# Patient Record
Sex: Male | Born: 1935 | Race: Black or African American | Hispanic: No | Smoking: Former smoker
Health system: Southern US, Community
[De-identification: ages and names within clinical notes are randomized; demographics above are authoritative.]

## PROBLEM LIST (undated history)

## (undated) DIAGNOSIS — I472 Ventricular tachycardia: Secondary | ICD-10-CM

## (undated) DIAGNOSIS — I2510 Atherosclerotic heart disease of native coronary artery without angina pectoris: Secondary | ICD-10-CM

## (undated) DIAGNOSIS — I350 Nonrheumatic aortic (valve) stenosis: Secondary | ICD-10-CM

## (undated) DIAGNOSIS — R7881 Bacteremia: Secondary | ICD-10-CM

## (undated) DIAGNOSIS — I639 Cerebral infarction, unspecified: Secondary | ICD-10-CM

## (undated) DIAGNOSIS — I82409 Acute embolism and thrombosis of unspecified deep veins of unspecified lower extremity: Secondary | ICD-10-CM

## (undated) DIAGNOSIS — E119 Type 2 diabetes mellitus without complications: Secondary | ICD-10-CM

## (undated) DIAGNOSIS — I5032 Chronic diastolic (congestive) heart failure: Secondary | ICD-10-CM

## (undated) DIAGNOSIS — J9610 Chronic respiratory failure, unspecified whether with hypoxia or hypercapnia: Secondary | ICD-10-CM

## (undated) DIAGNOSIS — I272 Pulmonary hypertension, unspecified: Secondary | ICD-10-CM

## (undated) DIAGNOSIS — J449 Chronic obstructive pulmonary disease, unspecified: Secondary | ICD-10-CM

## (undated) DIAGNOSIS — I10 Essential (primary) hypertension: Secondary | ICD-10-CM

## (undated) DIAGNOSIS — D649 Anemia, unspecified: Secondary | ICD-10-CM

## (undated) DIAGNOSIS — G4733 Obstructive sleep apnea (adult) (pediatric): Secondary | ICD-10-CM

## (undated) DIAGNOSIS — J189 Pneumonia, unspecified organism: Secondary | ICD-10-CM

## (undated) DIAGNOSIS — C61 Malignant neoplasm of prostate: Secondary | ICD-10-CM

## (undated) DIAGNOSIS — M1990 Unspecified osteoarthritis, unspecified site: Secondary | ICD-10-CM

## (undated) DIAGNOSIS — E7800 Pure hypercholesterolemia, unspecified: Secondary | ICD-10-CM

## (undated) DIAGNOSIS — R42 Dizziness and giddiness: Secondary | ICD-10-CM

## (undated) DIAGNOSIS — B9561 Methicillin susceptible Staphylococcus aureus infection as the cause of diseases classified elsewhere: Secondary | ICD-10-CM

## (undated) DIAGNOSIS — I4729 Other ventricular tachycardia: Secondary | ICD-10-CM

## (undated) DIAGNOSIS — K219 Gastro-esophageal reflux disease without esophagitis: Secondary | ICD-10-CM

## (undated) DIAGNOSIS — N182 Chronic kidney disease, stage 2 (mild): Secondary | ICD-10-CM

## (undated) HISTORY — PX: CARDIAC CATHETERIZATION: SHX172

## (undated) HISTORY — PX: COLON SURGERY: SHX602

## (undated) HISTORY — DX: Atherosclerotic heart disease of native coronary artery without angina pectoris: I25.10

## (undated) HISTORY — PX: PROSTATE SURGERY: SHX751

## (undated) HISTORY — PX: EYE SURGERY: SHX253

## (undated) HISTORY — DX: Pure hypercholesterolemia, unspecified: E78.00

## (undated) HISTORY — DX: Essential (primary) hypertension: I10

## (undated) HISTORY — DX: Malignant neoplasm of prostate: C61

## (undated) HISTORY — DX: Type 2 diabetes mellitus without complications: E11.9

## (undated) HISTORY — DX: Obstructive sleep apnea (adult) (pediatric): G47.33

---

## 1998-10-23 ENCOUNTER — Other Ambulatory Visit: Admission: RE | Admit: 1998-10-23 | Discharge: 1998-10-23 | Payer: Self-pay | Admitting: Gastroenterology

## 1999-01-31 ENCOUNTER — Encounter: Payer: Self-pay | Admitting: Emergency Medicine

## 1999-01-31 ENCOUNTER — Emergency Department (HOSPITAL_COMMUNITY): Admission: EM | Admit: 1999-01-31 | Discharge: 1999-01-31 | Payer: Self-pay | Admitting: Emergency Medicine

## 1999-03-28 ENCOUNTER — Inpatient Hospital Stay (HOSPITAL_COMMUNITY): Admission: AD | Admit: 1999-03-28 | Discharge: 1999-04-05 | Payer: Self-pay | Admitting: Cardiovascular Disease

## 1999-03-28 ENCOUNTER — Encounter: Payer: Self-pay | Admitting: Surgery

## 1999-03-29 ENCOUNTER — Encounter: Payer: Self-pay | Admitting: Surgery

## 1999-03-29 ENCOUNTER — Encounter: Payer: Self-pay | Admitting: Cardiovascular Disease

## 1999-03-29 HISTORY — PX: CORONARY ARTERY BYPASS GRAFT: SHX141

## 1999-03-30 ENCOUNTER — Encounter: Payer: Self-pay | Admitting: Cardiovascular Disease

## 1999-03-31 ENCOUNTER — Encounter: Payer: Self-pay | Admitting: Surgery

## 1999-04-12 ENCOUNTER — Encounter: Admission: RE | Admit: 1999-04-12 | Discharge: 1999-07-11 | Payer: Self-pay | Admitting: Pulmonary Disease

## 1999-05-01 ENCOUNTER — Encounter (HOSPITAL_COMMUNITY): Admission: RE | Admit: 1999-05-01 | Discharge: 1999-07-23 | Payer: Self-pay | Admitting: Cardiovascular Disease

## 1999-07-24 ENCOUNTER — Encounter (HOSPITAL_COMMUNITY): Admission: RE | Admit: 1999-07-24 | Discharge: 1999-10-22 | Payer: Self-pay | Admitting: Cardiovascular Disease

## 2001-01-21 ENCOUNTER — Encounter: Payer: Self-pay | Admitting: Urology

## 2001-01-21 ENCOUNTER — Ambulatory Visit (HOSPITAL_COMMUNITY): Admission: RE | Admit: 2001-01-21 | Discharge: 2001-01-21 | Payer: Self-pay | Admitting: Urology

## 2001-02-26 ENCOUNTER — Ambulatory Visit (HOSPITAL_COMMUNITY): Admission: RE | Admit: 2001-02-26 | Discharge: 2001-02-26 | Payer: Self-pay | Admitting: Neurology

## 2001-02-26 ENCOUNTER — Encounter: Payer: Self-pay | Admitting: Neurology

## 2002-03-31 ENCOUNTER — Encounter: Payer: Self-pay | Admitting: Chiropractic Medicine

## 2002-03-31 ENCOUNTER — Encounter: Admission: RE | Admit: 2002-03-31 | Discharge: 2002-03-31 | Payer: Self-pay | Admitting: Chiropractic Medicine

## 2002-04-06 ENCOUNTER — Encounter: Payer: Self-pay | Admitting: Chiropractic Medicine

## 2002-04-06 ENCOUNTER — Encounter: Admission: RE | Admit: 2002-04-06 | Discharge: 2002-04-06 | Payer: Self-pay | Admitting: Chiropractic Medicine

## 2003-09-05 ENCOUNTER — Encounter: Payer: Self-pay | Admitting: Chiropractic Medicine

## 2003-09-05 ENCOUNTER — Encounter: Admission: RE | Admit: 2003-09-05 | Discharge: 2003-09-05 | Payer: Self-pay | Admitting: Chiropractic Medicine

## 2006-05-06 ENCOUNTER — Ambulatory Visit: Payer: Self-pay | Admitting: Gastroenterology

## 2006-05-31 ENCOUNTER — Emergency Department (HOSPITAL_COMMUNITY): Admission: EM | Admit: 2006-05-31 | Discharge: 2006-05-31 | Payer: Self-pay

## 2006-06-04 ENCOUNTER — Ambulatory Visit: Payer: Self-pay | Admitting: Gastroenterology

## 2006-06-11 ENCOUNTER — Ambulatory Visit: Payer: Self-pay | Admitting: Gastroenterology

## 2008-08-16 ENCOUNTER — Encounter: Admission: RE | Admit: 2008-08-16 | Discharge: 2008-08-16 | Payer: Self-pay | Admitting: Orthopedic Surgery

## 2008-08-18 ENCOUNTER — Ambulatory Visit (HOSPITAL_BASED_OUTPATIENT_CLINIC_OR_DEPARTMENT_OTHER): Admission: RE | Admit: 2008-08-18 | Discharge: 2008-08-18 | Payer: Self-pay | Admitting: Orthopedic Surgery

## 2008-08-18 ENCOUNTER — Encounter (INDEPENDENT_AMBULATORY_CARE_PROVIDER_SITE_OTHER): Payer: Self-pay | Admitting: Orthopedic Surgery

## 2009-07-21 ENCOUNTER — Ambulatory Visit (HOSPITAL_BASED_OUTPATIENT_CLINIC_OR_DEPARTMENT_OTHER): Admission: RE | Admit: 2009-07-21 | Discharge: 2009-07-21 | Payer: Self-pay | Admitting: Orthopedic Surgery

## 2011-03-31 LAB — POCT I-STAT, CHEM 8
BUN: 20 mg/dL (ref 6–23)
Calcium, Ion: 1.2 mmol/L (ref 1.12–1.32)
Chloride: 104 mEq/L (ref 96–112)
Creatinine, Ser: 1.1 mg/dL (ref 0.4–1.5)
Glucose, Bld: 150 mg/dL — ABNORMAL HIGH (ref 70–99)
HCT: 35 % — ABNORMAL LOW (ref 39.0–52.0)
Hemoglobin: 11.9 g/dL — ABNORMAL LOW (ref 13.0–17.0)
Potassium: 4.3 mEq/L (ref 3.5–5.1)
Sodium: 139 mEq/L (ref 135–145)
TCO2: 27 mmol/L (ref 0–100)

## 2011-03-31 LAB — GLUCOSE, CAPILLARY
Glucose-Capillary: 142 mg/dL — ABNORMAL HIGH (ref 70–99)
Glucose-Capillary: 62 mg/dL — ABNORMAL LOW (ref 70–99)
Glucose-Capillary: 78 mg/dL (ref 70–99)

## 2011-03-31 LAB — ANAEROBIC CULTURE: Gram Stain: NONE SEEN

## 2011-03-31 LAB — WOUND CULTURE: Gram Stain: NONE SEEN

## 2011-03-31 LAB — POCT HEMOGLOBIN-HEMACUE: Hemoglobin: 10.4 g/dL — ABNORMAL LOW (ref 13.0–17.0)

## 2011-05-07 NOTE — Op Note (Signed)
NAMEKAYODE, PETION              ACCOUNT NO.:  [id number]   MEDICAL RECORD NO.:  [id number]          PATIENT TYPE:  AMB   LOCATION:  DSC                          FACILITY:  [facility]   PHYSICIAN:  Cindee Salt, M.D.       DATE OF BIRTH:  [birth year]   DATE OF PROCEDURE:  07/21/2009  DATE OF DISCHARGE:                               OPERATIVE REPORT   PREOPERATIVE DIAGNOSIS:  Foreign body, possible infection, proximal  interphalangeal joint of right index finger.   POSTOPERATIVE DIAGNOSIS:  Foreign body, possible infection, proximal  interphalangeal joint of right index finger.   OPERATION:  Removal of foreign body proximal interphalangeal joint right  index finger.   SURGEON:  Cindee Salt, MD   ASSISTANT:  Carolyne Fiscal R.N.   ANESTHESIA:  MAC.   ANESTHESIOLOGIST:  Dr. Jean Rosenthal.   HISTORY:  The patient is a 77 year old male with a history of a foreign  body piece of wood from a weed in proximal interphalangeal area of his  right index finger.  This has been swollen.  He has attempted to remove  it.  He states that he has removed some of the foreign material, it is  not certain whether all of it was relieved.  He has brought to the  operating room for the possibility of removal of foreign body, incision  and drainage of the PIP joint dictated by depth of the wound.  He is  aware of risks and complications including infection, injury to  arteries, nerves, tendons, and stiffness to the joint.  Preoperative  area the patient is seen.  The extremity marked by both the patient and  surgeon.   PROCEDURE:  The patient was brought to the operating room where a local  anesthetic MAC was given with 0.25% Marcaine, 1% Xylocaine both without  epinephrine.  He was prepped using ChloraPrep, supine position with the  right arm free.  A Penrose drain was used for tourniquet control after  draping and time-out taken and 3-minute dry time.  The incision was made  directly over the entrance wound and carried  down through subcutaneous  tissue.  A large piece of foreign material was immediately encountered  appearing to be a thorn.  This was removed followed down into the PIP  joint.  This area was opened.  Cultures taken for both aerobic and  anaerobic cultures.  The joint copiously irrigated with saline.  A  Penrose drain placed.  A sterile compressive dressing and splint was  applied to  the finger.  The patient tolerated the procedure well.  On removal of  the Penrose, the tip of the finger immediately pinked.  He was taken to  the recovery room for observation in satisfactory condition.  He will be  discharged home to return to the [facility] of [place] in 4 days on  Vicodin and Septra DS.           ______________________________  Cindee Salt, M.D.     GK/MEDQ  D:  07/21/2009  T:  07/22/2009  Job:  [id number]

## 2011-05-07 NOTE — Op Note (Signed)
[name], Douglas Carrillo              ACCOUNT NO.:  [id number]   MEDICAL RECORD NO.:  [id number]          PATIENT TYPE:  AMB   LOCATION:  DSC                          FACILITY:  [facility]   PHYSICIAN:  Cindee Salt, M.D.       DATE OF BIRTH:  Feb 07, 1928   DATE OF PROCEDURE:  08/18/2008  DATE OF DISCHARGE:                               OPERATIVE REPORT   PREOPERATIVE DIAGNOSIS:  Stenosing tenosynovitis, left thumb with flexor  sheath cyst.   POSTOPERATIVE DIAGNOSIS:  Stenosing tenosynovitis, left thumb with  flexor sheath cyst.   OPERATION:  Release A1 pulley with excision of flexor sheath cyst, left  thumb.   SURGEON:  Cindee Salt, MD   ASSISTANT:  Carolyne Fiscal, RN   ANESTHESIA:  Forearm-based IV regional.   HISTORY:  The patient is a 77 year old male with a history of triggering  of his left thumb.  This has developed a cyst.  He is desirous of having  this surgically released and excised.  He is aware of risks and  complications including infection, recurrence, injury to arteries,  nerves, tendons, and incomplete relief of symptoms, and dystrophy.  In  the preoperative area, the patient is seen.  The extremity marked by  both the patient and surgeon.  Antibiotic given.  Questions encouraged  and answered.   PROCEDURE:  The patient is brought to the operating room where a forearm-  based IV regional anesthetic was carried out without difficulty and was  prepped using the DuraPrep in supine position and left arm free under  the direction of Dr. Sampson Goon.  A time-out was taken.  A transverse  incision was made over the A1 pulley of the left thumb and carried down  through the subcutaneous tissue.  Bleeders were electrocauterized.  Neurovascular structures were identified and protected and retractors  placed.  A cyst was immediately encountered at the proximal aspect of  the A1 pulley.  This was excised and sent to pathology with small  portion of the pulley.  Pulley was then released on  the radial aspect  protecting the oblique pulley.  The thumb placed through a full range  motion and no further triggering was evident.  The wound was irrigated  and skin closed with interrupted 5-0 Vicryl Rapide suture.  Sterile  compressive dressing was applied.  The patient tolerated the procedure  well and was taken to the recovery room for observation in satisfactory  condition.  He will be discharged home to return to the [facility] of  [place] in approximately 1 week on Vicodin.           ______________________________  Cindee Salt, M.D.     GK/MEDQ  D:  08/18/2008  T:  08/19/2008  Job:  [id number]   cc:   Veverly Fells. Al[place]imer, M.D.

## 2012-04-28 HISTORY — PX: NM MYOCAR PERF WALL MOTION: HXRAD629

## 2012-10-29 ENCOUNTER — Ambulatory Visit: Payer: Medicare Other | Attending: Otolaryngology | Admitting: Rehabilitative and Restorative Service Providers"

## 2012-10-29 DIAGNOSIS — IMO0001 Reserved for inherently not codable concepts without codable children: Secondary | ICD-10-CM | POA: Insufficient documentation

## 2012-10-29 DIAGNOSIS — R42 Dizziness and giddiness: Secondary | ICD-10-CM | POA: Insufficient documentation

## 2012-11-02 ENCOUNTER — Ambulatory Visit: Payer: Medicare Other | Admitting: Rehabilitative and Restorative Service Providers"

## 2012-11-04 ENCOUNTER — Ambulatory Visit: Payer: Medicare Other | Admitting: Rehabilitative and Restorative Service Providers"

## 2012-11-04 ENCOUNTER — Encounter: Payer: TRICARE For Life (TFL) | Admitting: Rehabilitative and Restorative Service Providers"

## 2012-11-11 ENCOUNTER — Ambulatory Visit: Payer: Medicare Other | Admitting: Rehabilitative and Restorative Service Providers"

## 2012-11-13 ENCOUNTER — Encounter: Payer: TRICARE For Life (TFL) | Admitting: Rehabilitative and Restorative Service Providers"

## 2013-04-16 ENCOUNTER — Other Ambulatory Visit (HOSPITAL_COMMUNITY): Payer: Self-pay | Admitting: Cardiovascular Disease

## 2013-04-16 DIAGNOSIS — I350 Nonrheumatic aortic (valve) stenosis: Secondary | ICD-10-CM

## 2013-05-04 ENCOUNTER — Ambulatory Visit (HOSPITAL_COMMUNITY)
Admission: RE | Admit: 2013-05-04 | Discharge: 2013-05-04 | Disposition: A | Discharge status: Home / Self Care | Payer: Medicare Other | Source: Ambulatory Visit | Attending: Internal Medicine | Admitting: Internal Medicine

## 2013-05-04 DIAGNOSIS — I359 Nonrheumatic aortic valve disorder, unspecified: Secondary | ICD-10-CM | POA: Insufficient documentation

## 2013-05-04 DIAGNOSIS — I350 Nonrheumatic aortic (valve) stenosis: Secondary | ICD-10-CM

## 2013-05-04 NOTE — Progress Notes (Signed)
2D Echo Performed 05/04/2013    Clearence Ped, RCS

## 2013-06-09 ENCOUNTER — Other Ambulatory Visit: Payer: Self-pay | Admitting: Pharmacist Clinician (PhC)/ Clinical Pharmacy Specialist

## 2013-06-09 MED ORDER — ISOSORBIDE MONONITRATE ER 60 MG PO TB24: ORAL_TABLET | ORAL | Status: DC

## 2013-06-09 MED ORDER — METOPROLOL SUCCINATE ER 100 MG PO TB24: ORAL_TABLET | ORAL | Status: DC

## 2013-07-07 ENCOUNTER — Other Ambulatory Visit (HOSPITAL_COMMUNITY): Payer: Self-pay | Admitting: Cardiovascular Disease

## 2013-07-08 NOTE — Telephone Encounter (Signed)
Rx was sent to pharmacy electronically. 

## 2013-07-09 ENCOUNTER — Telehealth: Payer: Self-pay | Admitting: Cardiovascular Disease

## 2013-07-09 NOTE — Telephone Encounter (Signed)
Needs refill on Lasix----needs 90 day supply with 3 refills  [facility].-------PATIENT STATES THAT HE HAS NOT HEARD FROM HIS ECHO THAT WAS DONE ON 05/04/2013.

## 2013-07-09 NOTE — Telephone Encounter (Signed)
Please review echo

## 2013-07-09 NOTE — Telephone Encounter (Signed)
Returned call.  Pt informed refill completed yesterday evening.  Stated he received a call earlier yesterday.  Pt also informed Dr. Tresa Endo will be notified that he has not received his results.  Pt verbalized understanding and agreed w/ plan.  Stated Burna Mortimer usually calls him.    Message forwarded to Dr. Pierre Bali, CMA.

## 2013-07-12 NOTE — Telephone Encounter (Signed)
Nl lvFXN; mild diastolic dysfunction; at least mod AS,  Med RX ;  Important to note if any change in Symptoms

## 2013-11-12 ENCOUNTER — Other Ambulatory Visit: Payer: Self-pay | Admitting: Cardiovascular Disease

## 2013-11-12 NOTE — Telephone Encounter (Signed)
Rx was sent to pharmacy electronically. 

## 2013-11-27 ENCOUNTER — Other Ambulatory Visit (HOSPITAL_COMMUNITY): Payer: Self-pay | Admitting: Cardiovascular Disease

## 2013-11-30 ENCOUNTER — Ambulatory Visit (INDEPENDENT_AMBULATORY_CARE_PROVIDER_SITE_OTHER): Payer: Medicare Other | Admitting: Cardiovascular Disease

## 2013-11-30 ENCOUNTER — Encounter: Payer: Self-pay | Admitting: Cardiovascular Disease

## 2013-11-30 VITALS — BP 110/64 | HR 77 | Ht 72.0 in | Wt 212.2 lb

## 2013-11-30 DIAGNOSIS — E119 Type 2 diabetes mellitus without complications: Secondary | ICD-10-CM

## 2013-11-30 DIAGNOSIS — I10 Essential (primary) hypertension: Secondary | ICD-10-CM

## 2013-11-30 DIAGNOSIS — I359 Nonrheumatic aortic valve disorder, unspecified: Secondary | ICD-10-CM

## 2013-11-30 DIAGNOSIS — I350 Nonrheumatic aortic (valve) stenosis: Secondary | ICD-10-CM

## 2013-11-30 DIAGNOSIS — I25810 Atherosclerosis of coronary artery bypass graft(s) without angina pectoris: Secondary | ICD-10-CM

## 2013-11-30 DIAGNOSIS — E785 Hyperlipidemia, unspecified: Secondary | ICD-10-CM

## 2013-11-30 DIAGNOSIS — I2510 Atherosclerotic heart disease of native coronary artery without angina pectoris: Secondary | ICD-10-CM

## 2013-11-30 DIAGNOSIS — I4510 Unspecified right bundle-branch block: Secondary | ICD-10-CM

## 2013-11-30 MED ORDER — CLOPIDOGREL BISULFATE 75 MG PO TABS: ORAL_TABLET | ORAL | Status: DC

## 2013-11-30 MED ORDER — FUROSEMIDE 40 MG PO TABS: ORAL_TABLET | ORAL | Status: DC

## 2013-11-30 NOTE — Progress Notes (Signed)
Patient ID: Douglas Carrillo, male   DOB: 20-Nov-1928, 79 y.o.   MRN: [id number]     HPI: [name] is a 79 y.o. male who presents to the office today for one-year cardiology evaluation.  [name] has established coronary artery disease in April 2000 underwent CABG surgery by Dr. Lavinia Sharps with a LIMA to the LAD, vein to the vaginal, a vein to the intermediate, and vein to the PDA. In January 2007 cardiac catheterization was done due to exertional dyspnea and mild abnormality on nuclear imaging. This revealed patent vein grafts to the diagonal, marginal, and PDA, but he had an atretic LIMA graft to the LAD with evidence for 60% narrowing in the proximal left subclavian. He also developed moderate aortic stenosis. His last nuclear perfusion study in May 2013 showed normal perfusion. An echo Doppler study in April 2013 showed mild LVH with normal systolic function. Moderate calcification of the trileaflet aortic valve with a mean gradient of 23 and maximum gradient of 45 with an estimated aortic valve area of 1.1 cm consistent with moderate valvular aortic stenosis. He did have mild to moderate aortic insufficiency. A one-year followup echo Doppler study done in May 2014 again showed normal systolic function. There was grade 1 diastolic dysfunction. He was felt to have moderately to severe aortic stenosis with a mean gradient of 22 peak gradient of 41 and based on an LVOT diameter of 2.0 cm the calculated aortic valve area was approximately 1.0 cm. There is mild AR. He did have heavy calcification of the mitral annulus with mild MR with very mild stenosis with a mean gradient of 4 mm. Right ventricle cavity was mildly dilated.  Mr. Vanvranken sees Dr. Kyra Searles for his primary care. Additional problems include diabetes mellitus for which he is on metformin, but those are, Actos, as well as glipizide. His hyperlipidemia on Crestor and Zetia. Has history of hypertension currently treated with amlodipine,  furosemide 40 mg, losartan HCTZ, metoprolol succinate 100 mg, in addition to his isosorbide. He has been documented to have chronic right bundle branch block.  Over the past year, [name] has remained stable. He specifically denies chest pain. He denies presyncope or syncope. He denies CHF symptoms.  He recently had laboratory work done by Dr. Kyra Searles and I reviewed these in detail the cholesterol is excellent at 120 with triglycerides 59 HDL 45 LDL 63. Hemoglobin A1c was 6.8. And normal renal function with creatinine of 1.35. LFTs were normal.  History reviewed. No pertinent past medical history.  History reviewed. No pertinent past surgical history.  No Known Allergies  Current Outpatient Prescriptions  Medication Sig Dispense Refill  . amLODipine (NORVASC) 5 MG tablet Take 1 tablet by mouth daily.      Marland Kitchen aspirin EC 325 MG tablet Take 325 mg by mouth daily.      Marland Kitchen b complex vitamins capsule Take 1 capsule by mouth daily.      . clopidogrel (PLAVIX) 75 MG tablet TAKE 1 TABLET DAILY  90 tablet  0  . CRESTOR 20 MG tablet Take 1 tablet by mouth daily.      . furosemide (LASIX) 40 MG tablet 1 tablet daily      . isosorbide mononitrate (IMDUR) 60 MG 24 hr tablet Take 1&1/2 tablets by mouth daily.  135 tablet  1  . ketotifen (THERA TEARS ALLERGY) 0.025 % ophthalmic solution 1 drop 2 (two) times daily.      Marland Kitchen KLOR-CON M20 20 MEQ tablet Take 1 tablet by  mouth 3 (three) times daily.      Marland Kitchen latanoprost (XALATAN) 0.005 % ophthalmic solution Place 1 drop into both eyes at bedtime.      Marland Kitchen losartan-hydrochlorothiazide (HYZAAR) 100-25 MG per tablet TAKE 1 TABLET DAILY  90 tablet  0  . meclizine (ANTIVERT) 25 MG tablet Take 25 mg by mouth as needed for dizziness.      . metFORMIN (GLUCOPHAGE-XR) 500 MG 24 hr tablet Take 1 tablet by mouth 2 (two) times daily.      . metoprolol succinate (TOPROL-XL) 100 MG 24 hr tablet TAKE 1 TABLET EACH MORNING AND TAKE 1/2 TABLET NIGHTLYAT BEDTIME  135 tablet  0    . Multiple Vitamins-Minerals (CENTRUM SILVER ULTRA MENS PO) Take 1 tablet by mouth daily.      . pioglitazone (ACTOS) 45 MG tablet Take 1 tablet by mouth daily.      . RESTASIS 0.05 % ophthalmic emulsion Place 1 drop into both eyes 2 (two) times daily.      Marland Kitchen VICTOZA 18 MG/3ML SOPN Inject 1.8 mg as directed daily.      . vitamin C (ASCORBIC ACID) 500 MG tablet Take 500 mg by mouth 2 (two) times daily.      . Vitamin D, Ergocalciferol, (DRISDOL) 50000 UNITS CAPS capsule Take 1 capsule by mouth. Every other week      . vitamin E 400 UNIT capsule Take 400 Units by mouth daily.      Marland Kitchen ZETIA 10 MG tablet Take 1 tablet by mouth daily.      Marland Kitchen GLIPIZIDE XL 10 MG 24 hr tablet Take 1 tablet by mouth daily.       No current facility-administered medications for this visit.    History   Social History  . Marital Status: Widowed    Spouse Name: N/A    Number of Children: N/A  . Years of Education: N/A   Occupational History  . Not on file.   Social History Main Topics  . Smoking status: Former Smoker    Types: Cigarettes  . Smokeless tobacco: Never Used     Comment: quit smoking in 1985  . Alcohol Use: Yes     Comment: rarely  . Drug Use: Not on file  . Sexual Activity: Not on file   Other Topics Concern  . Not on file   Social History Narrative  . No narrative on file   Social history is notable that he is widowed. His daughter lives block away. He has one child and 2 grandchildren. He goes to the gym 4 days a week to exercise. History reviewed. No pertinent family history.  ROS is negative for fevers, chills or night sweats.   Other comprehensive 12 point system review is negative.  PE BP 110/64  Pulse 77  Ht 6' (1.829 m)  Wt 96.253 kg (212 lb 3.2 oz)  BMI 28.77 kg/m2  General: Alert, oriented, no distress.  Skin: normal turgor, no rashes HEENT: Normocephalic, atraumatic. Pupils round and reactive; sclera anicteric;no lid lag.  Nose without nasal septal  hypertrophy Mouth/Parynx benign; Mallinpatti scale 2 Neck: No JVD, no carotid briuts Lungs: clear to ausculatation and percussion; no wheezing or rales Heart: RRR, s1 s2 normal 2/6 aortic stenosis murmur with 1/6 aortic insufficiency; no S3 gallop Abdomen: soft, nontender; no hepatosplenomehaly, BS+; abdominal aorta nontender and not dilated by palpation. Pulses 2+ Extremities: no clubbing cyanosis or edema, Homan's sign negative  Neurologic: grossly nonfocal Psychologic: normal affect and mood.  ECG: Sinus  rhythm at 77 beats per minute with right bundle branch block and associated repolarization changes. Borderline first-degree AV block with a PR interval of 206 ms. QTC of 454 ms.  LABS:  BMET    Component Value Date/Time   NA 139 07/20/2009 1616   K 4.3 07/20/2009 1616   CL 104 07/20/2009 1616   GLUCOSE 150* 07/20/2009 1616   BUN 20 07/20/2009 1616   CREATININE 1.1 07/20/2009 1616     Hepatic Function Panel  No results found for this basename: prot, albumin, ast, alt, alkphos, bilitot, bilidir, ibili     CBC    Component Value Date/Time   HGB 10.4* 07/21/2009 1313   HCT 35.0* 07/20/2009 1616     BNP No results found for this basename: probnp    Lipid Panel  No results found for this basename: chol, trig, hdl, cholhdl, vldl, ldlcalc     RADIOLOGY: No results found.    ASSESSMENT AND PLAN: My impression is that Mr. Schlender is now 79 years old and continues to do fairly well. In April 2000 he will be 17 years following his CABG revascularization surgery. He has been documented to have an atretic LIMA graft but otherwise patent vein grafts. He also has developed moderate to moderately severe aortic valve stenosis. His blood pressure is well controlled. His diabetes is being managed by Dr. Kyra Searles. His lipids are excellent. He is not having any signs of peripheral edema presently. In May 2015, I am recommending he undergo one year followup echo Doppler study to reassess  his aortic valve stenosis. I will see him back in the office in followup of that evaluation and further recommendations will be made at that time.     Lennette Bihari, MD, [facility]  11/30/2013 8:34 AM

## 2013-11-30 NOTE — Patient Instructions (Signed)
Your physician recommends that you schedule a follow-up appointment in: 6 months  Your physician has requested that you have an echocardiogram. Echocardiography is a painless test that uses sound waves to create images of your heart. It provides your doctor with information about the size and shape of your heart and how well your heart's chambers and valves are working. This procedure takes approximately one hour. There are no restrictions for this procedure. 6 months

## 2013-12-02 ENCOUNTER — Encounter: Payer: Self-pay | Admitting: Cardiovascular Disease

## 2014-01-12 ENCOUNTER — Telehealth: Payer: Self-pay | Admitting: Cardiovascular Disease

## 2014-01-12 NOTE — Telephone Encounter (Signed)
Pt says his mail order pharmacy still have not received his script for his Norvasc. Please take care of this today if possble.

## 2014-01-13 MED ORDER — AMLODIPINE BESYLATE 5 MG PO TABS: 5.0000 mg | ORAL_TABLET | Freq: Every day | ORAL | Status: DC

## 2014-01-13 NOTE — Telephone Encounter (Signed)
Done.  Rx was sent to pharmacy electronically.  

## 2014-01-14 ENCOUNTER — Other Ambulatory Visit: Payer: Self-pay | Admitting: Cardiovascular Disease

## 2014-01-14 NOTE — Telephone Encounter (Signed)
Rx was sent to pharmacy electronically. 

## 2014-01-15 ENCOUNTER — Other Ambulatory Visit: Payer: Self-pay | Admitting: Cardiovascular Disease

## 2014-01-17 NOTE — Telephone Encounter (Signed)
Rx was sent to pharmacy electronically. 

## 2014-02-05 ENCOUNTER — Other Ambulatory Visit: Payer: Self-pay | Admitting: Cardiovascular Disease

## 2014-02-07 NOTE — Telephone Encounter (Signed)
Rx was sent to pharmacy electronically. 

## 2014-03-24 ENCOUNTER — Telehealth: Payer: Self-pay | Admitting: *Deleted

## 2014-03-24 MED ORDER — ISOSORBIDE MONONITRATE ER 60 MG PO TB24: 90.0000 mg | ORAL_TABLET | Freq: Every day | ORAL | Status: DC

## 2014-03-24 MED ORDER — ISOSORBIDE MONONITRATE ER 60 MG PO TB24: ORAL_TABLET | ORAL | Status: DC

## 2014-03-24 NOTE — Telephone Encounter (Signed)
Pt was calling in regards to his Isosorbide. He stated that he talked to [place] on Monday and she stated that she would send a note back to get this taken care of. He still has not heard back from anyone yet. He is completely out of his medication. He needs a 10 day supply from the local pharmacy because he usually gets it mail ordered.  TK

## 2014-03-24 NOTE — Telephone Encounter (Signed)
Returned call and pt verified x 2.  Pt informed message received today and no message was received on Monday, but RN will send in refill now.  Pt verbalized understanding and agreed w/ plan.  Refill(s) sent to pharmacy: [facility]; [facility] 10[facility]day supply.

## 2014-05-02 ENCOUNTER — Other Ambulatory Visit: Payer: Self-pay | Admitting: Endocrinology

## 2014-05-02 ENCOUNTER — Ambulatory Visit
Admission: RE | Admit: 2014-05-02 | Discharge: 2014-05-02 | Disposition: A | Discharge status: Home / Self Care | Payer: Medicare Other | Source: Ambulatory Visit | Attending: Endocrinology | Admitting: Endocrinology

## 2014-05-02 DIAGNOSIS — R05 Cough: Secondary | ICD-10-CM

## 2014-05-02 DIAGNOSIS — R059 Cough, unspecified: Secondary | ICD-10-CM

## 2014-05-27 ENCOUNTER — Encounter: Payer: Self-pay | Admitting: Cardiovascular Disease

## 2014-05-27 ENCOUNTER — Ambulatory Visit (INDEPENDENT_AMBULATORY_CARE_PROVIDER_SITE_OTHER): Payer: Medicare Other | Admitting: Cardiovascular Disease

## 2014-05-27 VITALS — BP 132/68 | HR 71 | Ht 72.0 in | Wt 212.2 lb

## 2014-05-27 DIAGNOSIS — E785 Hyperlipidemia, unspecified: Secondary | ICD-10-CM

## 2014-05-27 DIAGNOSIS — I25810 Atherosclerosis of coronary artery bypass graft(s) without angina pectoris: Secondary | ICD-10-CM

## 2014-05-27 DIAGNOSIS — I4510 Unspecified right bundle-branch block: Secondary | ICD-10-CM

## 2014-05-27 DIAGNOSIS — I10 Essential (primary) hypertension: Secondary | ICD-10-CM

## 2014-05-27 DIAGNOSIS — I350 Nonrheumatic aortic (valve) stenosis: Secondary | ICD-10-CM

## 2014-05-27 DIAGNOSIS — I359 Nonrheumatic aortic valve disorder, unspecified: Secondary | ICD-10-CM

## 2014-05-27 DIAGNOSIS — E119 Type 2 diabetes mellitus without complications: Secondary | ICD-10-CM

## 2014-05-27 DIAGNOSIS — I2510 Atherosclerotic heart disease of native coronary artery without angina pectoris: Secondary | ICD-10-CM

## 2014-05-27 NOTE — Progress Notes (Signed)
Patient ID: Douglas Carrillo, male   DOB: September 08, 1928, 80 y.o.   MRN: [id number]      HPI: Douglas Carrillo is a 80 y.o. male who presents to the office today for a 6 month cardiology evaluation.  Douglas Carrillo has established coronary artery disease in April 2000 underwent CABG surgery by Dr. Cyndia Bent  (LIMA to the LAD, vein to the vaginal, a vein to the intermediate, and vein to the PDA). In January 2007 cardiac catheterization was done due to exertional dyspnea and mild abnormality on nuclear imaging. This revealed patent vein grafts to the diagonal, marginal, and PDA, but he had an atretic LIMA graft to the LAD with evidence for 60% narrowing in the proximal left subclavian. He also developed moderate aortic stenosis. His last nuclear perfusion study in May 2013 showed normal perfusion. An echo Doppler study in April 2013 showed mild LVH with normal systolic function. Moderate calcification of the trileaflet aortic valve with a mean gradient of 23 and maximum gradient of 45 with an estimated aortic valve area of 1.1 cm consistent with moderate valvular aortic stenosis. He did have mild to moderate aortic insufficiency. A one-year followup echo Doppler study done in May 2014 again showed normal systolic function. There was grade 1 diastolic dysfunction. He was felt to have moderately to severe aortic stenosis with a mean gradient of 22 peak gradient of 41 and based on an LVOT diameter of 2.0 cm the calculated aortic valve area was approximately 1.0 cm. There is mild AR. He did have heavy calcification of the mitral annulus with mild MR with very mild stenosis with a mean gradient of 4 mm. Right ventricle cavity was mildly dilated.  Douglas Carrillo sees Dr. Eden Emms for his primary care. Additional problems include diabetes mellitus for which he is on metformin, Victoza, Actos, as well as glipizide. His hyperlipidemia on Crestor and Zetia. Has history of hypertension currently treated with amlodipine, furosemide 40  mg, losartan HCTZ, metoprolol succinate 100 mg, in addition to his isosorbide. He has been documented to have chronic right bundle branch block.  Over the 6 months, [name] has remained stable. He specifically denies chest pain. He denies presyncope or syncope. He denies CHF symptoms.  There is some very mild shortness of breath with activity, but he believes this has not changed at all for the past year.  He is unaware of any palpitations.  He recently saw Dr. Eden Emms.  Laboratory was done 3 days ago.  These were reviewed by me in detail.  Normal renal function with creatinine 1.22.  Cholesterol was 125, triglycerides 91, HDL 39, LDL cholesterol 68.  Hemoglobin A1c was 7.0.  l.  History reviewed. No pertinent past medical history.  History reviewed. No pertinent past surgical history.  Allergies  Allergen Reactions  . Mold Extract [Trichophyton] Anaphylaxis    Current Outpatient Prescriptions  Medication Sig Dispense Refill  . amLODipine (NORVASC) 5 MG tablet Take 1 tablet (5 mg total) by mouth daily.  90 tablet  3  . aspirin EC 325 MG tablet Take 325 mg by mouth daily.      Marland Kitchen b complex vitamins capsule Take 1 capsule by mouth daily.      . clopidogrel (PLAVIX) 75 MG tablet TAKE 1 TABLET DAILY  90 tablet  3  . CRESTOR 20 MG tablet Take 1 tablet by mouth daily.      Marland Kitchen EPIPEN 2-PAK 0.3 MG/0.3ML SOAJ injection       . furosemide (LASIX) 40 MG tablet  1 tablet daily  90 tablet  3  . GLIPIZIDE XL 10 MG 24 hr tablet Take 1 tablet by mouth daily.      . isosorbide mononitrate (IMDUR) 60 MG 24 hr tablet Take 1&1/2 tablets by mouth daily.  135 tablet  2  . ketotifen (THERA TEARS ALLERGY) 0.025 % ophthalmic solution 1 drop 2 (two) times daily.      Marland Kitchen KLOR-CON M20 20 MEQ tablet TAKE 3 TABLETS DAILY  270 tablet  3  . latanoprost (XALATAN) 0.005 % ophthalmic solution Place 1 drop into both eyes at bedtime.      Marland Kitchen losartan-hydrochlorothiazide (HYZAAR) 100-25 MG per tablet TAKE 1 TABLET DAILY   90 tablet  3  . meclizine (ANTIVERT) 25 MG tablet Take 25 mg by mouth as needed for dizziness.      . metFORMIN (GLUCOPHAGE-XR) 500 MG 24 hr tablet Take 1 tablet by mouth 2 (two) times daily.      . metoprolol succinate (TOPROL-XL) 100 MG 24 hr tablet Take 1 tablet (100mg ) in the morning. Take 1/2 tablet (50mg ) at bedtime.  135 tablet  3  . Multiple Vitamins-Minerals (CENTRUM SILVER ULTRA MENS PO) Take 1 tablet by mouth daily.      . pioglitazone (ACTOS) 45 MG tablet Take 1 tablet by mouth daily.      . RESTASIS 0.05 % ophthalmic emulsion Place 1 drop into both eyes 2 (two) times daily.      Marland Kitchen VICTOZA 18 MG/3ML SOPN Inject 1.8 mg as directed daily.      . vitamin C (ASCORBIC ACID) 500 MG tablet Take 500 mg by mouth 2 (two) times daily.      . Vitamin D, Ergocalciferol, (DRISDOL) 50000 UNITS CAPS capsule Take 1 capsule by mouth. Every other week      . vitamin E 400 UNIT capsule Take 400 Units by mouth daily.      Marland Kitchen ZETIA 10 MG tablet Take 1 tablet by mouth daily.       No current facility-administered medications for this visit.    History   Social History  . Marital Status: Widowed    Spouse Name: N/A    Number of Children: N/A  . Years of Education: N/A   Occupational History  . Not on file.   Social History Main Topics  . Smoking status: Former Smoker    Types: Cigarettes  . Smokeless tobacco: Never Used     Comment: quit smoking in 1985  . Alcohol Use: Yes     Comment: rarely  . Drug Use: Not on file  . Sexual Activity: Not on file   Other Topics Concern  . Not on file   Social History Narrative  . No narrative on file   Social history is notable that he is widowed. His daughter lives block away. He has one child and 2 grandchildren. He goes to the gym 4 days a week to exercise. History reviewed. No pertinent family history.  ROS General: Negative; No fevers, chills, or night sweats;  HEENT: Negative; No changes in vision or hearing, sinus congestion, difficulty  swallowing Pulmonary: Negative; No cough, wheezing, shortness of breath, hemoptysis Cardiovascular: See history of present illness No chest pain, presyncope, syncope, palpatations At times, very mild lower extremity edema GI: Negative; No nausea, vomiting, diarrhea, or abdominal pain GU: Negative; No dysuria, hematuria, or difficulty voiding Musculoskeletal: Negative; no myalgias, joint pain, or weakness Hematologic/Oncology: Negative; no easy bruising, bleeding Endocrine: Positive for type; no heat/cold intolerance Neuro:  Negative; no changes in balance, headaches Skin: Negative; No rashes or skin lesions Psychiatric: Negative; No behavioral problems, depression Sleep: Negative; No snoring, daytime sleepiness, hypersomnolence, bruxism, restless legs, hypnogognic hallucinations, no cataplexy Other comprehensive 14 point system review is negative  PE BP 132/68  Pulse 71  Ht 6' (1.829 m)  Wt 212 lb 3.2 oz (96.253 kg)  BMI 28.77 kg/m2  General: Alert, oriented, no distress.  Skin: normal turgor, no rashes HEENT: Normocephalic, atraumatic. Pupils round and reactive; sclera anicteric;no lid lag.  Nose without nasal septal hypertrophy Mouth/Parynx benign; Mallinpatti scale 2 Neck: No JVD, transmitted murmur to his carotids Lungs: clear to ausculatation and percussion; no wheezing or rales Chest wall: Nontender to palpation Heart: RRR, s1 s2 normal 2/6 aortic stenosis murmur with 1/6 aortic insufficiency; no S3 gallop, no rubs, thrills or heaves Abdomen: soft, nontender; no hepatosplenomehaly, BS+; abdominal aorta nontender and not dilated by palpation. Pulses 2+ Extremities: Trivial ankle edemano clubbing cyanosis, Homan's sign negative  Neurologic: grossly nonfocal Psychologic: normal affect and mood.  ECG (independently read by me): Normal sinus rhythm at 71 beats per minute. RBBB with repolarization changes.  QTc interval 465 ms.  Prior December 2014 ECG: Sinus rhythm at 77 beats  per minute with right bundle branch block and associated repolarization changes. Borderline first-degree AV block with a PR interval of 206 ms. QTC of 454 ms.  LABS:  BMET    Component Value Date/Time   NA 139 07/20/2009 1616   K 4.3 07/20/2009 1616   CL 104 07/20/2009 1616   GLUCOSE 150* 07/20/2009 1616   BUN 20 07/20/2009 1616   CREATININE 1.1 07/20/2009 1616     Hepatic Function Panel  No results found for this basename: prot,  albumin,  ast,  alt,  alkphos,  bilitot,  bilidir,  ibili     CBC    Component Value Date/Time   HGB 10.4* 07/21/2009 1313   HCT 35.0* 07/20/2009 1616     BNP No results found for this basename: probnp    Lipid Panel  No results found for this basename: chol,  trig,  hdl,  cholhdl,  vldl,  ldlcalc     RADIOLOGY: No results found.    ASSESSMENT AND PLAN:  Mr. Liendo is now 81 years old and is 27 years following his CABG revascularization surgery. He has been documented to have an atretic LIMA graft but otherwise patent vein grafts. He also has developed moderate to moderately severe aortic valve stenosis. His blood pressure is well controlled. His diabetes is being managed by Dr. Eden Emms. His lipids are excellent on his current therapy.  He denies any change in symptomatology from his last visit.  There is no chest pain.  There is no PND or orthopnea.  He denies any presyncope or syncope.  At times they're just very minimal shortness of breath with significant activity.  He never did have his 1 year followup echo Doppler study to reassess his aortic valve stenosis.  I will schedule this to be done in October 2015 and plan to see him back in the office in November 2015 for followup evaluation.    Troy Sine, MD, [facility]  05/27/2014 8:27 AM

## 2014-05-27 NOTE — Patient Instructions (Signed)
Your physician recommends that you schedule a follow-up appointment in: November 2015  Your physician has requested that you have an echocardiogram. Echocardiography is a painless test that uses sound waves to create images of your heart. It provides your doctor with information about the size and shape of your heart and how well your heart's chambers and valves are working. This procedure takes approximately one hour. There are no restrictions for this procedure. Echo in October 2015

## 2014-06-07 ENCOUNTER — Other Ambulatory Visit (HOSPITAL_COMMUNITY): Payer: Self-pay | Admitting: Diagnostic Radiology

## 2014-06-07 DIAGNOSIS — C61 Malignant neoplasm of prostate: Secondary | ICD-10-CM

## 2014-06-22 ENCOUNTER — Ambulatory Visit (HOSPITAL_COMMUNITY)
Admission: RE | Admit: 2014-06-22 | Discharge: 2014-06-22 | Disposition: A | Discharge status: Home / Self Care | Payer: Medicare Other | Source: Ambulatory Visit | Attending: Urology | Admitting: Urology

## 2014-06-22 ENCOUNTER — Encounter (HOSPITAL_COMMUNITY)
Admission: RE | Admit: 2014-06-22 | Discharge: 2014-06-22 | Disposition: A | Discharge status: Home / Self Care | Payer: Medicare Other | Source: Ambulatory Visit | Attending: Diagnostic Radiology | Admitting: Diagnostic Radiology

## 2014-06-22 DIAGNOSIS — C61 Malignant neoplasm of prostate: Secondary | ICD-10-CM

## 2014-06-22 MED ORDER — TECHNETIUM TC 99M MEDRONATE IV KIT
26.2000 | PACK | Freq: Once | INTRAVENOUS | Status: AC | PRN
  Administered 2014-06-22: 26.2 via INTRAVENOUS

## 2014-07-27 ENCOUNTER — Ambulatory Visit (HOSPITAL_COMMUNITY)
Admission: RE | Admit: 2014-07-27 | Discharge: 2014-07-27 | Disposition: A | Discharge status: Home / Self Care | Payer: Medicare Other | Source: Ambulatory Visit | Attending: Urology | Admitting: Urology

## 2014-07-27 ENCOUNTER — Other Ambulatory Visit (HOSPITAL_COMMUNITY): Payer: Self-pay | Admitting: Urology

## 2014-07-27 DIAGNOSIS — Z8546 Personal history of malignant neoplasm of prostate: Secondary | ICD-10-CM | POA: Diagnosis not present

## 2014-07-27 DIAGNOSIS — M545 Low back pain, unspecified: Secondary | ICD-10-CM | POA: Diagnosis present

## 2014-07-27 DIAGNOSIS — M47817 Spondylosis without myelopathy or radiculopathy, lumbosacral region: Secondary | ICD-10-CM | POA: Diagnosis not present

## 2014-07-27 DIAGNOSIS — M51379 Other intervertebral disc degeneration, lumbosacral region without mention of lumbar back pain or lower extremity pain: Secondary | ICD-10-CM | POA: Insufficient documentation

## 2014-07-27 DIAGNOSIS — M259 Joint disorder, unspecified: Secondary | ICD-10-CM | POA: Insufficient documentation

## 2014-07-27 DIAGNOSIS — M5137 Other intervertebral disc degeneration, lumbosacral region: Secondary | ICD-10-CM | POA: Diagnosis not present

## 2014-07-27 DIAGNOSIS — I7090 Unspecified atherosclerosis: Secondary | ICD-10-CM | POA: Diagnosis not present

## 2014-07-27 DIAGNOSIS — C61 Malignant neoplasm of prostate: Secondary | ICD-10-CM

## 2014-09-12 ENCOUNTER — Ambulatory Visit
Admission: RE | Admit: 2014-09-12 | Discharge: 2014-09-12 | Disposition: A | Discharge status: Home / Self Care | Payer: Medicare Other | Source: Ambulatory Visit | Attending: Allergy and Immunology | Admitting: Allergy and Immunology

## 2014-09-12 ENCOUNTER — Other Ambulatory Visit: Payer: Self-pay | Admitting: Allergy and Immunology

## 2014-09-12 DIAGNOSIS — R0602 Shortness of breath: Secondary | ICD-10-CM

## 2014-09-26 ENCOUNTER — Encounter (HOSPITAL_COMMUNITY): Payer: Self-pay | Admitting: *Deleted

## 2014-09-29 ENCOUNTER — Telehealth: Payer: Self-pay | Admitting: Cardiovascular Disease

## 2014-09-29 NOTE — Telephone Encounter (Signed)
Received 8 pages of records from Dr [facility] for patient appointment on 10/25/14 with Dr Claiborne Billings.  Records given to [facility] for Dr Evette Georges schedule of 11/3/15mlp

## 2014-10-18 ENCOUNTER — Ambulatory Visit (HOSPITAL_COMMUNITY)
Admission: RE | Admit: 2014-10-18 | Discharge: 2014-10-18 | Disposition: A | Discharge status: Home / Self Care | Payer: Medicare Other | Source: Ambulatory Visit | Attending: Cardiology | Admitting: Cardiology

## 2014-10-18 DIAGNOSIS — E785 Hyperlipidemia, unspecified: Secondary | ICD-10-CM | POA: Diagnosis not present

## 2014-10-18 DIAGNOSIS — I25810 Atherosclerosis of coronary artery bypass graft(s) without angina pectoris: Secondary | ICD-10-CM

## 2014-10-18 DIAGNOSIS — I10 Essential (primary) hypertension: Secondary | ICD-10-CM | POA: Diagnosis not present

## 2014-10-18 DIAGNOSIS — E119 Type 2 diabetes mellitus without complications: Secondary | ICD-10-CM | POA: Insufficient documentation

## 2014-10-18 DIAGNOSIS — I350 Nonrheumatic aortic (valve) stenosis: Secondary | ICD-10-CM

## 2014-10-18 DIAGNOSIS — I359 Nonrheumatic aortic valve disorder, unspecified: Secondary | ICD-10-CM | POA: Diagnosis not present

## 2014-10-18 NOTE — Progress Notes (Signed)
2D Echocardiogram Complete.  10/18/2014   Douglas Carrillo, [place]

## 2014-10-19 ENCOUNTER — Ambulatory Visit (INDEPENDENT_AMBULATORY_CARE_PROVIDER_SITE_OTHER): Payer: Medicare Other | Admitting: Internal Medicine

## 2014-10-19 ENCOUNTER — Encounter: Payer: Self-pay | Admitting: Internal Medicine

## 2014-10-19 VITALS — BP 110/58 | HR 95 | Ht 72.0 in | Wt 218.0 lb

## 2014-10-19 DIAGNOSIS — R0689 Other abnormalities of breathing: Secondary | ICD-10-CM

## 2014-10-19 DIAGNOSIS — I2510 Atherosclerotic heart disease of native coronary artery without angina pectoris: Secondary | ICD-10-CM

## 2014-10-19 DIAGNOSIS — R0600 Dyspnea, unspecified: Secondary | ICD-10-CM

## 2014-10-19 NOTE — Patient Instructions (Signed)
#  Shortness of breath  - your echo might be unchanged compared to 2014 but please check with Dr Claiborne Billings about results  - your heart could be contributing to your shortness of breath but please check with Dr Claiborne Billings  - to determine if you have a lung problem causing shortness of breath   - do full PFT   - do walk test on room air   - do ONO test on room air  - do High Resolution CT chest without contrast on ILD protocol.   Folllwup  - < 3-4 weeks to see Douglas Carrillo after completion of above

## 2014-10-19 NOTE — Progress Notes (Signed)
Subjective:    Patient ID: Douglas Carrillo, male    DOB: [birth year], 80 y.o.   MRN: [id number] PCP Limmie Patricia, MD Allergist is Dr. Fredderick Phenix Cardiologist Dr. Shelva Majestic Pulmonary is Dr. Fuller Plan  HPI  IOV 10/19/2014  Chief Complaint  Patient presents with  . Pulmonary Consult    Pt referred by Dr. Fredderick Phenix for COPD.    80 year old male accompanied by his wife. Referred by Dr. Fredderick Phenix for dyspnea evaluation and possible COPD. History gained from his wife, patient and from review of allergy notes  -He has chronic allergies and has been following with the allergy practice for several years. He is reporting new onset of dyspnea for the last few months at the most. Insidious onset. It is present on exertion and relieved by rest. Class II in severity. Started on Symbicort 2 weeks ago based on chest x-ray showing COPD and this has helped him partially. There is no associated cough, chest tightness, wheezing [he did admit to the allergist that he was having occasional wheezing with exertion]. No nocturnal symptoms but he does wake up in the night due to hot flashes from his Lupron injections. He rates his dyspnea is moderate in severity.  Walking desaturation test 180 feet 3 laps on room air in our pulmonary office: desats to 87%   Other recent evaluation  XL nitric oxide at the allergy practice was 15 and normal on 10/10/2014  - Spirometry 10/10/2014 shows severe obstruction with FEV1 48%, vital capacity of 57% and that did not improve with albuterol though he did feel better. I suspect it was at this time that he was started on Symbicort  - Echocardiogram yesterday 10/18/2014 shows moderate aortic stenosis with ejection fraction 55%. It appears these findings are similar to 2014. He reports having had a normal cardiac stress test in the last few years by Dr. Claiborne Billings   has a past medical history of Hypertension; Diabetes mellitus; High cholesterol; and Prostate  cancer.   reports that he quit smoking about 30 years ago. His smoking use included Cigarettes. He has a 30 pack-year smoking history. He has never used smokeless tobacco.   has past surgical history that includes Coronary artery bypass graft and Colon surgery.  History  Substance Use Topics  . Smoking status: Former Smoker -- 1.00 packs/day for 30 years    Types: Cigarettes    Quit date: 12/24/1983  . Smokeless tobacco: Never Used     Comment: quit smoking in 1985  . Alcohol Use: Yes     Comment: rarely    Immunization History  Administered Date(s) Administered  . Influenza Split 10/05/2014    Current outpatient prescriptions:amLODipine (NORVASC) 5 MG tablet, Take 1 tablet (5 mg total) by mouth daily., Disp: 90 tablet, Rfl: 3;  aspirin EC 325 MG tablet, Take 325 mg by mouth daily., Disp: , Rfl: ;  b complex vitamins capsule, Take 1 capsule by mouth daily., Disp: , Rfl: ;  clopidogrel (PLAVIX) 75 MG tablet, TAKE 1 TABLET DAILY, Disp: 90 tablet, Rfl: 3;  CRESTOR 20 MG tablet, Take 1 tablet by mouth daily., Disp: , Rfl:  EPIPEN 2-PAK 0.3 MG/0.3ML SOAJ injection, , Disp: , Rfl: ;  furosemide (LASIX) 40 MG tablet, 1 tablet daily, Disp: 90 tablet, Rfl: 3;  GLIPIZIDE XL 10 MG 24 hr tablet, Take 1 tablet by mouth daily., Disp: , Rfl: ;  isosorbide mononitrate (IMDUR) 60 MG 24 hr tablet, Take 1&1/2 tablets by mouth daily., Disp: 135 tablet, Rfl: 2;  ketotifen (THERA TEARS ALLERGY) 0.025 % ophthalmic solution, 1 drop 2 (two) times daily., Disp: , Rfl:  KLOR-CON M20 20 MEQ tablet, TAKE 3 TABLETS DAILY, Disp: 270 tablet, Rfl: 3;  latanoprost (XALATAN) 0.005 % ophthalmic solution, Place 1 drop into both eyes at bedtime., Disp: , Rfl: ;  losartan-hydrochlorothiazide (HYZAAR) 100-25 MG per tablet, TAKE 1 TABLET DAILY, Disp: 90 tablet, Rfl: 3;  meclizine (ANTIVERT) 25 MG tablet, Take 25 mg by mouth as needed for dizziness., Disp: , Rfl:  metFORMIN (GLUCOPHAGE-XR) 500 MG 24 hr tablet, Take 1 tablet by mouth  2 (two) times daily., Disp: , Rfl: ;  metoprolol succinate (TOPROL-XL) 100 MG 24 hr tablet, Take 1 tablet (100mg ) in the morning. Take 1/2 tablet (50mg ) at bedtime., Disp: 135 tablet, Rfl: 3;  Multiple Vitamins-Minerals (CENTRUM SILVER ULTRA MENS PO), Take 1 tablet by mouth daily., Disp: , Rfl: ;  NEXIUM 40 MG capsule, Take 1 capsule by mouth daily., Disp: , Rfl:  oxybutynin (DITROPAN-XL) 5 MG 24 hr tablet, Take 1 tablet by mouth daily., Disp: , Rfl: ;  pioglitazone (ACTOS) 45 MG tablet, Take 1 tablet by mouth daily., Disp: , Rfl: ;  PROAIR HFA 108 (90 BASE) MCG/ACT inhaler, Inhale into the lungs as needed., Disp: , Rfl: ;  RESTASIS 0.05 % ophthalmic emulsion, Place 1 drop into both eyes 2 (two) times daily., Disp: , Rfl:  SYMBICORT 80-4.5 MCG/ACT inhaler, Inhale 2 puffs into the lungs 2 (two) times daily., Disp: , Rfl: ;  VICTOZA 18 MG/3ML SOPN, Inject 1.8 mg as directed daily., Disp: , Rfl: ;  vitamin C (ASCORBIC ACID) 500 MG tablet, Take 500 mg by mouth 2 (two) times daily., Disp: , Rfl: ;  Vitamin D, Ergocalciferol, (DRISDOL) 50000 UNITS CAPS capsule, Take 1 capsule by mouth. Every other week, Disp: , Rfl:  vitamin E 400 UNIT capsule, Take 400 Units by mouth daily., Disp: , Rfl: ;  ZETIA 10 MG tablet, Take 1 tablet by mouth daily., Disp: , Rfl:   Review of Systems  Constitutional: Negative for fever and unexpected weight change.  HENT: Positive for sinus pressure. Negative for congestion, dental problem, ear pain, nosebleeds, postnasal drip, rhinorrhea, sneezing, sore throat and trouble swallowing.   Eyes: Negative for redness and itching.  Respiratory: Positive for cough and shortness of breath. Negative for chest tightness and wheezing.   Cardiovascular: Positive for leg swelling. Negative for palpitations.  Gastrointestinal: Negative for nausea and vomiting.  Genitourinary: Negative for dysuria.  Musculoskeletal: Negative for joint swelling.  Skin: Negative for rash.  Neurological: Negative for  headaches.  Hematological: Does not bruise/bleed easily.  Psychiatric/Behavioral: Negative for dysphoric mood. The patient is not nervous/anxious.        Objective:   Physical Exam  Nursing note and vitals reviewed. Constitutional: He is oriented to person, place, and time. He appears well-developed and well-nourished. No distress.  Body mass index is 29.56 kg/(m^2).   HENT:  Head: Normocephalic and atraumatic.  Right Ear: External ear normal.  Left Ear: External ear normal.  Mouth/Throat: Oropharynx is clear and moist. No oropharyngeal exudate.  Eyes: Conjunctivae and EOM are normal. Pupils are equal, round, and reactive to light. Right eye exhibits no discharge. Left eye exhibits no discharge. No scleral icterus.  Neck: Normal range of motion. Neck supple. No JVD present. No tracheal deviation present. No thyromegaly present.  Cardiovascular: Normal rate, regular rhythm and intact distal pulses.  Exam reveals no gallop and no friction rub.   Murmur heard. Pulmonary/Chest: Effort  normal and breath sounds normal. No respiratory distress. He has no wheezes. He has no rales. He exhibits no tenderness.  Scar of cabg +  Abdominal: Soft. Bowel sounds are normal. He exhibits no distension and no mass. There is no tenderness. There is no rebound and no guarding.  Musculoskeletal: Normal range of motion. He exhibits no edema and no tenderness.  Lymphadenopathy:    He has no cervical adenopathy.  Neurological: He is alert and oriented to person, place, and time. He has normal reflexes. No cranial nerve deficit. Coordination normal.  Skin: Skin is warm and dry. No rash noted. He is not diaphoretic. No erythema. No pallor.  Psychiatric: He has a normal mood and affect. His behavior is normal. Judgment and thought content normal.   Filed Vitals:   10/19/14 1009  BP: 110/58  Pulse: 95  Height: 6' (1.829 m)  Weight: 218 lb (98.884 kg)  SpO2: 96%        Assessment & Plan:  #Shortness of  breath  - your echo might be unchanged compared to 2014 but please check with Dr Claiborne Billings about results  - your heart (diastolic dysfunction and moderate aortic stenosis] could be contributing to your shortness of breath but please check with Dr Claiborne Billings  - to determine extent and nature of lung problem  - do full PFT   - do ONO test on room air  - do High Resolution CT chest without contrast on ILD protocol.   Folllwup  - < 3-4 weeks to see Tammy Parrett after completion of above   Dr. Brand Males, M.D., [facility].C.P Pulmonary and Critical Care Medicine Staff Physician [facility] Pager: [phone], If no answer or between  15:00h - 7:00h: call [phone]  10/19/2014 11:13 AM

## 2014-10-20 ENCOUNTER — Telehealth: Payer: Self-pay | Admitting: Internal Medicine

## 2014-10-20 NOTE — Telephone Encounter (Signed)
Received records, 10 pages, from [facility], sent to [facility] on 10/20/14/ss

## 2014-10-21 ENCOUNTER — Ambulatory Visit (INDEPENDENT_AMBULATORY_CARE_PROVIDER_SITE_OTHER): Payer: Medicare Other | Admitting: Internal Medicine

## 2014-10-21 DIAGNOSIS — R0689 Other abnormalities of breathing: Secondary | ICD-10-CM

## 2014-10-21 DIAGNOSIS — R0600 Dyspnea, unspecified: Secondary | ICD-10-CM

## 2014-10-21 LAB — PULMONARY FUNCTION TEST
DL/VA % pred: 55 %
DL/VA: 2.57 ml/min/mmHg/L
DLCO unc % pred: 41 %
DLCO unc: 13.82 ml/min/mmHg
FEF 25-75 Post: 1.48 L/sec
FEF 25-75 Pre: 1.08 L/sec
FEF2575-%CHANGE-POST: 37 %
FEF2575-%PRED-PRE: 54 %
FEF2575-%Pred-Post: 74 %
FEV1-%Change-Post: 6 %
FEV1-%PRED-PRE: 75 %
FEV1-%Pred-Post: 79 %
FEV1-POST: 2.15 L
FEV1-Pre: 2.03 L
FEV1FVC-%CHANGE-POST: 1 %
FEV1FVC-%Pred-Pre: 92 %
FEV6-%CHANGE-POST: 4 %
FEV6-%Pred-Post: 86 %
FEV6-%Pred-Pre: 82 %
FEV6-Post: 3.04 L
FEV6-Pre: 2.91 L
FEV6FVC-%Change-Post: 0 %
FEV6FVC-%Pred-Post: 104 %
FEV6FVC-%Pred-Pre: 104 %
FVC-%CHANGE-POST: 4 %
FVC-%PRED-POST: 83 %
FVC-%Pred-Pre: 79 %
FVC-Post: 3.1 L
FVC-Pre: 2.96 L
POST FEV1/FVC RATIO: 69 %
PRE FEV1/FVC RATIO: 68 %
Post FEV6/FVC ratio: 98 %
Pre FEV6/FVC Ratio: 98 %
RV % pred: 85 %
RV: 2.4 L
TLC % pred: 78 %
TLC: 5.71 L

## 2014-10-21 NOTE — Progress Notes (Signed)
PFT done today. 

## 2014-10-24 ENCOUNTER — Ambulatory Visit (INDEPENDENT_AMBULATORY_CARE_PROVIDER_SITE_OTHER)
Admission: RE | Admit: 2014-10-24 | Discharge: 2014-10-24 | Disposition: A | Discharge status: Home / Self Care | Payer: Medicare Other | Source: Ambulatory Visit | Attending: Internal Medicine | Admitting: Internal Medicine

## 2014-10-24 DIAGNOSIS — R0689 Other abnormalities of breathing: Secondary | ICD-10-CM

## 2014-10-24 DIAGNOSIS — R0600 Dyspnea, unspecified: Secondary | ICD-10-CM

## 2014-10-25 ENCOUNTER — Telehealth: Payer: Self-pay | Admitting: Internal Medicine

## 2014-10-25 ENCOUNTER — Encounter: Payer: Self-pay | Admitting: Cardiovascular Disease

## 2014-10-25 ENCOUNTER — Ambulatory Visit (INDEPENDENT_AMBULATORY_CARE_PROVIDER_SITE_OTHER): Payer: Medicare Other | Admitting: Cardiovascular Disease

## 2014-10-25 VITALS — BP 150/66 | HR 77 | Ht 72.0 in | Wt 214.0 lb

## 2014-10-25 DIAGNOSIS — I2583 Coronary atherosclerosis due to lipid rich plaque: Secondary | ICD-10-CM

## 2014-10-25 DIAGNOSIS — I2510 Atherosclerotic heart disease of native coronary artery without angina pectoris: Secondary | ICD-10-CM

## 2014-10-25 DIAGNOSIS — R0600 Dyspnea, unspecified: Secondary | ICD-10-CM

## 2014-10-25 DIAGNOSIS — I25768 Atherosclerosis of bypass graft of coronary artery of transplanted heart with other forms of angina pectoris: Secondary | ICD-10-CM

## 2014-10-25 DIAGNOSIS — I350 Nonrheumatic aortic (valve) stenosis: Secondary | ICD-10-CM

## 2014-10-25 DIAGNOSIS — R0689 Other abnormalities of breathing: Secondary | ICD-10-CM

## 2014-10-25 DIAGNOSIS — I10 Essential (primary) hypertension: Secondary | ICD-10-CM

## 2014-10-25 DIAGNOSIS — E785 Hyperlipidemia, unspecified: Secondary | ICD-10-CM

## 2014-10-25 DIAGNOSIS — I4510 Unspecified right bundle-branch block: Secondary | ICD-10-CM

## 2014-10-25 NOTE — Telephone Encounter (Signed)
Pt returning call.Douglas Carrillo ° °

## 2014-10-25 NOTE — Progress Notes (Signed)
Patient ID: Douglas Carrillo, male   DOB: [birth year], 80 y.o.   MRN: [id number]      HPI: Douglas Carrillo is a 80 y.o. male who presents to the office today for a 6 month cardiology evaluation.  Douglas Carrillo has established coronary artery disease in April 2000 underwent CABG surgery by Dr. Cyndia Bent  (LIMA to the LAD, vein to the diagonal,  vein to the intermediate, and vein to the PDA). In January 2007 cardiac catheterization was done due to exertional dyspnea and mild abnormality on nuclear imaging. This revealed patent vein grafts to the diagonal, marginal, and PDA, but he had an atretic LIMA graft to the LAD with evidence for 60% narrowing in the proximal left subclavian. He also developed moderate aortic stenosis. His last nuclear perfusion study in May 2013 showed normal perfusion. An echo Doppler study in April 2013 showed mild LVH with normal systolic function. Moderate calcification of the trileaflet aortic valve with a mean gradient of 23 and maximum gradient of 45 with an estimated aortic valve area of 1.1 cm consistent with moderate valvular aortic stenosis. He did have mild to moderate aortic insufficiency. A one-year followup echo Doppler study in May 2014 again showed normal systolic function. There was grade 1 diastolic dysfunction. He was felt to have moderately to severe aortic stenosis with a mean gradient of 22 peak gradient of 41 and based on an LVOT diameter of 2.0 cm the calculated aortic valve area was approximately 1.0 cm. There is mild AR. He did have heavy calcification of the mitral annulus with mild MR with very mild stenosis with a mean gradient of 4 mm. Right ventricle cavity was mildly dilated.   As I last saw him, they're [facility] underwent a follow-up echo Doppler study on 10/18/2014.  This continues to show normal LV function with an ejection fraction of 55-60%.  There was grade 1 diastolic dysfunction.  His mean transvalvular aortic gradient was now 27 mm and a peak gradient 54  mm.  Aortic valve area was 1.21 cm concordant with moderate aortic valve stenosis.  There was mild aortic insufficiency.  There was severe mitral annular calcification.  He had mild to moderate left atrial dilatation and mild right atrial dilatation.  Compared to his May 2014 study his mean transvalvular aorticgradient had increased from 22-27 mm.  Douglas Carrillo sees Dr. Eden Emms for his primary care. Additional problems include diabetes mellitus for which he is on metformin, Victoza, Actos, as well as glipizide. He has hyperlipidemia on Crestor and Zetia. Has history of hypertension currently treated with amlodipine, furosemide 40 mg, losartan HCTZ, metoprolol succinate 100 mg, in addition to his isosorbide. He has been documented to have chronic right bundle branch block. He sees Dr. Chase Caller for lung disease and recently underwent pulmonary function studies for further evaluation of shortness of breath.  He has a history of prostate CA.  He tells me he recently had  Lupron therapy with improvement in his PSA, which had risen.  Over the past6 months, Douglas Carrillo  specifically denies chest pain. He denies presyncope or syncope. He denies CHF symptoms.  There is some  mild shortness of breath with activity, but he believes this has not changed at all for the past year.  He feels he needs to perform deep chest breathing with activity.He is unaware of any palpitations.  l.  Past Medical History  Diagnosis Date  . Hypertension   . Diabetes mellitus   . High cholesterol   . Prostate cancer  Past Surgical History  Procedure Laterality Date  . Coronary artery bypass graft      x 4  . Colon surgery      x 4    Allergies  Allergen Reactions  . Mold Extract [Trichophyton] Anaphylaxis    Current Outpatient Prescriptions  Medication Sig Dispense Refill  . amLODipine (NORVASC) 5 MG tablet Take 1 tablet (5 mg total) by mouth daily. 90 tablet 3  . aspirin EC 325 MG tablet Take 325 mg by mouth  daily.    Marland Kitchen b complex vitamins capsule Take 1 capsule by mouth daily.    . B-D ULTRAFINE III SHORT PEN 31G X 8 MM MISC     . clopidogrel (PLAVIX) 75 MG tablet TAKE 1 TABLET DAILY 90 tablet 3  . CRESTOR 20 MG tablet Take 1 tablet by mouth daily.    Marland Kitchen EPIPEN 2-PAK 0.3 MG/0.3ML SOAJ injection     . furosemide (LASIX) 40 MG tablet 1 tablet daily 90 tablet 3  . GLIPIZIDE XL 10 MG 24 hr tablet Take 1 tablet by mouth daily.    . isosorbide mononitrate (IMDUR) 60 MG 24 hr tablet Take 1&1/2 tablets by mouth daily. 135 tablet 2  . ketotifen (THERA TEARS ALLERGY) 0.025 % ophthalmic solution 1 drop 2 (two) times daily.    Marland Kitchen KLOR-CON M20 20 MEQ tablet TAKE 3 TABLETS DAILY 270 tablet 3  . latanoprost (XALATAN) 0.005 % ophthalmic solution Place 1 drop into both eyes at bedtime.    Marland Kitchen losartan-hydrochlorothiazide (HYZAAR) 100-25 MG per tablet TAKE 1 TABLET DAILY 90 tablet 3  . meclizine (ANTIVERT) 25 MG tablet Take 25 mg by mouth as needed for dizziness.    . metFORMIN (GLUCOPHAGE-XR) 500 MG 24 hr tablet Take 1 tablet by mouth 2 (two) times daily.    . metoprolol succinate (TOPROL-XL) 100 MG 24 hr tablet Take 1 tablet (100mg ) in the morning. Take 1/2 tablet (50mg ) at bedtime. 135 tablet 3  . Multiple Vitamins-Minerals (CENTRUM SILVER ULTRA MENS PO) Take 1 tablet by mouth daily.    Marland Kitchen NEXIUM 40 MG capsule Take 1 capsule by mouth daily.    Marland Kitchen oxybutynin (DITROPAN-XL) 5 MG 24 hr tablet Take 1 tablet by mouth daily.    . pioglitazone (ACTOS) 45 MG tablet Take 1 tablet by mouth daily.    Marland Kitchen PROAIR HFA 108 (90 BASE) MCG/ACT inhaler Inhale into the lungs as needed.    . RESTASIS 0.05 % ophthalmic emulsion Place 1 drop into both eyes 2 (two) times daily.    Marland Kitchen Spacer/Aero-Holding Chambers (AEROCHAMBER PLUS FLO-VU) MISC   1  . SYMBICORT 80-4.5 MCG/ACT inhaler Inhale 2 puffs into the lungs 2 (two) times daily.    Marland Kitchen VICTOZA 18 MG/3ML SOPN Inject 1.8 mg as directed daily.    . vitamin C (ASCORBIC ACID) 500 MG tablet Take  500 mg by mouth 2 (two) times daily.    . Vitamin D, Ergocalciferol, (DRISDOL) 50000 UNITS CAPS capsule Take 1 capsule by mouth. Every other week    . vitamin E 400 UNIT capsule Take 400 Units by mouth daily.    Marland Kitchen ZETIA 10 MG tablet Take 1 tablet by mouth daily.     No current facility-administered medications for this visit.    History   Social History  . Marital Status: Widowed    Spouse Name: N/A    Number of Children: N/A  . Years of Education: N/A   Occupational History  . retired    Science writer  History Main Topics  . Smoking status: Former Smoker -- 1.00 packs/day for 30 years    Types: Cigarettes    Quit date: 12/24/1983  . Smokeless tobacco: Never Used     Comment: quit smoking in 1985  . Alcohol Use: Yes     Comment: rarely  . Drug Use: No  . Sexual Activity: Not on file   Other Topics Concern  . Not on file   Social History Narrative   Social history is notable that he is widowed. His daughter lives block away. He has one child and 2 grandchildren. He goes to the gym 4 days a week to exercise. Family History  Problem Relation Age of Onset  . Kidney disease Brother     ROS General: Negative; No fevers, chills, or night sweats;  HEENT: Negative; No changes in vision or hearing, sinus congestion, difficulty swallowing Pulmonary: positive for mild; No cough, wheezing, , hemoptysis Cardiovascular: See history of present illness No chest pain, presyncope, syncope, palpatations At times, very mild lower extremity edema GI: Negative; No nausea, vomiting, diarrhea, or abdominal pain GU: positive for history of prostate CA; No dysuria, hematuria, or difficulty voiding Musculoskeletal: Negative; no myalgias, joint pain, or weakness Hematologic/Oncology: Negative; no easy bruising, bleeding Endocrine: Positive for type; no heat/cold intolerance Neuro: Negative; no changes in balance, headaches Skin: Negative; No rashes or skin lesions Psychiatric: Negative; No  behavioral problems, depression Sleep: Negative; No snoring, daytime sleepiness, hypersomnolence, bruxism, restless legs, hypnogognic hallucinations, no cataplexy Other comprehensive 14 point system review is negative  PE BP 150/66 mmHg  Pulse 77  Ht 6' (1.829 m)  Wt 214 lb (97.07 kg)  BMI 29.02 kg/m2  General: Alert, oriented, no distress.  Skin: normal turgor, no rashes HEENT: Normocephalic, atraumatic. Pupils round and reactive; sclera anicteric;no lid lag.  Nose without nasal septal hypertrophy Mouth/Parynx benign; Mallinpatti scale 2 Neck: No JVD, transmitted murmur to his carotids Lungs: clear to ausculatation and percussion; no wheezing or rales Chest wall: Nontender to palpation Heart: RRR, s1 s2 normal 2/6 aortic stenosis murmur with 1/6 aortic insufficiency; no S3 gallop, no rubs, thrills or heaves Abdomen: soft, nontender; no hepatosplenomehaly, BS+; abdominal aorta nontender and not dilated by palpation. Pulses 2+ Extremities: Trivial ankle edema, no clubbing cyanosis, Homan's sign negative  Neurologic: grossly nonfocal Psychologic: normal affect and mood.  ECG (independently read by me): normal sinus rhythm.  Right bundle branch block.  Isolated  premature ventricular contraction.  June 2015 ECG (independently read by me): Normal sinus rhythm at 71 beats per minute. RBBB with repolarization changes.  QTc interval 465 ms.  Prior December 2014 ECG: Sinus rhythm at 77 beats per minute with right bundle branch block and associated repolarization changes. Borderline first-degree AV block with a PR interval of 206 ms. QTC of 454 ms.  LABS:  BMET    Component Value Date/Time   NA 139 07/20/2009 1616   K 4.3 07/20/2009 1616   CL 104 07/20/2009 1616   GLUCOSE 150* 07/20/2009 1616   BUN 20 07/20/2009 1616   CREATININE 1.1 07/20/2009 1616     Hepatic Function Panel  No results found for: PROT   CBC    Component Value Date/Time   HGB 10.4* 07/21/2009 1313   HCT  35.0* 07/20/2009 1616     BNP No results found for: PROBNP  Lipid Panel  No results found for: CHOL   RADIOLOGY: No results found.    ASSESSMENT AND PLAN:  Mr. Fleck is an 80  years old gentleman who is now 15 years following his CABG revascularization surgery. He has been documented to have an atretic LIMA graft but otherwise patent vein grafts. I have reviewed his echo Doppler data from his recent study, which continues to suggest moderate aortic valve stenosis with an estimated valve area 1.2 cm.  There was slight increase in his mean gradient from 22-27 and in his peak gradient from 41-54 mm meters from his last evaluation. Does note shortness of breath with activity.  He denies recurrent chest pain or anginal type symptoms.  I discussed with him the potential multi-factorial etiology.  2.  Shortness of breath, which may be contributed by his underlying Carney artery disease, diastolic dysfunction, aortic valve stenosis, as well as his pulmonary disease.  We will be following up with Dr. Chase Caller with reference to his pulmonary status.  His blood pressure today is improved.  We'll recheck by me at 132/74 on his current medical regimen.  He does have an occasional PVC.  He already is on Toprol-XL 100 mg in the morning and 50 mg at night, and I will not further increase this presently.  He is continuing to tolerate losartan HCT 100/25 and also takes furosemide 40 mg.  He wears support stockings.  There is trivial residual ankle swelling.he denies PND, orthopnea.  I have recommended that in 6 months.  He have a follow-up echo Doppler study.  If that study does not demonstrate significantly progressive aortic stenosis.  We will then schedule him for a Lexiscan Myoview study, which will be 16 years following his CABG surgery and I will see him back in the office  for follow-up evaluation.  Troy Sine, MD, [facility]  10/25/2014 9:32 AM

## 2014-10-25 NOTE — Telephone Encounter (Signed)
PFT show obstruction with CT chest showing emphysema. Please ensure that he follows with Douglas Carrillo per prior OV plan so she can add spiriva to symbicort

## 2014-10-25 NOTE — Telephone Encounter (Signed)
lmtcb for pt.  

## 2014-10-25 NOTE — Patient Instructions (Signed)
Your physician has requested that you have a lexiscan myoview in May of 2016. For further information please visit [email or web address]. Please follow instruction sheet, as given.  Your physician has requested that you have an echocardiogram In April of 2016. Echocardiography is a painless test that uses sound waves to create images of your heart. It provides your doctor with information about the size and shape of your heart and how well your heart's chambers and valves are working. This procedure takes approximately one hour. There are no restrictions for this procedure.  Your physician wants you to follow-up in: may of 2016 with Dr. Claiborne Billings. You will receive a reminder letter in the mail two months in advance. If you don't receive a letter, please call our office to schedule the follow-up appointment.

## 2014-10-27 NOTE — Telephone Encounter (Signed)
Called and spoke to pt. Informed pt of the results and recs per MR. Pt verbalized understanding and denied any further questions or concerns at this time.  

## 2014-11-02 ENCOUNTER — Telehealth: Payer: Self-pay | Admitting: Internal Medicine

## 2014-11-02 NOTE — Telephone Encounter (Signed)
Pt returning call.Douglas Carrillo ° °

## 2014-11-02 NOTE — Telephone Encounter (Signed)
Douglas Carrillo ONO 10/20/14 shows  Desaturations <88% for 1h 50 min which is 27% of sleep time. Definitely needs to kee p up TP appt later this month

## 2014-11-02 NOTE — Telephone Encounter (Signed)
lmtcb for pt.  

## 2014-11-03 NOTE — Telephone Encounter (Signed)
Called and spoke to pt. Informed pt of the results and recs per MR. Pt verbalized understanding and denied any further questions or concerns at this time.  

## 2014-11-10 ENCOUNTER — Encounter: Payer: Self-pay | Admitting: Adult Health

## 2014-11-10 ENCOUNTER — Ambulatory Visit (INDEPENDENT_AMBULATORY_CARE_PROVIDER_SITE_OTHER): Payer: Medicare Other | Admitting: Adult Health

## 2014-11-10 VITALS — BP 150/68 | HR 68 | Temp 96.9°F | Ht 72.0 in | Wt 216.6 lb

## 2014-11-10 DIAGNOSIS — G4734 Idiopathic sleep related nonobstructive alveolar hypoventilation: Secondary | ICD-10-CM

## 2014-11-10 DIAGNOSIS — J449 Chronic obstructive pulmonary disease, unspecified: Secondary | ICD-10-CM | POA: Insufficient documentation

## 2014-11-10 DIAGNOSIS — J9611 Chronic respiratory failure with hypoxia: Secondary | ICD-10-CM

## 2014-11-10 DIAGNOSIS — I2510 Atherosclerotic heart disease of native coronary artery without angina pectoris: Secondary | ICD-10-CM

## 2014-11-10 MED ORDER — TIOTROPIUM BROMIDE MONOHYDRATE 2.5 MCG/ACT IN AERS: 2.0000 | INHALATION_SPRAY | Freq: Every day | RESPIRATORY_TRACT | Status: DC

## 2014-11-10 NOTE — Addendum Note (Signed)
Addended by: Desmond Dike C on: 11/10/2014 09:48 AM   Modules accepted: Orders

## 2014-11-10 NOTE — Assessment & Plan Note (Signed)
Begin Oxygen 2l/m At bedtime   Begin Spiriva Respimat 2 puffs daily , brush/rinse after use .  Follow up Dr. Chase Caller in 2 months and As needed   Please contact office for sooner follow up if symptoms do not improve or worsen or seek emergency care

## 2014-11-10 NOTE — Patient Instructions (Signed)
Begin Oxygen 2l/m At bedtime   We are setting you up for a sleep study-split night .  Begin Spiriva Respimat 2 puffs daily , brush/rinse after use .  Follow up Dr. Chase Caller in 2 months and As needed   Please contact office for sooner follow up if symptoms do not improve or worsen or seek emergency care

## 2014-11-10 NOTE — Assessment & Plan Note (Signed)
Begin Oxygen 2l/m At bedtime   We are setting you up for a sleep study-split night .  Follow up Dr. Chase Caller in 2 months and As needed   Please contact office for sooner follow up if symptoms do not improve or worsen or seek emergency care

## 2014-11-10 NOTE — Addendum Note (Signed)
Addended by: Parke Poisson E on: 11/10/2014 11:24 AM   Modules accepted: Orders

## 2014-11-10 NOTE — Progress Notes (Signed)
Subjective:    Patient ID: Douglas Carrillo, male    DOB: 03/04/1928, 80 y.o.   MRN: [id number] PCP Limmie Patricia, MD Allergist is Dr. Fredderick Phenix Cardiologist Dr. Shelva Majestic Pulmonary is Dr. Fuller Plan  HPI  IOV 10/19/2014  Chief Complaint  Patient presents with  . Pulmonary Consult    Pt referred by Dr. Fredderick Phenix for COPD.    80 year old male accompanied by his wife. Referred by Dr. Fredderick Phenix for dyspnea evaluation and possible COPD. History gained from his wife, patient and from review of allergy notes  -He has chronic allergies and has been following with the allergy practice for several years. He is reporting new onset of dyspnea for the last few months at the most. Insidious onset. It is present on exertion and relieved by rest. Class II in severity. Started on Symbicort 2 weeks ago based on chest x-ray showing COPD and this has helped him partially. There is no associated cough, chest tightness, wheezing [he did admit to the allergist that he was having occasional wheezing with exertion]. No nocturnal symptoms but he does wake up in the night due to hot flashes from his Lupron injections. He rates his dyspnea is moderate in severity.  Walking desaturation test 180 feet 3 laps on room air in our pulmonary office: desats to 87%   Other recent evaluation  XL nitric oxide at the allergy practice was 15 and normal on 10/10/2014  - Spirometry 10/10/2014 shows severe obstruction with FEV1 48%, vital capacity of 57% and that did not improve with albuterol though he did feel better. I suspect it was at this time that he was started on Symbicort  - Echocardiogram yesterday 10/18/2014 shows moderate aortic stenosis with ejection fraction 55%. It appears these findings are similar to 2014. He reports having had a normal cardiac stress test in the last few years by Dr. Claiborne Billings   has a past medical history of Hypertension; Diabetes mellitus; High cholesterol; and Prostate  cancer.   reports that he quit smoking about 30 years ago. His smoking use included Cigarettes. He has a 30 pack-year smoking history. He has never used smokeless tobacco.   11/10/2014 Follow up and Test Results Patient returns for 1 month follow-up and test results Patient was seen last visit for a pulmonary consult for COPD and shortness of breath. Patient underwent an overnight oximetry test that showed desaturations of less than 88% for 1 hour 50 minutes which was 27% of the sleep time. PFTs showed obstruction with CT showing emphysema with no evidence of ILD. FEV1 was 75%, ratio 68, FVC 79% Diffusing capacity decreased at 41% Last ov started on Symbicort, did not continue d/t sore throat desptie rinsing after use.  Patient denies any chest pain, orthopnea, PND or leg swelling Complains of daytime sleepiness . Family reports snoring.   Review of Systems  Constitutional:   No  weight loss, night sweats,  Fevers, chills, fatigue, or  lassitude.  HEENT:   No headaches,  Difficulty swallowing,  Tooth/dental problems, or  Sore throat,                No sneezing, itching, ear ache, nasal congestion, post nasal drip,   CV:  No chest pain,  Orthopnea, PND, swelling in lower extremities, anasarca, dizziness, palpitations, syncope.   GI  No heartburn, indigestion, abdominal pain, nausea, vomiting, diarrhea, change in bowel habits, loss of appetite, bloody stools.   Resp:   No non-productive cough,  No coughing up of blood.  No change in color of mucus.  No wheezing.  No chest wall deformity  Skin: no rash or lesions.  GU: no dysuria, change in color of urine, no urgency or frequency.  No flank pain, no hematuria   MS:  No joint pain or swelling.  No decreased range of motion.  No back pain.  Psych:  No change in mood or affect. No depression or anxiety.  No memory loss.         Objective:   Physical Exam  GEN: A/Ox3; pleasant , NAD, obese   HEENT:  [place]/AT,  EACs-clear, TMs-wnl,  NOSE-clear, THROAT-clear, no lesions, no postnasal drip or exudate noted.   NECK:  Supple w/ fair ROM; no JVD; normal carotid impulses w/o bruits; no thyromegaly or nodules palpated; no lymphadenopathy.  RESP  Decreased BS in bases no accessory muscle use, no dullness to percussion  CARD:  RRR, no m/r/g  , no peripheral edema, pulses intact, no cyanosis or clubbing.  GI:   Soft & nt; nml bowel sounds; no organomegaly or masses detected.  Musco: Warm bil, no deformities or joint swelling noted.   Neuro: alert, no focal deficits noted.    Skin: Warm, no lesions or rashes       Assessment & Plan:

## 2014-11-13 ENCOUNTER — Other Ambulatory Visit: Payer: Self-pay | Admitting: Cardiovascular Disease

## 2014-11-14 ENCOUNTER — Telehealth: Payer: Self-pay | Admitting: Adult Health

## 2014-11-14 MED ORDER — TIOTROPIUM BROMIDE MONOHYDRATE 2.5 MCG/ACT IN AERS: 2.0000 | INHALATION_SPRAY | Freq: Every day | RESPIRATORY_TRACT | Status: DC

## 2014-11-14 NOTE — Telephone Encounter (Signed)
Rx was sent to pharmacy electronically. 

## 2014-11-14 NOTE — Telephone Encounter (Signed)
Spoke with pt and he is requesting we send rx for Spiriva respimat to [facility].  Pt advised rx was sent.  Nothing further needed.

## 2014-12-15 ENCOUNTER — Other Ambulatory Visit: Payer: Self-pay | Admitting: Cardiovascular Disease

## 2014-12-15 NOTE — Telephone Encounter (Signed)
Rx refill sent to patient pharmacy   

## 2015-01-06 ENCOUNTER — Other Ambulatory Visit: Payer: Self-pay | Admitting: Cardiovascular Disease

## 2015-01-06 NOTE — Telephone Encounter (Signed)
Rx refill sent to patient pharmacy   

## 2015-01-12 ENCOUNTER — Ambulatory Visit (INDEPENDENT_AMBULATORY_CARE_PROVIDER_SITE_OTHER): Payer: Medicare Other | Admitting: Internal Medicine

## 2015-01-12 ENCOUNTER — Encounter: Payer: Self-pay | Admitting: Internal Medicine

## 2015-01-12 VITALS — BP 142/62 | HR 58 | Ht 72.0 in | Wt 208.0 lb

## 2015-01-12 DIAGNOSIS — J449 Chronic obstructive pulmonary disease, unspecified: Secondary | ICD-10-CM

## 2015-01-12 MED ORDER — TIOTROPIUM BROMIDE-OLODATEROL 2.5-2.5 MCG/ACT IN AERS: 2.0000 | INHALATION_SPRAY | Freq: Every day | RESPIRATORY_TRACT | Status: DC

## 2015-01-12 NOTE — Progress Notes (Signed)
Subjective:    Patient ID: Douglas Carrillo, male    DOB: 11-14-1928, 81 y.o.   MRN: [id number]  HPI    PCP Limmie Patricia, MD Allergist is Dr. Fredderick Phenix Cardiologist Dr. Shelva Majestic Pulmonary is Dr. Fuller Plan  HPI  IOV 10/19/2014  Chief Complaint  Patient presents with  . Pulmonary Consult    Pt referred by Dr. Fredderick Phenix for COPD.    81 year old male accompanied by his wife. Referred by Dr. Fredderick Phenix for dyspnea evaluation and possible COPD. History gained from his wife, patient and from review of allergy notes  -He has chronic allergies and has been following with the allergy practice for several years. He is reporting new onset of dyspnea for the last few months at the most. Insidious onset. It is present on exertion and relieved by rest. Class II in severity. Started on Symbicort 2 weeks ago based on chest x-ray showing COPD and this has helped him partially. There is no associated cough, chest tightness, wheezing [he did admit to the allergist that he was having occasional wheezing with exertion]. No nocturnal symptoms but he does wake up in the night due to hot flashes from his Lupron injections. He rates his dyspnea is moderate in severity.  Walking desaturation test 180 feet 3 laps on room air in our pulmonary office: desats to 87%   Other recent evaluation  XL nitric oxide at the allergy practice was 15 and normal on 10/10/2014  - Spirometry 10/10/2014 shows severe obstruction with FEV1 48%, vital capacity of 57% and that did not improve with albuterol though he did feel better. I suspect it was at this time that he was started on Symbicort  - Echocardiogram yesterday 10/18/2014 shows moderate aortic stenosis with ejection fraction 55%. It appears these findings are similar to 2014. He reports having had a normal cardiac stress test in the last few years by Dr. Claiborne Billings   has a past medical history of Hypertension; Diabetes mellitus; High cholesterol; and  Prostate cancer.   reports that he quit smoking about 30 years ago. His smoking use included Cigarettes. He has a 30 pack-year smoking history. He has never used smokeless tobacco.   11/10/2014 Follow up and Test Results Patient returns for 1 month follow-up and test results Patient was seen last visit for a pulmonary consult for COPD and shortness of breath. Patient underwent an overnight oximetry test that showed desaturations of less than 88% for 1 hour 50 minutes which was 27% of the sleep time. PFTs showed obstruction with CT showing emphysema with no evidence of ILD. FEV1 was 75%, ratio 68, FVC 79% Diffusing capacity decreased at 41% Last ov started on Symbicort, did not continue d/t sore throat desptie rinsing after use.     OV 01/12/2015  Chief Complaint  Patient presents with  . Follow-up    Pt stated his breathing has improved since last OV. Pt stated he thinks the sprivia has helped his breathing. Pt c/o DOE-not as bad as it use to be, mild dry cough. Pt denies CP/tightness.    51 -year-old male presents for multifactorial dyspnea in the setting of Gold stage II COPD. Last visit Gold stage II COPD confirmed but he was having intolerance to Symbicort does not want to try inhaled corticosteroid ever again. He was switched his Spiriva. He reports now that his dyspnea significantly improved and his quality of life is improved. However he still has problems doing yard work and wants to further improve his quality of  life. He is open to trying new inhalers and attending pulmonary rehabilitation.  Of note he did have significant desaturations at night with his overnight oxygen study and my nurse practitioner has set him up for a sleep study    Immunization History  Administered Date(s) Administered  . Influenza Split 10/05/2014  . Pneumococcal Conjugate-13 12/19/2014     Review of Systems  Constitutional: Negative for fever and unexpected weight change.  HENT: Negative for  congestion, dental problem, ear pain, nosebleeds, postnasal drip, rhinorrhea, sinus pressure, sneezing, sore throat and trouble swallowing.   Eyes: Negative for redness and itching.  Respiratory: Positive for cough and shortness of breath. Negative for chest tightness and wheezing.   Cardiovascular: Negative for palpitations and leg swelling.  Gastrointestinal: Negative for nausea and vomiting.  Genitourinary: Negative for dysuria.  Musculoskeletal: Negative for joint swelling.  Skin: Negative for rash.  Neurological: Negative for headaches.  Hematological: Does not bruise/bleed easily.  Psychiatric/Behavioral: Negative for dysphoric mood. The patient is not nervous/anxious.     Current outpatient prescriptions:  .  amLODipine (NORVASC) 5 MG tablet, Take 1 tablet (5 mg total) by mouth daily., Disp: 90 tablet, Rfl: 3 .  aspirin 81 MG tablet, Take 81 mg by mouth daily., Disp: , Rfl:  .  b complex vitamins capsule, Take 1 capsule by mouth daily., Disp: , Rfl:  .  B-D ULTRAFINE III SHORT PEN 31G X 8 MM MISC, , Disp: , Rfl:  .  clopidogrel (PLAVIX) 75 MG tablet, TAKE 1 TABLET DAILY, Disp: 90 tablet, Rfl: 3 .  CRESTOR 20 MG tablet, Take 1 tablet by mouth daily., Disp: , Rfl:  .  EPIPEN 2-PAK 0.3 MG/0.3ML SOAJ injection, , Disp: , Rfl:  .  furosemide (LASIX) 40 MG tablet, TAKE 1 TABLET DAILY, Disp: 90 tablet, Rfl: 3 .  GLIPIZIDE XL 10 MG 24 hr tablet, Take 1 tablet by mouth daily., Disp: , Rfl:  .  isosorbide mononitrate (IMDUR) 60 MG 24 hr tablet, Take 1.5 tablets (90 mg total) by mouth daily., Disp: 135 tablet, Rfl: 2 .  ketotifen (THERA TEARS ALLERGY) 0.025 % ophthalmic solution, 1 drop 2 (two) times daily., Disp: , Rfl:  .  KLOR-CON M20 20 MEQ tablet, TAKE 3 TABLETS DAILY, Disp: 270 tablet, Rfl: 2 .  latanoprost (XALATAN) 0.005 % ophthalmic solution, Place 1 drop into both eyes at bedtime., Disp: , Rfl:  .  losartan-hydrochlorothiazide (HYZAAR) 100-25 MG per tablet, TAKE 1 TABLET DAILY, Disp:  90 tablet, Rfl: 3 .  meclizine (ANTIVERT) 25 MG tablet, Take 25 mg by mouth as needed for dizziness., Disp: , Rfl:  .  metFORMIN (GLUCOPHAGE-XR) 500 MG 24 hr tablet, Take 1 tablet by mouth 2 (two) times daily., Disp: , Rfl:  .  metoprolol succinate (TOPROL-XL) 100 MG 24 hr tablet, Take 1 tablet (100mg ) in the morning. Take 1/2 tablet (50mg ) at bedtime., Disp: 135 tablet, Rfl: 3 .  Multiple Vitamins-Minerals (CENTRUM SILVER ULTRA MENS PO), Take 1 tablet by mouth daily., Disp: , Rfl:  .  NEXIUM 40 MG capsule, Take 1 capsule by mouth daily., Disp: , Rfl:  .  oxybutynin (DITROPAN-XL) 5 MG 24 hr tablet, Take 1 tablet by mouth daily., Disp: , Rfl:  .  pioglitazone (ACTOS) 45 MG tablet, Take 1 tablet by mouth daily., Disp: , Rfl:  .  PROAIR HFA 108 (90 BASE) MCG/ACT inhaler, Inhale into the lungs as needed., Disp: , Rfl:  .  RESTASIS 0.05 % ophthalmic emulsion, Place 1 drop into  both eyes 2 (two) times daily., Disp: , Rfl:  .  Spacer/Aero-Holding Chambers (AEROCHAMBER PLUS FLO-VU) MISC, , Disp: , Rfl: 1 .  Tiotropium Bromide Monohydrate (SPIRIVA RESPIMAT) 2.5 MCG/ACT AERS, Inhale 2 puffs into the lungs daily., Disp: 12 g, Rfl: 1 .  VICTOZA 18 MG/3ML SOPN, Inject 1.8 mg as directed daily., Disp: , Rfl:  .  vitamin C (ASCORBIC ACID) 500 MG tablet, Take 500 mg by mouth 2 (two) times daily., Disp: , Rfl:  .  Vitamin D, Ergocalciferol, (DRISDOL) 50000 UNITS CAPS capsule, Take 1 capsule by mouth. Every other week, Disp: , Rfl:  .  vitamin E 400 UNIT capsule, Take 400 Units by mouth daily., Disp: , Rfl:  .  ZETIA 10 MG tablet, Take 1 tablet by mouth daily., Disp: , Rfl:  .  Tiotropium Bromide Monohydrate (SPIRIVA RESPIMAT) 2.5 MCG/ACT AERS, Inhale 2 puffs into the lungs daily., Disp: 1 Inhaler, Rfl: 0     Objective:   Physical Exam  Constitutional: He is oriented to person, place, and time. He appears well-developed and well-nourished. No distress.  HENT:  Head: Normocephalic and atraumatic.  Right Ear:  External ear normal.  Left Ear: External ear normal.  Mouth/Throat: Oropharynx is clear and moist. No oropharyngeal exudate.  Eyes: Conjunctivae and EOM are normal. Pupils are equal, round, and reactive to light. Right eye exhibits no discharge. Left eye exhibits no discharge. No scleral icterus.  Neck: Normal range of motion. Neck supple. No JVD present. No tracheal deviation present. No thyromegaly present.  Cardiovascular: Normal rate, regular rhythm and intact distal pulses.  Exam reveals no gallop and no friction rub.   Murmur heard. Pulmonary/Chest: Effort normal and breath sounds normal. No respiratory distress. He has no wheezes. He has no rales. He exhibits no tenderness.  Abdominal: Soft. Bowel sounds are normal. He exhibits no distension and no mass. There is no tenderness. There is no rebound and no guarding.  Musculoskeletal: Normal range of motion. He exhibits no edema or tenderness.  Lymphadenopathy:    He has no cervical adenopathy.  Neurological: He is alert and oriented to person, place, and time. He has normal reflexes. No cranial nerve deficit. Coordination normal.  Skin: Skin is warm and dry. No rash noted. He is not diaphoretic. No erythema. No pallor.  Psychiatric: He has a normal mood and affect. His behavior is normal. Judgment and thought content normal.  Nursing note and vitals reviewed.   Filed Vitals:   01/12/15 0953  BP: 142/62  Pulse: 58  Height: 6' (1.829 m)  Weight: 208 lb (94.348 kg)  SpO2: 95%        Assessment & Plan:      ICD-9-CM ICD-10-CM   1. COPD, moderate 496 J44.9         STable disease  Recommends - Continue  Oxygen 2l/m At bedtime  - Will list symbicort as allergy - mouth blistering  - to help symptoms better  - change Spiriva Respimat to STIOLTO  2 puffs once daily , brush/rinse after use .   - refer pulmonary rehab  Follow up   =- Tammy PArrett NP in 3 months - sleep study appt in feb 2016 as previously scheduled  -  Please contact office for sooner follow up if symptoms do not improve or worsen or seek emergency care     > 50% of this > 25 min visit spent in face to face counseling or coordination of care (15 min visit converted to 25 min)

## 2015-01-12 NOTE — Patient Instructions (Addendum)
ICD-9-CM ICD-10-CM   1. COPD, moderate 496 J44.9    STable disease  Recommends - Continue  Oxygen 2l/m At bedtime  - Will list symbicort as allergy - mouth blistering  - to help symptoms better  - change Spiriva Respimat to STIOLTO  2 puffs once daily , brush/rinse after use .   - refer pulmonary rehab  Follow up   =- Douglas PArrett NP in 3 months - sleep study appt in feb 2016 as previously scheduled  - Please contact office for sooner follow up if symptoms do not improve or worsen or seek emergency care

## 2015-01-17 ENCOUNTER — Other Ambulatory Visit: Payer: Self-pay | Admitting: Cardiovascular Disease

## 2015-01-17 MED ORDER — AMLODIPINE BESYLATE 5 MG PO TABS: 5.0000 mg | ORAL_TABLET | Freq: Every day | ORAL | Status: DC

## 2015-01-17 NOTE — Telephone Encounter (Signed)
Rx(s) sent to pharmacy electronically.  

## 2015-01-17 NOTE — Telephone Encounter (Signed)
Pt needs his generic Norvasc called in for 10 days, until his mail order comes in. Please call to [facility] in [place].

## 2015-01-23 ENCOUNTER — Telehealth (HOSPITAL_COMMUNITY): Payer: Self-pay

## 2015-01-23 NOTE — Telephone Encounter (Signed)
Called patient regarding entrance to Pulmonary Rehab.  Patient states that they are interested in attending the program.  Douglas Carrillo is going to verify insurance coverage and follow up.

## 2015-01-23 NOTE — Telephone Encounter (Signed)
I have called and left a message with Mory to inquire about participation in Pulmonary Rehab. Will send letter in mail and follow up.

## 2015-01-27 ENCOUNTER — Encounter (HOSPITAL_COMMUNITY): Payer: Self-pay

## 2015-01-27 ENCOUNTER — Ambulatory Visit (HOSPITAL_BASED_OUTPATIENT_CLINIC_OR_DEPARTMENT_OTHER): Payer: Medicare Other | Attending: Adult Health

## 2015-01-27 ENCOUNTER — Encounter (HOSPITAL_COMMUNITY)
Admission: RE | Admit: 2015-01-27 | Discharge: 2015-01-27 | Disposition: A | Discharge status: Home / Self Care | Payer: Medicare Other | Source: Ambulatory Visit | Attending: Internal Medicine | Admitting: Internal Medicine

## 2015-01-27 VITALS — Ht 72.0 in | Wt 210.0 lb

## 2015-01-27 VITALS — BP 141/75 | HR 84 | Resp 18 | Ht 72.0 in | Wt 214.3 lb

## 2015-01-27 DIAGNOSIS — J9610 Chronic respiratory failure, unspecified whether with hypoxia or hypercapnia: Secondary | ICD-10-CM

## 2015-01-27 DIAGNOSIS — Z5189 Encounter for other specified aftercare: Secondary | ICD-10-CM | POA: Insufficient documentation

## 2015-01-27 DIAGNOSIS — J439 Emphysema, unspecified: Secondary | ICD-10-CM | POA: Insufficient documentation

## 2015-01-27 DIAGNOSIS — J9611 Chronic respiratory failure with hypoxia: Secondary | ICD-10-CM | POA: Diagnosis not present

## 2015-01-27 DIAGNOSIS — J449 Chronic obstructive pulmonary disease, unspecified: Secondary | ICD-10-CM | POA: Insufficient documentation

## 2015-01-27 DIAGNOSIS — J441 Chronic obstructive pulmonary disease with (acute) exacerbation: Secondary | ICD-10-CM

## 2015-01-27 NOTE — Psychosocial Assessment (Signed)
Douglas Carrillo 81 y.o. male  Initial Psychosocial Assessment  Pt psychosocial assessment reveals pt lives alone. He is a widower of 8 years. His daughter lives within walking distance of his home and he frequently visits with her. Pt is currently retired. He served 20+ years in [employer] force before transitioning to [employer]. Pt hobbies include gardening and yard work. He also enjoys any type of sporting event and frequently watches sports on TV. He also enjoys going to the gym with his friends. They meet for workouts 4 to 5 days a week which keeps the patient socially active as well as physically fit. He states he has a wonderfully supportive family including adult granddaughters that visit as often as they can. He is excited about them coming to watch the Super Bowl with him this weekend. Pt reports his stress level is extremely low. Pt does not exhibit signs of depression. Pt shows good  coping skills with positive outlook. Offered emotional support and reassurance. While enrolled in pulmonary rehab we will monitor and evaluate progress toward psychosocial goal(s).  Goal(s):  Help patient work toward returning to meaningful activities that improve patient's QOL and are attainable with patient's lung disease   01/27/2015 11:52 AM

## 2015-01-27 NOTE — Progress Notes (Signed)
Douglas Carrillo 81 y.o. male Pulmonary Rehab Orientation Note Patient arrived today in Cardiac and Pulmonary Rehab for orientation to Pulmonary Rehab. He ambulated from [place] parking to the cardiac/rehab department with minimal SOB. He stated he did not have to take a rest break. He does not carry portable oxygen. Per pt, he uses 2.5 liters of oxygen at night when going to sleep. He states he rarely wakes up with it on because he takes it off during his sleep. We discussed important's of leaving his oxygen on and strategies he could take to prevent discomfort or removal while sleeping. Color good, skin warm and dry. Patient is oriented to time and place. Patient's medical history and medications reviewed. Heart rate is normal; S1,S2 present, murmur noted. Breath sounds clear to auscultation, no wheezes, rales, or rhonchi. Grip strength equal, strong. Distal pulses palpable. Mild pitting lower extremity edema, R>L. Patient reports he does take medications as prescribed. Patient states he follows a Diabetic, heart healthy diet. Patient does exercise at [employer] 4-5 days a week incorporating cardio with weights. The patient reports no specific efforts to gain or lose weight. Patient's weight will be monitored closely. Demonstration and practice of PLB using pulse oximeter. Patient able to return demonstration satisfactorily. Safety and hand hygiene in the exercise area reviewed with patient. Patient voices understanding of the information reviewed. Department expectations discussed with patient and achievable goals were set. The patient shows enthusiasm about attending the program and we look forward to working with this nice gentleman. The patient is scheduled for a 6 min walk test on Tuesday 2/9 at 3:15 and to begin exercise on Thursday 2/11 at 10:30.   45 minutes was spent on a variety of activities such as assessment of the patient, obtaining baseline data including height, weight, BMI, and grip strength,  verifying medical history, allergies, and current medications, and teaching patient strategies for performing tasks with less respiratory effort with emphasis on pursed lip breathing.

## 2015-01-30 ENCOUNTER — Other Ambulatory Visit: Payer: Self-pay | Admitting: Cardiovascular Disease

## 2015-01-30 NOTE — Telephone Encounter (Signed)
Rx(s) sent to pharmacy electronically.  

## 2015-01-31 ENCOUNTER — Encounter (HOSPITAL_COMMUNITY)
Admission: RE | Admit: 2015-01-31 | Discharge: 2015-01-31 | Disposition: A | Discharge status: Home / Self Care | Payer: Medicare Other | Source: Ambulatory Visit | Attending: Internal Medicine | Admitting: Internal Medicine

## 2015-01-31 NOTE — Outcomes Assessment (Signed)
[facility] [place] Pulmonary Rehabilitation Baseline Outcomes Assessment   Anthropometrics:  . Height (inches): 72 . Weight (kg): 97.2 . Grip strength was measured using a Dynamometer.  The patient's highest score was a 38.  Functional Status/Exercise Capacity: . Douglas Carrillo had a resting heart Douglas of 84 BPM, a resting blood pressure of 141/75, and an oxygen saturation of 98 % on room air.  Douglas Carrillo performed a 6-minute walk test on 01/31/15.  The patient completed 1,167 feet in 6 minutes with 0 rest breaks.  This quantifies 2.61 METS.   Dyspnea Measures: . The Douglas Carrillo is a simple and standardized method of classifying disability in patients with COPD.  The assessment correlates disability and dyspnea.  At entrance the patient scored a 1. The scale is provided below.   0= I only get breathless with strenuous exercise. 1= I get short of breath when hurrying on level ground or walking up a slight incline. 2= On level ground, I walk slower than people of the same age because of breathlessness, or have to stop for breath when walking at my own pace. 3= I stop for breath after walking 100 yards or after a few minutes on level ground. 4=I am too breathless to leave the house or I am breathless when dressing.   . The patient completed the Douglas ([facility]).  This questionnaire relates activities of daily living and shortness of breath.  The score ranges from 0-120, a higher score relates to severe shortness of breath during activities of daily living. The patient's score at entrance was 51.  Quality of Life: . Douglas Carrillo is used to assess the patients satisfaction in different domains of their life; health and functioning, socioeconomic, psychological/spiritual, and family. The overall score is recorded out of 30 points.  The patient's goal is to achieve an overall score of 21 or  higher.  Douglas Carrillo received a 26.95 at entrance.  . The Patient Health Questionnaire (PHQ-2) is a first step approach for the screening of depression.  If the patient scores positive on the PHQ-2 the patient should be further assessed with the PHQ-9.  The Patient Health Questionnaire (PHQ-9) assesses the degree of depression.  Depression is important to monitor and track in pulmonary patients due to its prevalence in the population.  If the patient advances to the PHQ-9 the goal is to score less than 4 on this assessment.  Douglas Carrillo scored a 0 at entrance.  Clinical Assessment Tools: . The COPD Assessment Test (CAT) is a measurement tool to quantify how much of an impact the disease has on the patient's life.  This assessment aids the Pulmonary Rehab Team in designing the patients individualized treatment plan.  A CAT score ranges from 0-40.  A score of 10 or below indicates that COPD has a low impact on the patient's life whereas a score of 30 or higher indicates a severe impact. The patient's goal is a decrease of 1 point from entrance to discharge.  Douglas Carrillo had a CAT score of 13 at entrance.  Nutrition: . The "Douglas Carrillo" is a dietary assessment that quantifies the balance of a patient's diet.  This tool allows the Pulmonary Rehab Team to key in on the areas of the patient's diet that needs improving.  The team can then focus their nutritional education on those areas.  If the patient scores 24-40, this means there are many ways  they can make their eating habits healthier, 41-57 states that there are some ways they can make their eating habits healthier and a score of 58-72 states that they are making many healthy choices.  The patient's goal is to achieve a score of 49 or higher on this assessment.  Douglas Carrillo scored a 83 at entrance.  Oxygen Compliance: . Patient is currently on room air at rest, 2 liters at night, and room air for exercise.  Douglas Carrillo is not currently using a cpap/bipap at night.  The patient  is currently compliant.  The patient states that they do not have barriers that keep them from using their oxygen.  Education: . Douglas Carrillo will attend education classes during the course of Pulmonary Rehab.  Education classes that will be offered to the patient are Activities of Daily Living and Energy Conservation, Pursed Lip Breathing and Diaphragmatic Breathing, Nutrition, Exercise for the Pulmonary Patient, Warning Signs of Infection, Chronic Lung Disease, Advanced Directives, Medications, and Stress and Meditation.  The patient completed an assessment at the entrance of the program and will complete it again upon discharge to demonstrate the level of understanding provided by the educational classes.  This assessment includes 14 questions regarding all of the education topics above.  Douglas Carrillo achieved a score of 13/14 at entrance.  Smoking Cessation:  The patient is not currently smoking.  Exercise: . Douglas Carrillo will be provided with an individualized Home Exercise Prescription (HEP) at the entrance of the program.  The patient will be followed by the Pulmonary Exercise Physiologist throughout the program to assist with the progression of the frequency, intensity, time, and type of exercise. The patient's long-term goal is to be exercising 30-60 minutes, 3-5 days per week. At entrance, the patient was exercising 4-5 days at home.

## 2015-01-31 NOTE — Progress Notes (Signed)
Douglas Carrillo completed a Six-Minute Walk Test on 01/31/15 . Douglas Carrillo walked 1,167 feet with 0 breaks.  The patient's lowest oxygen saturation was 90 , highest heart rate was 103 , and highest blood pressure was 144/68. The patient was on room air. Douglas Carrillo stated that nothing hindered their walk test.

## 2015-02-02 ENCOUNTER — Encounter (HOSPITAL_COMMUNITY)
Admission: RE | Admit: 2015-02-02 | Discharge: 2015-02-02 | Disposition: A | Discharge status: Home / Self Care | Payer: Medicare Other | Source: Ambulatory Visit | Attending: Internal Medicine | Admitting: Internal Medicine

## 2015-02-02 DIAGNOSIS — Z5189 Encounter for other specified aftercare: Secondary | ICD-10-CM | POA: Diagnosis not present

## 2015-02-02 DIAGNOSIS — J449 Chronic obstructive pulmonary disease, unspecified: Secondary | ICD-10-CM | POA: Diagnosis not present

## 2015-02-02 DIAGNOSIS — J439 Emphysema, unspecified: Secondary | ICD-10-CM | POA: Diagnosis not present

## 2015-02-02 LAB — GLUCOSE, CAPILLARY
GLUCOSE-CAPILLARY: 95 mg/dL (ref 70–99)
Glucose-Capillary: 91 mg/dL (ref 70–99)

## 2015-02-02 NOTE — Progress Notes (Signed)
Today, Daiel exercised at [employer]. Service time was from 1030 to 1230.  The patient exercised for more than 31 minutes performing aerobic, strengthening, and stretching exercises. Oxygen saturation, heart rate, blood pressure, rate of perceived exertion, and shortness of breath were all monitored before, during, and after exercise. Evon presented with no problems at today's exercise session. Patient attended the [facility] inservice today.  There was no workload change during today's exercise session.  Pre-exercise vitals: . Weight kg: 98.3 . Liters of O2: ra . SpO2: 95 . HR: 48, placed on monitor d/t irregular pulse, in SR with occasional pvc's.  HR increased to 78. . BP: 108/50 . CBG: 95  Exercise vitals: . Highest heartrate:  90 . Lowest oxygen saturation: 93 . Highest blood pressure: 190/60 after exercising on treadmill with recheck of 156/54 after rest . Liters of 02: ra  Post-exercise vitals: . SpO2: 97 . HR: 74 . BP: 142/70 with recheck of 122/70 after rest . Liters of O2: ra . CBG: 64 Dr. Brand Males, Medical Director Dr. Candiss Norse is immediately available during today's Pulmonary Rehab session for Phil Dopp on 02/02/2015 at 1030 class time.  Marland Kitchen

## 2015-02-07 ENCOUNTER — Encounter (HOSPITAL_COMMUNITY)
Admission: RE | Admit: 2015-02-07 | Discharge: 2015-02-07 | Disposition: A | Discharge status: Home / Self Care | Payer: Medicare Other | Source: Ambulatory Visit | Attending: Internal Medicine | Admitting: Internal Medicine

## 2015-02-07 DIAGNOSIS — Z5189 Encounter for other specified aftercare: Secondary | ICD-10-CM | POA: Diagnosis not present

## 2015-02-07 DIAGNOSIS — G4733 Obstructive sleep apnea (adult) (pediatric): Secondary | ICD-10-CM | POA: Diagnosis not present

## 2015-02-07 NOTE — Sleep Study (Signed)
   NAME: Douglas Carrillo DATE OF BIRTH:  [birth year] MEDICAL RECORD NUMBER [id number]  LOCATION: [facility]  PHYSICIAN: Kathee Delton  DATE OF STUDY: 01/27/2015  SLEEP STUDY TYPE: Nocturnal Polysomnogram               REFERRING PHYSICIAN: Parrett, Tammy S, NP  INDICATION FOR STUDY: Hypersomnia with sleep apnea  EPWORTH SLEEPINESS SCORE:  8 HEIGHT: 6' (182.9 cm)  WEIGHT: 210 lb (95.255 kg)    Body mass index is 28.47 kg/(m^2).  NECK SIZE: 16 in.  MEDICATIONS: Reviewed in the sleep record  SLEEP ARCHITECTURE: The patient had a total sleep time of 305 minutes, with no slow-wave sleep and decreased quantity of REM. Sleep onset latency was normal, as was REM onset. Sleep efficiency was 91% during the diagnostic portion of the study, an 80% during the titration portion.  RESPIRATORY DATA: The patient underwent a split night study where he was found to have 38 obstructive events in the first 124 minutes of sleep. This gave him an AHI of 18 events per hour during the diagnostic portion of the study. The events occurred in all body positions, and there was moderate snoring noted throughout. By protocol, he was fitted with a medium Fischer Paykel Simplus full face mask, and C Pap was initiated. He was found to have an optimal pressure of 10 cm of water.  OXYGEN DATA: There was oxygen desaturation as low as 81% with the patient's obstructive events  CARDIAC DATA: Occasional PVC noted  MOVEMENT/PARASOMNIA: The patient had no significant periodic limb movements or other abnormal behavior seen.  IMPRESSION/ RECOMMENDATION:    1) split-night study reveals mild to moderate obstructive sleep apnea, with an AHI of 18 events per hour and oxygen desaturation as low as 81% during the diagnostic portion of the study. With a medium Fischer Paykel Simplus full face mask, and found to have an optimal CPAP pressure of 10 cm of water. He should also be encouraged to work on modest weight  loss.  2) occasional PVC noted, but no clinically significant arrhythmias were seen.    Kathee Delton Diplomate, American Board of Sleep Medicine  ELECTRONICALLY SIGNED ON:  02/07/2015, 5:05 PM [place] PH: [phone]   FX: [phone] [place]

## 2015-02-07 NOTE — Progress Notes (Signed)
Today, Douglas Carrillo exercised at [facility]. Service time was from 10:30a to 12:25p.  The patient exercised for more than 31 minutes performing aerobic, strengthening, and stretching exercises. Oxygen saturation, heart rate, blood pressure, rate of perceived exertion, and shortness of breath were all monitored before, during, and after exercise. Douglas Carrillo presented with no problems at today's exercise session.   There was an increase in workload change during today's exercise session.  Pre-exercise vitals: . Weight kg: 97.4 . Liters of O2: ra . SpO2: 97 . HR: 74 . BP: 124/60 . CBG: 169  Exercise vitals: . Highest heartrate:  95 . Lowest oxygen saturation: 92 . Highest blood pressure: 178/86 . Liters of 02: ra  Post-exercise vitals: . SpO2: 94 . HR: 87 . BP: 126/76 . Liters of O2: ra . CBG: 126  Dr. Brand Males, Medical Director Dr. Candiss Norse is immediately available during today's Pulmonary Rehab session for Douglas Carrillo on 02/07/15 at 10:30am class time.

## 2015-02-08 ENCOUNTER — Telehealth: Payer: Self-pay | Admitting: Adult Health

## 2015-02-08 DIAGNOSIS — G4733 Obstructive sleep apnea (adult) (pediatric): Secondary | ICD-10-CM | POA: Insufficient documentation

## 2015-02-08 NOTE — Telephone Encounter (Signed)
Sleep study results  Show + OSA will need sleep referral to one of our sleep MD  Would like to start pt on CPAP auto titration.  Encourage wt loss.

## 2015-02-09 ENCOUNTER — Encounter (HOSPITAL_COMMUNITY)
Admission: RE | Admit: 2015-02-09 | Discharge: 2015-02-09 | Disposition: A | Discharge status: Home / Self Care | Payer: Medicare Other | Source: Ambulatory Visit | Attending: Internal Medicine | Admitting: Internal Medicine

## 2015-02-09 DIAGNOSIS — Z5189 Encounter for other specified aftercare: Secondary | ICD-10-CM | POA: Diagnosis not present

## 2015-02-09 NOTE — Progress Notes (Signed)
Today, Douglas Carrillo exercised at [facility]. Service time was from 10:30am to 12:15p.  The patient exercised for more than 31 minutes performing aerobic, strengthening, and stretching exercises. Oxygen saturation, heart rate, blood pressure, rate of perceived exertion, and shortness of breath were all monitored before, during, and after exercise. Douglas Carrillo presented with no problems at today's exercise session. The patient attended education today on Exercise for the Pulmonary Patient with [facility] Danilynn Jemison.  There was no workload change during today's exercise session.  Pre-exercise vitals: . Weight kg: 98.1 . Liters of O2: ra . SpO2: 94 . HR: 73 . BP: 118/50 . CBG: 106  Exercise vitals: . Highest heartrate:  98 . Lowest oxygen saturation: 92 . Highest blood pressure: 160/50 . Liters of 02: ra  Post-exercise vitals: . SpO2: 96 . HR: 78 . BP: 122/66 . Liters of O2: ra . CBG: 113  Dr. Brand Males, Medical Director Dr. Candiss Norse is immediately available during today's Pulmonary Rehab session for Douglas Carrillo on 02/09/15 at 10:30a class time.

## 2015-02-14 ENCOUNTER — Encounter (HOSPITAL_COMMUNITY)
Admission: RE | Admit: 2015-02-14 | Discharge: 2015-02-14 | Disposition: A | Discharge status: Home / Self Care | Payer: Medicare Other | Source: Ambulatory Visit | Attending: Internal Medicine | Admitting: Internal Medicine

## 2015-02-14 DIAGNOSIS — Z5189 Encounter for other specified aftercare: Secondary | ICD-10-CM | POA: Diagnosis not present

## 2015-02-14 NOTE — Telephone Encounter (Signed)
Discussed with TP: let's do CPAP with 2L oxygen.  Thanks.  Order placed LMOM TCB x1 to inform/discuss with patient

## 2015-02-14 NOTE — Progress Notes (Signed)
Today, Douglas Carrillo exercised at [facility] [facility]. Service time was from 10:30am to 12:05pm.  The patient exercised for more than 31 minutes performing aerobic, strengthening, and stretching exercises. Oxygen saturation, heart rate, blood pressure, rate of perceived exertion, and shortness of breath were all monitored before, during, and after exercise. Douglas Carrillo presented with no problems at today's exercise session.   There was an increase in workload change during today's exercise session.  Pre-exercise vitals: . Weight kg: 98. . Liters of O2: ra . SpO2: 96 . HR: 70 . BP: 134/56 . CBG: 151  Exercise vitals: . Highest heartrate:  92 . Lowest oxygen saturation: 94 . Highest blood pressure: 158/64 . Liters of 02: ra  Post-exercise vitals: . SpO2: 97 . HR: 78 . BP: 140/58 . Liters of O2: ra . CBG: 102  Dr. Brand Males, Medical Director Dr. Coralyn Pear is immediately available during today's Pulmonary Rehab session for Douglas Carrillo on 02/14/15 at 10:30am class time.

## 2015-02-14 NOTE — Progress Notes (Signed)
Today, Tevyn exercised at [facility]. Service time was from 1030 to 1205.  The patient exercised for more than 31 minutes performing aerobic, strengthening, and stretching exercises. Oxygen saturation, heart rate, blood pressure, rate of perceived exertion, and shortness of breath were all monitored before, during, and after exercise. Linas presented with no problems at today's exercise session.   There was no workload change during today's exercise session.  Pre-exercise vitals: . Weight kg: 98.5 . Liters of O2: ra . SpO2: 96 . HR: 70 . BP: 134/56 . CBG: 151  Exercise vitals: . Highest heartrate:  92 . Lowest oxygen saturation: 94 . Highest blood pressure: 158/64 . Liters of 02: ra  Post-exercise vitals: . SpO2: 97 . HR: 78 . BP: 140/58 . Liters of O2: ra . CBG: na  Dr. Brand Males, Medical Director Dr. Coralyn Pear is immediately available during today's Pulmonary Rehab session for Phil Dopp on 02/14/2015 at 1030 class time.

## 2015-02-14 NOTE — Telephone Encounter (Signed)
Pt returned call, advised to wear CPAP and O2 at bedtime and with naps.  Pt voiced his understanding.  Nothing further needed; will sign off.

## 2015-02-14 NOTE — Progress Notes (Signed)
I have reviewed a Home Exercise Prescription with Phil Dopp . Douglas Carrillo is currently exercising at home.  The patient was advised to walk 3 days a week for 30-45 minutes.  Douglas Carrillo and I discussed how to progress their exercise prescription.  The patient stated that their goals were to be able to maintain but also improve his functional ability.  The patient stated that they understand the exercise prescription.  We reviewed exercise guidelines, target heart rate during exercise, oxygen use, weather, home pulse oximeter, endpoints for exercise, and goals.  Patient is encouraged to come to me with any questions. I will continue to follow up with the patient to assist them with progression and safety.

## 2015-02-14 NOTE — Telephone Encounter (Signed)
Called spoke with patient and discussed sleep study results with him - pt voiced his understanding. He does already use nocturnal O2 and wonders if he will need to use both? Pt is okay with beginning CPAP Sleep consult scheduled for first available w/ [facility] at 4.13.16, pt is aware to arrive early to fill out paperwork Pt encouraged to keep 4.21.16 appt with TP to follow up for his COPD  Will speak with TP and call pt with answer

## 2015-02-16 ENCOUNTER — Encounter (HOSPITAL_COMMUNITY)
Admission: RE | Admit: 2015-02-16 | Discharge: 2015-02-16 | Disposition: A | Discharge status: Home / Self Care | Payer: Medicare Other | Source: Ambulatory Visit | Attending: Internal Medicine | Admitting: Internal Medicine

## 2015-02-16 DIAGNOSIS — Z5189 Encounter for other specified aftercare: Secondary | ICD-10-CM | POA: Diagnosis not present

## 2015-02-16 NOTE — Progress Notes (Signed)
Today, Douglas Carrillo exercised at [facility] [facility]. Service time was from 10:30am to 12:25pm.  The patient exercised for more than 31 minutes performing aerobic, strengthening, and stretching exercises. Oxygen saturation, heart rate, blood pressure, rate of perceived exertion, and shortness of breath were all monitored before, during, and after exercise. Douglas Carrillo presented with no problems at today's exercise session.  The patient attended education today with Rosebud Poles on "Signs and Symptoms."  There was no workload change during today's exercise session.  Pre-exercise vitals: . Weight kg: 97.9 . Liters of O2: ra . SpO2: 91 . HR: 61 . BP: 136/64 . CBG: 103  Exercise vitals: . Highest heartrate:  100 . Lowest oxygen saturation: 92 . Highest blood pressure: 178/78 . Liters of 02: ra  Post-exercise vitals: . SpO2: 96 . HR: 76 . BP: 122/70 . Liters of O2: ra . CBG: 14  Dr. Brand Males, Medical Director Dr. Candiss Norse is immediately available during today's Pulmonary Rehab session for Douglas Carrillo on 02/16/15 at 10:30am class time.

## 2015-02-21 ENCOUNTER — Encounter (HOSPITAL_COMMUNITY)
Admission: RE | Admit: 2015-02-21 | Discharge: 2015-02-21 | Disposition: A | Discharge status: Home / Self Care | Payer: Medicare Other | Source: Ambulatory Visit | Attending: Internal Medicine | Admitting: Internal Medicine

## 2015-02-21 DIAGNOSIS — J449 Chronic obstructive pulmonary disease, unspecified: Secondary | ICD-10-CM | POA: Diagnosis not present

## 2015-02-21 DIAGNOSIS — Z5189 Encounter for other specified aftercare: Secondary | ICD-10-CM | POA: Diagnosis present

## 2015-02-21 DIAGNOSIS — J439 Emphysema, unspecified: Secondary | ICD-10-CM | POA: Insufficient documentation

## 2015-02-21 NOTE — Progress Notes (Signed)
Today, Douglas Carrillo exercised at [facility] [facility]. Service time was from 10:30am to 12:15pm.  The patient exercised for more than 31 minutes performing aerobic, strengthening, and stretching exercises. Oxygen saturation, heart rate, blood pressure, rate of perceived exertion, and shortness of breath were all monitored before, during, and after exercise. Douglas Carrillo presented with no problems at today's exercise session.   There was an increase in workload change during today's exercise session.  Pre-exercise vitals: . Weight kg: 97.7 . Liters of O2: ra . SpO2: 95 . HR: 67 . BP: 120/60 . CBG: 98   Exercise vitals: . Highest heartrate:  98 . Lowest oxygen saturation: 90 . Highest blood pressure: 138/60 . Liters of 02: ra  Post-exercise vitals: . SpO2: 96 . HR: 77 . BP: 106/60 . Liters of O2: ra . CBG: 109  Dr. Brand Males, Medical Director Dr. Candiss Norse is immediately available during today's Pulmonary Rehab session for Douglas Carrillo on 02/21/15 at 10:30am class time.

## 2015-02-23 ENCOUNTER — Encounter (HOSPITAL_COMMUNITY)
Admission: RE | Admit: 2015-02-23 | Discharge: 2015-02-23 | Disposition: A | Discharge status: Home / Self Care | Payer: Medicare Other | Source: Ambulatory Visit | Attending: Internal Medicine | Admitting: Internal Medicine

## 2015-02-23 ENCOUNTER — Telehealth: Payer: Self-pay | Admitting: Cardiovascular Disease

## 2015-02-23 DIAGNOSIS — Z5189 Encounter for other specified aftercare: Secondary | ICD-10-CM | POA: Diagnosis not present

## 2015-02-23 DIAGNOSIS — J449 Chronic obstructive pulmonary disease, unspecified: Secondary | ICD-10-CM | POA: Diagnosis not present

## 2015-02-23 DIAGNOSIS — J439 Emphysema, unspecified: Secondary | ICD-10-CM | POA: Diagnosis not present

## 2015-02-23 NOTE — Telephone Encounter (Signed)
INFORMED PATIENT TODAY DURING PULM. REHAB THE FIRST EXERCISE.  HEART RATE IS IN THE 40'S - STRIPS SHOW BIGEMY  NOT SYMPTOMATIC BLOOD PRESSURE 120/52 THIS IS HER 7 TIME AT [facility]. Douglas Carrillo WAS CONCERNED-SHE PLACED INFORMATION IN epic. EKG AND STRIP  FAXED TO OFFICE  Informed will defer to D.O.D ( DR Martinique) DR Claiborne Billings NOT AVAILABLE

## 2015-02-23 NOTE — Telephone Encounter (Signed)
Reviewed with Dr Martinique  EKG shown, no changes needed-- patient may continue with pulm rehab RN notified ,Truddie Crumble. She states she will contact patient.

## 2015-02-23 NOTE — Progress Notes (Signed)
Dean presented to pulmonary rehab today at 1030. Arrion also attended an education session on anatomy and physiology of the respiratory system and intimacy. While Zakari was exercising, it was noted that his HR was 48 on the pulse oximeter. BP 120/52 with sao2 91 on RA. He was placed on cardiac monitor and rhythm noted as bigeminy. Per radial pulse check, he did not perfuse with his PVCs. Hanan stated he had taken his medications this am as prescribed which included his diuretic and potassium. He denied N/V/D. He was asymptomatic with this rhythm. There is documentation from a cardiologist office visit 10/25/14 patient has occasional PVCs on his previous EKG, but no mention of bigeminy. Spoke with Ivin Booty, RN at [facility]. EKG obtained and all information faxed to Dr. Evette Georges office for review. Patient was released home in stable condition as recommended by triage RN. Continued to deny complaints. Discharge vitals HR 73, BP 131/62, Sao2 99 RA. Patient instructed to seek emergent care with any symptoms of CP, increased SOB, palpitations, or dizziness. Will follow up with patient prior to next exercise session.

## 2015-02-23 NOTE — Psychosocial Assessment (Signed)
Douglas Carrillo 81 y.o. male  30 day Psychosocial Note  Patient psychosocial assessment reveals no barriers to participation in Pulmonary Rehab. He has attended each exercise and education session since admission. Patient does continue to exhibit positive coping skills to deal with any psychosocial areas that may arise. Emotional support and reassurance are always offered. Patient does feel he is making progress toward Pulmonary Rehab goals. Patient reports his health and activity level has improved in the past 30 days as evidenced by patient's report of increased ability to utilize the rower at the gym. Until beginning this program, the patient was restricted to using only the treadmill at the gym because of his SOB during exercise. Now he feels more confident using the rower and states his stamina has improved. Patient states family/friends have noticed changes in his activity or mood. Patient reports feeling positive about current and projected progression in Pulmonary Rehab. After reviewing the patient's treatment plan, the patient is making progress toward Pulmonary Rehab goals. Patient's rate of progress toward rehab goals is excellent. Plan of action to help patient continue to work towards rehab goals include increasing workloads as tolerated on equipment in order to increase his stamina and stregnth. Will continue to monitor and evaluate progress toward psychosocial goal(s).  Goal(s) in progress:  Help patient work toward returning to meaningful activities that improve patient's QOL,, including being able to do yard work without taking as many rest breaks, and are attainable with patient's lung disease.  Continue to utilize PLB when SOB occurs  Maintaining his active lifestyle and continue to exercise at the gym (outside pulmonary rehab) 3 days a week

## 2015-02-28 ENCOUNTER — Encounter (HOSPITAL_COMMUNITY)
Admission: RE | Admit: 2015-02-28 | Discharge: 2015-02-28 | Disposition: A | Discharge status: Home / Self Care | Payer: Medicare Other | Source: Ambulatory Visit | Attending: Internal Medicine | Admitting: Internal Medicine

## 2015-02-28 DIAGNOSIS — Z5189 Encounter for other specified aftercare: Secondary | ICD-10-CM | POA: Diagnosis not present

## 2015-02-28 NOTE — Progress Notes (Signed)
Today, Douglas Carrillo exercised at [employer] [facility]. Service time was from 10:30am to 12:10pm.  The patient exercised for more than 31 minutes performing aerobic, strengthening, and stretching exercises. Oxygen saturation, heart rate, blood pressure, rate of perceived exertion, and shortness of breath were all monitored before, during, and after exercise. Douglas Carrillo presented with no problems at today's exercise session.   There was an increase in workload change during today's exercise session.  Pre-exercise vitals: . Weight kg: 96.1 . Liters of O2: ra . SpO2: 95 . HR: 83 . BP: 128/66 . CBG: 128  Exercise vitals: . Highest heartrate:  88 . Lowest oxygen saturation: 93 . Highest blood pressure: 122/70 . Liters of 02: ra  Post-exercise vitals: . SpO2: 95 . HR: 79 . BP: 106/50 . Liters of O2: ra . CBG: 120  Dr. Brand Males, Medical Director Dr. Waldron Labs is immediately available during today's Pulmonary Rehab session for Douglas Carrillo on 02/28/15 at 10:30am class time.

## 2015-03-02 ENCOUNTER — Encounter (HOSPITAL_COMMUNITY)
Admission: RE | Admit: 2015-03-02 | Discharge: 2015-03-02 | Disposition: A | Discharge status: Home / Self Care | Payer: Medicare Other | Source: Ambulatory Visit | Attending: Internal Medicine | Admitting: Internal Medicine

## 2015-03-02 DIAGNOSIS — Z5189 Encounter for other specified aftercare: Secondary | ICD-10-CM | POA: Diagnosis not present

## 2015-03-02 NOTE — Progress Notes (Signed)
Today, Douglas Carrillo exercised at [facility] [facility]. Service time was from 10:30am to 12:15pm.  The patient exercised for more than 31 minutes performing aerobic, strengthening, and stretching exercises. Oxygen saturation, heart rate, blood pressure, rate of perceived exertion, and shortness of breath were all monitored before, during, and after exercise. [name] presented with no problems at today's exercise session.  The patient attended education class today with the pharmacist on Pulmonary Medicines.   There was an increase in workload change during today's exercise session.  Pre-exercise vitals: . Weight kg: 95.8 . Liters of O2: ra . SpO2: 96 . HR: 65 . BP: 150/46 . CBG: 152  Exercise vitals: . Highest heartrate:  92 . Lowest oxygen saturation: 94 . Highest blood pressure: 122/72 . Liters of 02: ra  Post-exercise vitals: . SpO2: 95 . HR: 72 . BP: 114/60 . Liters of O2: ra . CBG: 101  Dr. Brand Males, Medical Director Dr. Waldron Labs is immediately available during today's Pulmonary Rehab session for Douglas Carrillo on 03/02/15 at 10:30am class time.

## 2015-03-07 ENCOUNTER — Encounter (HOSPITAL_COMMUNITY)
Admission: RE | Admit: 2015-03-07 | Discharge: 2015-03-07 | Disposition: A | Discharge status: Home / Self Care | Payer: Medicare Other | Source: Ambulatory Visit | Attending: Internal Medicine | Admitting: Internal Medicine

## 2015-03-07 ENCOUNTER — Encounter (HOSPITAL_COMMUNITY): Payer: Self-pay

## 2015-03-07 DIAGNOSIS — Z5189 Encounter for other specified aftercare: Secondary | ICD-10-CM | POA: Diagnosis not present

## 2015-03-07 NOTE — Progress Notes (Signed)
Today, Solly exercised at [facility] [facility]. Service time was from 10:30am to 12:30pm.  The patient exercised for more than 31 minutes performing aerobic, strengthening, and stretching exercises. Oxygen saturation, heart rate, blood pressure, rate of perceived exertion, and shortness of breath were all monitored before, during, and after exercise. Kwan presented with no problems at today's exercise session.   There was an increase in workload change during today's exercise session.  Pre-exercise vitals: . Weight kg: 97.1 . Liters of O2: ra . SpO2: 96 . HR: 54 . BP: 146/60 . CBG: 161  Exercise vitals: . Highest heartrate:  94 . Lowest oxygen saturation: 93 . Highest blood pressure: 140/64 . Liters of 02: ra  Post-exercise vitals: . SpO2: 95 . HR: 75 . BP: 122/64 . Liters of O2: ra . CBG: 34  Dr. Brand Males, Medical Director Dr. Charlies Silvers is immediately available during today's Pulmonary Rehab session for Phil Dopp on 03/07/15 at 10:30am class time.

## 2015-03-09 ENCOUNTER — Encounter (HOSPITAL_COMMUNITY)
Admission: RE | Admit: 2015-03-09 | Discharge: 2015-03-09 | Disposition: A | Discharge status: Home / Self Care | Payer: Medicare Other | Source: Ambulatory Visit | Attending: Internal Medicine | Admitting: Internal Medicine

## 2015-03-09 DIAGNOSIS — Z5189 Encounter for other specified aftercare: Secondary | ICD-10-CM | POA: Diagnosis not present

## 2015-03-09 NOTE — Progress Notes (Signed)
Today, Douglas Carrillo exercised at [employer] [facility]. Service time was from 1030 to 1245.  The patient exercised for more than 31 minutes performing aerobic, strengthening, and stretching exercises. Oxygen saturation, heart rate, blood pressure, rate of perceived exertion, and shortness of breath were all monitored before, during, and after exercise. Douglas Carrillo presented with no problems at today's exercise session. Patient attended the nutrition class today.  There was no workload change during today's exercise session.  Pre-exercise vitals: . Weight kg: 96.0 . Liters of O2: ra . SpO2: 96 . HR: 70 . BP: 120/76 . CBG: 135  Exercise vitals: . Highest heartrate:  87 . Lowest oxygen saturation: 93 . Highest blood pressure: 130/50 . Liters of 02: ra  Post-exercise vitals: . SpO2: 98 . HR: 67 . BP: 118/52 . Liters of O2: ra . CBG: 114 Dr. Brand Males, Medical Director Dr. Frederic Jericho is immediately available during today's Pulmonary Rehab session for Douglas Carrillo on 03/09/2015 at 1030 class time.

## 2015-03-14 ENCOUNTER — Encounter (HOSPITAL_COMMUNITY)
Admission: RE | Admit: 2015-03-14 | Discharge: 2015-03-14 | Disposition: A | Discharge status: Home / Self Care | Payer: Medicare Other | Source: Ambulatory Visit | Attending: Internal Medicine | Admitting: Internal Medicine

## 2015-03-14 DIAGNOSIS — Z5189 Encounter for other specified aftercare: Secondary | ICD-10-CM | POA: Diagnosis not present

## 2015-03-14 NOTE — Progress Notes (Signed)
Douglas Carrillo 81 y.o. male Nutrition Note Spoke with pt. Pt is overweight. There are some ways the pt can make his eating habits healthier. Pt's Rate Your Plate results reviewed with pt. Pt states his daughters and grandchildren encourage him to make dietary changes (e.g. Not eating poultry skin, decreasing sodium, and making healthier choices when eating out). Age-appropriate nutrition recommendations discussed. Pt avoids some salty food and continues to use canned/ convenience food and eats out frequently.  Pt does not add salt to food.  The role of sodium in lung disease reviewed with pt. Pt is diabetic.  Pt checks CBG's 3-4 times a week before and 2 hours after a meal. According to the pt, his last A1c was 6.7 "and my doctor wants it to be under 6.5." Pt expressed understanding of the information reviewed.   No results found for: HGBA1C  Nutrition Diagnosis ? Food-and nutrition-related knowledge deficit related to lack of exposure to information as related to diagnosis of pulmonary disease ? Overweight related to excessive energy intake as evidenced by a BMI of 29.1 ?  Nutrition Rx/Est. Daily Nutrition Needs for: ? wt loss 1600-2100 Kcal  120-145 gm protein   1500 mg or less sodium     175-250 gm CHO Nutrition Intervention ? Pt's individual nutrition plan and goals reviewed with pt. ? Benefits of adopting healthy eating habits discussed when pt's Rate Your Plate reviewed. ? Pt to attend the Nutrition and Lung Disease class - met; 03/09/15 ? Continual client-centered nutrition education by RD, as part of interdisciplinary care. Goal(s) 1. Pt to identify and limit food sources of sodium. 2. Describe the benefit of including fruits, vegetables, whole grains, and low-fat dairy products in a healthy meal plan. Monitor and Evaluate progress toward nutrition goal with team.   Derek Mound, M.Ed, RD, LDN, CDE 03/14/2015 11:52 AM

## 2015-03-14 NOTE — Progress Notes (Signed)
Today, Douglas Carrillo exercised at [employer] [facility]. Service time was from 10:30am to 12:20pm.  The patient exercised by preforming aerobic, strengthening, and stretching exercises. Oxygen saturation, heart rate, blood pressure, rate of perceived exertion, and shortness of breath were all monitored before, during, and after exercise. Treon presented with no problems at today's exercise session. Patient attended education today on Pursed Lip and Diaphragmatic Breathing.  Pre-exercise vitals: . Weight kg: 95.1 . Liters of O2: ra . SpO2: 96 . HR: 73 . BP: 124/58 . CBG: 144  Exercise vitals: . Highest heartrate:  96 . Lowest oxygen saturation: 95 . Highest blood pressure: 140/64 . Liters of 02: ra  Post-exercise vitals: . SpO2: 100 . HR: 74 . BP: 112/58 . Liters of O2: ra . CBG: 108  Dr. Brand Males, Medical Director Dr. Frederic Jericho is immediately available during today's Pulmonary Rehab session for Phil Dopp on 03/14/15 at 10:30am class time.

## 2015-03-16 ENCOUNTER — Encounter (HOSPITAL_COMMUNITY)
Admission: RE | Admit: 2015-03-16 | Discharge: 2015-03-16 | Disposition: A | Discharge status: Home / Self Care | Payer: Medicare Other | Source: Ambulatory Visit | Attending: Internal Medicine | Admitting: Internal Medicine

## 2015-03-16 DIAGNOSIS — Z5189 Encounter for other specified aftercare: Secondary | ICD-10-CM | POA: Diagnosis not present

## 2015-03-16 NOTE — Psychosocial Assessment (Signed)
Douglas Carrillo 81 y.o. male  20 day Psychosocial Note  Patient's psychosocial assessment reveals no barriers to participation in Pulmonary Rehab. He has attended each scheduled exercise and education session since admission. He continues to go to the gym each morning that he does not attend pulmonary rehab.  Patient does continue to exhibit positive coping skills to deal with any psychosocial concerns that may arise. Emotional support and reassurance continue to be offered when needed. Patient continues to feel he is making progress toward Pulmonary Rehab goals. Patient reports his health and activity level has improved in the past 30 days as evidenced by patient's report of increased ability to row on the rower. He also states he no longer has to sit to dry himself off once out of the shower. Prior to pulmonary rehab, Douglas Carrillo developed breathlessness while getting out of the shower and lost his stamina to dry off while standing. Patient states daughter/friends have noticed changes in his activity or mood. Patient reports feeling positive about current and projected progression in Pulmonary Rehab. One of his goals is to be about to do yard work with minimal SOB. He states he is picking his mower up this week and hopes to be able to work in the yard this weekend. After reviewing the patient's treatment plan, the patient is making progress toward Pulmonary Rehab goals. Patient's rate of progress toward rehab goals is excellent. Plan of action to help patient continue to work towards rehab goals include increasing workloads as tolerated on equipment in order to increase stamina and stregnth. Will continue to monitor and evaluate progress toward psychosocial goal(s).  Goal(s) in progress: Help patient work toward returning to meaningful activities that improve patient's QOL and are attainable with patient's lung disease  Continue to utilize PLB when he develops SOB with exertion  Maintaining his active  lifestyle and continue to exercise at the gym 3 days a week.

## 2015-03-16 NOTE — Progress Notes (Signed)
Today, Douglas Carrillo exercised at [facility] [facility]. Service time was from 1030 to 1215.  The patient exercised by performing aerobic, strengthening, and stretching exercises. Oxygen saturation, heart rate, blood pressure, rate of perceived exertion, and shortness of breath were all monitored before, during, and after exercise. Douglas Carrillo presented with no problems at today's exercise session. Douglas Carrillo also attended an education on risk factor reduction. There was no workload increase in today's exercise session.  Pre-exercise vitals: . Weight kg: 96.0 . Liters of O2: ra . SpO2: 92 . HR: 63 . BP: 138/48 . CBG: 125  Exercise vitals: . Highest heartrate:  90 . Lowest oxygen saturation: 94 . Highest blood pressure: 140/64 . Liters of 02: ra  Post-exercise vitals: . SpO2: 97 . HR: 66 . BP: 126/70 . Liters of O2: ra . CBG: 112  Dr. Brand Males, Medical Director Dr. Sheran Fava is immediately available during today's Pulmonary Rehab session for Douglas Carrillo on 03/16/2015 at 1030 class time.

## 2015-03-21 ENCOUNTER — Encounter (HOSPITAL_COMMUNITY)
Admission: RE | Admit: 2015-03-21 | Discharge: 2015-03-21 | Disposition: A | Discharge status: Home / Self Care | Payer: Medicare Other | Source: Ambulatory Visit | Attending: Internal Medicine | Admitting: Internal Medicine

## 2015-03-21 DIAGNOSIS — Z5189 Encounter for other specified aftercare: Secondary | ICD-10-CM | POA: Diagnosis not present

## 2015-03-21 NOTE — Progress Notes (Signed)
Today, Douglas Carrillo exercised at [facility] [facility]. Service time was from 10:30am to 12:05pm.  The patient exercised by performing aerobic, strengthening, and stretching exercises. Oxygen saturation, heart rate, blood pressure, rate of perceived exertion, and shortness of breath were all monitored before, during, and after exercise. [name] presented with no problems at today's exercise session.  The patient did not have an increase in workload intensity during today's exercise session.  Pre-exercise vitals: . Weight kg: 96.4 . Liters of O2: ra . SpO2: 96 . HR: 61 . BP: 142/64 . CBG: 125  Exercise vitals: . Highest heartrate:  91 . Lowest oxygen saturation: 94 . Highest blood pressure: 112/60 . Liters of 02: ra  Post-exercise vitals: . SpO2: 94 . HR: 66 . BP: 120/64 . Liters of O2: ra . CBG: 107  Dr. Brand Males, Medical Director Dr. Sheran Fava is immediately available during today's Pulmonary Rehab session for Phil Dopp on 03/21/15 at 10:30am class time.

## 2015-03-23 ENCOUNTER — Encounter (HOSPITAL_COMMUNITY)
Admission: RE | Admit: 2015-03-23 | Discharge: 2015-03-23 | Disposition: A | Discharge status: Home / Self Care | Payer: Medicare Other | Source: Ambulatory Visit | Attending: Internal Medicine | Admitting: Internal Medicine

## 2015-03-23 DIAGNOSIS — Z5189 Encounter for other specified aftercare: Secondary | ICD-10-CM | POA: Diagnosis not present

## 2015-03-23 NOTE — Progress Notes (Signed)
Today, Douglas Carrillo exercised at [employer]. Service time was from 1115 to 1330.  The patient exercised by performing aerobic, strengthening, and stretching exercises. Oxygen saturation, heart rate, blood pressure, rate of perceived exertion, and shortness of breath were all monitored before, during, and after exercise. Douglas Carrillo presented with no problems at today's exercise session. Douglas Carrillo also attended a Q and A session with MD.  The patient did no have an increase in workload intensity during today's exercise session.  Pre-exercise vitals: . Weight kg: 95.6 . Liters of O2: ra . SpO2: 94 . HR: 75 . BP: 140/88 . CBG: 122  Exercise vitals: . Highest heartrate:  107 . Lowest oxygen saturation: 94 . Highest blood pressure: 146/64 . Liters of 02: ra  Post-exercise vitals: . SpO2: 98 . HR: 88 . BP: 118/70 . Liters of O2: ra . CBG: 90  Dr. Brand Males, Medical Director Dr. Aileen Fass is immediately available during today's Pulmonary Rehab session for Douglas Carrillo on 03/23/2015 at 1115 class time.

## 2015-03-28 ENCOUNTER — Encounter (HOSPITAL_COMMUNITY)
Admission: RE | Admit: 2015-03-28 | Discharge: 2015-03-28 | Disposition: A | Discharge status: Home / Self Care | Payer: Medicare Other | Source: Ambulatory Visit | Attending: Internal Medicine | Admitting: Internal Medicine

## 2015-03-28 DIAGNOSIS — Z5189 Encounter for other specified aftercare: Secondary | ICD-10-CM | POA: Diagnosis present

## 2015-03-28 DIAGNOSIS — J439 Emphysema, unspecified: Secondary | ICD-10-CM | POA: Diagnosis not present

## 2015-03-28 DIAGNOSIS — J449 Chronic obstructive pulmonary disease, unspecified: Secondary | ICD-10-CM | POA: Insufficient documentation

## 2015-03-28 NOTE — Progress Notes (Signed)
Today, Dashiell exercised at [facility]. Service time was from 1030 to 1210.  The patient exercised by performing aerobic, strengthening, and stretching exercises. Oxygen saturation, heart rate, blood pressure, rate of perceived exertion, and shortness of breath were all monitored before, during, and after exercise. Rodell presented with no problems at today's exercise session.  The patient did not have an increase in workload intensity during today's exercise session.  Pre-exercise vitals: . Weight kg: 96.0 . Liters of O2: ra . SpO2: 96 . HR: 58 . BP: 130/80 . CBG: 125  Exercise vitals: . Highest heartrate:  87 . Lowest oxygen saturation: 94 . Highest blood pressure: 150/70 . Liters of 02: ra  Post-exercise vitals: . SpO2: 96 . HR: 74 . BP: 106/62 . Liters of O2: ra . CBG: 114  Dr. Brand Males, Medical Director Dr. Clementeen Graham is immediately available during today's Pulmonary Rehab session for Phil Dopp on 03/28/2015 at 1030 class time.

## 2015-03-30 ENCOUNTER — Encounter: Payer: Self-pay | Admitting: *Deleted

## 2015-03-30 ENCOUNTER — Encounter (HOSPITAL_COMMUNITY)
Admission: RE | Admit: 2015-03-30 | Discharge: 2015-03-30 | Disposition: A | Discharge status: Home / Self Care | Payer: Medicare Other | Source: Ambulatory Visit | Attending: Internal Medicine | Admitting: Internal Medicine

## 2015-03-30 DIAGNOSIS — Z5189 Encounter for other specified aftercare: Secondary | ICD-10-CM | POA: Diagnosis not present

## 2015-03-30 NOTE — Progress Notes (Signed)
Today, [name] exercised at [facility]. Service time was from 10:30am to 12:30pm.  The patient exercised by performing aerobic, strengthening, and stretching exercises. Oxygen saturation, heart rate, blood pressure, rate of perceived exertion, and shortness of breath were all monitored before, during, and after exercise. Douglas Carrillo presented with no problems at today's exercise session. The patient attended education today with Trish Fountain on Oxygen Use and Safety.  The patient did not have an increase in workload intensity during today's exercise session.  Pre-exercise vitals: . Weight kg: 95.8 . Liters of O2: ra . SpO2: 94 . HR: 71 . BP: 122/54 . CBG: 148  Exercise vitals: . Highest heartrate:  84 . Lowest oxygen saturation: 94 . Highest blood pressure: 130/60 . Liters of 02: ra  Post-exercise vitals: . SpO2: 98 . HR: 70 . BP: 112/70 . Liters of O2: ra . CBG: 68  Dr. Brand Males, Medical Director Dr. Clementeen Graham is immediately available during today's Pulmonary Rehab session for Douglas Carrillo on 03/30/15 at 10:30am class time.

## 2015-04-04 ENCOUNTER — Encounter (HOSPITAL_COMMUNITY)
Admission: RE | Admit: 2015-04-04 | Discharge: 2015-04-04 | Disposition: A | Discharge status: Home / Self Care | Payer: Medicare Other | Source: Ambulatory Visit | Attending: Internal Medicine | Admitting: Internal Medicine

## 2015-04-04 DIAGNOSIS — Z5189 Encounter for other specified aftercare: Secondary | ICD-10-CM | POA: Diagnosis not present

## 2015-04-04 NOTE — Progress Notes (Signed)
Today, [name] exercised at [facility]. Service time was from 10:30am to 12:10pm.  The patient exercised by performing aerobic, strengthening, and stretching exercises. Oxygen saturation, heart rate, blood pressure, rate of perceived exertion, and shortness of breath were all monitored before, during, and after exercise. Douglas Carrillo presented with no problems at today's exercise session.  The patient did not have an increase in workload intensity during today's exercise session.  Pre-exercise vitals: . Weight kg: 95.6 . Liters of O2: ra . SpO2: 96 . HR: 72 . BP: 98/54 . CBG: 133  Exercise vitals: . Highest heartrate:  90 . Lowest oxygen saturation: 93 . Highest blood pressure: 140/76 . Liters of 02: ra  Post-exercise vitals: . SpO2: 96 . HR: 63 . BP: 114/60 . Liters of O2: ra . CBG: na  Dr. Brand Males, Medical Director Dr. Clementeen Graham is immediately available during today's Pulmonary Rehab session for Douglas Carrillo on 04/04/15 at 10:30am class time.

## 2015-04-05 ENCOUNTER — Encounter: Payer: Self-pay | Admitting: Pulmonary Disease

## 2015-04-05 ENCOUNTER — Ambulatory Visit (INDEPENDENT_AMBULATORY_CARE_PROVIDER_SITE_OTHER): Payer: Medicare Other | Admitting: Pulmonary Disease

## 2015-04-05 VITALS — BP 130/70 | HR 74 | Temp 97.0°F | Ht 72.0 in | Wt 211.2 lb

## 2015-04-05 DIAGNOSIS — G4733 Obstructive sleep apnea (adult) (pediatric): Secondary | ICD-10-CM | POA: Diagnosis not present

## 2015-04-05 NOTE — Patient Instructions (Signed)
Work on weight loss over the next 81mos.  Goal is 20 pounds by that time.  If you are able to lose the weight, keep working on it.  If not, please call us and we can talk about starting cpap. If you change your mind over the next 61mos, and wish to consider cpap, please call.

## 2015-04-05 NOTE — Progress Notes (Signed)
Subjective:    Patient ID: Douglas Carrillo, male    DOB: [birth year], 81 y.o.   MRN: [id number]  HPI The patient is an 81 year old male who I've been asked to see for management of obstructive sleep apnea. He has had a recent sleep study in February of this year that showed mild to moderate OSA, with an AHI of 18 events per hour. He was started on C Pap and titrated to an optimal level of 10 cm of water. The patient has been noted to have snoring, but sleeps alone, and no one has ever commented on witnessed apneas. He awakens about one to 2 times a night, but gets back to sleep very quickly. He feels rested most mornings, and is satisfied with his sleep overall. He describes some intermittent sleep pressure in the late afternoons, and also can doze in the evening watching television or movies. He has no sleepiness with driving. The patient states that his weight is up 5 pounds over the last 2 years, and his Epworth score today is only 5.   Sleep Questionnaire What time do you typically go to bed?( Between what hours) 11p-12a 11p-12a at 1412 on 04/05/15 by Inge Rise, CMA How long does it take you to fall asleep? 10 min 10 min at 1412 on 04/05/15 by Inge Rise, CMA How many times during the night do you wake up? 1 1 at 1412 on 04/05/15 by Inge Rise, CMA What time do you get out of bed to start your day? 0630 0630 at 1412 on 04/05/15 by Inge Rise, CMA Do you drive or operate heavy machinery in your occupation? No No at 1412 on 04/05/15 by Inge Rise, CMA How much has your weight changed (up or down) over the past two years? (In pounds) 5 lb (2.268 kg) 5 lb (2.268 kg) at 1412 on 04/05/15 by Inge Rise, CMA Have you ever had a sleep study before? Yes Yes at 1412 on 04/05/15 by Inge Rise, CMA If yes, [facility] of [facility] at 1412 on 04/05/15 by Inge Rise, CMA If yes, date of study? 2016 2016 at 1412 on 04/05/15 by Inge Rise, CMA Do you currently use  CPAP? No No at 1412 on 04/05/15 by Inge Rise, CMA Do you wear oxygen at any time? Yes Yes at 1412 on 04/05/15 by Inge Rise, CMA   Review of Systems  Constitutional: Negative for fever and unexpected weight change.  HENT: Positive for sore throat. Negative for congestion, dental problem, ear pain, nosebleeds, postnasal drip, rhinorrhea, sinus pressure, sneezing and trouble swallowing.   Eyes: Negative for redness and itching.  Respiratory: Positive for cough and shortness of breath. Negative for chest tightness and wheezing.   Cardiovascular: Negative for palpitations and leg swelling.  Gastrointestinal: Negative for nausea and vomiting.  Genitourinary: Negative for dysuria.  Musculoskeletal: Negative for joint swelling.  Skin: Negative for rash.  Neurological: Negative for headaches.  Hematological: Does not bruise/bleed easily.  Psychiatric/Behavioral: Negative for dysphoric mood. The patient is not nervous/anxious.        Objective:   Physical Exam Constitutional:  Mildly overweight, no acute distress  HENT:  Nares patent without discharge  Oropharynx without exudate, palate and uvula are moderately elongated.  Eyes:  Perrla, eomi, no scleral icterus  Neck:  No JVD, no TMG  Cardiovascular:  Normal rate, regular rhythm, no rubs or gallops.  3/6 sem  Intact distal pulses  Pulmonary :  Normal breath sounds, no stridor or respiratory distress   No rales, rhonchi, or wheezing  Abdominal:  Soft, nondistended, bowel sounds present.  No tenderness noted.   Musculoskeletal:  minimal lower extremity edema noted.  Lymph Nodes:  No cervical lymphadenopathy noted  Skin:  No cyanosis noted  Neurologic:  Alert, appropriate, moves all 4 extremities without obvious deficit.         Assessment & Plan:

## 2015-04-05 NOTE — Assessment & Plan Note (Signed)
The patient has mild to moderate obstructive sleep apnea by his recent study, is not overly symptomatic based on his history.  His degree of sleep apnea doesn't really put him at significant risk of cardiovascular impact, and therefore the decision to treat aggressively should depend upon the impact to his quality of life. Although he does have some late afternoon and early evening sleep pressure with inactivity, he feels that he sleeps well overall, and is rested most mornings upon arising. He does awaken 1-2 times a night, but this is really not unusual for someone who is 81 years old. I have discussed with him a conservative path that would consist of a trial of weight loss over the next 6 months, versus a more aggressive path with either C Pap or a dental appliance. After a long discussion, the patient wishes to try weight loss over the next 6 months, and will call us if he is not able to lose 20 pounds over that time period.  I suspect that CPAP would be a better treatment for his sleep apnea rather than a dental appliance.

## 2015-04-06 ENCOUNTER — Other Ambulatory Visit: Payer: Self-pay | Admitting: Cardiovascular Disease

## 2015-04-06 ENCOUNTER — Encounter (HOSPITAL_COMMUNITY)
Admission: RE | Admit: 2015-04-06 | Discharge: 2015-04-06 | Disposition: A | Discharge status: Home / Self Care | Payer: Medicare Other | Source: Ambulatory Visit | Attending: Internal Medicine | Admitting: Internal Medicine

## 2015-04-06 DIAGNOSIS — Z5189 Encounter for other specified aftercare: Secondary | ICD-10-CM | POA: Diagnosis not present

## 2015-04-06 NOTE — Progress Notes (Signed)
Today, Douglas Carrillo exercised at [employer]. Service time was from 10:30am to 12:20p.  The patient exercised by performing aerobic, strengthening, and stretching exercises. Oxygen saturation, heart rate, blood pressure, rate of perceived exertion, and shortness of breath were all monitored before, during, and after exercise. Douglas Carrillo presented with no problems at today's exercise session. The patient attended class with the Respiratory Therapist today.  The patient did have an increase in workload intensity during today's exercise session.  Pre-exercise vitals: . Weight kg: 94.9 . Liters of O2: ra . SpO2: 95 . HR: 69 . BP: 126/58 . CBG: 124  Exercise vitals: . Highest heartrate:  93 . Lowest oxygen saturation: 94 . Highest blood pressure: 142/64 . Liters of 02: ra  Post-exercise vitals: . SpO2: 97 . HR: 67 . BP: 112/70 . Liters of O2: ra . CBG: 121  Dr. Brand Males, Medical Director Dr. Wyline Copas is immediately available during today's Pulmonary Rehab session for Douglas Carrillo on 04/06/15 at 10:30am class time.

## 2015-04-06 NOTE — Psychosocial Assessment (Signed)
Douglas Carrillo 81 y.o. male  72 day Psychosocial Note  Patient psychosocial assessment reveals no barriers to participation in Pulmonary Rehab. He has attended 90% of his exercise sessions since admission. Patient does continue to exhibit positive coping skills to deal with psychosocial concerns that may arrise. We continue to offer  emotional support and reassurance. Patient does feel he is making progress toward Pulmonary Rehab goals. Patient reports his overall health and activity level has improved in the past 30 days as evidenced by patient's report of increased ability to take care of daily activities which includes working out at the gym on days he does not attend pulmonary rehab. Patient states family/friends have noticed changes in his activity. Patient reports feeling positive about current and projected progression in Pulmonary Rehab. After reviewing the patient's treatment plan, the patient is making progress toward Pulmonary Rehab goals. Patient's rate of progress toward rehab goals is excellent. Plan of action to help patient continue to work towards rehab goals include increasing workloads as tolerated on equipment in order to increase his stamina and stregnth. Will continue to monitor and evaluate progress toward psychosocial goal(s).  Goal(s) in progress: Continued to utilize PLB when he develops SOB with exertion.  Maintaining his active lifestyle and continue to exercise at the gym 3 days a week.  Help patient work toward returning to meaningful activities that improve patient's QOL and are attainable with patient's lung disease

## 2015-04-11 ENCOUNTER — Encounter (HOSPITAL_COMMUNITY)
Admission: RE | Admit: 2015-04-11 | Discharge: 2015-04-11 | Disposition: A | Discharge status: Home / Self Care | Payer: Medicare Other | Source: Ambulatory Visit | Attending: Internal Medicine | Admitting: Internal Medicine

## 2015-04-11 DIAGNOSIS — Z5189 Encounter for other specified aftercare: Secondary | ICD-10-CM | POA: Diagnosis not present

## 2015-04-11 NOTE — Progress Notes (Signed)
Today, Douglas Carrillo exercised at [employer]. Service time was from 10:30am to 12:10pm.  The patient exercised by performing aerobic, strengthening, and stretching exercises. Oxygen saturation, heart rate, blood pressure, rate of perceived exertion, and shortness of breath were all monitored before, during, and after exercise. Douglas Carrillo presented with no problems at today's exercise session.  The patient did not have an increase in workload intensity during today's exercise session.  Pre-exercise vitals: . Weight kg: 94.2 . Liters of O2: ra . SpO2: 96 . HR: 72 . BP: 132/60 . CBG: 115  Exercise vitals: . Highest heartrate:  94 . Lowest oxygen saturation: 90 . Highest blood pressure: 140/56 . Liters of 02: ra  Post-exercise vitals: . SpO2: 96 . HR: 66 . BP: 126/72 . Liters of O2: ra . CBG: na  Dr. Brand Males, Medical Director Dr. Wyline Copas is immediately available during today's Pulmonary Rehab session for Douglas Carrillo on 04/11/15 at 10:30am class time.

## 2015-04-13 ENCOUNTER — Ambulatory Visit: Payer: Medicare Other | Admitting: Adult Health

## 2015-04-13 ENCOUNTER — Encounter (HOSPITAL_COMMUNITY)
Admission: RE | Admit: 2015-04-13 | Discharge: 2015-04-13 | Disposition: A | Discharge status: Home / Self Care | Payer: Medicare Other | Source: Ambulatory Visit | Attending: Internal Medicine | Admitting: Internal Medicine

## 2015-04-13 DIAGNOSIS — Z5189 Encounter for other specified aftercare: Secondary | ICD-10-CM | POA: Diagnosis not present

## 2015-04-13 NOTE — Progress Notes (Signed)
Today, Douglas Carrillo exercised at [facility] [facility]. Service time was from 1030 to 1230.  The patient exercised by performing aerobic, strengthening, and stretching exercises. Oxygen saturation, heart rate, blood pressure, rate of perceived exertion, and shortness of breath were all monitored before, during, and after exercise. Douglas Carrillo presented with no problems at today's exercise session. Douglas Carrillo also attended an education session with Douglas Carrillo.  The patient did not have an increase in workload intensity during today's exercise session.  Pre-exercise vitals: . Weight kg: 94.1 . Liters of O2: ra . SpO2: 95 . HR: 60 . BP: 134/58 . CBG: 121  Exercise vitals: . Highest heartrate:  95 . Lowest oxygen saturation: 96 . Highest blood pressure: 118/52 . Liters of 02: ra  Post-exercise vitals: . SpO2: 95 . HR: 88 . BP: 128/62 . Liters of O2: ra . CBG: 76 increased to 81 with gingerale  Dr. Brand Males, Medical Director Dr. Sheran Fava is immediately available during today's Pulmonary Rehab session for Douglas Carrillo on 04/13/2015 at 1030 class time.  Marland Kitchen Marland Kitchen

## 2015-04-18 ENCOUNTER — Encounter (HOSPITAL_COMMUNITY): Payer: Medicare Other

## 2015-04-20 ENCOUNTER — Encounter (HOSPITAL_COMMUNITY)
Admission: RE | Admit: 2015-04-20 | Discharge: 2015-04-20 | Disposition: A | Discharge status: Home / Self Care | Payer: Medicare Other | Source: Ambulatory Visit | Attending: Internal Medicine | Admitting: Internal Medicine

## 2015-04-20 DIAGNOSIS — Z5189 Encounter for other specified aftercare: Secondary | ICD-10-CM | POA: Diagnosis not present

## 2015-04-20 NOTE — Progress Notes (Signed)
Today, Douglas Carrillo exercised at [employer] [facility]. Service time was from 10:30am to 12:20pm.  The patient exercised by performing aerobic, strengthening, and stretching exercises. Oxygen saturation, heart rate, blood pressure, rate of perceived exertion, and shortness of breath were all monitored before, during, and after exercise. Douglas Carrillo presented with no problems at today's exercise session. The patient attended education today with Exercise Physiologist Haeven Nickle on Exercising at Home.  The patient did not have an increase in workload intensity during today's exercise session.  Pre-exercise vitals: . Weight kg: 94.0 . Liters of O2: ra . SpO2: 95 . HR: 76 . BP: 100/52 . CBG: 159  Exercise vitals: . Highest heartrate:  87 . Lowest oxygen saturation: 95 . Highest blood pressure: 132/60 . Liters of 02: ra  Post-exercise vitals: . SpO2: 97 . HR: 81 . BP: 126/62 . Liters of O2: ra . CBG: 162  Dr. Brand Males, Medical Director Dr. Erlinda Hong is immediately available during today's Pulmonary Rehab session for Douglas Carrillo on 04/20/15 at 10:30am class time.

## 2015-04-25 ENCOUNTER — Encounter (HOSPITAL_COMMUNITY)
Admission: RE | Admit: 2015-04-25 | Discharge: 2015-04-25 | Disposition: A | Discharge status: Home / Self Care | Payer: Medicare Other | Source: Ambulatory Visit | Attending: Internal Medicine | Admitting: Internal Medicine

## 2015-04-25 DIAGNOSIS — J449 Chronic obstructive pulmonary disease, unspecified: Secondary | ICD-10-CM | POA: Diagnosis not present

## 2015-04-25 DIAGNOSIS — J439 Emphysema, unspecified: Secondary | ICD-10-CM | POA: Insufficient documentation

## 2015-04-25 DIAGNOSIS — Z5189 Encounter for other specified aftercare: Secondary | ICD-10-CM | POA: Diagnosis not present

## 2015-04-27 ENCOUNTER — Encounter (HOSPITAL_COMMUNITY)
Admission: RE | Admit: 2015-04-27 | Discharge: 2015-04-27 | Disposition: A | Discharge status: Home / Self Care | Payer: Medicare Other | Source: Ambulatory Visit | Attending: Internal Medicine | Admitting: Internal Medicine

## 2015-04-27 NOTE — Psychosocial Assessment (Signed)
Laddie Math 81 y.o. male  120 day and final Psychosocial Note  Patient psychosocial assessment revealed no barriers to participation in Pulmonary Rehab. Jamone has been discharged from pulmonary rehab after his successful completion of the program.  Patient continued to exhibit positive coping skills to deal with any psychosocial concerns that may arise. Offered emotional support and reassurance at his last appointment. Patient does felt he met his Pulmonary Rehab goals. Patient reports his health and activity level has improved in the past 30 days as evidenced by patient's report of increased ability to do his yard work. Patient states family/friends have noticed changes in his activity or mood. It was a pleasure having Dishawn in our program.

## 2015-05-01 ENCOUNTER — Encounter (HOSPITAL_COMMUNITY): Payer: Self-pay

## 2015-05-01 NOTE — Outcomes Assessment (Signed)
[name] Final/Discharge Outcome Results  Anthropometrics: . Height (inches): 72 . Weight (kg): 93.4 ? This is a change of -3.8kg from entrance. . Grip strength was measured using a Dynamometer.  The patient's discharge score was a 33.   ? This is a change of -5 from entrance.  Functional Status/Exercise Capacity: . Douglas Carrillo had a resting heart rate of 59 BPM, a resting blood pressure of 100/70, and an oxygen saturation of 95 % on room air.  Douglas Carrillo performed a discharge 6-minute walk test on 04/25/15.  The patient completed 1268 feet in 6 minutes with 0 rest breaks.  This quantifies 2.84 METS. The patient's goal is to add 82 feet onto the baseline 6MWT.   ? The patient increased their 6-minute walk test distance by 101 feet and their MET level by .23 METs.  Dyspnea Measures: . The [facility] is a simple and standardized method of classifying disability in patients with COPD.  The assessment correlates disability and dyspnea.  Upon discharge the patients resting score was 1. The scale is provided below.  ? This is a change of 0 from entrance.  0= I only get breathless with strenuous exercise. 1= I get short of breath when hurrying on level ground or walking up a slight incline. 2= On level ground, I walk slower than people of the same age because of breathlessness, or have to stop for breath when walking at my own pace. 3= I stop for breath after walking 100 yards or after a few minutes on level ground. 4=I am too breathless to leave the house or I am breathless when dressing.   . The patient completed the [place] ([employer]).  This questionnaire relates activities of daily living and shortness of breath.  The score ranges from 0-120, a higher score relates to severe shortness of breath during activities of daily living. The patient's score at discharge was 36. ? This is a change of -15 from  entrance.  Quality of Life: . Douglas Carrillo is used to assess the patients satisfaction in different domains of their life; health and functioning, socioeconomic, psychological/spiritual, and family. The overall score is recorded out of 30 points.  The patient's goal is to achieve an overall score of 21 or higher.  Douglas Carrillo received a 27.3 upon discharge.  ? This is a change of +0.78 from entrance.  . The Patient Health Questionnaire (PHQ-2) is a first step approach for the screening of depression.  If the patient scores positive on the PHQ-2 the patient should be further assessed with the PHQ-9.  The Patient Health Questionnaire (PHQ-9) assesses the degree of depression.  Depression is important to monitor and track in pulmonary patients due to its prevalence in the population.  If the patient advances to the PHQ-9 the goal is to score less than 4 on this assessment.  Douglas Carrillo scored a 0 at discharge. ? This is a change of 0 from entrance.   Clinical Assessment Tools: . The COPD Assessment Test (CAT) is a measurement tool to quantify how much of an impact the disease has on the patient's life.  This assessment aids the Pulmonary Rehab Team in designing the patients individualized treatment plan.  A CAT score ranges from 0-40.  A score of 10 or below indicates that COPD has a low impact on the patient's life whereas a score of 30 or higher indicates  a severe impact. The patient's goal is a decrease of 1 point from entrance to discharge.  Douglas Carrillo had a CAT score of 11 upon discharge.   ? This is a change of -2 from entrance.  Nutrition: . The "Rate My Plate" is a dietary assessment that quantifies the balance of a patient's diet.  This tool allows the Pulmonary Rehab Team to key in on the areas of the patient's diet that needs improving.  The team can then focus their nutritional education on those areas.  If the patient scores 24-40, this means there are many  ways they can make their eating habits healthier, 41-57 states that there are some ways they can make their eating habits healthier and a score of 58-72 states that they are making many healthy choices.  The patient's goal is to achieve a score of 49 or higher on this assessment.  Douglas Carrillo scored a 63 upon discharge.   ? This is a change of +5 from entrance.  Oxygen Compliance: . Patient is currently on room air at rest, 2 liters at night, and room air during exercise.  Douglas Carrillo is not currently using cpap/bipap at night.  The patient is currently compliant.  The patient states that they do not have barriers that keep them from using their oxygen.   ? This is a no change result from entrance.    Education: . Douglas Carrillo attended more than 75% of the education classes.  Douglas Carrillo completed a discharge educational assessment and achieved a score of 11/14.  ? This is a change of -2 from entrance.   Smoking Cessation:   The patient is not currently smoking.  Exercise: . Douglas Carrillo was provided with an individualized Home Exercise Prescription (HEP) at the entrance of the program.  The patient's goal is to be exercising 30-60 minutes, 3-5 days per week. Upon discharge the patient is exercising 3 days at home.  This is a change of -1 day from entrance.  After graduation from Pulmonary Rehab, Douglas Carrillo will continue exercising at his community gym facility.

## 2015-05-02 ENCOUNTER — Encounter (HOSPITAL_COMMUNITY): Payer: Medicare Other

## 2015-05-04 ENCOUNTER — Encounter (HOSPITAL_COMMUNITY): Payer: Medicare Other

## 2015-05-04 NOTE — Progress Notes (Signed)
Pulmonary Rehab Discharge Note: Douglas Carrillo has been discharged from pulmonary rehab after successfully completing 21 exercise and education sessions. Douglas Carrillo improved his stamina and strength while in the program as evidenced by increasing footage walked on his discharge 6 min walk test by 101 feet. Douglas Carrillo met his personal goals of being able to do more yard work with less SOB. Douglas Carrillo plans to continue to exercise daily at the gym.

## 2015-07-04 ENCOUNTER — Encounter: Payer: Self-pay | Admitting: Adult Health

## 2015-07-04 ENCOUNTER — Ambulatory Visit (INDEPENDENT_AMBULATORY_CARE_PROVIDER_SITE_OTHER): Payer: Medicare Other | Admitting: Adult Health

## 2015-07-04 VITALS — BP 128/64 | HR 79 | Temp 98.1°F | Ht 72.0 in | Wt 205.0 lb

## 2015-07-04 DIAGNOSIS — G4733 Obstructive sleep apnea (adult) (pediatric): Secondary | ICD-10-CM | POA: Diagnosis not present

## 2015-07-04 DIAGNOSIS — G4734 Idiopathic sleep related nonobstructive alveolar hypoventilation: Secondary | ICD-10-CM

## 2015-07-04 DIAGNOSIS — J449 Chronic obstructive pulmonary disease, unspecified: Secondary | ICD-10-CM

## 2015-07-04 NOTE — Assessment & Plan Note (Signed)
Controlled on current regimen  Plan Continue on Oxygen 2l/m At bedtime   We are setting you up for a ONO for Oxygen test per DME requirements.  Continue on Stiolto 2 puffs daily , brush/rinse after use .  Follow up Dr. Chase Caller in 6 months and As needed

## 2015-07-04 NOTE — Assessment & Plan Note (Addendum)
Continue on Oxygen 2l/m At bedtime   We are setting you up for a ONO for Oxygen test per DME requirements.   .  Follow up Dr. Chase Caller in 6 months and As needed

## 2015-07-04 NOTE — Progress Notes (Signed)
   Subjective:    Patient ID: Douglas Carrillo, male    DOB: 22-Sep-1928, 81 y.o.   MRN: [id number]  HPI 81 year old male with known history of COPD    TEST :  12/2013 PFT >FEV1 was 75%, ratio 68, FVC 79% Diffusing capacity decreased at 41% ONO 12/2013 + desats CT chest 10/2014 no ILD , mod emphysema /air trapping..   07/04/2015 Follow up : COPD  Patient returns for six-month follow-up. Physical spent doing well. here for O2 recert for bedtime. Pt states breathing has improved. He finished pulmonary rehab. Pt states no new compliants. He feels a pulmonary rehabilitation really helped him. DME company is requiring he had an overnight oximetry test to requalify him for his oxygen. Patient denies any flare of cough or wheezing. He denies any chest pain, hemoptysis, orthopnea, PND or leg swelling.   Review of Systems Constitutional:   No  weight loss, night sweats,  Fevers, chills, fatigue, or  lassitude.  HEENT:   No headaches,  Difficulty swallowing,  Tooth/dental problems, or  Sore throat,                No sneezing, itching, ear ache, nasal congestion, post nasal drip,   CV:  No chest pain,  Orthopnea, PND, swelling in lower extremities, anasarca, dizziness, palpitations, syncope.   GI  No heartburn, indigestion, abdominal pain, nausea, vomiting, diarrhea, change in bowel habits, loss of appetite, bloody stools.   Resp:    No chest wall deformity  Skin: no rash or lesions.  GU: no dysuria, change in color of urine, no urgency or frequency.  No flank pain, no hematuria   MS:  No joint pain or swelling.  No decreased range of motion.  No back pain.  Psych:  No change in mood or affect. No depression or anxiety.  No memory loss.         Objective:   Physical Exam GEN: A/Ox3; pleasant , NAD, elderly   HEENT:  [place]/AT,  EACs-clear, TMs-wnl, NOSE-clear, THROAT-clear, no lesions, no postnasal drip or exudate noted.   NECK:  Supple w/ fair ROM; no JVD; normal carotid impulses w/o  bruits; no thyromegaly or nodules palpated; no lymphadenopathy.  RESP  Decreased BS in bases no accessory muscle use, no dullness to percussion  CARD:  RRR, gr 2/6 SM   , 1+ peripheral edema, pulses intact, no cyanosis or clubbing.  GI:   Soft & nt; nml bowel sounds; no organomegaly or masses detected.  Musco: Warm bil, no deformities or joint swelling noted.   Neuro: alert, no focal deficits noted.    Skin: Warm, no lesions or rashes ]       Assessment & Plan:

## 2015-07-04 NOTE — Patient Instructions (Signed)
Continue on Oxygen 2l/m At bedtime   We are setting you up for a ONO for Oxygen test per DME requirements.  Continue on Stiolto 2 puffs daily , brush/rinse after use .  Follow up Dr. Chase Caller in 6 months and As needed

## 2015-07-25 ENCOUNTER — Other Ambulatory Visit: Payer: Self-pay | Admitting: Cardiovascular Disease

## 2015-07-25 MED ORDER — METOPROLOL SUCCINATE ER 100 MG PO TB24: ORAL_TABLET | ORAL | Status: DC

## 2015-07-25 NOTE — Telephone Encounter (Signed)
Rx(s) sent to pharmacy electronically.  

## 2015-07-25 NOTE — Telephone Encounter (Signed)
°  1. Which medications need to be refilled? Metoprolol 2. Which pharmacy is medication to be sent to?[facility]  3. Do they need a 30 day or 90 day supply? 90 days and refills  4. Would they like a call back once the medication has been sent to the pharmacy? no

## 2015-08-07 ENCOUNTER — Telehealth: Payer: Self-pay | Admitting: Cardiovascular Disease

## 2015-08-07 ENCOUNTER — Other Ambulatory Visit: Payer: Self-pay

## 2015-08-07 MED ORDER — AMLODIPINE BESYLATE 5 MG PO TABS: 5.0000 mg | ORAL_TABLET | Freq: Every day | ORAL | Status: DC

## 2015-08-07 NOTE — Telephone Encounter (Signed)
°  1. Which medications need to be refilled? Amlodipine  2. Which pharmacy is medication to be sent to?[facility]   3. Do they need a 30 day or 90 day supply? 90  4. Would they like a call back once the medication has been sent to the pharmacy? Yes   Also ned [facility] [address] until  It comes thru the mail

## 2015-08-07 NOTE — Telephone Encounter (Signed)
Sent 10 day supply of [facility] [address] and [facility] day (3[facility]  Let patient know that both task are completed.

## 2015-08-22 ENCOUNTER — Telehealth: Payer: Self-pay | Admitting: Adult Health

## 2015-08-22 NOTE — Telephone Encounter (Signed)
Per TP  Positive Desats on RA Continue with O2.   Called and spoke with pt. Reviewed results and recs. Pt stated he was already on 2L of O2 at bedtime. Pt voiced understanding and had no further question. Nothing further needed.

## 2015-09-08 ENCOUNTER — Other Ambulatory Visit: Payer: Self-pay | Admitting: Cardiovascular Disease

## 2015-09-08 NOTE — Telephone Encounter (Signed)
REFILL 

## 2015-10-12 ENCOUNTER — Other Ambulatory Visit: Payer: Self-pay | Admitting: Cardiovascular Disease

## 2015-10-24 ENCOUNTER — Encounter: Payer: Self-pay | Admitting: Adult Health

## 2015-11-13 ENCOUNTER — Encounter: Payer: Self-pay | Admitting: Cardiovascular Disease

## 2015-11-13 ENCOUNTER — Ambulatory Visit (INDEPENDENT_AMBULATORY_CARE_PROVIDER_SITE_OTHER): Payer: Medicare Other | Admitting: Cardiovascular Disease

## 2015-11-13 VITALS — BP 130/68 | HR 78 | Ht 72.0 in | Wt 206.2 lb

## 2015-11-13 DIAGNOSIS — I4510 Unspecified right bundle-branch block: Secondary | ICD-10-CM

## 2015-11-13 DIAGNOSIS — Z79899 Other long term (current) drug therapy: Secondary | ICD-10-CM

## 2015-11-13 DIAGNOSIS — E785 Hyperlipidemia, unspecified: Secondary | ICD-10-CM | POA: Diagnosis not present

## 2015-11-13 DIAGNOSIS — E109 Type 1 diabetes mellitus without complications: Secondary | ICD-10-CM | POA: Diagnosis not present

## 2015-11-13 DIAGNOSIS — I25810 Atherosclerosis of coronary artery bypass graft(s) without angina pectoris: Secondary | ICD-10-CM | POA: Diagnosis not present

## 2015-11-13 DIAGNOSIS — I2583 Coronary atherosclerosis due to lipid rich plaque: Secondary | ICD-10-CM

## 2015-11-13 DIAGNOSIS — I2510 Atherosclerotic heart disease of native coronary artery without angina pectoris: Secondary | ICD-10-CM

## 2015-11-13 DIAGNOSIS — I10 Essential (primary) hypertension: Secondary | ICD-10-CM

## 2015-11-13 DIAGNOSIS — G4733 Obstructive sleep apnea (adult) (pediatric): Secondary | ICD-10-CM

## 2015-11-13 DIAGNOSIS — I350 Nonrheumatic aortic (valve) stenosis: Secondary | ICD-10-CM

## 2015-11-13 LAB — COMPREHENSIVE METABOLIC PANEL
ALBUMIN: 4 g/dL (ref 3.6–5.1)
ALK PHOS: 64 U/L (ref 40–115)
ALT: 11 U/L (ref 9–46)
AST: 17 U/L (ref 10–35)
BILIRUBIN TOTAL: 0.5 mg/dL (ref 0.2–1.2)
BUN: 25 mg/dL (ref 7–25)
CALCIUM: 10 mg/dL (ref 8.6–10.3)
CO2: 25 mmol/L (ref 20–31)
Chloride: 103 mmol/L (ref 98–110)
Creat: 1.36 mg/dL — ABNORMAL HIGH (ref 0.70–1.11)
Glucose, Bld: 74 mg/dL (ref 65–99)
POTASSIUM: 3.9 mmol/L (ref 3.5–5.3)
Sodium: 140 mmol/L (ref 135–146)
Total Protein: 7.5 g/dL (ref 6.1–8.1)

## 2015-11-13 LAB — CBC
HEMATOCRIT: 32.1 % — AB (ref 39.0–52.0)
HEMOGLOBIN: 10.1 g/dL — AB (ref 13.0–17.0)
MCH: 28.1 pg (ref 26.0–34.0)
MCHC: 31.5 g/dL (ref 30.0–36.0)
MCV: 89.4 fL (ref 78.0–100.0)
MPV: 8.7 fL (ref 8.6–12.4)
Platelets: 215 10*3/uL (ref 150–400)
RBC: 3.59 MIL/uL — ABNORMAL LOW (ref 4.22–5.81)
RDW: 17.5 % — ABNORMAL HIGH (ref 11.5–15.5)
WBC: 5.8 10*3/uL (ref 4.0–10.5)

## 2015-11-13 LAB — LIPID PANEL
CHOL/HDL RATIO: 3.2 ratio (ref <=5.0)
Cholesterol: 120 mg/dL — ABNORMAL LOW (ref 125–200)
HDL: 37 mg/dL — AB (ref >=40)
LDL Cholesterol: 69 mg/dL (ref <130)
TRIGLYCERIDES: 71 mg/dL (ref <150)
VLDL: 14 mg/dL (ref <30)

## 2015-11-13 LAB — TSH: TSH: 2.62 u[IU]/mL (ref 0.350–4.500)

## 2015-11-13 NOTE — Patient Instructions (Signed)
Your physician has requested that you have an echocardiogram. Echocardiography is a painless test that uses sound waves to create images of your heart. It provides your doctor with information about the size and shape of your heart and how well your heart's chambers and valves are working. This procedure takes approximately one hour. There are no restrictions for this procedure.  Your physician recommends that you return for lab work.  Your physician recommends that you schedule a follow-up appointment in: 3 months with Dr. Claiborne Billings.

## 2015-11-15 ENCOUNTER — Encounter: Payer: Self-pay | Admitting: Cardiovascular Disease

## 2015-11-15 NOTE — Progress Notes (Signed)
Patient ID: Douglas Carrillo, male   DOB: 10-30-1928, 81 y.o.   MRN: [id number]      HPI: Douglas Carrillo is a 81 y.o. male who presents to the office today for a 6 month cardiology evaluation.  Douglas Carrillo has established CAD and in April 2000 underwent CABG surgery by Dr. Cyndia Bent  (LIMA to the LAD, vein to the diagonal,  vein to the intermediate, and vein to the PDA). In January 2007 cardiac catheterization was done due to exertional dyspnea and mild abnormality on nuclear imaging. This revealed patent vein grafts to the diagonal, marginal, and PDA, but he had an atretic LIMA graft to the LAD with evidence for 60% narrowing in the proximal left subclavian. He also developed moderate aortic stenosis. His last nuclear perfusion study in May 2013 showed normal perfusion. An echo Doppler study in April 2013 showed mild LVH with normal systolic function. Moderate calcification of the trileaflet aortic valve with a mean gradient of 23 and maximum gradient of 45 with an estimated aortic valve area of 1.1 cm consistent with moderate valvular aortic stenosis. He did have mild to moderate aortic insufficiency. A one-year followup echo Doppler study in May 2014 again showed normal systolic function. There was grade 1 diastolic dysfunction. He was felt to have moderately to severe aortic stenosis with a mean gradient of 22 peak gradient of 41 and based on an LVOT diameter of 2.0 cm the calculated aortic valve area was approximately 1.0 cm. There is mild AR. He did have heavy calcification of the mitral annulus with mild MR with very mild stenosis with a mean gradient of 4 mm. Right ventricle cavity was mildly dilated.   Douglas Carrillo underwent a follow-up echo Doppler study on 10/18/2014 which revealed normal LV function with an ejection fraction of 55-60%.  There was grade 1 diastolic dysfunction.  His mean transvalvular aortic gradient was now 27 mm and a peak gradient 54 mm.  Aortic valve area was 1.21 cm concordant  with moderate aortic valve stenosis.  There was mild aortic insufficiency.  There was severe mitral annular calcification.  He had mild to moderate left atrial dilatation and mild right atrial dilatation.  Compared to his May 2014 study his mean transvalvular aorticgradient had increased from 22-27 mm.  Douglas Carrillo for his primary care. Additional problems include diabetes mellitus for which he is on metformin, Victoza, Actos, as well as glipizide. He has hyperlipidemia on Crestor and Zetia. Has history of hypertension currently treated with amlodipine, furosemide 40 mg, losartan HCTZ, metoprolol succinate 100 mg, in addition to his isosorbide. He has been documented to have chronic right bundle branch block. He sees Dr. Chase Caller for lung disease and recently underwent pulmonary function studies for further evaluation of shortness of breath.  He has a history of prostate CA.  He tells me he recently had  Lupron therapy with improvement in his PSA, which had risen.  Since I last saw him one-year ago, when a sleep study and was followed by Dr. Gwenette Greet with moderate sleep apnea.  Apparently CPAP was not istituted and he has been on nocturnal oxygen.  He specifically denies chest pain. He denies presyncope or syncope. He denies CHF symptoms , but experiences mild shortness of breath with activity, but he believes this has not changed at all for the past year.  He presents for evaluation.   Past Medical History  Diagnosis Date  . Hypertension   . Diabetes mellitus ([place])   . High  cholesterol   . Prostate cancer ([place])   . CAD (coronary artery disease)   . OSA (obstructive sleep apnea)     Past Surgical History  Procedure Laterality Date  . Coronary artery bypass graft  03/29/1999    x 4:  LIMA to the LAD,SVG to the diagonal branch of the LAD,SVG to the intermediate coronary artery & SVG to the posterior descending branch of the RCA.  . Colon surgery      x 4  . Nm myocar perf wall  motion  04/28/2012    Low Risk Scan  . Prostate surgery      Allergies  Allergen Reactions  . Mold Extract [Trichophyton] Anaphylaxis  . Lipitor [Atorvastatin]     Current Outpatient Prescriptions  Medication Sig Dispense Refill  . amLODipine (NORVASC) 5 MG tablet Take 1 tablet (5 mg total) by mouth daily. 90 tablet 3  . aspirin 81 MG tablet Take 81 mg by mouth daily.    Marland Kitchen b complex vitamins capsule Take 1 capsule by mouth daily.    . B-D ULTRAFINE III SHORT PEN 31G X 8 MM MISC     . benzonatate (TESSALON) 100 MG capsule Take 1 capsule by mouth 3 (three) times daily.  1  . clopidogrel (PLAVIX) 75 MG tablet TAKE 1 TABLET DAILY 90 tablet 3  . CRESTOR 20 MG tablet Take 1 tablet by mouth daily.    Marland Kitchen EPIPEN 2-PAK 0.3 MG/0.3ML SOAJ injection     . furosemide (LASIX) 40 MG tablet TAKE 1 TABLET DAILY 90 tablet 3  . GLIPIZIDE XL 10 MG 24 hr tablet Take 1 tablet by mouth daily.    . isosorbide mononitrate (IMDUR) 60 MG 24 hr tablet Take 1.5 tablets (90 mg total) by mouth daily. 135 tablet 0  . ketotifen (THERA TEARS ALLERGY) 0.025 % ophthalmic solution 1 drop 2 (two) times daily.    Marland Kitchen KLOR-CON M20 20 MEQ tablet TAKE 3 TABLETS DAILY 270 tablet 2  . KLOR-CON M20 20 MEQ tablet TAKE 3 TABLETS DAILY 270 tablet 0  . latanoprost (XALATAN) 0.005 % ophthalmic solution Place 1 drop into both eyes at bedtime.    Marland Kitchen losartan-hydrochlorothiazide (HYZAAR) 100-25 MG per tablet TAKE 1 TABLET DAILY 90 tablet 3  . meclizine (ANTIVERT) 25 MG tablet Take 25 mg by mouth as needed for dizziness.    . metFORMIN (GLUCOPHAGE-XR) 500 MG 24 hr tablet Take 1 tablet by mouth 2 (two) times daily.    . metoprolol succinate (TOPROL-XL) 100 MG 24 hr tablet TAKE 1 TABLET (100 MG)     EVERY MORNING AND TAKE 1/2 TABLET (50 MG) AT BEDTIME 135 tablet 0  . Multiple Vitamins-Minerals (CENTRUM SILVER ULTRA MENS PO) Take 1 tablet by mouth daily.    Marland Kitchen NEXIUM 40 MG capsule Take 1 capsule by mouth daily.    Glory Rosebush DELICA LANCETS 57S  MISC 1 each by Other route 2 (two) times daily. Use 1 lancet each to check glucose bid    . ONETOUCH VERIO test strip 1 strip by Other route 2 (two) times daily. Use 1 strip to check glucose bid    . oxybutynin (DITROPAN-XL) 5 MG 24 hr tablet Take 1 tablet by mouth daily.    . pioglitazone (ACTOS) 45 MG tablet Take 1 tablet by mouth daily.    Marland Kitchen PROAIR HFA 108 (90 BASE) MCG/ACT inhaler Inhale into the lungs as needed.    . RESTASIS 0.05 % ophthalmic emulsion Place 1 drop into both eyes 2 (  two) times daily.    Marland Kitchen Spacer/Aero-Holding Chambers (AEROCHAMBER PLUS FLO-VU) MISC   1  . Tiotropium Bromide-Olodaterol (STIOLTO RESPIMAT) 2.5-2.5 MCG/ACT AERS Inhale 2 puffs into the lungs daily. 3 Inhaler 3  . VICTOZA 18 MG/3ML SOPN Inject 1.8 mg as directed daily.    . vitamin C (ASCORBIC ACID) 500 MG tablet Take 500 mg by mouth 2 (two) times daily.    . Vitamin D, Ergocalciferol, (DRISDOL) 50000 UNITS CAPS capsule Take 1 capsule by mouth. Every other week    . vitamin E 400 UNIT capsule Take 400 Units by mouth daily.    Marland Kitchen ZETIA 10 MG tablet Take 1 tablet by mouth daily.     No current facility-administered medications for this visit.    Social History   Social History  . Marital Status: Widowed    Spouse Name: N/A  . Number of Children: N/A  . Years of Education: N/A   Occupational History  . retired    Social History Main Topics  . Smoking status: Former Smoker -- 1.00 packs/day for 30 years    Types: Cigarettes    Quit date: 12/24/1983  . Smokeless tobacco: Never Used     Comment: quit smoking in 1985  . Alcohol Use: 0.0 oz/week    0 Standard drinks or equivalent per week     Comment: rarely  . Drug Use: No  . Sexual Activity: Not on file   Other Topics Concern  . Not on file   Social History Narrative   Social history is notable that he is widowed. His daughter lives block away. He has one child and 2 grandchildren. He goes to the gym 4 days a week to exercise.  Family History    Problem Relation Age of Onset  . Kidney disease Brother   . Breast cancer Sister   . Rheum arthritis Mother     ROS General: Negative; No fevers, chills, or night sweats;  HEENT: Negative; No changes in vision or hearing, sinus congestion, difficulty swallowing Pulmonary: positive for mild; No cough, wheezing, , hemoptysis Cardiovascular: See history of present illness No chest pain, presyncope, syncope, palpatations At times, very mild lower extremity edema GI: Negative; No nausea, vomiting, diarrhea, or abdominal pain GU: positive for history of prostate CA; No dysuria, hematuria, or difficulty voiding Musculoskeletal: Negative; no myalgias, joint pain, or weakness Hematologic/Oncology: Negative; no easy bruising, bleeding Endocrine: Positive for type; no heat/cold intolerance Neuro: Negative; no changes in balance, headaches Skin: Negative; No rashes or skin lesions Psychiatric: Negative; No behavioral problems, depression Sleep: Negative; No snoring, daytime sleepiness, hypersomnolence, bruxism, restless legs, hypnogognic hallucinations, no cataplexy Other comprehensive 14 point system review is negative  PE BP 130/68 mmHg  Pulse 78  Ht 6' (1.829 m)  Wt 206 lb 4 oz (93.554 kg)  BMI 27.97 kg/m2   Wt Readings from Last 3 Encounters:  11/13/15 206 lb 4 oz (93.554 kg)  07/04/15 205 lb (92.987 kg)  04/05/15 211 lb 3.2 oz (95.8 kg)   General: Alert, oriented, no distress.  Skin: normal turgor, no rashes HEENT: Normocephalic, atraumatic. Pupils round and reactive; sclera anicteric;no lid lag.  Nose without nasal septal hypertrophy Mouth/Parynx benign; Mallinpatti scale 2 Neck: No JVD, transmitted murmur to his carotids Lungs: clear to ausculatation and percussion; no wheezing or rales Chest wall: Nontender to palpation Heart: RRR, s1 s2 normal 2/6 aortic stenosis murmur with 1/6 aortic insufficiency; no S3 gallop, no rubs, thrills or heaves Abdomen: soft, nontender; no  hepatosplenomehaly,  BS+; abdominal aorta nontender and not dilated by palpation. Pulses 2+ Extremities: Trivial ankle edema , right greater than left; , no clubbing cyanosis, Homan's sign negative  Neurologic: grossly nonfocal Psychologic: normal affect and mood.  ECG (independently read by me): Normal sinus rhythm with sinus arrhythmia PVC.  Right bundle-branch block and left anterior hemiblock.  November 2015 ECG (independently read by me): normal sinus rhythm.  Right bundle branch block.  Isolated  premature ventricular contraction.  June 2015 ECG (independently read by me): Normal sinus rhythm at 71 beats per minute. RBBB with repolarization changes.  QTc interval 465 ms.  Prior December 2014 ECG: Sinus rhythm at 77 beats per minute with right bundle branch block and associated repolarization changes. Borderline first-degree AV block with a PR interval of 206 ms. QTC of 454 ms.  LABS:  BMP Latest Ref Rng 11/13/2015 07/20/2009  Glucose 65 - 99 mg/dL 74 150(H)  BUN 7 - 25 mg/dL 25 20  Creatinine 0.70 - 1.11 mg/dL 1.36(H) 1.1  Sodium 135 - 146 mmol/L 140 139  Potassium 3.5 - 5.3 mmol/L 3.9 4.3  Chloride 98 - 110 mmol/L 103 104  CO2 20 - 31 mmol/L 25 -  Calcium 8.6 - 10.3 mg/dL 10.0 -   Hepatic Function Latest Ref Rng 11/13/2015  Total Protein 6.1 - 8.1 g/dL 7.5  Albumin 3.6 - 5.1 g/dL 4.0  AST 10 - 35 U/L 17  ALT 9 - 46 U/L 11  Alk Phosphatase 40 - 115 U/L 64  Total Bilirubin 0.2 - 1.2 mg/dL 0.5   CBC Latest Ref Rng 11/13/2015 07/21/2009 07/20/2009  WBC 4.0 - 10.5 K/uL 5.8 - -  Hemoglobin 13.0 - 17.0 g/dL 10.1(L) 10.4(L) 11.9(L)  Hematocrit 39.0 - 52.0 % 32.1(L) - 35.0(L)  Platelets 150 - 400 K/uL 215 - -   Lab Results  Component Value Date   MCV 89.4 11/13/2015   Lab Results  Component Value Date   TSH 2.620 11/13/2015   No results found for: HGBA1C  Lipid Panel     Component Value Date/Time   CHOL 120* 11/13/2015 1020   TRIG 71 11/13/2015 1020   HDL 37*  11/13/2015 1020   CHOLHDL 3.2 11/13/2015 1020   VLDL 14 11/13/2015 1020   LDLCALC 69 11/13/2015 1020   RADIOLOGY: No results found.    ASSESSMENT AND PLAN: Mr. Mcmains is an 81 years old gentleman who is 16 years following his CABG revascularization surgery. He has been documented to have an atretic LIMA graft but otherwise patent vein grafts. His last echo Doppler continues to suggest moderate aortic valve stenosis with an estimated valve area 1.2 cm.  There was slight increase in his mean gradient from 22-27 and in his peak gradient from 41-54 mm meters from his last evaluation. Does note shortness of breath with activity.  He denies recurrent chest pain or anginal type symptoms.  He has mild shortness of breath with exertion but continues to be active.  I discussed with him the potential multi-factorial etiology to his dyspnea including CAD, diastolic dysfunction, aortic valve stenosis, as well as his pulmonary disease.  He now is on nocturnal oxygen and was found to have moderate sleep apnea on his sleep study.  His blood pressure today is stable.  There is trivial right ankle edema.  He is continuing on amlodipine 5 mg, isosorbide 90 mg, or tinnitus HCT 100/25 mg, Toprol-XL 100 mg in the morning and 50 mg at night for CAD and hypertension He is diabetic  on Actos, metforminand glipizide.  He takes furosemide for his ankle swelling.  He is now followed by Dr. Chase Caller for his lung disease.  He is fasting today and laboratory will be checked.  I am also scheduling him for a follow-up echo Doppler study to reevaluate his aortic stenosis.  I will see him in 3 months for follow-up evaluation and further recommendations will be made at that time.  Time spent: 30 minutes Troy Sine, MD, [facility]  11/15/2015 12:10 PM

## 2015-11-24 ENCOUNTER — Ambulatory Visit (HOSPITAL_COMMUNITY): Payer: Medicare Other | Attending: Cardiovascular Disease

## 2015-11-24 ENCOUNTER — Other Ambulatory Visit: Payer: Self-pay

## 2015-11-24 DIAGNOSIS — I059 Rheumatic mitral valve disease, unspecified: Secondary | ICD-10-CM | POA: Insufficient documentation

## 2015-11-24 DIAGNOSIS — I517 Cardiomegaly: Secondary | ICD-10-CM | POA: Insufficient documentation

## 2015-11-24 DIAGNOSIS — I352 Nonrheumatic aortic (valve) stenosis with insufficiency: Secondary | ICD-10-CM | POA: Insufficient documentation

## 2015-11-24 DIAGNOSIS — E785 Hyperlipidemia, unspecified: Secondary | ICD-10-CM | POA: Insufficient documentation

## 2015-11-24 DIAGNOSIS — I359 Nonrheumatic aortic valve disorder, unspecified: Secondary | ICD-10-CM | POA: Diagnosis present

## 2015-11-24 DIAGNOSIS — I4510 Unspecified right bundle-branch block: Secondary | ICD-10-CM | POA: Diagnosis not present

## 2015-11-24 DIAGNOSIS — E119 Type 2 diabetes mellitus without complications: Secondary | ICD-10-CM | POA: Diagnosis not present

## 2015-11-24 DIAGNOSIS — Z87891 Personal history of nicotine dependence: Secondary | ICD-10-CM | POA: Diagnosis not present

## 2015-11-24 DIAGNOSIS — I350 Nonrheumatic aortic (valve) stenosis: Secondary | ICD-10-CM | POA: Insufficient documentation

## 2015-11-24 DIAGNOSIS — G4733 Obstructive sleep apnea (adult) (pediatric): Secondary | ICD-10-CM | POA: Diagnosis not present

## 2015-11-24 DIAGNOSIS — I10 Essential (primary) hypertension: Secondary | ICD-10-CM | POA: Insufficient documentation

## 2015-11-27 ENCOUNTER — Other Ambulatory Visit: Payer: Self-pay | Admitting: Cardiovascular Disease

## 2015-11-27 NOTE — Telephone Encounter (Signed)
Rx request sent to pharmacy.  

## 2015-12-05 ENCOUNTER — Encounter: Payer: Self-pay | Admitting: *Deleted

## 2015-12-07 ENCOUNTER — Telehealth: Payer: Self-pay | Admitting: *Deleted

## 2015-12-07 NOTE — Telephone Encounter (Signed)
-----   Message from Troy Sine, MD sent at 12/04/2015  5:55 PM EST ----- AS is now severe; arrange for f/u ov sooner than scheduled

## 2015-12-07 NOTE — Telephone Encounter (Signed)
Patient notified of echo results and recommendations. Appointment scheduled on 12/10/16 @ 9:00.

## 2015-12-11 ENCOUNTER — Ambulatory Visit (INDEPENDENT_AMBULATORY_CARE_PROVIDER_SITE_OTHER): Payer: Medicare Other | Admitting: Cardiovascular Disease

## 2015-12-11 ENCOUNTER — Other Ambulatory Visit: Payer: Self-pay | Admitting: *Deleted

## 2015-12-11 ENCOUNTER — Ambulatory Visit
Admission: RE | Admit: 2015-12-11 | Discharge: 2015-12-11 | Disposition: A | Discharge status: Home / Self Care | Payer: Medicare Other | Source: Ambulatory Visit | Attending: Cardiovascular Disease | Admitting: Cardiovascular Disease

## 2015-12-11 ENCOUNTER — Encounter: Payer: Self-pay | Admitting: Cardiovascular Disease

## 2015-12-11 VITALS — BP 130/80 | HR 71 | Ht 72.0 in | Wt 208.0 lb

## 2015-12-11 DIAGNOSIS — Z01818 Encounter for other preprocedural examination: Secondary | ICD-10-CM | POA: Diagnosis not present

## 2015-12-11 DIAGNOSIS — I2583 Coronary atherosclerosis due to lipid rich plaque: Secondary | ICD-10-CM

## 2015-12-11 DIAGNOSIS — R931 Abnormal findings on diagnostic imaging of heart and coronary circulation: Secondary | ICD-10-CM

## 2015-12-11 DIAGNOSIS — G4733 Obstructive sleep apnea (adult) (pediatric): Secondary | ICD-10-CM

## 2015-12-11 DIAGNOSIS — I350 Nonrheumatic aortic (valve) stenosis: Secondary | ICD-10-CM | POA: Diagnosis not present

## 2015-12-11 DIAGNOSIS — I10 Essential (primary) hypertension: Secondary | ICD-10-CM

## 2015-12-11 DIAGNOSIS — D689 Coagulation defect, unspecified: Secondary | ICD-10-CM | POA: Diagnosis not present

## 2015-12-11 DIAGNOSIS — I25810 Atherosclerosis of coronary artery bypass graft(s) without angina pectoris: Secondary | ICD-10-CM

## 2015-12-11 DIAGNOSIS — I2510 Atherosclerotic heart disease of native coronary artery without angina pectoris: Secondary | ICD-10-CM

## 2015-12-11 LAB — CBC
HEMATOCRIT: 32.5 % — AB (ref 39.0–52.0)
HEMOGLOBIN: 10.1 g/dL — AB (ref 13.0–17.0)
MCH: 28.2 pg (ref 26.0–34.0)
MCHC: 31.1 g/dL (ref 30.0–36.0)
MCV: 90.8 fL (ref 78.0–100.0)
MPV: 8.7 fL (ref 8.6–12.4)
Platelets: 236 10*3/uL (ref 150–400)
RBC: 3.58 MIL/uL — AB (ref 4.22–5.81)
RDW: 17.9 % — ABNORMAL HIGH (ref 11.5–15.5)
WBC: 5.4 10*3/uL (ref 4.0–10.5)

## 2015-12-11 LAB — APTT: APTT: 29 s (ref 24–37)

## 2015-12-11 LAB — BASIC METABOLIC PANEL
BUN: 17 mg/dL (ref 7–25)
CHLORIDE: 105 mmol/L (ref 98–110)
CO2: 25 mmol/L (ref 20–31)
CREATININE: 1.36 mg/dL — AB (ref 0.70–1.11)
Calcium: 9 mg/dL (ref 8.6–10.3)
Glucose, Bld: 80 mg/dL (ref 65–99)
POTASSIUM: 4.1 mmol/L (ref 3.5–5.3)
Sodium: 140 mmol/L (ref 135–146)

## 2015-12-11 LAB — PROTIME-INR
INR: 1.09 (ref <1.50)
Prothrombin Time: 14.2 seconds (ref 11.6–15.2)

## 2015-12-11 NOTE — Progress Notes (Signed)
Patient ID: Douglas Carrillo, male   DOB: 28-Jan-1928, 81 y.o.   MRN: [id number]      HPI: Douglas Carrillo is a 81 y.o. male who presents to the office today for a one month cardiology evaluation.  Douglas Carrillo has established CAD and in April 2000 underwent CABG surgery by Dr. Cyndia Bent  (LIMA to the LAD, vein to the diagonal,  vein to the intermediate, and vein to the PDA). In January 2007 cardiac catheterization was done due to exertional dyspnea and mild abnormality on nuclear imaging. This revealed patent vein grafts to the diagonal, marginal, and PDA, but Douglas Carrillo had an atretic LIMA graft to the LAD with evidence for 60% narrowing in the proximal left subclavian. Douglas Carrillo also developed moderate aortic stenosis. A  nuclear perfusion study in May 2013 showed normal perfusion. An echo Doppler study in April 2013 showed mild LVH with normal systolic function. Moderate calcification of the trileaflet aortic valve with a mean gradient of 23 and maximum gradient of 45 with an estimated aortic valve area of 1.1 cm consistent with moderate valvular aortic stenosis. Douglas Carrillo did have mild to moderate aortic insufficiency. A one-year followup echo Doppler study in May 2014 again showed normal systolic function. There was grade 1 diastolic dysfunction. Douglas Carrillo was felt to have moderately to severe aortic stenosis with a mean gradient of 22 peak gradient of 41 and based on an LVOT diameter of 2.0 cm the calculated aortic valve area was approximately 1.0 cm. There is mild AR. Douglas Carrillo did have heavy calcification of the mitral annulus with mild MR with very mild stenosis with a mean gradient of 4 mm. Right ventricle cavity was mildly dilated.   Douglas Carrillo underwent a follow-up echo Doppler study on 10/18/2014 which revealed normal LV function with an ejection fraction of 55-60%.  There was grade 1 diastolic dysfunction.  Douglas Carrillo mean transvalvular aortic gradient was now 27 mm and a peak gradient 54 mm.  Aortic valve area was 1.21 cm concordant with  moderate aortic valve stenosis.  There was mild aortic insufficiency.  There was severe mitral annular calcification.  Douglas Carrillo had mild to moderate left atrial dilatation and mild right atrial dilatation.  Compared to Douglas Carrillo May 2014 study Douglas Carrillo mean transvalvular aorticgradient had increased from 22-27 mm.  Douglas Carrillo sees Dr. Eden Carrillo for Douglas Carrillo primary care. Additional problems include diabetes mellitus for which Douglas Carrillo is on metformin, Victoza, Actos, as well as glipizide. Douglas Carrillo has hyperlipidemia on Crestor and Zetia. Has history of hypertension currently treated with amlodipine, furosemide 40 mg, losartan HCTZ, metoprolol succinate 100 mg, in addition to Douglas Carrillo isosorbide. Douglas Carrillo has been documented to have chronic right bundle branch block. Douglas Carrillo sees Dr. Chase Carrillo for lung disease and recently underwent pulmonary function studies for further evaluation of shortness of breath.  Douglas Carrillo has a history of prostate CA.  Douglas Carrillo tells me Douglas Carrillo recently had  Lupron therapy with improvement in Douglas Carrillo PSA, which had risen.  Since I last saw him one-year ago, when a sleep study and was followed by Dr. Gwenette Carrillo with moderate sleep apnea.  Apparently CPAP was not istituted and Douglas Carrillo has been on nocturnal oxygen.  Douglas Carrillo specifically denies chest pain. Douglas Carrillo denies presyncope or syncope. Douglas Carrillo denies CHF symptoms , but experiences mild shortness of breath with activity, but Douglas Carrillo believes this has not changed at all for the past year.    When I saw him one month ago, I recommended a follow-up echo Doppler study to reassess Douglas Carrillo aortic stenosis. This was done on  11/24/2015.  Douglas Carrillo continue to have normal systolic function with an ejection fraction of 50-55%, but there was grade 2 diastolic dysfunction. The echo demonstrates significant progression of Douglas Carrillo aortic stenosis which is now severe.  Douglas Carrillo had severely restricted aortic valve mobility with a peak gradient now at 73 and a mean gradient of 42 mmHg.  Aortic valve area was 0.67 cm.  Annular calcification and possible mild  mitral stenosis.  There was moderate left atrial dilatation and mild right atrial dilatation.  RV systolic function was mildly reduced.  Douglas Carrillo denies any episodes of chest pain, but Douglas Carrillo admits to exertional shortness of breath at times with only very minimal activity.   Douglas Carrillo denies presyncope or syncope, but does admit to it occasional episodes of vertigo. Douglas Carrillo presents for reevaluation.   Past Medical History  Diagnosis Date  . Hypertension   . Diabetes mellitus ([place])   . High cholesterol   . Prostate cancer ([place])   . CAD (coronary artery disease)   . OSA (obstructive sleep apnea)     Past Surgical History  Procedure Laterality Date  . Coronary artery bypass graft  03/29/1999    x 4:  LIMA to the LAD,SVG to the diagonal branch of the LAD,SVG to the intermediate coronary artery & SVG to the posterior descending branch of the RCA.  . Colon surgery      x 4  . Nm myocar perf wall motion  04/28/2012    Low Risk Scan  . Prostate surgery      Allergies  Allergen Reactions  . Mold Extract [Trichophyton] Anaphylaxis  . Lipitor [Atorvastatin] Other (See Comments)    Weakness in legs/myalgias    Current Outpatient Prescriptions  Medication Sig Dispense Refill  . amLODipine (NORVASC) 5 MG tablet Take 1 tablet (5 mg total) by mouth daily. 90 tablet 3  . aspirin 81 MG tablet Take 81 mg by mouth daily.    Marland Kitchen b complex vitamins capsule Take 1 capsule by mouth daily.    . B-D ULTRAFINE III SHORT PEN 31G X 8 MM MISC     . benzonatate (TESSALON) 100 MG capsule Take 1 capsule by mouth 3 (three) times daily.  1  . clopidogrel (PLAVIX) 75 MG tablet TAKE 1 TABLET DAILY 90 tablet 3  . CRESTOR 20 MG tablet Take 1 tablet by mouth daily.    Marland Kitchen EPIPEN 2-PAK 0.3 MG/0.3ML SOAJ injection     . furosemide (LASIX) 40 MG tablet TAKE 1 TABLET DAILY 90 tablet 0  . GLIPIZIDE XL 10 MG 24 hr tablet Take 1 tablet by mouth daily.    . isosorbide mononitrate (IMDUR) 60 MG 24 hr tablet TAKE 1 AND 1/2 TABLETS     DAILY 135  tablet 0  . ketotifen (THERA TEARS ALLERGY) 0.025 % ophthalmic solution 1 drop 2 (two) times daily.    Marland Kitchen KLOR-CON M20 20 MEQ tablet TAKE 3 TABLETS DAILY 270 tablet 0  . latanoprost (XALATAN) 0.005 % ophthalmic solution Place 1 drop into both eyes at bedtime.    Marland Kitchen losartan-hydrochlorothiazide (HYZAAR) 100-25 MG per tablet TAKE 1 TABLET DAILY 90 tablet 3  . meclizine (ANTIVERT) 25 MG tablet Take 25 mg by mouth as needed for dizziness.    . metFORMIN (GLUCOPHAGE-XR) 500 MG 24 hr tablet Take 1 tablet by mouth 2 (two) times daily.    . metoprolol succinate (TOPROL-XL) 100 MG 24 hr tablet TAKE 1 TABLET (100 MG)     EVERY MORNING AND TAKE 1/2 TABLET (50  MG) AT BEDTIME 135 tablet 0  . Multiple Vitamins-Minerals (CENTRUM SILVER ULTRA MENS PO) Take 1 tablet by mouth daily.    Marland Kitchen NEXIUM 40 MG capsule Take 1 capsule by mouth daily.    Glory Rosebush DELICA LANCETS 81X MISC 1 each by Other route 2 (two) times daily. Use 1 lancet each to check glucose bid    . ONETOUCH VERIO test strip 1 strip by Other route 2 (two) times daily. Use 1 strip to check glucose bid    . oxybutynin (DITROPAN-XL) 5 MG 24 hr tablet Take 1 tablet by mouth daily.    . pioglitazone (ACTOS) 45 MG tablet Take 1 tablet by mouth daily.    Marland Kitchen PROAIR HFA 108 (90 BASE) MCG/ACT inhaler Inhale into the lungs as needed.    . RESTASIS 0.05 % ophthalmic emulsion Place 1 drop into both eyes 2 (two) times daily.    Marland Kitchen Spacer/Aero-Holding Chambers (AEROCHAMBER PLUS FLO-VU) MISC   1  . Tiotropium Bromide-Olodaterol (STIOLTO RESPIMAT) 2.5-2.5 MCG/ACT AERS Inhale 2 puffs into the lungs daily. 3 Inhaler 3  . VICTOZA 18 MG/3ML SOPN Inject 1.8 mg as directed daily.    . vitamin C (ASCORBIC ACID) 500 MG tablet Take 500 mg by mouth 2 (two) times daily.    . Vitamin D, Ergocalciferol, (DRISDOL) 50000 UNITS CAPS capsule Take 1 capsule by mouth. Every other week    . vitamin E 400 UNIT capsule Take 400 Units by mouth daily.    Marland Kitchen ZETIA 10 MG tablet Take 1 tablet by  mouth daily.     No current facility-administered medications for this visit.    Social History   Social History  . Marital Status: Widowed    Spouse Name: N/A  . Number of Children: N/A  . Years of Education: N/A   Occupational History  . retired    Social History Main Topics  . Smoking status: Former Smoker -- 1.00 packs/day for 30 years    Types: Cigarettes    Quit date: 12/24/1983  . Smokeless tobacco: Never Used     Comment: quit smoking in 1985  . Alcohol Use: 0.0 oz/week    0 Standard drinks or equivalent per week     Comment: rarely  . Drug Use: No  . Sexual Activity: Not on file   Other Topics Concern  . Not on file   Social History Narrative   Social history is notable that Douglas Carrillo is widowed. Douglas Carrillo daughter lives block away. Douglas Carrillo has one child and 2 grandchildren. Douglas Carrillo goes to the gym 4 days a week to exercise.  Family History  Problem Relation Age of Onset  . Kidney disease Brother   . Breast cancer Sister   . Rheum arthritis Mother     ROS General: Negative; No fevers, chills, or night sweats;  HEENT: Negative; No changes in vision or hearing, sinus congestion, difficulty swallowing Pulmonary: positive for mild; No cough, wheezing, , hemoptysis Cardiovascular: See history of present illness  At times, very mild lower extremity edema GI: Negative; No nausea, vomiting, diarrhea, or abdominal pain GU: positive for history of prostate CA; No dysuria, hematuria, or difficulty voiding Musculoskeletal: Negative; no myalgias, joint pain, or weakness Hematologic/Oncology: Negative; no easy bruising, bleeding Endocrine: Positive for type; no heat/cold intolerance Neuro: Negative; no changes in balance, headaches Skin: Negative; No rashes or skin lesions Psychiatric: Negative; No behavioral problems, depression Sleep: Negative; No snoring, daytime sleepiness, hypersomnolence, bruxism, restless legs, hypnogognic hallucinations, no cataplexy Other comprehensive 14 point  system review is negative  PE BP 130/80 mmHg  Pulse 71  Ht 6' (1.829 m)  Wt 208 lb (94.348 kg)  BMI 28.20 kg/m2   Wt Readings from Last 3 Encounters:  12/11/15 208 lb (94.348 kg)  11/13/15 206 lb 4 oz (93.554 kg)  07/04/15 205 lb (92.987 kg)   General: Alert, oriented, no distress.  Skin: normal turgor, no rashes HEENT: Normocephalic, atraumatic. Pupils round and reactive; sclera anicteric;no lid lag.  Nose without nasal septal hypertrophy Mouth/Parynx benign; Mallinpatti scale 2 Neck: No JVD, transmitted murmur to Douglas Carrillo carotids Lungs: clear to ausculatation and percussion; no wheezing or rales Chest wall: Nontender to palpation Heart: RRR, s1 s2 normal 2/6 early to mid peakingaortic stenosis murmur with 1/6 aortic insufficiency; no S3 gallop, no rubs, thrills or heaves Abdomen: soft, nontender; no hepatosplenomehaly, BS+; abdominal aorta nontender and not dilated by palpation. Pulses 2+ Extremities: Trivial ankle edema , right greater than left; , no clubbing cyanosis, Homan's sign negative  Neurologic: grossly nonfocal Psychologic: normal affect and mood.  ECG (independently read by me):  Sinus rhythm at 71 bpm with sinus arrhythmia and occasional PVCs. Right bundle branch block.  Left anterior hemiblock. LVH.  11/21/2016ECG (independently read by me): Normal sinus rhythm with sinus arrhythmia PVC.  Right bundle-branch block and left anterior hemiblock.  November 2015 ECG (independently read by me): normal sinus rhythm.  Right bundle branch block.  Isolated  premature ventricular contraction.  June 2015 ECG (independently read by me): Normal sinus rhythm at 71 beats per minute. RBBB with repolarization changes.  QTc interval 465 ms.  Prior December 2014 ECG: Sinus rhythm at 77 beats per minute with right bundle branch block and associated repolarization changes. Borderline first-degree AV block with a PR interval of 206 ms. QTC of 454 ms.  LABS:  BMP Latest Ref Rng  11/13/2015 07/20/2009  Glucose 65 - 99 mg/dL 74 150(H)  BUN 7 - 25 mg/dL 25 20  Creatinine 0.70 - 1.11 mg/dL 1.36(H) 1.1  Sodium 135 - 146 mmol/L 140 139  Potassium 3.5 - 5.3 mmol/L 3.9 4.3  Chloride 98 - 110 mmol/L 103 104  CO2 20 - 31 mmol/L 25 -  Calcium 8.6 - 10.3 mg/dL 10.0 -   Hepatic Function Latest Ref Rng 11/13/2015  Total Protein 6.1 - 8.1 g/dL 7.5  Albumin 3.6 - 5.1 g/dL 4.0  AST 10 - 35 U/L 17  ALT 9 - 46 U/L 11  Alk Phosphatase 40 - 115 U/L 64  Total Bilirubin 0.2 - 1.2 mg/dL 0.5   CBC Latest Ref Rng 11/13/2015 07/21/2009 07/20/2009  WBC 4.0 - 10.5 K/uL 5.8 - -  Hemoglobin 13.0 - 17.0 g/dL 10.1(L) 10.4(L) 11.9(L)  Hematocrit 39.0 - 52.0 % 32.1(L) - 35.0(L)  Platelets 150 - 400 K/uL 215 - -   Lab Results  Component Value Date   MCV 89.4 11/13/2015   Lab Results  Component Value Date   TSH 2.620 11/13/2015   No results found for: HGBA1C  Lipid Panel     Component Value Date/Time   CHOL 120* 11/13/2015 1020   TRIG 71 11/13/2015 1020   HDL 37* 11/13/2015 1020   CHOLHDL 3.2 11/13/2015 1020   VLDL 14 11/13/2015 1020   LDLCALC 69 11/13/2015 1020   RADIOLOGY: No results found.    ASSESSMENT AND PLAN: Douglas Carrillo is an 81 years old gentleman who is 16 years following Douglas Carrillo CABG revascularization surgery. Douglas Carrillo has been documented to have an atretic LIMA graft but  otherwise patent vein grafts. Douglas Carrillo previous echo Doppler  suggested moderate aortic valve stenosis with an estimated valve area 1.2 cm.  There was slight increase in Douglas Carrillo mean gradient from 22-27 and in Douglas Carrillo peak gradient from 41-54 mm meters.  When I last saw him, Douglas Carrillo was not having chest pain or anginal type symptoms but did note exertional dyspnea.  I spent a long time with both Douglas Carrillo and Douglas Carrillo daughter today.  Reviewed Douglas Carrillo most recent echo Doppler study.  This now shows progression of Douglas Carrillo aortic stenosis.  2.  Severe.  Douglas Carrillo mean gradient is now 42 with a peak gradient of 73 mm an estimated aortic valve area of 0.67  cm.  It is my recommendation that definitive right left heart cardiac catheterization be performed.  This will be important to reassess graft patency.  I suspect Douglas Carrillo will need aortic valve replacement surgery, and depending upon Douglas Carrillo coronary anatomy this will affect the decision of whether or not Douglas Carrillo is a candidate for TAVR versus open AVR with redo CABG.  I suspect Douglas Carrillo dyspnea is related to Douglas Carrillo progressive AS.  When given options for timing of this catheterization.  Douglas Carrillo would prefer that this be done as soon as possible.  I will try to schedule this for tomorrow at [facility]. Laboratory will be obtained today. The risks and benefits of a cardiac catheterization including, but not limited to, death, stroke, MI, kidney damage and bleeding were discussed with the patient who indicates understanding and agrees to proceed.     Time spent: 30 minutes Troy Sine, MD, [facility]  12/11/2015 12:29 PM

## 2015-12-11 NOTE — Patient Instructions (Addendum)
Your physician has requested that you have a cardiac catheterization. Cardiac catheterization is used to diagnose and/or treat various heart conditions. Doctors may recommend this procedure for a number of different reasons. The most common reason is to evaluate chest pain. Chest pain can be a symptom of coronary artery disease (CAD), and cardiac catheterization can show whether plaque is narrowing or blocking your heart's arteries. This procedure is also used to evaluate the valves, as well as measure the blood flow and oxygen levels in different parts of your heart. For further information please visit [email or web address]. This will be done tomorrow by [facility]  Following your catheterization, you will not be allowed to drive for 3 days.  No lifting, pushing, or pulling greater that 10 pounds is allowed for 1 week.  You will be required to have the following tests prior to the procedure:  1. Blood work- today  It can be done at any [employer].  There is one downstairs on the first floor of this building and one in the [facility] building [phone]).  2. Chest Xray-the chest xray order has already been placed at the [facility].      Puncture site

## 2015-12-12 ENCOUNTER — Ambulatory Visit (HOSPITAL_BASED_OUTPATIENT_CLINIC_OR_DEPARTMENT_OTHER)
Admission: RE | Admit: 2015-12-12 | Discharge: 2015-12-12 | Disposition: A | Discharge status: Home / Self Care | Payer: Medicare Other | Source: Ambulatory Visit | Attending: Cardiovascular Disease | Admitting: Cardiovascular Disease

## 2015-12-12 ENCOUNTER — Encounter (HOSPITAL_COMMUNITY)
Admission: RE | Disposition: A | Discharge status: Home / Self Care | Payer: Self-pay | Source: Ambulatory Visit | Attending: Cardiovascular Disease

## 2015-12-12 DIAGNOSIS — I2510 Atherosclerotic heart disease of native coronary artery without angina pectoris: Secondary | ICD-10-CM | POA: Insufficient documentation

## 2015-12-12 DIAGNOSIS — I4510 Unspecified right bundle-branch block: Secondary | ICD-10-CM

## 2015-12-12 DIAGNOSIS — Z8546 Personal history of malignant neoplasm of prostate: Secondary | ICD-10-CM

## 2015-12-12 DIAGNOSIS — I2582 Chronic total occlusion of coronary artery: Secondary | ICD-10-CM | POA: Insufficient documentation

## 2015-12-12 DIAGNOSIS — E7800 Pure hypercholesterolemia, unspecified: Secondary | ICD-10-CM

## 2015-12-12 DIAGNOSIS — I351 Nonrheumatic aortic (valve) insufficiency: Secondary | ICD-10-CM

## 2015-12-12 DIAGNOSIS — Z951 Presence of aortocoronary bypass graft: Secondary | ICD-10-CM

## 2015-12-12 DIAGNOSIS — I272 Other secondary pulmonary hypertension: Secondary | ICD-10-CM | POA: Insufficient documentation

## 2015-12-12 DIAGNOSIS — Z7984 Long term (current) use of oral hypoglycemic drugs: Secondary | ICD-10-CM | POA: Insufficient documentation

## 2015-12-12 DIAGNOSIS — I517 Cardiomegaly: Secondary | ICD-10-CM

## 2015-12-12 DIAGNOSIS — Z7982 Long term (current) use of aspirin: Secondary | ICD-10-CM

## 2015-12-12 DIAGNOSIS — I97820 Postprocedural cerebrovascular infarction during cardiac surgery: Secondary | ICD-10-CM | POA: Diagnosis not present

## 2015-12-12 DIAGNOSIS — E119 Type 2 diabetes mellitus without complications: Secondary | ICD-10-CM

## 2015-12-12 DIAGNOSIS — R531 Weakness: Secondary | ICD-10-CM | POA: Diagnosis not present

## 2015-12-12 DIAGNOSIS — G4733 Obstructive sleep apnea (adult) (pediatric): Secondary | ICD-10-CM | POA: Insufficient documentation

## 2015-12-12 DIAGNOSIS — I10 Essential (primary) hypertension: Secondary | ICD-10-CM | POA: Insufficient documentation

## 2015-12-12 DIAGNOSIS — Z01818 Encounter for other preprocedural examination: Secondary | ICD-10-CM

## 2015-12-12 DIAGNOSIS — I350 Nonrheumatic aortic (valve) stenosis: Secondary | ICD-10-CM | POA: Diagnosis not present

## 2015-12-12 DIAGNOSIS — Z87891 Personal history of nicotine dependence: Secondary | ICD-10-CM | POA: Insufficient documentation

## 2015-12-12 HISTORY — PX: CARDIAC CATHETERIZATION: SHX172

## 2015-12-12 HISTORY — PX: PERIPHERAL VASCULAR CATHETERIZATION: SHX172C

## 2015-12-12 LAB — POCT I-STAT 3, ART BLOOD GAS (G3+)
Bicarbonate: 24.1 mEq/L — ABNORMAL HIGH (ref 20.0–24.0)
O2 Saturation: 96 %
PCO2 ART: 34.6 mmHg — AB (ref 35.0–45.0)
PH ART: 7.452 — AB (ref 7.350–7.450)
PO2 ART: 75 mmHg — AB (ref 80.0–100.0)
TCO2: 25 mmol/L (ref 0–100)

## 2015-12-12 LAB — POCT I-STAT 3, VENOUS BLOOD GAS (G3P V)
Bicarbonate: 24.4 mEq/L — ABNORMAL HIGH (ref 20.0–24.0)
O2 SAT: 66 %
PCO2 VEN: 39.1 mmHg — AB (ref 45.0–50.0)
PO2 VEN: 34 mmHg (ref 30.0–45.0)
TCO2: 26 mmol/L (ref 0–100)
pH, Ven: 7.403 — ABNORMAL HIGH (ref 7.250–7.300)

## 2015-12-12 LAB — GLUCOSE, CAPILLARY
GLUCOSE-CAPILLARY: 138 mg/dL — AB (ref 65–99)
GLUCOSE-CAPILLARY: 97 mg/dL (ref 65–99)

## 2015-12-12 SURGERY — RIGHT/LEFT HEART CATH AND CORONARY/GRAFT ANGIOGRAPHY
Anesthesia: LOCAL

## 2015-12-12 MED ORDER — SODIUM CHLORIDE 0.9 % IJ SOLN: 3.0000 mL | INTRAMUSCULAR | Status: DC | PRN

## 2015-12-12 MED ORDER — SODIUM CHLORIDE 0.9 % IV SOLN: INTRAVENOUS | Status: DC

## 2015-12-12 MED ORDER — DIAZEPAM 5 MG PO TABS: 5.0000 mg | ORAL_TABLET | ORAL | Status: DC

## 2015-12-12 MED ORDER — SODIUM CHLORIDE 0.9 % IJ SOLN: 3.0000 mL | Freq: Two times a day (BID) | INTRAMUSCULAR | Status: DC

## 2015-12-12 MED ORDER — SODIUM CHLORIDE 0.9 % IV SOLN: 250.0000 mL | INTRAVENOUS | Status: DC | PRN

## 2015-12-12 MED ORDER — MIDAZOLAM HCL 2 MG/2ML IJ SOLN
INTRAMUSCULAR | Status: DC | PRN
  Administered 2015-12-12: 1 mg via INTRAVENOUS

## 2015-12-12 MED ORDER — ONDANSETRON HCL 4 MG/2ML IJ SOLN: 4.0000 mg | Freq: Four times a day (QID) | INTRAMUSCULAR | Status: DC | PRN

## 2015-12-12 MED ORDER — FENTANYL CITRATE (PF) 100 MCG/2ML IJ SOLN
INTRAMUSCULAR | Status: DC | PRN
  Administered 2015-12-12: 25 ug via INTRAVENOUS

## 2015-12-12 MED ORDER — LIDOCAINE HCL (PF) 1 % IJ SOLN
INTRAMUSCULAR | Status: AC
  Filled 2015-12-12: qty 30

## 2015-12-12 MED ORDER — SODIUM CHLORIDE 0.9 % IV SOLN
INTRAVENOUS | Status: DC
  Administered 2015-12-12: 08:00:00 via INTRAVENOUS

## 2015-12-12 MED ORDER — ASPIRIN 81 MG PO CHEW: 81.0000 mg | CHEWABLE_TABLET | Freq: Once | ORAL | Status: DC

## 2015-12-12 MED ORDER — LIDOCAINE HCL (PF) 1 % IJ SOLN
INTRAMUSCULAR | Status: DC | PRN
  Administered 2015-12-12: 11:00:00

## 2015-12-12 MED ORDER — NITROGLYCERIN 1 MG/10 ML FOR IR/CATH LAB
INTRA_ARTERIAL | Status: AC
  Filled 2015-12-12: qty 10

## 2015-12-12 MED ORDER — MIDAZOLAM HCL 2 MG/2ML IJ SOLN
INTRAMUSCULAR | Status: AC
  Filled 2015-12-12: qty 2

## 2015-12-12 MED ORDER — HEPARIN (PORCINE) IN NACL 2-0.9 UNIT/ML-% IJ SOLN
INTRAMUSCULAR | Status: AC
  Filled 2015-12-12: qty 1000

## 2015-12-12 MED ORDER — FENTANYL CITRATE (PF) 100 MCG/2ML IJ SOLN
INTRAMUSCULAR | Status: AC
  Filled 2015-12-12: qty 2

## 2015-12-12 MED ORDER — ACETAMINOPHEN 325 MG PO TABS: 650.0000 mg | ORAL_TABLET | ORAL | Status: DC | PRN

## 2015-12-12 SURGICAL SUPPLY — 14 items
CATH INFINITI 5 FR IM (CATHETERS) ×1 IMPLANT
CATH INFINITI 5FR MULTPACK ANG (CATHETERS) ×1 IMPLANT
CATH SWAN GANZ 7F STRAIGHT (CATHETERS) ×1 IMPLANT
KIT HEART LEFT (KITS) ×2 IMPLANT
KIT HEART RIGHT NAMIC (KITS) ×2 IMPLANT
PACK CARDIAC CATHETERIZATION (CUSTOM PROCEDURE TRAY) ×2 IMPLANT
SHEATH PINNACLE 6F 10CM (SHEATH) ×1 IMPLANT
SHEATH PINNACLE 7F 10CM (SHEATH) ×1 IMPLANT
SYR MEDRAD MARK V 150ML (SYRINGE) ×2 IMPLANT
TRANSDUCER W/STOPCOCK (MISCELLANEOUS) ×4 IMPLANT
WIRE EMERALD 3MM-J .025X260CM (WIRE) ×1 IMPLANT
WIRE EMERALD 3MM-J .035X150CM (WIRE) ×1 IMPLANT
WIRE EMERALD 3MM-J .035X260CM (WIRE) ×1 IMPLANT
WIRE EMERALD ST .035X260CM (WIRE) ×1 IMPLANT

## 2015-12-12 NOTE — H&P (View-Only) (Signed)
Patient ID: Douglas Carrillo, male   DOB: 28-Jan-1928, 81 y.o.   MRN: [id number]      HPI: Douglas Carrillo is a 81 y.o. male who presents to the office today for a one month cardiology evaluation.  Douglas Carrillo has established CAD and in April 2000 underwent CABG surgery by Dr. Cyndia Bent  (LIMA to the LAD, vein to the diagonal,  vein to the intermediate, and vein to the PDA). In January 2007 cardiac catheterization was done due to exertional dyspnea and mild abnormality on nuclear imaging. This revealed patent vein grafts to the diagonal, marginal, and PDA, but Douglas Carrillo had an atretic LIMA graft to the LAD with evidence for 60% narrowing in the proximal left subclavian. Douglas Carrillo also developed moderate aortic stenosis. A  nuclear perfusion study in May 2013 showed normal perfusion. An echo Doppler study in April 2013 showed mild LVH with normal systolic function. Moderate calcification of the trileaflet aortic valve with a mean gradient of 23 and maximum gradient of 45 with an estimated aortic valve area of 1.1 cm consistent with moderate valvular aortic stenosis. Douglas Carrillo did have mild to moderate aortic insufficiency. A one-year followup echo Doppler study in May 2014 again showed normal systolic function. There was grade 1 diastolic dysfunction. Douglas Carrillo was felt to have moderately to severe aortic stenosis with a mean gradient of 22 peak gradient of 41 and based on an LVOT diameter of 2.0 cm the calculated aortic valve area was approximately 1.0 cm. There is mild AR. Douglas Carrillo did have heavy calcification of the mitral annulus with mild MR with very mild stenosis with a mean gradient of 4 mm. Right ventricle cavity was mildly dilated.   Douglas Carrillo underwent a follow-up echo Doppler study on 10/18/2014 which revealed normal LV function with an ejection fraction of 55-60%.  There was grade 1 diastolic dysfunction.  Douglas Carrillo mean transvalvular aortic gradient was now 27 mm and a peak gradient 54 mm.  Aortic valve area was 1.21 cm concordant with  moderate aortic valve stenosis.  There was mild aortic insufficiency.  There was severe mitral annular calcification.  Douglas Carrillo had mild to moderate left atrial dilatation and mild right atrial dilatation.  Compared to Douglas Carrillo May 2014 study Douglas Carrillo mean transvalvular aorticgradient had increased from 22-27 mm.  Douglas Carrillo sees Dr. Eden Carrillo for Douglas Carrillo primary care. Additional problems include diabetes mellitus for which Douglas Carrillo is on metformin, Victoza, Actos, as well as glipizide. Douglas Carrillo has hyperlipidemia on Crestor and Zetia. Has history of hypertension currently treated with amlodipine, furosemide 40 mg, losartan HCTZ, metoprolol succinate 100 mg, in addition to Douglas Carrillo isosorbide. Douglas Carrillo has been documented to have chronic right bundle branch block. Douglas Carrillo sees Dr. Chase Carrillo for lung disease and recently underwent pulmonary function studies for further evaluation of shortness of breath.  Douglas Carrillo has a history of prostate CA.  Douglas Carrillo tells me Douglas Carrillo recently had  Lupron therapy with improvement in Douglas Carrillo PSA, which had risen.  Since I last saw Douglas Carrillo one-year ago, when a sleep study and was followed by Dr. Gwenette Carrillo with moderate sleep apnea.  Apparently CPAP was not istituted and Douglas Carrillo has been on nocturnal oxygen.  Douglas Carrillo specifically denies chest pain. Douglas Carrillo denies presyncope or syncope. Douglas Carrillo denies CHF symptoms , but experiences mild shortness of breath with activity, but Douglas Carrillo believes this has not changed at all for the past year.    When I saw Douglas Carrillo one month ago, I recommended a follow-up echo Doppler study to reassess Douglas Carrillo aortic stenosis. This was done on  11/24/2015.  Douglas Carrillo continue to have normal systolic function with an ejection fraction of 50-55%, but there was grade 2 diastolic dysfunction. The echo demonstrates significant progression of Douglas Carrillo aortic stenosis which is now severe.  Douglas Carrillo had severely restricted aortic valve mobility with a peak gradient now at 73 and a mean gradient of 42 mmHg.  Aortic valve area was 0.67 cm.  Annular calcification and possible mild  mitral stenosis.  There was moderate left atrial dilatation and mild right atrial dilatation.  RV systolic function was mildly reduced.  Douglas Carrillo denies any episodes of chest pain, but Douglas Carrillo admits to exertional shortness of breath at times with only very minimal activity.   Douglas Carrillo denies presyncope or syncope, but does admit to it occasional episodes of vertigo. Douglas Carrillo presents for reevaluation.   Past Medical History  Diagnosis Date  . Hypertension   . Diabetes mellitus ([place])   . High cholesterol   . Prostate cancer ([place])   . CAD (coronary artery disease)   . OSA (obstructive sleep apnea)     Past Surgical History  Procedure Laterality Date  . Coronary artery bypass graft  03/29/1999    x 4:  LIMA to the LAD,SVG to the diagonal branch of the LAD,SVG to the intermediate coronary artery & SVG to the posterior descending branch of the RCA.  . Colon surgery      x 4  . Nm myocar perf wall motion  04/28/2012    Low Risk Scan  . Prostate surgery      Allergies  Allergen Reactions  . Mold Extract [Trichophyton] Anaphylaxis  . Lipitor [Atorvastatin] Other (See Comments)    Weakness in legs/myalgias    Current Outpatient Prescriptions  Medication Sig Dispense Refill  . amLODipine (NORVASC) 5 MG tablet Take 1 tablet (5 mg total) by mouth daily. 90 tablet 3  . aspirin 81 MG tablet Take 81 mg by mouth daily.    Marland Kitchen b complex vitamins capsule Take 1 capsule by mouth daily.    . B-D ULTRAFINE III SHORT PEN 31G X 8 MM MISC     . benzonatate (TESSALON) 100 MG capsule Take 1 capsule by mouth 3 (three) times daily.  1  . clopidogrel (PLAVIX) 75 MG tablet TAKE 1 TABLET DAILY 90 tablet 3  . CRESTOR 20 MG tablet Take 1 tablet by mouth daily.    Marland Kitchen EPIPEN 2-PAK 0.3 MG/0.3ML SOAJ injection     . furosemide (LASIX) 40 MG tablet TAKE 1 TABLET DAILY 90 tablet 0  . GLIPIZIDE XL 10 MG 24 hr tablet Take 1 tablet by mouth daily.    . isosorbide mononitrate (IMDUR) 60 MG 24 hr tablet TAKE 1 AND 1/2 TABLETS     DAILY 135  tablet 0  . ketotifen (THERA TEARS ALLERGY) 0.025 % ophthalmic solution 1 drop 2 (two) times daily.    Marland Kitchen KLOR-CON M20 20 MEQ tablet TAKE 3 TABLETS DAILY 270 tablet 0  . latanoprost (XALATAN) 0.005 % ophthalmic solution Place 1 drop into both eyes at bedtime.    Marland Kitchen losartan-hydrochlorothiazide (HYZAAR) 100-25 MG per tablet TAKE 1 TABLET DAILY 90 tablet 3  . meclizine (ANTIVERT) 25 MG tablet Take 25 mg by mouth as needed for dizziness.    . metFORMIN (GLUCOPHAGE-XR) 500 MG 24 hr tablet Take 1 tablet by mouth 2 (two) times daily.    . metoprolol succinate (TOPROL-XL) 100 MG 24 hr tablet TAKE 1 TABLET (100 MG)     EVERY MORNING AND TAKE 1/2 TABLET (50  MG) AT BEDTIME 135 tablet 0  . Multiple Vitamins-Minerals (CENTRUM SILVER ULTRA MENS PO) Take 1 tablet by mouth daily.    Marland Kitchen NEXIUM 40 MG capsule Take 1 capsule by mouth daily.    Glory Rosebush DELICA LANCETS 81X MISC 1 each by Other route 2 (two) times daily. Use 1 lancet each to check glucose bid    . ONETOUCH VERIO test strip 1 strip by Other route 2 (two) times daily. Use 1 strip to check glucose bid    . oxybutynin (DITROPAN-XL) 5 MG 24 hr tablet Take 1 tablet by mouth daily.    . pioglitazone (ACTOS) 45 MG tablet Take 1 tablet by mouth daily.    Marland Kitchen PROAIR HFA 108 (90 BASE) MCG/ACT inhaler Inhale into the lungs as needed.    . RESTASIS 0.05 % ophthalmic emulsion Place 1 drop into both eyes 2 (two) times daily.    Marland Kitchen Spacer/Aero-Holding Chambers (AEROCHAMBER PLUS FLO-VU) MISC   1  . Tiotropium Bromide-Olodaterol (STIOLTO RESPIMAT) 2.5-2.5 MCG/ACT AERS Inhale 2 puffs into the lungs daily. 3 Inhaler 3  . VICTOZA 18 MG/3ML SOPN Inject 1.8 mg as directed daily.    . vitamin C (ASCORBIC ACID) 500 MG tablet Take 500 mg by mouth 2 (two) times daily.    . Vitamin D, Ergocalciferol, (DRISDOL) 50000 UNITS CAPS capsule Take 1 capsule by mouth. Every other week    . vitamin E 400 UNIT capsule Take 400 Units by mouth daily.    Marland Kitchen ZETIA 10 MG tablet Take 1 tablet by  mouth daily.     No current facility-administered medications for this visit.    Social History   Social History  . Marital Status: Widowed    Spouse Name: N/A  . Number of Children: N/A  . Years of Education: N/A   Occupational History  . retired    Social History Main Topics  . Smoking status: Former Smoker -- 1.00 packs/day for 30 years    Types: Cigarettes    Quit date: 12/24/1983  . Smokeless tobacco: Never Used     Comment: quit smoking in 1985  . Alcohol Use: 0.0 oz/week    0 Standard drinks or equivalent per week     Comment: rarely  . Drug Use: No  . Sexual Activity: Not on file   Other Topics Concern  . Not on file   Social History Narrative   Social history is notable that Douglas Carrillo is widowed. Douglas Carrillo daughter lives block away. Douglas Carrillo has one child and 2 grandchildren. Douglas Carrillo goes to the gym 4 days a week to exercise.  Family History  Problem Relation Age of Onset  . Kidney disease Brother   . Breast cancer Sister   . Rheum arthritis Mother     ROS General: Negative; No fevers, chills, or night sweats;  HEENT: Negative; No changes in vision or hearing, sinus congestion, difficulty swallowing Pulmonary: positive for mild; No cough, wheezing, , hemoptysis Cardiovascular: See history of present illness  At times, very mild lower extremity edema GI: Negative; No nausea, vomiting, diarrhea, or abdominal pain GU: positive for history of prostate CA; No dysuria, hematuria, or difficulty voiding Musculoskeletal: Negative; no myalgias, joint pain, or weakness Hematologic/Oncology: Negative; no easy bruising, bleeding Endocrine: Positive for type; no heat/cold intolerance Neuro: Negative; no changes in balance, headaches Skin: Negative; No rashes or skin lesions Psychiatric: Negative; No behavioral problems, depression Sleep: Negative; No snoring, daytime sleepiness, hypersomnolence, bruxism, restless legs, hypnogognic hallucinations, no cataplexy Other comprehensive 14 point  system review is negative  PE BP 130/80 mmHg  Pulse 71  Ht 6' (1.829 m)  Wt 208 lb (94.348 kg)  BMI 28.20 kg/m2   Wt Readings from Last 3 Encounters:  12/11/15 208 lb (94.348 kg)  11/13/15 206 lb 4 oz (93.554 kg)  07/04/15 205 lb (92.987 kg)   General: Alert, oriented, no distress.  Skin: normal turgor, no rashes HEENT: Normocephalic, atraumatic. Pupils round and reactive; sclera anicteric;no lid lag.  Nose without nasal septal hypertrophy Mouth/Parynx benign; Mallinpatti scale 2 Neck: No JVD, transmitted murmur to Douglas Carrillo carotids Lungs: clear to ausculatation and percussion; no wheezing or rales Chest wall: Nontender to palpation Heart: RRR, s1 s2 normal 2/6 early to mid peakingaortic stenosis murmur with 1/6 aortic insufficiency; no S3 gallop, no rubs, thrills or heaves Abdomen: soft, nontender; no hepatosplenomehaly, BS+; abdominal aorta nontender and not dilated by palpation. Pulses 2+ Extremities: Trivial ankle edema , right greater than left; , no clubbing cyanosis, Homan's sign negative  Neurologic: grossly nonfocal Psychologic: normal affect and mood.  ECG (independently read by me):  Sinus rhythm at 71 bpm with sinus arrhythmia and occasional PVCs. Right bundle branch block.  Left anterior hemiblock. LVH.  11/21/2016ECG (independently read by me): Normal sinus rhythm with sinus arrhythmia PVC.  Right bundle-branch block and left anterior hemiblock.  November 2015 ECG (independently read by me): normal sinus rhythm.  Right bundle branch block.  Isolated  premature ventricular contraction.  June 2015 ECG (independently read by me): Normal sinus rhythm at 71 beats per minute. RBBB with repolarization changes.  QTc interval 465 ms.  Prior December 2014 ECG: Sinus rhythm at 77 beats per minute with right bundle branch block and associated repolarization changes. Borderline first-degree AV block with a PR interval of 206 ms. QTC of 454 ms.  LABS:  BMP Latest Ref Rng  11/13/2015 07/20/2009  Glucose 65 - 99 mg/dL 74 150(H)  BUN 7 - 25 mg/dL 25 20  Creatinine 0.70 - 1.11 mg/dL 1.36(H) 1.1  Sodium 135 - 146 mmol/L 140 139  Potassium 3.5 - 5.3 mmol/L 3.9 4.3  Chloride 98 - 110 mmol/L 103 104  CO2 20 - 31 mmol/L 25 -  Calcium 8.6 - 10.3 mg/dL 10.0 -   Hepatic Function Latest Ref Rng 11/13/2015  Total Protein 6.1 - 8.1 g/dL 7.5  Albumin 3.6 - 5.1 g/dL 4.0  AST 10 - 35 U/L 17  ALT 9 - 46 U/L 11  Alk Phosphatase 40 - 115 U/L 64  Total Bilirubin 0.2 - 1.2 mg/dL 0.5   CBC Latest Ref Rng 11/13/2015 07/21/2009 07/20/2009  WBC 4.0 - 10.5 K/uL 5.8 - -  Hemoglobin 13.0 - 17.0 g/dL 10.1(L) 10.4(L) 11.9(L)  Hematocrit 39.0 - 52.0 % 32.1(L) - 35.0(L)  Platelets 150 - 400 K/uL 215 - -   Lab Results  Component Value Date   MCV 89.4 11/13/2015   Lab Results  Component Value Date   TSH 2.620 11/13/2015   No results found for: HGBA1C  Lipid Panel     Component Value Date/Time   CHOL 120* 11/13/2015 1020   TRIG 71 11/13/2015 1020   HDL 37* 11/13/2015 1020   CHOLHDL 3.2 11/13/2015 1020   VLDL 14 11/13/2015 1020   LDLCALC 69 11/13/2015 1020   RADIOLOGY: No results found.    ASSESSMENT AND PLAN: Douglas Carrillo is an 81 years old gentleman who is 16 years following Douglas Carrillo CABG revascularization surgery. Douglas Carrillo has been documented to have an atretic LIMA graft but  otherwise patent vein grafts. Douglas Carrillo previous echo Doppler  suggested moderate aortic valve stenosis with an estimated valve area 1.2 cm.  There was slight increase in Douglas Carrillo mean gradient from 22-27 and in Douglas Carrillo peak gradient from 41-54 mm meters.  When I last saw Douglas Carrillo, Douglas Carrillo was not having chest pain or anginal type symptoms but did note exertional dyspnea.  I spent a long time with both Douglas Carrillo and Douglas Carrillo daughter today.  Reviewed Douglas Carrillo most recent echo Doppler study.  This now shows progression of Douglas Carrillo aortic stenosis.  2.  Severe.  Douglas Carrillo mean gradient is now 42 with a peak gradient of 73 mm an estimated aortic valve area of 0.67  cm.  It is my recommendation that definitive right left heart cardiac catheterization be performed.  This will be important to reassess graft patency.  I suspect Douglas Carrillo will need aortic valve replacement surgery, and depending upon Douglas Carrillo coronary anatomy this will affect the decision of whether or not Douglas Carrillo is a candidate for TAVR versus open AVR with redo CABG.  I suspect Douglas Carrillo dyspnea is related to Douglas Carrillo progressive AS.  When given options for timing of this catheterization.  Douglas Carrillo would prefer that this be done as soon as possible.  I will try to schedule this for tomorrow at [facility]. Laboratory will be obtained today. The risks and benefits of a cardiac catheterization including, but not limited to, death, stroke, MI, kidney damage and bleeding were discussed with the patient who indicates understanding and agrees to proceed.     Time spent: 30 minutes Troy Sine, MD, [facility]  12/11/2015 12:29 PM

## 2015-12-12 NOTE — Progress Notes (Signed)
58fr arterial sheath aspirated and removed by Gweneth Dimitri R.N. Manual pressure applied for 7 minutes, then 19fr venous sheath removed. Manual pressure applied to both sites for additional 20 minutes.  Groin level 0, tegaderm dressing applied. Bedrest instructions given.   Bedrest begins at 12:20:00

## 2015-12-12 NOTE — Interval H&P Note (Signed)
Cath Lab Visit (complete for each Cath Lab visit)  Clinical Evaluation Leading to the Procedure:   ACS: No.  Non-ACS:    Anginal Classification: CCS III  Anti-ischemic medical therapy: Maximal Therapy (2 or more classes of medications)  Non-Invasive Test Results: No non-invasive testing performed  Prior CABG: Previous CABG      History and Physical Interval Note:  12/12/2015 10:05 AM  Douglas Carrillo  has presented today for surgery, with the diagnosis of abnormal echo  The various methods of treatment have been discussed with the patient and family. After consideration of risks, benefits and other options for treatment, the patient has consented to  Procedure(s): Right/Left Heart Cath and Coronary/Graft Angiography (N/A) as a surgical intervention .  The patient's history has been reviewed, patient examined, no change in status, stable for surgery.  I have reviewed the patient's chart and labs.  Questions were answered to the patient's satisfaction.     Kaitlan Bin A

## 2015-12-12 NOTE — Progress Notes (Addendum)
Site area: right fa and fv sheaths removed and pressure held by Starleen Blue Site Prior to Removal:  Level  0 Pressure Applied For: 25 minutes Manual:   yes Patient Status During Pull:  stable Post Pull Site:  Level  0 Post Pull Instructions Given:  yes Post Pull Pulses Present: yes Dressing Applied:  tegaderm Bedrest begins @  1220 Comments:

## 2015-12-12 NOTE — Discharge Instructions (Signed)
Angiogram, Care After °Refer to this sheet in the next few weeks. These instructions provide you with information about caring for yourself after your procedure. Your health care provider may also give you more specific instructions. Your treatment has been planned according to current medical practices, but problems sometimes occur. Call your health care provider if you have any problems or questions after your procedure. °WHAT TO EXPECT AFTER THE PROCEDURE °After your procedure, it is typical to have the following: °· Bruising at the catheter insertion site that usually fades within 1-2 weeks. °· Blood collecting in the tissue (hematoma) that may be painful to the touch. It should usually decrease in size and tenderness within 1-2 weeks. °HOME CARE INSTRUCTIONS °· Take medicines only as directed by your health care provider. °· You may shower 24-48 hours after the procedure or as directed by your health care provider. Remove the bandage (dressing) and gently wash the site with plain soap and water. Pat the area dry with a clean towel. Do not rub the site, because this may cause bleeding. °· Do not take baths, swim, or use a hot tub until your health care provider approves. °· Check your insertion site every day for redness, swelling, or drainage. °· Do not apply powder or lotion to the site. °· Do not lift over 10 lb (4.5 kg) for 5 days after your procedure or as directed by your health care provider. °· Ask your health care provider when it is okay to: °¨ Return to work or school. °¨ Resume usual physical activities or sports. °¨ Resume sexual activity. °· Do not drive home if you are discharged the same day as the procedure. Have someone else drive you. °· You may drive 24 hours after the procedure unless otherwise instructed by your health care provider. °· Do not operate machinery or power tools for 24 hours after the procedure or as directed by your health care provider. °· If your procedure was done as an  outpatient procedure, which means that you went home the same day as your procedure, a responsible adult should be with you for the first 24 hours after you arrive home. °· Keep all follow-up visits as directed by your health care provider. This is important. °SEEK MEDICAL CARE IF: °· You have a fever. °· You have chills. °· You have increased bleeding from the catheter insertion site. Hold pressure on the site. °SEEK IMMEDIATE MEDICAL CARE IF: °· You have unusual pain at the catheter insertion site. °· You have redness, warmth, or swelling at the catheter insertion site. °· You have drainage (other than a small amount of blood on the dressing) from the catheter insertion site. °· The catheter insertion site is bleeding, and the bleeding does not stop after 30 minutes of holding steady pressure on the site. °· The area near or just beyond the catheter insertion site becomes pale, cool, tingly, or numb. °  °This information is not intended to replace advice given to you by your health care provider. Make sure you discuss any questions you have with your health care provider. °  °Document Released: 06/27/2005 Document Revised: 12/30/2014 Document Reviewed: 05/12/2013 °Elsevier Interactive Patient Education ©2016 [employer] ° °

## 2015-12-13 ENCOUNTER — Observation Stay (HOSPITAL_COMMUNITY): Payer: Medicare Other

## 2015-12-13 ENCOUNTER — Emergency Department (HOSPITAL_COMMUNITY): Payer: Medicare Other

## 2015-12-13 ENCOUNTER — Encounter (HOSPITAL_COMMUNITY): Payer: Self-pay | Admitting: Emergency Medicine

## 2015-12-13 ENCOUNTER — Inpatient Hospital Stay (HOSPITAL_COMMUNITY)
Admission: EM | Admit: 2015-12-13 | Discharge: 2015-12-15 | DRG: 091 | Disposition: A | Discharge status: Rehabilitation Facility | Payer: Medicare Other | Attending: Internal Medicine | Admitting: Internal Medicine

## 2015-12-13 DIAGNOSIS — Z951 Presence of aortocoronary bypass graft: Secondary | ICD-10-CM | POA: Diagnosis not present

## 2015-12-13 DIAGNOSIS — I639 Cerebral infarction, unspecified: Secondary | ICD-10-CM | POA: Diagnosis present

## 2015-12-13 DIAGNOSIS — Z7982 Long term (current) use of aspirin: Secondary | ICD-10-CM

## 2015-12-13 DIAGNOSIS — I350 Nonrheumatic aortic (valve) stenosis: Secondary | ICD-10-CM | POA: Diagnosis not present

## 2015-12-13 DIAGNOSIS — R7989 Other specified abnormal findings of blood chemistry: Secondary | ICD-10-CM

## 2015-12-13 DIAGNOSIS — I119 Hypertensive heart disease without heart failure: Secondary | ICD-10-CM | POA: Diagnosis present

## 2015-12-13 DIAGNOSIS — I272 Other secondary pulmonary hypertension: Secondary | ICD-10-CM | POA: Diagnosis present

## 2015-12-13 DIAGNOSIS — G4733 Obstructive sleep apnea (adult) (pediatric): Secondary | ICD-10-CM | POA: Diagnosis present

## 2015-12-13 DIAGNOSIS — Z7902 Long term (current) use of antithrombotics/antiplatelets: Secondary | ICD-10-CM | POA: Diagnosis not present

## 2015-12-13 DIAGNOSIS — R531 Weakness: Secondary | ICD-10-CM | POA: Diagnosis present

## 2015-12-13 DIAGNOSIS — I10 Essential (primary) hypertension: Secondary | ICD-10-CM | POA: Diagnosis present

## 2015-12-13 DIAGNOSIS — R29810 Facial weakness: Secondary | ICD-10-CM | POA: Diagnosis present

## 2015-12-13 DIAGNOSIS — G8191 Hemiplegia, unspecified affecting right dominant side: Secondary | ICD-10-CM | POA: Diagnosis present

## 2015-12-13 DIAGNOSIS — I351 Nonrheumatic aortic (valve) insufficiency: Secondary | ICD-10-CM | POA: Diagnosis present

## 2015-12-13 DIAGNOSIS — I25708 Atherosclerosis of coronary artery bypass graft(s), unspecified, with other forms of angina pectoris: Secondary | ICD-10-CM | POA: Diagnosis not present

## 2015-12-13 DIAGNOSIS — Z91048 Other nonmedicinal substance allergy status: Secondary | ICD-10-CM

## 2015-12-13 DIAGNOSIS — E785 Hyperlipidemia, unspecified: Secondary | ICD-10-CM | POA: Diagnosis present

## 2015-12-13 DIAGNOSIS — Z7984 Long term (current) use of oral hypoglycemic drugs: Secondary | ICD-10-CM | POA: Diagnosis not present

## 2015-12-13 DIAGNOSIS — Z87891 Personal history of nicotine dependence: Secondary | ICD-10-CM | POA: Diagnosis not present

## 2015-12-13 DIAGNOSIS — I97820 Postprocedural cerebrovascular infarction during cardiac surgery: Principal | ICD-10-CM | POA: Diagnosis present

## 2015-12-13 DIAGNOSIS — N319 Neuromuscular dysfunction of bladder, unspecified: Secondary | ICD-10-CM | POA: Diagnosis not present

## 2015-12-13 DIAGNOSIS — I63412 Cerebral infarction due to embolism of left middle cerebral artery: Secondary | ICD-10-CM | POA: Diagnosis not present

## 2015-12-13 DIAGNOSIS — Z9981 Dependence on supplemental oxygen: Secondary | ICD-10-CM

## 2015-12-13 DIAGNOSIS — Z8546 Personal history of malignant neoplasm of prostate: Secondary | ICD-10-CM

## 2015-12-13 DIAGNOSIS — Y92009 Unspecified place in unspecified non-institutional (private) residence as the place of occurrence of the external cause: Secondary | ICD-10-CM

## 2015-12-13 DIAGNOSIS — E118 Type 2 diabetes mellitus with unspecified complications: Secondary | ICD-10-CM | POA: Diagnosis not present

## 2015-12-13 DIAGNOSIS — E1159 Type 2 diabetes mellitus with other circulatory complications: Secondary | ICD-10-CM | POA: Diagnosis not present

## 2015-12-13 DIAGNOSIS — J449 Chronic obstructive pulmonary disease, unspecified: Secondary | ICD-10-CM | POA: Diagnosis not present

## 2015-12-13 DIAGNOSIS — N179 Acute kidney failure, unspecified: Secondary | ICD-10-CM | POA: Diagnosis present

## 2015-12-13 DIAGNOSIS — I69359 Hemiplegia and hemiparesis following cerebral infarction affecting unspecified side: Secondary | ICD-10-CM | POA: Diagnosis not present

## 2015-12-13 DIAGNOSIS — Y840 Cardiac catheterization as the cause of abnormal reaction of the patient, or of later complication, without mention of misadventure at the time of the procedure: Secondary | ICD-10-CM | POA: Diagnosis present

## 2015-12-13 DIAGNOSIS — I2510 Atherosclerotic heart disease of native coronary artery without angina pectoris: Secondary | ICD-10-CM | POA: Diagnosis present

## 2015-12-13 DIAGNOSIS — E7800 Pure hypercholesterolemia, unspecified: Secondary | ICD-10-CM | POA: Diagnosis present

## 2015-12-13 DIAGNOSIS — D649 Anemia, unspecified: Secondary | ICD-10-CM | POA: Diagnosis present

## 2015-12-13 DIAGNOSIS — I4510 Unspecified right bundle-branch block: Secondary | ICD-10-CM | POA: Diagnosis present

## 2015-12-13 DIAGNOSIS — W06XXXA Fall from bed, initial encounter: Secondary | ICD-10-CM | POA: Diagnosis present

## 2015-12-13 DIAGNOSIS — I2582 Chronic total occlusion of coronary artery: Secondary | ICD-10-CM | POA: Diagnosis present

## 2015-12-13 DIAGNOSIS — Z888 Allergy status to other drugs, medicaments and biological substances status: Secondary | ICD-10-CM | POA: Diagnosis not present

## 2015-12-13 DIAGNOSIS — I6330 Cerebral infarction due to thrombosis of unspecified cerebral artery: Secondary | ICD-10-CM | POA: Diagnosis not present

## 2015-12-13 DIAGNOSIS — R778 Other specified abnormalities of plasma proteins: Secondary | ICD-10-CM | POA: Diagnosis present

## 2015-12-13 DIAGNOSIS — M6289 Other specified disorders of muscle: Secondary | ICD-10-CM

## 2015-12-13 DIAGNOSIS — Z79899 Other long term (current) drug therapy: Secondary | ICD-10-CM | POA: Diagnosis not present

## 2015-12-13 DIAGNOSIS — E119 Type 2 diabetes mellitus without complications: Secondary | ICD-10-CM | POA: Diagnosis present

## 2015-12-13 DIAGNOSIS — E876 Hypokalemia: Secondary | ICD-10-CM | POA: Diagnosis not present

## 2015-12-13 DIAGNOSIS — R9431 Abnormal electrocardiogram [ECG] [EKG]: Secondary | ICD-10-CM

## 2015-12-13 HISTORY — DX: Anemia, unspecified: D64.9

## 2015-12-13 HISTORY — DX: Gastro-esophageal reflux disease without esophagitis: K21.9

## 2015-12-13 HISTORY — DX: Unspecified osteoarthritis, unspecified site: M19.90

## 2015-12-13 HISTORY — DX: Chronic obstructive pulmonary disease, unspecified: J44.9

## 2015-12-13 HISTORY — DX: Dizziness and giddiness: R42

## 2015-12-13 LAB — COMPREHENSIVE METABOLIC PANEL
ALT: 16 U/L — AB (ref 17–63)
AST: 24 U/L (ref 15–41)
Albumin: 3.5 g/dL (ref 3.5–5.0)
Alkaline Phosphatase: 58 U/L (ref 38–126)
Anion gap: 9 (ref 5–15)
BUN: 22 mg/dL — AB (ref 6–20)
CHLORIDE: 109 mmol/L (ref 101–111)
CO2: 22 mmol/L (ref 22–32)
CREATININE: 1.4 mg/dL — AB (ref 0.61–1.24)
Calcium: 9.5 mg/dL (ref 8.9–10.3)
GFR calc Af Amer: 51 mL/min — ABNORMAL LOW (ref >60)
GFR calc non Af Amer: 44 mL/min — ABNORMAL LOW (ref >60)
Glucose, Bld: 188 mg/dL — ABNORMAL HIGH (ref 65–99)
Potassium: 3.5 mmol/L (ref 3.5–5.1)
SODIUM: 140 mmol/L (ref 135–145)
Total Bilirubin: 0.6 mg/dL (ref 0.3–1.2)
Total Protein: 7.3 g/dL (ref 6.5–8.1)

## 2015-12-13 LAB — URINALYSIS, ROUTINE W REFLEX MICROSCOPIC
Bilirubin Urine: NEGATIVE
Glucose, UA: 100 mg/dL — AB
Hgb urine dipstick: NEGATIVE
Ketones, ur: 15 mg/dL — AB
LEUKOCYTES UA: NEGATIVE
NITRITE: NEGATIVE
PH: 5.5 (ref 5.0–8.0)
Protein, ur: 30 mg/dL — AB
SPECIFIC GRAVITY, URINE: 1.037 — AB (ref 1.005–1.030)

## 2015-12-13 LAB — GLUCOSE, CAPILLARY
GLUCOSE-CAPILLARY: 95 mg/dL (ref 65–99)
Glucose-Capillary: 147 mg/dL — ABNORMAL HIGH (ref 65–99)

## 2015-12-13 LAB — DIFFERENTIAL
BASOS ABS: 0 10*3/uL (ref 0.0–0.1)
BASOS PCT: 0 %
Eosinophils Absolute: 0 10*3/uL (ref 0.0–0.7)
Eosinophils Relative: 1 %
Lymphocytes Relative: 22 %
Lymphs Abs: 1.4 10*3/uL (ref 0.7–4.0)
MONOS PCT: 7 %
Monocytes Absolute: 0.5 10*3/uL (ref 0.1–1.0)
NEUTROS ABS: 4.5 10*3/uL (ref 1.7–7.7)
Neutrophils Relative %: 70 %

## 2015-12-13 LAB — CBC
HEMATOCRIT: 29.6 % — AB (ref 39.0–52.0)
Hemoglobin: 9.4 g/dL — ABNORMAL LOW (ref 13.0–17.0)
MCH: 28.8 pg (ref 26.0–34.0)
MCHC: 31.8 g/dL (ref 30.0–36.0)
MCV: 90.8 fL (ref 78.0–100.0)
Platelets: 196 10*3/uL (ref 150–400)
RBC: 3.26 MIL/uL — ABNORMAL LOW (ref 4.22–5.81)
RDW: 18.5 % — AB (ref 11.5–15.5)
WBC: 6.5 10*3/uL (ref 4.0–10.5)

## 2015-12-13 LAB — I-STAT TROPONIN, ED: Troponin i, poc: 0.47 ng/mL (ref 0.00–0.08)

## 2015-12-13 LAB — URINE MICROSCOPIC-ADD ON

## 2015-12-13 LAB — TROPONIN I
TROPONIN I: 0.37 ng/mL — AB (ref <0.031)
TROPONIN I: 0.33 ng/mL — AB (ref <0.031)
Troponin I: 0.37 ng/mL — ABNORMAL HIGH (ref <0.031)

## 2015-12-13 LAB — PROTIME-INR
INR: 1.18 (ref 0.00–1.49)
Prothrombin Time: 15.2 seconds (ref 11.6–15.2)

## 2015-12-13 LAB — APTT: APTT: 29 s (ref 24–37)

## 2015-12-13 MED ORDER — CYCLOSPORINE 0.05 % OP EMUL
1.0000 [drp] | Freq: Two times a day (BID) | OPHTHALMIC | Status: DC
  Administered 2015-12-13 – 2015-12-15 (×4): 1 [drp] via OPHTHALMIC
  Filled 2015-12-13 (×6): qty 1

## 2015-12-13 MED ORDER — ENOXAPARIN SODIUM 40 MG/0.4ML ~~LOC~~ SOLN
40.0000 mg | SUBCUTANEOUS | Status: DC
  Administered 2015-12-14 – 2015-12-15 (×2): 40 mg via SUBCUTANEOUS
  Filled 2015-12-13: qty 0.4

## 2015-12-13 MED ORDER — STROKE: EARLY STAGES OF RECOVERY BOOK
Freq: Once | Status: AC
  Administered 2015-12-13: 17:00:00
  Filled 2015-12-13: qty 1

## 2015-12-13 MED ORDER — SODIUM CHLORIDE 0.9 % IJ SOLN: 3.0000 mL | INTRAMUSCULAR | Status: DC | PRN

## 2015-12-13 MED ORDER — GADOBENATE DIMEGLUMINE 529 MG/ML IV SOLN
20.0000 mL | Freq: Once | INTRAVENOUS | Status: AC | PRN
  Administered 2015-12-13: 20 mL via INTRAVENOUS

## 2015-12-13 MED ORDER — SODIUM CHLORIDE 0.9 % IV SOLN: 250.0000 mL | INTRAVENOUS | Status: DC | PRN

## 2015-12-13 MED ORDER — ACETAMINOPHEN 650 MG RE SUPP: 650.0000 mg | Freq: Four times a day (QID) | RECTAL | Status: DC | PRN

## 2015-12-13 MED ORDER — ACETAMINOPHEN 325 MG PO TABS
650.0000 mg | ORAL_TABLET | Freq: Four times a day (QID) | ORAL | Status: DC | PRN
  Administered 2015-12-13 – 2015-12-15 (×3): 650 mg via ORAL
  Filled 2015-12-13 (×3): qty 2

## 2015-12-13 MED ORDER — SENNOSIDES-DOCUSATE SODIUM 8.6-50 MG PO TABS: 1.0000 | ORAL_TABLET | Freq: Every evening | ORAL | Status: DC | PRN

## 2015-12-13 MED ORDER — POTASSIUM CHLORIDE CRYS ER 20 MEQ PO TBCR
60.0000 meq | EXTENDED_RELEASE_TABLET | Freq: Every day | ORAL | Status: DC
Start: 2015-12-13 — End: 2015-12-15
  Administered 2015-12-14 – 2015-12-15 (×2): 60 meq via ORAL
  Filled 2015-12-13 (×2): qty 3

## 2015-12-13 MED ORDER — ALBUTEROL SULFATE (2.5 MG/3ML) 0.083% IN NEBU
3.0000 mL | INHALATION_SOLUTION | RESPIRATORY_TRACT | Status: DC | PRN
  Administered 2015-12-14 – 2015-12-15 (×2): 3 mL via RESPIRATORY_TRACT
  Filled 2015-12-13 (×2): qty 3

## 2015-12-13 MED ORDER — HYDROCODONE-ACETAMINOPHEN 5-325 MG PO TABS: 1.0000 | ORAL_TABLET | ORAL | Status: DC | PRN

## 2015-12-13 MED ORDER — INSULIN ASPART 100 UNIT/ML ~~LOC~~ SOLN
0.0000 [IU] | Freq: Three times a day (TID) | SUBCUTANEOUS | Status: DC
  Administered 2015-12-14: 2 [IU] via SUBCUTANEOUS
  Administered 2015-12-14 (×2): 1 [IU] via SUBCUTANEOUS
  Administered 2015-12-15 (×2): 2 [IU] via SUBCUTANEOUS

## 2015-12-13 MED ORDER — ONDANSETRON HCL 4 MG PO TABS: 4.0000 mg | ORAL_TABLET | Freq: Four times a day (QID) | ORAL | Status: DC | PRN

## 2015-12-13 MED ORDER — ONDANSETRON HCL 4 MG/2ML IJ SOLN
4.0000 mg | Freq: Four times a day (QID) | INTRAMUSCULAR | Status: DC | PRN
Start: 2015-12-13 — End: 2015-12-15

## 2015-12-13 MED ORDER — LIRAGLUTIDE 18 MG/3ML ~~LOC~~ SOPN
1.8000 mg | PEN_INJECTOR | SUBCUTANEOUS | Status: DC
  Administered 2015-12-15: 1.8 mg via INTRAMUSCULAR

## 2015-12-13 MED ORDER — OXYBUTYNIN CHLORIDE ER 5 MG PO TB24
5.0000 mg | ORAL_TABLET | Freq: Every day | ORAL | Status: DC
  Administered 2015-12-14 – 2015-12-15 (×2): 5 mg via ORAL
  Filled 2015-12-13 (×3): qty 1

## 2015-12-13 MED ORDER — LATANOPROST 0.005 % OP SOLN
1.0000 [drp] | Freq: Every day | OPHTHALMIC | Status: DC
  Administered 2015-12-13 – 2015-12-14 (×2): 1 [drp] via OPHTHALMIC
  Filled 2015-12-13: qty 2.5

## 2015-12-13 MED ORDER — SODIUM CHLORIDE 0.9 % IJ SOLN
3.0000 mL | Freq: Two times a day (BID) | INTRAMUSCULAR | Status: DC
  Administered 2015-12-13 – 2015-12-14 (×2): 3 mL via INTRAVENOUS

## 2015-12-13 MED ORDER — CLOPIDOGREL BISULFATE 75 MG PO TABS
75.0000 mg | ORAL_TABLET | Freq: Every day | ORAL | Status: DC
  Administered 2015-12-14 – 2015-12-15 (×2): 75 mg via ORAL
  Filled 2015-12-13 (×2): qty 1

## 2015-12-13 MED ORDER — TIOTROPIUM BROMIDE MONOHYDRATE 18 MCG IN CAPS
18.0000 ug | ORAL_CAPSULE | Freq: Every day | RESPIRATORY_TRACT | Status: DC
  Administered 2015-12-14 – 2015-12-15 (×2): 18 ug via RESPIRATORY_TRACT
  Filled 2015-12-13: qty 5

## 2015-12-13 MED ORDER — FUROSEMIDE 40 MG PO TABS
40.0000 mg | ORAL_TABLET | Freq: Every day | ORAL | Status: DC
  Administered 2015-12-14 – 2015-12-15 (×2): 40 mg via ORAL
  Filled 2015-12-13 (×2): qty 1

## 2015-12-13 MED ORDER — ENOXAPARIN SODIUM 30 MG/0.3ML ~~LOC~~ SOLN: 30.0000 mg | Freq: Two times a day (BID) | SUBCUTANEOUS | Status: DC

## 2015-12-13 MED ORDER — TIOTROPIUM BROMIDE-OLODATEROL 2.5-2.5 MCG/ACT IN AERS: 2.0000 | INHALATION_SPRAY | Freq: Every day | RESPIRATORY_TRACT | Status: DC

## 2015-12-13 MED ORDER — ROSUVASTATIN CALCIUM 20 MG PO TABS
20.0000 mg | ORAL_TABLET | Freq: Every day | ORAL | Status: DC
  Administered 2015-12-14 – 2015-12-15 (×2): 20 mg via ORAL
  Filled 2015-12-13 (×3): qty 1

## 2015-12-13 MED ORDER — PANTOPRAZOLE SODIUM 40 MG PO TBEC
40.0000 mg | DELAYED_RELEASE_TABLET | Freq: Every day | ORAL | Status: DC
  Administered 2015-12-14 – 2015-12-15 (×2): 40 mg via ORAL
  Filled 2015-12-13 (×2): qty 1

## 2015-12-13 MED FILL — Nitroglycerin IV Soln 100 MCG/ML in D5W: INTRA_ARTERIAL | Qty: 10 | Status: AC

## 2015-12-13 NOTE — ED Notes (Signed)
Patient transported to CT 

## 2015-12-13 NOTE — ED Notes (Addendum)
Pt arrives via [facility] for c/o right leg weakness that began this morning, pt and family reports that patient was fine before bed last night. Pt had cardiac cath done yesterday. Pt reports his right leg gave away on his this am and he fell, hitting the right side of his back on a dresser.

## 2015-12-13 NOTE — ED Notes (Signed)
Pt unable to void at this time. 

## 2015-12-13 NOTE — ED Provider Notes (Signed)
CSN: [id number]     Arrival date & time 12/13/15  [id number] History   First MD Initiated Contact with Patient 12/13/15 [phone]     Chief Complaint  Patient presents with  . Extremity Weakness     (Consider location/radiation/quality/duration/timing/severity/associated sxs/prior Treatment) HPI Patient with known CAD status post bypass surgery 16 years ago had a cardiac catheterization yesterday without intervention. No complication. Patient states he went home at 5:30 and then went to bed at 7:30 PM. He woke this morning at 6:30 AM and tried to get out of bed. He fell to the ground striking his right side on a bedside table. He had right lower extremity weakness and was unable to stand. Patient also noted to have right arm weakness and inability to hold objects. Patient is right-hand dominant. Family also states right side of the patient's face appears to be drooping. No change in the patient's vision or speech. Patient is at his normal mental status. Patient complains only of pain to the right lateral side of his chest. No loss of consciousness. No neck pain. No head trauma. Past Medical History  Diagnosis Date  . Hypertension   . Diabetes mellitus ([place])   . High cholesterol   . Prostate cancer ([place])   . CAD (coronary artery disease)   . OSA (obstructive sleep apnea)    Past Surgical History  Procedure Laterality Date  . Coronary artery bypass graft  03/29/1999    x 4:  LIMA to the LAD,SVG to the diagonal branch of the LAD,SVG to the intermediate coronary artery & SVG to the posterior descending branch of the RCA.  . Colon surgery      x 4  . Nm myocar perf wall motion  04/28/2012    Low Risk Scan  . Prostate surgery    . Cardiac catheterization    . Cardiac catheterization N/A 12/12/2015    Procedure: Right/Left Heart Cath and Coronary/Graft Angiography;  Surgeon: Troy Sine, MD;  Location: [facility];  Service: Cardiovascular;  Laterality: N/A;  . Peripheral vascular  catheterization N/A 12/12/2015    Procedure: Aortic Arch Angiography;  Surgeon: Troy Sine, MD;  Location: [facility];  Service: Cardiovascular;  Laterality: N/A;   Family History  Problem Relation Age of Onset  . Kidney disease Brother   . Breast cancer Sister   . Rheum arthritis Mother    Social History  Substance Use Topics  . Smoking status: Former Smoker -- 1.00 packs/day for 30 years    Types: Cigarettes    Quit date: 12/24/1983  . Smokeless tobacco: Never Used     Comment: quit smoking in 1985  . Alcohol Use: 0.0 oz/week    0 Standard drinks or equivalent per week     Comment: rarely    Review of Systems  Constitutional: Negative for fever and chills.  HENT: Negative for facial swelling.   Respiratory: Negative for cough and shortness of breath.   Cardiovascular: Positive for chest pain. Negative for palpitations and leg swelling.  Gastrointestinal: Negative for nausea, vomiting and abdominal pain.  Musculoskeletal: Negative for back pain, arthralgias and neck pain.  Skin: Negative for rash and wound.  Neurological: Positive for facial asymmetry and weakness. Negative for dizziness, syncope, light-headedness, numbness and headaches.  All other systems reviewed and are negative.     Allergies  Mold extract and Lipitor  Home Medications   Prior to Admission medications   Medication Sig Start Date End Date Taking? Authorizing  Provider  amLODipine (NORVASC) 5 MG tablet Take 1 tablet (5 mg total) by mouth daily. 08/07/15  Yes Troy Sine, MD  aspirin 81 MG tablet Take 81 mg by mouth daily.   Yes Historical Provider, MD  clopidogrel (PLAVIX) 75 MG tablet TAKE 1 TABLET DAILY 01/30/15  Yes Troy Sine, MD  isosorbide mononitrate (IMDUR) 60 MG 24 hr tablet TAKE 1 AND 1/2 TABLETS     DAILY 11/27/15  Yes Troy Sine, MD  ketotifen (THERA TEARS ALLERGY) 0.025 % ophthalmic solution Place 1 drop into both eyes 2 (two) times daily.    Yes Historical Provider, MD   KLOR-CON M20 20 MEQ tablet TAKE 3 TABLETS DAILY 10/12/15  Yes Troy Sine, MD  losartan-hydrochlorothiazide [facility]) 100-25 MG per tablet TAKE 1 TABLET DAILY 01/30/15  Yes Troy Sine, MD  meclizine (ANTIVERT) 25 MG tablet Take 25 mg by mouth as needed for dizziness.   Yes Historical Provider, MD  metoprolol succinate (TOPROL-XL) 100 MG 24 hr tablet TAKE 1 TABLET (100 MG)     EVERY MORNING AND TAKE 1/2 TABLET (50 MG) AT BEDTIME 10/12/15  Yes Troy Sine, MD  Tiotropium Bromide-Olodaterol (STIOLTO RESPIMAT) 2.5-2.5 MCG/ACT AERS Inhale 2 puffs into the lungs daily. 01/12/15  Yes Brand Males, MD  b complex vitamins capsule Take 1 capsule by mouth daily.    Historical Provider, MD  B-D ULTRAFINE III SHORT PEN 31G X 8 MM MISC  10/19/14   Historical Provider, MD  benzonatate (TESSALON) 100 MG capsule Take 1 capsule by mouth 3 (three) times daily as needed for cough.  06/21/15   Historical Provider, MD  CRESTOR 20 MG tablet Take 20 mg by mouth daily.  10/01/13   Historical Provider, MD  furosemide (LASIX) 40 MG tablet TAKE 1 TABLET DAILY 11/27/15   Troy Sine, MD  GLIPIZIDE XL 10 MG 24 hr tablet Take 10 mg by mouth daily.  08/25/13   Historical Provider, MD  latanoprost (XALATAN) 0.005 % ophthalmic solution Place 1 drop into both eyes at bedtime. 08/25/13   Historical Provider, MD  metFORMIN (GLUCOPHAGE-XR) 500 MG 24 hr tablet Take 500 mg by mouth 2 (two) times daily.  11/08/13   Historical Provider, MD  Multiple Vitamins-Minerals (CENTRUM SILVER ULTRA MENS PO) Take 1 tablet by mouth daily.    Historical Provider, MD  NEXIUM 40 MG capsule Take 40 mg by mouth daily.     Historical Provider, MD  [facility] DELICA LANCETS [id number] MISC 1 each by Other route 2 (two) times daily. Use 1 lancet each to check glucose bid 09/03/15   Historical Provider, MD  [facility] VERIO test strip 1 strip by Other route 2 (two) times daily. Use 1 strip to check glucose bid 09/03/15   Historical Provider, MD  oxybutynin  (DITROPAN-XL) 5 MG 24 hr tablet Take 5 mg by mouth daily.     Historical Provider, MD  pioglitazone (ACTOS) 45 MG tablet Take 1 tablet by mouth daily. 09/19/13   Historical Provider, MD  PROAIR HFA 108 (90 BASE) MCG/ACT inhaler Inhale into the lungs as needed.    Historical Provider, MD  RESTASIS 0.05 % ophthalmic emulsion Place 1 drop into both eyes 2 (two) times daily. 10/01/13   Historical Provider, MD  Spacer/Aero-Holding Chambers (AEROCHAMBER PLUS FLO-VU) [place]  10/10/14   Historical Provider, MD  VICTOZA 18 MG/3ML SOPN Inject 1.8 mg as directed every morning.  11/08/13   Historical Provider, MD  vitamin C (ASCORBIC ACID) 500 MG  tablet Take 500 mg by mouth 2 (two) times daily.    Historical Provider, MD  Vitamin D, Ergocalciferol, (DRISDOL) 50000 UNITS CAPS capsule Take 50,000 Units by mouth every 14 (fourteen) days. Every other week on Sunday 11/08/13   Historical Provider, MD  vitamin E 400 UNIT capsule Take 400 Units by mouth daily.    Historical Provider, MD  ZETIA 10 MG tablet Take 10 mg by mouth daily.  08/25/13   Historical Provider, MD   BP 160/60 mmHg  Pulse 82  Temp(Src) 98.1 F (36.7 C) (Oral)  Resp 18  SpO2 91% Physical Exam  Constitutional: He is oriented to person, place, and time. He appears well-developed and well-nourished. No distress.  HENT:  Head: Normocephalic and atraumatic.  Mouth/Throat: Oropharynx is clear and moist.  Eyes: EOM are normal. Pupils are equal, round, and reactive to light.  Neck: Normal range of motion. Neck supple.  No posterior midline cervical tenderness to palpation.  Cardiovascular: Normal rate and regular rhythm.   Murmur heard. Prominent holosystolic murmur heard best at the left sternal border  Pulmonary/Chest: Effort normal and breath sounds normal. No respiratory distress. He has no wheezes. He has no rales. He exhibits tenderness (tenderness to palpation over the lateral inferior ribs on the right side. There is no obvious crepitance or  deformity.).  Abdominal: Soft. Bowel sounds are normal. He exhibits no distension and no mass. There is no tenderness. There is no rebound and no guarding.  Musculoskeletal: Normal range of motion. He exhibits no edema or tenderness.  Dressing in place at the full of the right femoral vein. There is no active bleeding. No pulsatile masses. No warmth or erythema. Distal pulses  2+. No midline thoracic or lumbar tenderness. Pelvis is stable. Full range of motion of both hips.  Neurological: He is alert and oriented to person, place, and time.  Patient is alert and oriented x3 with clear, goal oriented speech. Slight flattening of the nasolabial fold on the right. Otherwise, cranial nerves course intact. Patient has 5/5 motor in left upper and lower extremities. 4/5 motor in right upper extremity with noticeably weaker grip and mild drift. 2/5 motor in right lower extremity. Sensation is intact to light touch. Bilateral finger-to-nose is normal with no signs of dysmetria.   Skin: Skin is warm and dry. No rash noted. No erythema.  Psychiatric: He has a normal mood and affect. His behavior is normal.  Nursing note and vitals reviewed.   ED Course  Procedures (including critical care time) Labs Review Labs Reviewed  CBC - Abnormal; Notable for the following:    RBC 3.26 (*)    Hemoglobin 9.4 (*)    HCT 29.6 (*)    RDW 18.5 (*)    All other components within normal limits  COMPREHENSIVE METABOLIC PANEL - Abnormal; Notable for the following:    Glucose, Bld 188 (*)    BUN 22 (*)    Creatinine, Ser 1.40 (*)    ALT 16 (*)    GFR calc non Af Amer 44 (*)    GFR calc Af Amer 51 (*)    All other components within normal limits  I-STAT TROPOININ, ED - Abnormal; Notable for the following:    Troponin i, poc 0.47 (*)    All other components within normal limits  PROTIME-INR  APTT  DIFFERENTIAL  URINALYSIS, ROUTINE W REFLEX MICROSCOPIC (NOT AT [facility])    Imaging Review Dg Chest 2  View  12/11/2015  CLINICAL DATA:  History  of coronary artery disease, pre catheterization examination, history of prior tobacco use, COPD. EXAM: CHEST  2 VIEW COMPARISON:  Chest x-ray of September 25th first 2015 and CT scan of the chest of October 24, 2014. FINDINGS: The lungs remain hyperinflated. There is no focal infiltrate. There is no pleural effusion. The cardiac silhouette is mildly enlarged. The central pulmonary vascularity is prominent but there is no cephalization of the vascular pattern. There are 8 intact sternal wires. The patient has undergone previous CABG. The bony thorax exhibits no acute abnormality. IMPRESSION: COPD with superimposed cardiomegaly and mild central pulmonary vascular prominence. These findings are stable. There is no evidence of pneumonia nor pulmonary edema. Electronically Signed   By: Lummie Montijo  [place] M.D.   On: 12/11/2015 10:51   Ct Head Wo Contrast  12/13/2015  CLINICAL DATA:  Recent fall EXAM: CT HEAD WITHOUT CONTRAST TECHNIQUE: Contiguous axial images were obtained from the base of the skull through the vertex without intravenous contrast. COMPARISON:  None. FINDINGS: Bony calvarium is intact. Mild atrophic changes are noted commenced with the patient's given age. No findings to suggest acute hemorrhage, acute infarction or space-occupying mass lesion are identified. IMPRESSION: No acute abnormality noted. Electronically Signed   By: Inez Catalina M.D.   On: 12/13/2015 08:20   Dg Chest Port 1 View  12/13/2015  CLINICAL DATA:  Right leg weakness today. Cardiac catheterization yesterday. EXAM: PORTABLE CHEST 1 VIEW COMPARISON:  Chest CT 10/24/2014.  Radiographs 12/11/2015. FINDINGS: Stable cardiomegaly status post CABG. There is mildly increased vascular congestion without overt pulmonary edema. There is no confluent airspace opacity, pneumothorax or significant pleural effusion. The bones appear unchanged. IMPRESSION: Mildly increased vascular congestion without overt  pulmonary edema or confluent airspace opacity. Stable cardiomegaly. Electronically Signed   By: Richardean Sale M.D.   On: 12/13/2015 07:59   I have personally reviewed and evaluated these images and lab results as part of my medical decision-making.   EKG Interpretation   Date/Time:  Wednesday December 13 2015 07:31:49 EST Ventricular Rate:  89 PR Interval:  217 QRS Duration: 156 QT Interval:  444 QTC Calculation: 540 R Axis:   -83 Text Interpretation:  Sinus rhythm Sinus pause Borderline prolonged PR  interval RBBB and LAFB LVH by voltage Confirmed by Lita Mains  MD, Carnel Stegman  ([id number]) on 12/13/2015 8:59:40 AM      MDM   Final diagnoses:  Cerebral infarction due to unspecified mechanism   Patient presents with right-sided weakness of unknown onset. Last seen normal 7:30 PM yesterday. Code stroke was not initiated. Discussed with neurology. Will see patient in consult. Will admit to [facility] hospitalist.   Discussed with hospitalist and we will admit to telemetry bed.  Julianne Rice, MD 12/13/15 [phone]

## 2015-12-13 NOTE — H&P (Signed)
[facility] History and Physical  Douglas Carrillo [id number] DOB: 05/01/1928 DOA: 12/13/2015  Referring physician: Emergency Department PCP: Douglas Patricia, MD   CHIEF COMPLAINT:   Right-sided weakness       HPI: Douglas Carrillo is a 81 y.o. male  with a past medical history not limited to hypertension, diabetes, obstructive sleep apnea, aortic stenosis , and coronary artery disease status post CABG. Patient has been having problems with dyspnea at home.   Most recent echocardiogram showed progression of the aortic stenosis which Cardiology felt was likely source of dyspnea. To reassess grafts patient underwent cardiac catheterization yesterday. The grafts were patent 16 years post revascularization. There as progressive aortic valve stenosis and was moderately severe LV dysfunction with global hypokinesis and an ejection fraction of 30-35%. Patient was referred to Cardiothoracic surgery for consideration of AVR / TAVR.   Upon arriving home last night patient noticed mild numbness in both of his feet. He was able to ambulate however. When patient awoke this morning and tried to get out of bed he fell because of right lower extremity weakness. He complains of decreased sensation in whole right side of body. No slurred speech. No confusion. No loss of bowel or bladder function.   ED COURSE:          Labs:    Troponin 0.47 BUN 22, creatinine 1.4 Hemoglobin 9.4 MCV 90,  platelets 196  CXR:    Mild pulmonary vascular congestion   EKG:    Sinus rhythm Sinus pause Borderline prolonged PR interval RBBB and LAFB LVH by voltage         QT 540  CT head : no acute abnormalities       Review of Systems  Constitutional: Negative.   HENT: Negative.   Eyes: Negative.   Respiratory: Positive for shortness of breath.   Cardiovascular: Negative.   Gastrointestinal: Negative.   Genitourinary: Negative.   Musculoskeletal: Positive for falls.  Skin: Negative.   Neurological:  Positive for focal weakness.  Endo/Heme/Allergies: Negative.   Psychiatric/Behavioral: Negative.     Past Medical History  Diagnosis Date  . Hypertension   . Diabetes mellitus ([place])   . High cholesterol   . Prostate cancer ([place])   . CAD (coronary artery disease)   . OSA (obstructive sleep apnea)    Past Surgical History  Procedure Laterality Date  . Coronary artery bypass graft  03/29/1999    x 4:  LIMA to the LAD,SVG to the diagonal branch of the LAD,SVG to the intermediate coronary artery & SVG to the posterior descending branch of the RCA.  . Colon surgery      x 4  . Nm myocar perf wall motion  04/28/2012    Low Risk Scan  . Prostate surgery    . Cardiac catheterization    . Cardiac catheterization N/A 12/12/2015    Procedure: Right/Left Heart Cath and Coronary/Graft Angiography;  Surgeon: Douglas Sine, MD;  Location: [facility];  Service: Cardiovascular;  Laterality: N/A;  . Peripheral vascular catheterization N/A 12/12/2015    Procedure: Aortic Arch Angiography;  Surgeon: Douglas Sine, MD;  Location: [facility];  Service: Cardiovascular;  Laterality: N/A;    SOCIAL HISTORY:  reports that he quit smoking about 31 years ago. His smoking use included Cigarettes. He has a 30 pack-year smoking history. He has never used smokeless tobacco. He reports that he drinks alcohol. He reports that he does not use illicit drugs. Lives: at home alone  Assistive devices:   None needed for ambulation.   Allergies  Allergen Reactions  . Mold Extract [Trichophyton] Anaphylaxis  . Lipitor [Atorvastatin] Other (See Comments)    Weakness in legs/myalgias    Family History  Problem Relation Age of Onset  . Kidney disease Brother   . Breast cancer Sister   . Rheum arthritis Mother     Prior to Admission medications   Medication Sig Start Date End Date Taking? Authorizing Provider  amLODipine (NORVASC) 5 MG tablet Take 1 tablet (5 mg total) by mouth daily. 08/07/15  Yes  Douglas Sine, MD  aspirin 81 MG tablet Take 81 mg by mouth daily.   Yes Historical Provider, MD  clopidogrel (PLAVIX) 75 MG tablet TAKE 1 TABLET DAILY 01/30/15  Yes Douglas Sine, MD  isosorbide mononitrate (IMDUR) 60 MG 24 hr tablet TAKE 1 AND 1/2 TABLETS     DAILY 11/27/15  Yes Douglas Sine, MD  ketotifen (THERA TEARS ALLERGY) 0.025 % ophthalmic solution Place 1 drop into both eyes 2 (two) times daily.    Yes Historical Provider, MD  KLOR-CON M20 20 MEQ tablet TAKE 3 TABLETS DAILY 10/12/15  Yes Douglas Sine, MD  losartan-hydrochlorothiazide [facility]) 100-25 MG per tablet TAKE 1 TABLET DAILY 01/30/15  Yes Douglas Sine, MD  meclizine (ANTIVERT) 25 MG tablet Take 25 mg by mouth as needed for dizziness.   Yes Historical Provider, MD  metoprolol succinate (TOPROL-XL) 100 MG 24 hr tablet TAKE 1 TABLET (100 MG)     EVERY MORNING AND TAKE 1/2 TABLET (50 MG) AT BEDTIME 10/12/15  Yes Douglas Sine, MD  Tiotropium Bromide-Olodaterol (STIOLTO RESPIMAT) 2.5-2.5 MCG/ACT AERS Inhale 2 puffs into the lungs daily. 01/12/15  Yes Brand Males, MD  b complex vitamins capsule Take 1 capsule by mouth daily.    Historical Provider, MD  B-D ULTRAFINE III SHORT PEN 31G X 8 MM MISC  10/19/14   Historical Provider, MD  benzonatate (TESSALON) 100 MG capsule Take 1 capsule by mouth 3 (three) times daily as needed for cough.  06/21/15   Historical Provider, MD  CRESTOR 20 MG tablet Take 20 mg by mouth daily.  10/01/13   Historical Provider, MD  furosemide (LASIX) 40 MG tablet TAKE 1 TABLET DAILY 11/27/15   Douglas Sine, MD  GLIPIZIDE XL 10 MG 24 hr tablet Take 10 mg by mouth daily.  08/25/13   Historical Provider, MD  latanoprost (XALATAN) 0.005 % ophthalmic solution Place 1 drop into both eyes at bedtime. 08/25/13   Historical Provider, MD  metFORMIN (GLUCOPHAGE-XR) 500 MG 24 hr tablet Take 500 mg by mouth 2 (two) times daily.  11/08/13   Historical Provider, MD  Multiple Vitamins-Minerals (CENTRUM SILVER ULTRA MENS PO)  Take 1 tablet by mouth daily.    Historical Provider, MD  NEXIUM 40 MG capsule Take 40 mg by mouth daily.     Historical Provider, MD  [facility] DELICA LANCETS [id number] MISC 1 each by Other route 2 (two) times daily. Use 1 lancet each to check glucose bid 09/03/15   Historical Provider, MD  [facility] VERIO test strip 1 strip by Other route 2 (two) times daily. Use 1 strip to check glucose bid 09/03/15   Historical Provider, MD  oxybutynin (DITROPAN-XL) 5 MG 24 hr tablet Take 5 mg by mouth daily.     Historical Provider, MD  pioglitazone (ACTOS) 45 MG tablet Take 1 tablet by mouth daily. 09/19/13   Historical Provider, MD  PROAIR HFA  108 (90 BASE) MCG/ACT inhaler Inhale into the lungs as needed.    Historical Provider, MD  RESTASIS 0.05 % ophthalmic emulsion Place 1 drop into both eyes 2 (two) times daily. 10/01/13   Historical Provider, MD  Spacer/Aero-Holding Chambers (AEROCHAMBER PLUS FLO-VU) [place]  10/10/14   Historical Provider, MD  VICTOZA 18 MG/3ML SOPN Inject 1.8 mg as directed every morning.  11/08/13   Historical Provider, MD  vitamin C (ASCORBIC ACID) 500 MG tablet Take 500 mg by mouth 2 (two) times daily.    Historical Provider, MD  Vitamin D, Ergocalciferol, (DRISDOL) 50000 UNITS CAPS capsule Take 50,000 Units by mouth every 14 (fourteen) days. Every other week on Sunday 11/08/13   Historical Provider, MD  vitamin E 400 UNIT capsule Take 400 Units by mouth daily.    Historical Provider, MD  ZETIA 10 MG tablet Take 10 mg by mouth daily.  08/25/13   Historical Provider, MD   PHYSICAL EXAM: Filed Vitals:   12/13/15 0736  BP: 160/60  Pulse: 82  Temp: 98.1 F (36.7 C)  TempSrc: Oral  Resp: 18  SpO2: 91%    Wt Readings from Last 3 Encounters:  12/12/15 92.987 kg (205 lb)  12/11/15 94.348 kg (208 lb)  11/13/15 93.554 kg (206 lb 4 oz)    General:  Pleasant black male. Appears calm and comfortable Eyes: PER, normal lids, irises & conjunctiva ENT: grossly normal hearing, lips & tongue Neck:  no LAD, no masses Cardiovascular: irregular rhythm, rate is regular. Loud murmur present  1+ RLE edema. Respiratory: Respirations even and unlabored. Normal respiratory effort. Lungs CTA bilaterally, no wheezes / rales .   Abdomen: soft, non-distended, non-tender, active bowel sounds. No obvious masses.  Skin: no rash seen on limited exam Musculoskeletal: Decrease strength in RUE / RLE.  Psychiatric: grossly normal mood and affect, speech fluent and appropriate Neurologic: decreased sensation to touch in RLE. Marland Kitchen         LABS ON ADMISSION:    Basic Metabolic Panel:  Recent Labs Lab 12/11/15 0958 12/13/15 0735  NA 140 140  K 4.1 3.5  CL 105 109  CO2 25 22  GLUCOSE 80 188*  BUN 17 22*  CREATININE 1.36* 1.40*  CALCIUM 9.0 9.5   Liver Function Tests:  Recent Labs Lab 12/13/15 0735  AST 24  ALT 16*  ALKPHOS 58  BILITOT 0.6  PROT 7.3  ALBUMIN 3.5    CBC:  Recent Labs Lab 12/11/15 0958 12/13/15 0735  WBC 5.4 6.5  NEUTROABS  --  4.5  HGB 10.1* 9.4*  HCT 32.5* 29.6*  MCV 90.8 90.8  PLT 236 196    CBG:  Recent Labs Lab 12/12/15 0719 12/12/15 1151  GLUCAP 138* 97    CREATININE: 1.4 mg/dL ABNORMAL (12/13/15 0735) Estimated creatinine clearance - 42.3 mL/min  Radiological Exams on Admission: Dg Chest 2 View  12/11/2015  CLINICAL DATA:  History of coronary artery disease, pre catheterization examination, history of prior tobacco use, COPD. EXAM: CHEST  2 VIEW COMPARISON:  Chest x-ray of September 25th first 2015 and CT scan of the chest of October 24, 2014. FINDINGS: The lungs remain hyperinflated. There is no focal infiltrate. There is no pleural effusion. The cardiac silhouette is mildly enlarged. The central pulmonary vascularity is prominent but there is no cephalization of the vascular pattern. There are 8 intact sternal wires. The patient has undergone previous CABG. The bony thorax exhibits no acute abnormality. IMPRESSION: COPD with superimposed cardiomegaly  and mild central pulmonary  vascular prominence. These findings are stable. There is no evidence of pneumonia nor pulmonary edema. Electronically Signed   By: David  Martinique M.D.   On: 12/11/2015 10:51   Ct Head Wo Contrast  12/13/2015  CLINICAL DATA:  Recent fall EXAM: CT HEAD WITHOUT CONTRAST TECHNIQUE: Contiguous axial images were obtained from the base of the skull through the vertex without intravenous contrast. COMPARISON:  None. FINDINGS: Bony calvarium is intact. Mild atrophic changes are noted commenced with the patient's given age. No findings to suggest acute hemorrhage, acute infarction or space-occupying mass lesion are identified. IMPRESSION: No acute abnormality noted. Electronically Signed   By: Inez Catalina M.D.   On: 12/13/2015 08:20   Dg Chest Port 1 View  12/13/2015  CLINICAL DATA:  Right leg weakness today. Cardiac catheterization yesterday. EXAM: PORTABLE CHEST 1 VIEW COMPARISON:  Chest CT 10/24/2014.  Radiographs 12/11/2015. FINDINGS: Stable cardiomegaly status post CABG. There is mildly increased vascular congestion without overt pulmonary edema. There is no confluent airspace opacity, pneumothorax or significant pleural effusion. The bones appear unchanged. IMPRESSION: Mildly increased vascular congestion without overt pulmonary edema or confluent airspace opacity. Stable cardiomegaly. Electronically Signed   By: Richardean Sale M.D.   On: 12/13/2015 07:59     ASSESSMENT / PLAN   Right sided weakness. CT head negative for abnormalities.  -admit to observation - telemetry bed -Neurology has evaluated and recommends full stroke workup including MRI /MRA of brain. Will also obtain MRA of neck.  -Swallow evaluation.  -Fasting lipids and hgb A1C -OT. PT. SLP consults -frequent neurochecks -continue plavix    Aortic valve stenosis, moderately severe. -To see Dr. Cyndia Bent for evaluation of AVR candidacy with possibility of TAVR.  Diabetes, type 2 -hold home oral diabetic  medications -Monitor CBG and start SSI  CAD / remote CABG. Patient was having dyspnea, he underwent outpatient cath yesterday. Grafts found to be patent.  He had moderately severe LV dysfunction with global hypokinesis and an ejection fraction of 30%-35%.  Elevated troponin 0.47 but this is post-cath (yesterday) -cycle troponin  Prolonged Q-T interval on ECG  Hypertension. Stable.  -hold home anti-hypertensives while evaluating for CVA  COPD. Stable.  -continue home inhalers  AKI, Unclear if he had any chronic kidney disease. Mildly elevated creatinine since November, normal prior to that but labs were from 2010. Follow up outpatient.   Normocytic anemia, chronic. Baseline hgb 10.1, at 9.4 today.   CONSULTANTS:   Neurology   Code Status: Limited. Bipap only.  DVT Prophylaxis: Lovenox  Family Communication:  Patient alert, oriented and understands plan of care.  Disposition Plan: Discharge to home in  24-48 hours   Time spent: 60 minutes Tye Savoy  NP [facility] Pager [phone]

## 2015-12-13 NOTE — Consult Note (Addendum)
Chief Complaint: R arm and leg weakness  HPI:                                                                                                                                         Douglas Carrillo is an 81 y.o. male with hx of HTN, HLP, DM, CABG 16 years ago on plavix who has been having SOB on exertion who had an outpatient cardiac cath yesterday. Went home at his baseline but woke up this am with R sided arm and leg weakness.   Date last known well: 12/12/15 Time last known well: 7:30pm tPA Given: No: wake up stroke  No Symptoms         0 No significant disability/able to carry out all usual activities   1 Unable to carry out all previous activities but looks after own affairs             2 Requires help but walks without assistance     3 Unable to walk without assistance/unable to handle own bodily needs 4 Bedridden/incontinent        5 Dead          6  Modified Rankin: Rankin Score=0    Past Medical History  Diagnosis Date  . Hypertension   . Diabetes mellitus ([place])   . High cholesterol   . Prostate cancer ([place])   . CAD (coronary artery disease)   . OSA (obstructive sleep apnea)     Past Surgical History  Procedure Laterality Date  . Coronary artery bypass graft  03/29/1999    x 4:  LIMA to the LAD,SVG to the diagonal branch of the LAD,SVG to the intermediate coronary artery & SVG to the posterior descending branch of the RCA.  . Colon surgery      x 4  . Nm myocar perf wall motion  04/28/2012    Low Risk Scan  . Prostate surgery    . Cardiac catheterization    . Cardiac catheterization N/A 12/12/2015    Procedure: Right/Left Heart Cath and Coronary/Graft Angiography;  Surgeon: Troy Sine, MD;  Location: [facility];  Service: Cardiovascular;  Laterality: N/A;  . Peripheral vascular catheterization N/A 12/12/2015    Procedure: Aortic Arch Angiography;  Surgeon: Troy Sine, MD;  Location: [facility];  Service: Cardiovascular;  Laterality: N/A;     Family History  Problem Relation Age of Onset  . Kidney disease Brother   . Breast cancer Sister   . Rheum arthritis Mother    Social History:  reports that he quit smoking about 31 years ago. His smoking use included Cigarettes. He has a 30 pack-year smoking history. He has never used smokeless tobacco. He reports that he drinks alcohol. He reports that he does not use illicit drugs.  Allergies:  Allergies  Allergen Reactions  . Mold Extract [Trichophyton] Anaphylaxis  .  Lipitor [Atorvastatin] Other (See Comments)    Weakness in legs/myalgias    Medications:                                                                                                                           I have reviewed the patient's current medications.  ROS:                                                                                                                                       History obtained from the patient  General ROS: negative for - chills, fatigue, fever, night sweats, weight gain or weight loss Psychological ROS: negative for - behavioral disorder, hallucinations, memory difficulties, mood swings or suicidal ideation Ophthalmic ROS: negative for - blurry vision, double vision, eye pain or loss of vision ENT ROS: negative for - epistaxis, nasal discharge, oral lesions, sore throat, tinnitus or vertigo Allergy and Immunology ROS: negative for - hives or itchy/watery eyes Hematological and Lymphatic ROS: negative for - bleeding problems, bruising or swollen lymph nodes Endocrine ROS: negative for - galactorrhea, hair pattern changes, polydipsia/polyuria or temperature intolerance Respiratory ROS: negative for - cough, hemoptysis, shortness of breath or wheezing Cardiovascular ROS: negative for - chest pain, dyspnea on exertion, edema or irregular heartbeat Gastrointestinal ROS: negative for - abdominal pain, diarrhea, hematemesis, nausea/vomiting or stool incontinence Genito-Urinary  ROS: negative for - dysuria, hematuria, incontinence or urinary frequency/urgency Musculoskeletal ROS: negative for - joint swelling or muscular weakness Neurological ROS: as noted in HPI Dermatological ROS: negative for rash and skin lesion changes  Neurologic Examination:                                                                                                      Blood pressure 160/60, pulse 82, temperature 98.1 F (36.7 C), temperature source Oral, resp. rate 18, SpO2 91 %.  HEENT-  Normocephalic, no lesions, without obvious abnormality.  Normal external eye and conjunctiva.  Normal  TM's bilaterally.  Normal auditory canals and external ears. Normal external nose, mucus membranes and septum.  Normal pharynx. Cardiovascular- regular rate and rhythm, S1, S2 normal, no murmur, click, rub or gallop, pulses palpable throughout   Lungs- chest clear, no wheezing, rales, normal symmetric air entry, Heart exam - S1, S2 normal, no murmur, no gallop, rate regular Abdomen- soft, non-tender; bowel sounds normal; no masses,  no organomegaly Extremities- no edema Lymph-no adenopathy palpable Musculoskeletal-no joint tenderness, deformity or swelling Skin-warm and dry, no hyperpigmentation, vitiligo, or suspicious lesions  Neurological Examination Mental Status: Alert, oriented, thought content appropriate.  Speech fluent without evidence of aphasia.  Able to follow 3 step commands without difficulty. Cranial Nerves: II:  Visual fields grossly normal, pupils equal, round, reactive to light and accommodation III,IV, VI: ptosis not present, extra-ocular motions intact bilaterally V,VII: smile asymmetric, R facial droop, facial light touch sensation normal bilaterally VIII: hearing normal bilaterally IX,X: uvula rises symmetrically XI: bilateral shoulder shrug XII: midline tongue extension Motor: Right : Upper extremity   3/5 pronator drift    Left:     Upper extremity   5/5  Lower extremity    2/5     Lower extremity   5/5 Tone and bulk:normal tone throughout; no atrophy noted Sensory: Pinprick and light touch decreased on the R arm and leg, worse on the leg Cerebellar: normal finger-to-nose, normal rapid alternating movements and normal heel-to-shin test Gait: not tested       Lab Results: Basic Metabolic Panel:  Recent Labs Lab 12/11/15 0958 12/13/15 0735  NA 140 140  K 4.1 3.5  CL 105 109  CO2 25 22  GLUCOSE 80 188*  BUN 17 22*  CREATININE 1.36* 1.40*  CALCIUM 9.0 9.5    Liver Function Tests:  Recent Labs Lab 12/13/15 0735  AST 24  ALT 16*  ALKPHOS 58  BILITOT 0.6  PROT 7.3  ALBUMIN 3.5   No results for input(s): LIPASE, AMYLASE in the last 168 hours. No results for input(s): AMMONIA in the last 168 hours.  CBC:  Recent Labs Lab 12/11/15 0958 12/13/15 0735  WBC 5.4 6.5  NEUTROABS  --  4.5  HGB 10.1* 9.4*  HCT 32.5* 29.6*  MCV 90.8 90.8  PLT 236 196    Cardiac Enzymes: No results for input(s): CKTOTAL, CKMB, CKMBINDEX, TROPONINI in the last 168 hours.  Lipid Panel: No results for input(s): CHOL, TRIG, HDL, CHOLHDL, VLDL, LDLCALC in the last 168 hours.  CBG:  Recent Labs Lab 12/12/15 0719 12/12/15 1151  GLUCAP 138* 97    Microbiology: Results for orders placed or performed during the hospital encounter of 07/21/09  Anaerobic culture     Status: None   Collection Time: 07/21/09  3:24 PM  Result Value Ref Range Status   Specimen Description WOUND  Final   Special Requests RIGHT INDEX FINGER  Final   Gram Stain   Final    NO WBC SEEN NO SQUAMOUS EPITHELIAL CELLS SEEN NO ORGANISMS SEEN   Culture NO ANAEROBES ISOLATED  Final   Report Status 07/29/2009 FINAL  Final  Wound culture     Status: None   Collection Time: 07/21/09  3:24 PM  Result Value Ref Range Status   Specimen Description WOUND  Final   Special Requests RIGHT INDEX FINGER  Final   Gram Stain   Final    NO WBC SEEN NO SQUAMOUS EPITHELIAL CELLS SEEN NO  ORGANISMS SEEN   Culture NO GROWTH 2 DAYS  Final  Report Status 07/27/2009 FINAL  Final    Coagulation Studies:  Recent Labs  12/11/15 0958 12/13/15 0735  LABPROT 14.2 15.2  INR 1.09 1.18    Imaging: Dg Chest 2 View  12/11/2015  CLINICAL DATA:  History of coronary artery disease, pre catheterization examination, history of prior tobacco use, COPD. EXAM: CHEST  2 VIEW COMPARISON:  Chest x-ray of September 25th first 2015 and CT scan of the chest of October 24, 2014. FINDINGS: The lungs remain hyperinflated. There is no focal infiltrate. There is no pleural effusion. The cardiac silhouette is mildly enlarged. The central pulmonary vascularity is prominent but there is no cephalization of the vascular pattern. There are 8 intact sternal wires. The patient has undergone previous CABG. The bony thorax exhibits no acute abnormality. IMPRESSION: COPD with superimposed cardiomegaly and mild central pulmonary vascular prominence. These findings are stable. There is no evidence of pneumonia nor pulmonary edema. Electronically Signed   By: David  Martinique M.D.   On: 12/11/2015 10:51   Ct Head Wo Contrast  12/13/2015  CLINICAL DATA:  Recent fall EXAM: CT HEAD WITHOUT CONTRAST TECHNIQUE: Contiguous axial images were obtained from the base of the skull through the vertex without intravenous contrast. COMPARISON:  None. FINDINGS: Bony calvarium is intact. Mild atrophic changes are noted commenced with the patient's given age. No findings to suggest acute hemorrhage, acute infarction or space-occupying mass lesion are identified. IMPRESSION: No acute abnormality noted. Electronically Signed   By: Inez Catalina M.D.   On: 12/13/2015 08:20   Dg Chest Port 1 View  12/13/2015  CLINICAL DATA:  Right leg weakness today. Cardiac catheterization yesterday. EXAM: PORTABLE CHEST 1 VIEW COMPARISON:  Chest CT 10/24/2014.  Radiographs 12/11/2015. FINDINGS: Stable cardiomegaly status post CABG. There is mildly increased  vascular congestion without overt pulmonary edema. There is no confluent airspace opacity, pneumothorax or significant pleural effusion. The bones appear unchanged. IMPRESSION: Mildly increased vascular congestion without overt pulmonary edema or confluent airspace opacity. Stable cardiomegaly. Electronically Signed   By: Richardean Sale M.D.   On: 12/13/2015 07:59      Assessment: 81 y.o. male with hx of HTN, HLP, DM, CABG 16 years ago on plavix who has been having SOB on exertion who had an outpatient cardiac cath yesterday. Went home at his baseline but woke up this am with R sided arm and leg weakness. - CTH negative - Based on exam is consistent with L lacunar infract, most likely as a result of cardiac cath - Full stroke workup as follows - 1. HgbA1c, fasting lipid panel 2. MRI, MRA  of the brain without contrast 3. PT consult, OT consult, Speech consult 4. Echocardiogram 5. Carotid dopplers 6. Prophylactic therapy-Antiplatelet med: Plavix - dose 75mg  7. Risk factor modification 8. Telemetry monitoring 9. Frequent neuro checks 10 NPO until passes stroke swallow screen   Stroke Risk Factors - diabetes mellitus, hyperlipidemia and hypertension

## 2015-12-13 NOTE — Progress Notes (Signed)
Pt arrived to 5C06@ 1150, Pt A&Ox 4, c/o pain 0/10. Marland Kitchen Pt VS taken, Pt without distress. Family at the bedside. Diet ordered, will monitor.

## 2015-12-14 DIAGNOSIS — I63412 Cerebral infarction due to embolism of left middle cerebral artery: Secondary | ICD-10-CM

## 2015-12-14 DIAGNOSIS — I350 Nonrheumatic aortic (valve) stenosis: Secondary | ICD-10-CM

## 2015-12-14 DIAGNOSIS — I2510 Atherosclerotic heart disease of native coronary artery without angina pectoris: Secondary | ICD-10-CM

## 2015-12-14 DIAGNOSIS — I10 Essential (primary) hypertension: Secondary | ICD-10-CM

## 2015-12-14 DIAGNOSIS — I639 Cerebral infarction, unspecified: Secondary | ICD-10-CM

## 2015-12-14 DIAGNOSIS — R7989 Other specified abnormal findings of blood chemistry: Secondary | ICD-10-CM

## 2015-12-14 DIAGNOSIS — D649 Anemia, unspecified: Secondary | ICD-10-CM

## 2015-12-14 DIAGNOSIS — E119 Type 2 diabetes mellitus without complications: Secondary | ICD-10-CM

## 2015-12-14 LAB — GLUCOSE, CAPILLARY
GLUCOSE-CAPILLARY: 140 mg/dL — AB (ref 65–99)
GLUCOSE-CAPILLARY: 200 mg/dL — AB (ref 65–99)
GLUCOSE-CAPILLARY: 143 mg/dL — AB (ref 65–99)
Glucose-Capillary: 135 mg/dL — ABNORMAL HIGH (ref 65–99)

## 2015-12-14 LAB — BASIC METABOLIC PANEL
Anion gap: 9 (ref 5–15)
BUN: 16 mg/dL (ref 6–20)
CALCIUM: 8.9 mg/dL (ref 8.9–10.3)
CHLORIDE: 110 mmol/L (ref 101–111)
CO2: 23 mmol/L (ref 22–32)
CREATININE: 1.1 mg/dL (ref 0.61–1.24)
GFR calc non Af Amer: 59 mL/min — ABNORMAL LOW (ref >60)
GLUCOSE: 151 mg/dL — AB (ref 65–99)
Potassium: 3.6 mmol/L (ref 3.5–5.1)
Sodium: 142 mmol/L (ref 135–145)

## 2015-12-14 LAB — CBC
HCT: 27.7 % — ABNORMAL LOW (ref 39.0–52.0)
Hemoglobin: 8.7 g/dL — ABNORMAL LOW (ref 13.0–17.0)
MCH: 28.2 pg (ref 26.0–34.0)
MCHC: 31.4 g/dL (ref 30.0–36.0)
MCV: 89.9 fL (ref 78.0–100.0)
PLATELETS: 198 10*3/uL (ref 150–400)
RBC: 3.08 MIL/uL — ABNORMAL LOW (ref 4.22–5.81)
RDW: 18.5 % — AB (ref 11.5–15.5)
WBC: 5.7 10*3/uL (ref 4.0–10.5)

## 2015-12-14 LAB — LIPID PANEL
CHOL/HDL RATIO: 2.8 ratio
CHOLESTEROL: 105 mg/dL (ref 0–200)
HDL: 37 mg/dL — AB (ref >40)
LDL Cholesterol: 53 mg/dL (ref 0–99)
Triglycerides: 76 mg/dL (ref <150)
VLDL: 15 mg/dL (ref 0–40)

## 2015-12-14 MED ORDER — ASPIRIN 81 MG PO CHEW
81.0000 mg | CHEWABLE_TABLET | Freq: Every day | ORAL | Status: DC
  Administered 2015-12-14 – 2015-12-15 (×2): 81 mg via ORAL
  Filled 2015-12-14: qty 1

## 2015-12-14 NOTE — Progress Notes (Signed)
Came by to check on patient after hearing about his admission. Pt had tolerated cath well, but later developed deferred R sided arm and leg weakness.  MRI revealed a small probable embolic infarct along the ventral surface of the left medulla without hemorrhage or swelling and mild chronic small-vessel ischemic changes elsewhere affecting the brain typical for age.  I had arranged patient to have an outpatient evaluation with Dr. Cyndia Bent for 12/28 for consideration of TAVR candidacy. Depending on neurological recovery and need for rehab this may need to be deferred to a future date.  Troy Sine, MD  12/14/2015  1:49 PM

## 2015-12-14 NOTE — Progress Notes (Signed)
Occupational Therapy Evaluation Patient Details Name: Douglas Carrillo MRN: [id number] DOB: 05-30-1928 Today's Date: 12/14/2015    History of Present Illness 81 y.o. male admitted for R side weakness resulting in 2 falls on 12/13/15. MRI (+) for L medulla infarct. Pt is s/p a cardiac cath performed on 12/12/15. PMH significant for HTN, DM, prostate cancer, CAD, CABG x4, dyspnea on exertion, OSA.    Clinical Impression   PTA, pt was independent with all ADLs and mobility. Pt currently presents with R side hemiparesis and decreased sensation, acute R low back pain after fall at home and required max assist for sit-stand transfers. Pt's daughter is a semi-retired Therapist, sports and can provide 24/7 care upon d/c according to pt. Feel pt is an excellent candidate for CIR if his pt class changes from observation-inpatient.    Follow Up Recommendations  CIR (if pt class changes to inpatient)   Equipment Recommendations  Other (comment) (TBD in next venue)    Recommendations for Other Services Rehab consult     Precautions / Restrictions Precautions Precautions: Fall Restrictions Weight Bearing Restrictions: No      Mobility Bed Mobility Overal bed mobility: Needs Assistance Bed Mobility: Rolling;Sidelying to Sit;Sit to Supine Rolling: Min assist Sidelying to sit: Mod assist   Sit to supine: Mod assist   General bed mobility comments: HOB flat, use of bedrails. Mod assist to support trunk to come to sitting position and to scoot hips to EOB.  Transfers Overall transfer level: Needs assistance Equipment used: 1 person hand held assist Transfers: Sit to/from Stand Sit to Stand: Max assist         General transfer comment: Sit-stand x2. Max assist for R knee blocking and facilitation of R hip extension upon standing. Verbal cues to protect RUE during transfer.    Balance Overall balance assessment: Needs assistance Sitting-balance support: Bilateral upper extremity supported;Feet  supported Sitting balance-Leahy Scale: Poor Sitting balance - Comments: Min assist to maintain sitting balance. R lateral and posterior lean Postural control: Posterior lean;Right lateral lean Standing balance support: Single extremity supported Standing balance-Leahy Scale: Poor Standing balance comment: Required LUE support (hand held or on RW)                            ADL Overall ADL's : Needs assistance/impaired                                             Vision Vision Assessment?: Yes Eye Alignment: Within Functional Limits Ocular Range of Motion: Within Functional Limits Alignment/Gaze Preference: Within Defined Limits Tracking/Visual Pursuits: Right eye does not track medially;Left eye does not track medially;Requires cues, head turns, or add eye shifts to track Convergence: Impaired (comment) (minimal eye movement)   Perception     Praxis      Pertinent Vitals/Pain Pain Assessment: Faces Faces Pain Scale: Hurts even more Pain Location: R low back from fall Pain Descriptors / Indicators: Tender;Sore Pain Intervention(s): Limited activity within patient's tolerance;Monitored during session;Repositioned     Hand Dominance Right   Extremity/Trunk Assessment Upper Extremity Assessment Upper Extremity Assessment: RUE deficits/detail RUE Deficits / Details: STRENGTH: shoulder 2-/5, biceps/triceps 3/5, decreased grip strength; AROM shoulder 50 degrees, elbow WFL, wrist flexion 60 degrees, wrist extension 30 degrees , forearm pronation WFL, forearm supination 60 degrees, unable to achieve full extension of digits  4 and 5; SENSATION: decreased light touch/numbness from elbow down, more severe distally RUE Sensation: decreased light touch;decreased proprioception RUE Coordination: decreased fine motor;decreased gross motor   Lower Extremity Assessment Lower Extremity Assessment: RLE deficits/detail RLE Deficits / Details: Unable to bend knee  fully in bed, able to move leg off bed; SENSATION: numbness from knee down RLE Sensation: decreased light touch;decreased proprioception RLE Coordination: decreased gross motor   Cervical / Trunk Assessment Cervical / Trunk Assessment: Kyphotic   Communication Communication Communication: No difficulties   Cognition Arousal/Alertness: Awake/alert Behavior During Therapy: WFL for tasks assessed/performed Overall Cognitive Status: Within Functional Limits for tasks assessed                     General Comments       Exercises       Shoulder Instructions      Home Living Family/patient expects to be discharged to:: Private residence Living Arrangements: Alone Available Help at Discharge: Family;Available 24 hours/day Type of Home: House Home Access: Stairs to enter [employer] of Steps: 2 Entrance Stairs-Rails: None Home Layout: One level     Bathroom Shower/Tub: Tub/shower unit;Curtain Shower/tub characteristics: Architectural technologist: Standard     Home Equipment: Radio producer - single point   Additional Comments: Daughter lives around the corner, is a Risk manager and pt states she can provide 24/7 care if needed.      Prior Functioning/Environment Level of Independence: Independent        Comments: Drives, goes to the gym everyday, and like watching sports (especially college basketball)    OT Diagnosis: Generalized weakness;Hemiplegia dominant side;Acute pain   OT Problem List: Decreased strength;Decreased range of motion;Decreased activity tolerance;Impaired balance (sitting and/or standing);Decreased coordination;Decreased safety awareness;Decreased knowledge of use of DME or AE;Impaired sensation;Impaired tone;Obesity;Impaired UE functional use;Pain   OT Treatment/Interventions: Self-care/ADL training;Therapeutic exercise;Neuromuscular education;Energy conservation;DME and/or AE instruction;Therapeutic activities;Balance  training;Patient/family education    OT Goals(Current goals can be found in the care plan section) Acute Rehab OT Goals Patient Stated Goal: to get up and moving OT Goal Formulation: With patient Time For Goal Achievement: 12/28/15 Potential to Achieve Goals: Good ADL Goals Pt Will Perform Eating: with set-up;with adaptive utensils;sitting Pt Will Perform Grooming: with set-up;sitting (using built-up handle utensils.) Pt Will Perform Upper Body Bathing: with set-up;sitting (using built-up handle utensils) Pt Will Perform Upper Body Dressing: with supervision;with caregiver independent in assisting;sitting Pt Will Transfer to Toilet: with mod assist;ambulating;bedside commode Additional ADL Goal #1: Pt/caregiver will independently demonstrate RUE protection strategies during activity and at rest.  OT Frequency: Min 3X/week   Barriers to D/C:            Co-evaluation              End of Session Equipment Utilized During Treatment: Gait belt Nurse Communication: Mobility status;Precautions;Other (comment) (Pillow underneath RUE at all times)  Activity Tolerance: Patient tolerated treatment well Patient left: in bed;with call bell/phone within reach;with bed alarm set;with family/visitor present   Time: [id number] OT Time Calculation (min): 41 min Charges:  OT General Charges $OT Visit: 1 Procedure OT Evaluation $Initial OT Evaluation Tier I: 1 Procedure OT Treatments $Self Care/Home Management : 23-37 mins G-Codes: OT G-codes **NOT FOR INPATIENT CLASS** Functional Assessment Tool Used: clinical judgement Functional Limitation: Changing and maintaining body position Changing and Maintaining Body Position Current Status [id number]): At least 40 percent but less than 60 percent impaired, limited or restricted Changing and Maintaining Body Position Goal Status [id number]): At least  20 percent but less than 40 percent impaired, limited or restricted  Redmond Baseman, OTR/L 12/14/2015,  10:20 AM  Pager: [phone]

## 2015-12-14 NOTE — Progress Notes (Signed)
Rehab Admissions Coordinator Note:  Patient was screened by [name] for appropriateness for an Inpatient Acute Rehab Consult.  At this time, patient is under observation status.  If status changes to inpatient status, then recommend inpatient rehab consult.  Call me for questions.   [name] 12/14/2015, 11:12 AM  I can be reached at [phone].

## 2015-12-14 NOTE — Evaluation (Signed)
Speech Language Pathology Evaluation Patient Details Name: Douglas Carrillo MRN: [id number] DOB: [birth year] Today's Date: 12/14/2015 Time: [id number] SLP Time Calculation (min) (ACUTE ONLY): 14 min  Problem List:  Patient Active Problem List   Diagnosis Date Noted  . CVA (cerebral infarction) 12/13/2015  . Prolonged Q-T interval on ECG 12/13/2015  . Right sided weakness 12/13/2015  . Elevated troponin 12/13/2015  . Acute kidney failure ([place]) 12/13/2015  . Normocytic anemia 12/13/2015  . Diabetes mellitus with complication ([place])   . CAD in native artery   . Aortic stenosis   . OSA (obstructive sleep apnea) 02/08/2015  . COPD, moderate ([place]) 01/12/2015  . COPD (chronic obstructive pulmonary disease) ([place]) 11/10/2014  . Nocturnal hypoxia 11/10/2014  . Dyspnea and respiratory abnormality 10/19/2014  . Aortic valve stenosis 05/27/2014  . CAD (coronary artery disease) of artery bypass graft 11/30/2013  . CAD (coronary artery disease) 11/30/2013  . Hyperlipidemia with target LDL less than 70 11/30/2013  . Essential hypertension 11/30/2013  . DM2 (diabetes mellitus, type 2) ([place]) 11/30/2013  . RBBB 11/30/2013   Past Medical History:  Past Medical History  Diagnosis Date  . Hypertension   . High cholesterol   . Prostate cancer ([place])   . CAD (coronary artery disease)   . OSA (obstructive sleep apnea)   . COPD (chronic obstructive pulmonary disease) ([facility])     MODERATE  . Diabetes mellitus ([place])   . GERD (gastroesophageal reflux disease)   . Vertigo   . Arthritis     OA  . Anemia    Past Surgical History:  Past Surgical History  Procedure Laterality Date  . Coronary artery bypass graft  03/29/1999    x 4:  LIMA to the LAD,SVG to the diagonal branch of the LAD,SVG to the intermediate coronary artery & SVG to the posterior descending branch of the RCA.  . Colon surgery      x 4  . Nm myocar perf wall motion  04/28/2012    Low Risk Scan  . Prostate surgery    . Cardiac  catheterization    . Cardiac catheterization N/A 12/12/2015    Procedure: Right/Left Heart Cath and Coronary/Graft Angiography;  Surgeon: Troy Sine, MD;  Location: [facility];  Service: Cardiovascular;  Laterality: N/A;  . Peripheral vascular catheterization N/A 12/12/2015    Procedure: Aortic Arch Angiography;  Surgeon: Troy Sine, MD;  Location: [facility];  Service: Cardiovascular;  Laterality: N/A;  . Eye surgery     HPI:  81 y.o. male admitted for R side weakness resulting in 2 falls on 12/13/15. MRI (+) for L medulla infarct. Pt is s/p a cardiac cath performed on 12/12/15. PMH significant for HTN, DM, prostate cancer, CAD, CABG x4, dyspnea on exertion, OSA.     Assessment / Plan / Recommendation Clinical Impression  Cognitive-linguistic evaluation complete. Patient Douglas Carrillo for all areas with the exception of mild residual right sided facial weakness/droop resulting from CN VII impairment. Patient could certainly benefit from brief SLP f/u on CIR, otherwise, HH vs OP therapy per his discretion. Discussed with patient and family. No acute needs indicated at this time.     SLP Assessment  All further Speech Lanaguage Pathology  needs can be addressed in the next venue of care    Follow Up Recommendations  Inpatient Rehab          SLP Evaluation Prior Functioning  Cognitive/Linguistic Baseline: Within functional limits Type of Home: House Available Help at Discharge:  Family;Available 24 hours/day   Cognition  Overall Cognitive Status: Within Functional Limits for tasks assessed    Comprehension  Auditory Comprehension Overall Auditory Comprehension: Appears within functional limits for tasks assessed Visual Recognition/Discrimination Discrimination: Within Function Limits Reading Comprehension Reading Status: Within funtional limits    Expression Expression Primary Mode of Expression: Verbal Verbal Expression Overall Verbal Expression: Appears within  functional limits for tasks assessed Written Expression Dominant Hand: Right   Oral / Motor Oral Motor/Sensory Function Overall Oral Motor/Sensory Function: Mild impairment Facial ROM: Reduced right;Suspected CN VII (facial) dysfunction Facial Symmetry: Abnormal symmetry right;Suspected CN VII (facial) dysfunction Facial Strength: Within Functional Limits Facial Sensation: Within Functional Limits Lingual ROM: Within Functional Limits Lingual Symmetry: Within Functional Limits Lingual Strength: Within Functional Limits Lingual Sensation: Within Functional Limits Velum: Within Functional Limits Mandible: Within Functional Limits Motor Speech Overall Motor Speech: Appears within functional limits for tasks assessed   Gabriel Rainwater [place], [place] [phone]  Gabriel Rainwater [place] 12/14/2015, 12:52 PM

## 2015-12-14 NOTE — Progress Notes (Addendum)
Patient Demographics  Douglas Carrillo, is a 81 y.o. male, DOB - 15-Apr-1928, [id number]  Admit date - 12/13/2015   Admitting Physician Waldemar Dickens, MD  Outpatient Primary MD for the patient is Limmie Patricia, MD  LOS - 1   Chief Complaint  Patient presents with  . Extremity Weakness       Admission HPI/Brief narrative: 81 y.o. male with past medical history of HTN, HLP, DM, CABG , presents with right-sided weakness, MRI significant for acute CVA, patient had cardiac cath one day prior to admission, The grafts were patent 16 years post revascularization. There as progressive aortic valve stenosis and was moderately severe LV dysfunction with global hypokinesis and an ejection fraction of 30-35%. Patient was referred to Cardiothoracic surgery for consideration of AVR / TAVR  Subjective:   Douglas Carrillo today has, No headache, No chest pain, No abdominal pain - No Nausea,No Cough - SOB, reports no change in right-sided weakness.  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 - Patient presents with mild right upper extremity hemiparesis, more severe right lower extremity hemiparesis, -  MRI of the brain showing left medullary infarcts, most likely secondary to small vessel disease,  - MRA head and neck with no current acute stenosis,  - 2-D echo done on 11/24/2015 with no source of embolus, -  LDL is 53, patient is already on Crestor and Zetia, - Continue with aspirin and Plavix - PT/OT evaluation  pending  Hypertension - Allow for permissive hypertension, continue to hold amlodipine, Imdur, Hyzaar, and metoprolol.  Hyperlipidemia - LDL is 53, patient is on Crestor and Zetia  Obstructive sleep apnea - On home oxygen, C Pap not recommended  Coronary artery disease, status post CABG - Inject catheter done on 12/20, with patent grafts - Continue with aspirin, Plavix, statin, holding Hyzaar and beta blocker and Imdur secondary to CVA  Diabetes mellitus - Hold oral hypoglycemic agent, continue with insulin sliding scale, follow-hemoglobin A1c  Elevated troponin - Likely due to acute CVA, recent cardiac cath with patent graft  Aortic valve stenosis, moderately severe. -To see Dr. Cyndia Bent for evaluation of AVR candidacy with possibility of TAVR.  Anemia - Hemoglobin 8.7 today, baseline is 10, this is most likely delusional due to IV fluid after cardiac cath, monitor closely.  Code Status: Partial  Family Communication: Discussed with patient, none at bedside  Disposition Plan: Pending PT evaluation   Procedures  None   Consults   Neurology   Medications  Scheduled Meds: . clopidogrel  75 mg Oral Daily  . cycloSPORINE  1 drop Both Eyes BID  . enoxaparin (LOVENOX) injection  40 mg Subcutaneous Q24H  . furosemide  40 mg Oral Daily  . insulin aspart  0-9 Units Subcutaneous TID WC  . latanoprost  1 drop Both Eyes QHS  .  Liraglutide  1.8 mg Injection [id number]  . oxybutynin  5 mg Oral Daily  . pantoprazole  40 mg Oral Daily  . potassium chloride SA  60 mEq Oral Daily  . rosuvastatin  20 mg Oral Daily  . sodium chloride  3 mL Intravenous Q12H  . tiotropium  18 mcg Inhalation Daily   Continuous Infusions:  PRN Meds:.sodium chloride, acetaminophen **OR** acetaminophen, albuterol, HYDROcodone-acetaminophen, ondansetron **OR** ondansetron (ZOFRAN) IV, senna-docusate, sodium chloride  DVT Prophylaxis  Lovenox   Lab Results  Component Value Date   PLT 198 12/14/2015     Antibiotics    Anti-infectives    None          Objective:   Filed Vitals:   12/13/15 2200 12/14/15 0000 12/14/15 0153 12/14/15 0518  BP: 142/59 130/48 142/44 151/59  Pulse: 80 82 81 91  Temp: 98.4 F (36.9 C) 98.6 F (37 C) 98.7 F (37.1 C) 98.8 F (37.1 C)  TempSrc: Oral Oral Oral Oral  Resp: 18 18 18 18   Height:      Weight:      SpO2: 97% 96% 98% 96%    Wt Readings from Last 3 Encounters:  12/13/15 92.987 kg (205 lb)  12/12/15 92.987 kg (205 lb)  12/11/15 94.348 kg (208 lb)     Intake/Output Summary (Last 24 hours) at 12/14/15 1024 Last data filed at 12/14/15 0648  Gross per 24 hour  Intake      0 ml  Output    650 ml  Net   -650 ml     Physical Exam  Awake Alert, Oriented X 3, No new  Normal affect [place].AT,PERRAL Supple Neck,No JVD.  Symmetrical Chest wall movement, Good air movement bilaterally, CTAB RRR,No Gallops,Rubs or new Murmurs, No Parasternal Heave +ve B.Sounds, Abd Soft, No tenderness, No organomegaly appriciated,  No Cyanosis, Clubbing or edema, No new Rash or bruise , right: Bruising at site of previous cath, right-sided weakness+.   Data Review   Micro Results No results found for this or any previous visit (from the past 240 hour(s)).  Radiology Reports Dg Chest 2 View  12/11/2015  CLINICAL DATA:  History of coronary artery disease, pre catheterization examination, history of prior tobacco use, COPD. EXAM: CHEST  2 VIEW COMPARISON:  Chest x-ray of September 25th first 2015 and CT scan of the chest of October 24, 2014. FINDINGS: The lungs remain hyperinflated. There is no focal infiltrate. There is no pleural effusion. The cardiac silhouette is mildly enlarged. The central pulmonary vascularity is prominent but there is no cephalization of the vascular pattern. There are 8 intact sternal wires. The patient has undergone previous CABG. The bony thorax exhibits no acute abnormality. IMPRESSION: COPD with superimposed cardiomegaly and  mild central pulmonary vascular prominence. These findings are stable. There is no evidence of pneumonia nor pulmonary edema. Electronically Signed   By: David  Martinique M.D.   On: 12/11/2015 10:51   Ct Head Wo Contrast  12/13/2015  CLINICAL DATA:  Recent fall EXAM: CT HEAD WITHOUT CONTRAST TECHNIQUE: Contiguous axial images were obtained from the base of the skull through the vertex without intravenous contrast. COMPARISON:  None. FINDINGS: Bony calvarium is intact. Mild atrophic changes are noted commenced with the patient's given age. No findings to suggest acute hemorrhage, acute infarction or space-occupying mass lesion are identified. IMPRESSION: No acute abnormality noted. Electronically Signed   By: Inez Catalina M.D.   On: 12/13/2015 08:20   Mr Jodene Nam Head Wo Contrast  12/13/2015  CLINICAL DATA:  Cardiac catheterization yesterday. Awoke with right-sided arm and leg weakness. EXAM: MRI HEAD WITHOUT AND WITH CONTRAST MRA HEAD WITHOUT CONTRAST MRA NECK WITHOUT AND WITH CONTRAST TECHNIQUE: Multiplanar, multiecho pulse sequences of the brain and surrounding structures were obtained without and with intravenous contrast. Angiographic images of the Circle of Willis were obtained using MRA technique without intravenous contrast. Angiographic images of the neck were obtained using MRA technique without and with intravenous contrast. Carotid stenosis measurements (when applicable) are obtained utilizing NASCET criteria, using the distal internal carotid diameter as the denominator. CONTRAST:  50mL MULTIHANCE GADOBENATE DIMEGLUMINE 529 MG/ML IV SOLN COMPARISON:  CT same day FINDINGS: MRI HEAD FINDINGS Diffusion imaging shows acute infarction within the ventral surface of the left medulla. No other acute infarction. Elsewhere the brainstem and cerebellum are normal. The cerebral hemispheres show mild chronic small-vessel ischemic changes of the white matter, fairly typical for age. Small focus of hemosiderin  deposition in the right parietal deep white matter. No cortical or large vessel territory infarction. No mass lesion, acute hemorrhage, hydrocephalus or extra-axial collection. No pituitary mass. No inflammatory sinus disease. Small mastoid effusions bilaterally. After contrast administration, no abnormal enhancement of the brain occurs. MRA HEAD FINDINGS Both internal carotid arteries are widely patent into the brain. The anterior and middle cerebral vessels are patent without proximal stenosis, aneurysm or vascular malformation. Both vertebral arteries are patent with the right being dominant. No basilar stenosis. Anterior inferior cerebellar arteries, superior cerebellar arteries and posterior cerebral arteries appear patent and normal. Posterior inferior cerebellar arteries not demonstrated, possibly congenitally hypoplastic or aplastic. MRA NECK FINDINGS Branching pattern of the brachiocephalic vessels from the arch is normal. No origin stenoses. Both common carotid arteries widely patent to their respective bifurcation. Mild atherosclerotic disease at both carotid bifurcations but no stenosis of the internal carotid artery on either side. 50% stenosis of the external carotid artery on the right. Vertebral artery origins are not well seen because of physiologic motion in the chest. I think the left vertebral artery arises from the arch. In the neck, both vertebral arteries are widely patent without focal stenosis. IMPRESSION: Acute infarction along the ventral surface of the left medulla. No hemorrhage or swelling. Mild chronic small-vessel ischemic changes elsewhere affecting the brain, fairly typical for age. No carotid bifurcation stenosis other than 50% narrowing of the external carotid artery on the right. Vertebral artery origins can't be accurately evaluated because of physiologic motion. Small left vertebral artery arises directly from the arch. Vertebral arteries both widely patent in the neck to the  basilar. Electronically Signed   By: Nelson Chimes M.D.   On: 12/13/2015 15:54   Mr Angiogram Neck W Wo Contrast  12/13/2015  CLINICAL DATA:  Cardiac catheterization yesterday. Awoke with right-sided arm and leg weakness. EXAM: MRI HEAD WITHOUT AND WITH CONTRAST MRA HEAD WITHOUT CONTRAST MRA NECK WITHOUT AND WITH CONTRAST TECHNIQUE: Multiplanar, multiecho pulse sequences of the brain and surrounding structures were obtained without and with intravenous contrast. Angiographic images of the Circle of Willis were obtained using MRA technique without intravenous contrast. Angiographic images of the neck were obtained using MRA technique without and with intravenous contrast. Carotid stenosis measurements (when applicable) are obtained utilizing NASCET criteria, using the distal internal carotid diameter as the denominator. CONTRAST:  82mL MULTIHANCE GADOBENATE DIMEGLUMINE 529 MG/ML IV SOLN COMPARISON:  CT same day FINDINGS: MRI HEAD FINDINGS Diffusion imaging shows acute infarction within the ventral surface of the left medulla. No other acute infarction. Elsewhere  the brainstem and cerebellum are normal. The cerebral hemispheres show mild chronic small-vessel ischemic changes of the white matter, fairly typical for age. Small focus of hemosiderin deposition in the right parietal deep white matter. No cortical or large vessel territory infarction. No mass lesion, acute hemorrhage, hydrocephalus or extra-axial collection. No pituitary mass. No inflammatory sinus disease. Small mastoid effusions bilaterally. After contrast administration, no abnormal enhancement of the brain occurs. MRA HEAD FINDINGS Both internal carotid arteries are widely patent into the brain. The anterior and middle cerebral vessels are patent without proximal stenosis, aneurysm or vascular malformation. Both vertebral arteries are patent with the right being dominant. No basilar stenosis. Anterior inferior cerebellar arteries, superior cerebellar  arteries and posterior cerebral arteries appear patent and normal. Posterior inferior cerebellar arteries not demonstrated, possibly congenitally hypoplastic or aplastic. MRA NECK FINDINGS Branching pattern of the brachiocephalic vessels from the arch is normal. No origin stenoses. Both common carotid arteries widely patent to their respective bifurcation. Mild atherosclerotic disease at both carotid bifurcations but no stenosis of the internal carotid artery on either side. 50% stenosis of the external carotid artery on the right. Vertebral artery origins are not well seen because of physiologic motion in the chest. I think the left vertebral artery arises from the arch. In the neck, both vertebral arteries are widely patent without focal stenosis. IMPRESSION: Acute infarction along the ventral surface of the left medulla. No hemorrhage or swelling. Mild chronic small-vessel ischemic changes elsewhere affecting the brain, fairly typical for age. No carotid bifurcation stenosis other than 50% narrowing of the external carotid artery on the right. Vertebral artery origins can't be accurately evaluated because of physiologic motion. Small left vertebral artery arises directly from the arch. Vertebral arteries both widely patent in the neck to the basilar. Electronically Signed   By: Nelson Chimes M.D.   On: 12/13/2015 15:54   Mr Jeri Cos [id number] Contrast  12/13/2015  CLINICAL DATA:  Cardiac catheterization yesterday. Awoke with right-sided arm and leg weakness. EXAM: MRI HEAD WITHOUT AND WITH CONTRAST MRA HEAD WITHOUT CONTRAST MRA NECK WITHOUT AND WITH CONTRAST TECHNIQUE: Multiplanar, multiecho pulse sequences of the brain and surrounding structures were obtained without and with intravenous contrast. Angiographic images of the Circle of Willis were obtained using MRA technique without intravenous contrast. Angiographic images of the neck were obtained using MRA technique without and with intravenous contrast. Carotid  stenosis measurements (when applicable) are obtained utilizing NASCET criteria, using the distal internal carotid diameter as the denominator. CONTRAST:  55mL MULTIHANCE GADOBENATE DIMEGLUMINE 529 MG/ML IV SOLN COMPARISON:  CT same day FINDINGS: MRI HEAD FINDINGS Diffusion imaging shows acute infarction within the ventral surface of the left medulla. No other acute infarction. Elsewhere the brainstem and cerebellum are normal. The cerebral hemispheres show mild chronic small-vessel ischemic changes of the white matter, fairly typical for age. Small focus of hemosiderin deposition in the right parietal deep white matter. No cortical or large vessel territory infarction. No mass lesion, acute hemorrhage, hydrocephalus or extra-axial collection. No pituitary mass. No inflammatory sinus disease. Small mastoid effusions bilaterally. After contrast administration, no abnormal enhancement of the brain occurs. MRA HEAD FINDINGS Both internal carotid arteries are widely patent into the brain. The anterior and middle cerebral vessels are patent without proximal stenosis, aneurysm or vascular malformation. Both vertebral arteries are patent with the right being dominant. No basilar stenosis. Anterior inferior cerebellar arteries, superior cerebellar arteries and posterior cerebral arteries appear patent and normal. Posterior inferior cerebellar arteries not demonstrated, possibly  congenitally hypoplastic or aplastic. MRA NECK FINDINGS Branching pattern of the brachiocephalic vessels from the arch is normal. No origin stenoses. Both common carotid arteries widely patent to their respective bifurcation. Mild atherosclerotic disease at both carotid bifurcations but no stenosis of the internal carotid artery on either side. 50% stenosis of the external carotid artery on the right. Vertebral artery origins are not well seen because of physiologic motion in the chest. I think the left vertebral artery arises from the arch. In the  neck, both vertebral arteries are widely patent without focal stenosis. IMPRESSION: Acute infarction along the ventral surface of the left medulla. No hemorrhage or swelling. Mild chronic small-vessel ischemic changes elsewhere affecting the brain, fairly typical for age. No carotid bifurcation stenosis other than 50% narrowing of the external carotid artery on the right. Vertebral artery origins can't be accurately evaluated because of physiologic motion. Small left vertebral artery arises directly from the arch. Vertebral arteries both widely patent in the neck to the basilar. Electronically Signed   By: Nelson Chimes M.D.   On: 12/13/2015 15:54   Dg Chest Port 1 View  12/13/2015  CLINICAL DATA:  Right leg weakness today. Cardiac catheterization yesterday. EXAM: PORTABLE CHEST 1 VIEW COMPARISON:  Chest CT 10/24/2014.  Radiographs 12/11/2015. FINDINGS: Stable cardiomegaly status post CABG. There is mildly increased vascular congestion without overt pulmonary edema. There is no confluent airspace opacity, pneumothorax or significant pleural effusion. The bones appear unchanged. IMPRESSION: Mildly increased vascular congestion without overt pulmonary edema or confluent airspace opacity. Stable cardiomegaly. Electronically Signed   By: Richardean Sale M.D.   On: 12/13/2015 07:59     CBC  Recent Labs Lab 12/11/15 0958 12/13/15 0735 12/14/15 0354  WBC 5.4 6.5 5.7  HGB 10.1* 9.4* 8.7*  HCT 32.5* 29.6* 27.7*  PLT 236 196 198  MCV 90.8 90.8 89.9  MCH 28.2 28.8 28.2  MCHC 31.1 31.8 31.4  RDW 17.9* 18.5* 18.5*  LYMPHSABS  --  1.4  --   MONOABS  --  0.5  --   EOSABS  --  0.0  --   BASOSABS  --  0.0  --     Chemistries   Recent Labs Lab 12/11/15 0958 12/13/15 0735 12/14/15 0354  NA 140 140 142  K 4.1 3.5 3.6  CL 105 109 110  CO2 25 22 23   GLUCOSE 80 188* 151*  BUN 17 22* 16  CREATININE 1.36* 1.40* 1.10  CALCIUM 9.0 9.5 8.9  AST  --  24  --   ALT  --  16*  --   ALKPHOS  --  58  --    BILITOT  --  0.6  --    ------------------------------------------------------------------------------------------------------------------ estimated creatinine clearance is 53.9 mL/min (by C-G formula based on Cr of 1.1). ------------------------------------------------------------------------------------------------------------------ No results for input(s): HGBA1C in the last 72 hours. ------------------------------------------------------------------------------------------------------------------  Recent Labs  12/14/15 0354  CHOL 105  HDL 37*  LDLCALC 53  TRIG 76  CHOLHDL 2.8   ------------------------------------------------------------------------------------------------------------------ No results for input(s): TSH, T4TOTAL, T3FREE, THYROIDAB in the last 72 hours.  Invalid input(s): FREET3 ------------------------------------------------------------------------------------------------------------------ No results for input(s): VITAMINB12, FOLATE, FERRITIN, TIBC, IRON, RETICCTPCT in the last 72 hours.  Coagulation profile  Recent Labs Lab 12/11/15 0958 12/13/15 0735  INR 1.09 1.18    No results for input(s): DDIMER in the last 72 hours.  Cardiac Enzymes  Recent Labs Lab 12/13/15 1100 12/13/15 1614 12/13/15 2223  TROPONINI 0.37* 0.37* 0.33*   ------------------------------------------------------------------------------------------------------------------ Invalid input(s): POCBNP  Time Spent in minutes   25 minutes   Onell Mcmath M.D on 12/14/2015 at 10:24 AM  Between 7am to 7pm - Pager - [phone]  After 7pm go to [email or web address] - password [facility]  [facility]   Office  [phone]

## 2015-12-14 NOTE — Progress Notes (Signed)
STROKE TEAM PROGRESS NOTE   HISTORY Douglas Carrillo is an 81 y.o. male with hx of HTN, HLP, DM, CABG 16 years ago on plavix who has been having SOB on exertion who had an outpatient cardiac cath yesterday. Went home at his baseline but woke up this am (LKW 12/13/2015, time unknown) with R sided arm and leg weakness. Modified Rankin: Rankin Score=0. Patient was not administered TPA secondary to unknown time last known well. He was admitted for further evaluation and treatment.   SUBJECTIVE (INTERVAL HISTORY) His daughter and granddaughter are at the bedside.  Overall he feels his condition is stable. He feels he is back to baseline. He is a good historian. He has not been OOB since arrival.   OBJECTIVE Temp:  [98.2 F (36.8 C)-98.8 F (37.1 C)] 98.8 F (37.1 C) (12/22 0518) Pulse Rate:  [55-91] 91 (12/22 0518) Cardiac Rhythm:  [-] Normal sinus rhythm (12/21 1900) Resp:  [18-21] 18 (12/22 0518) BP: (130-163)/(44-78) 151/59 mmHg (12/22 0518) SpO2:  [91 %-98 %] 96 % (12/22 0518) Weight:  [92.987 kg (205 lb)] 92.987 kg (205 lb) (12/21 1158)  CBC:   Recent Labs Lab 12/13/15 0735 12/14/15 0354  WBC 6.5 5.7  NEUTROABS 4.5  --   HGB 9.4* 8.7*  HCT 29.6* 27.7*  MCV 90.8 89.9  PLT 196 [id number]    Basic Metabolic Panel:   Recent Labs Lab 12/13/15 0735 12/14/15 0354  NA 140 142  K 3.5 3.6  CL 109 110  CO2 22 23  GLUCOSE 188* 151*  BUN 22* 16  CREATININE 1.40* 1.10  CALCIUM 9.5 8.9    Lipid Panel:     Component Value Date/Time   CHOL 105 12/14/2015 0354   TRIG 76 12/14/2015 0354   HDL 37* 12/14/2015 0354   CHOLHDL 2.8 12/14/2015 0354   VLDL 15 12/14/2015 0354   LDLCALC 53 12/14/2015 0354   HgbA1c: No results found for: HGBA1C Urine Drug Screen: No results found for: LABOPIA, COCAINSCRNUR, LABBENZ, AMPHETMU, THCU, LABBARB    IMAGING  Ct Head Wo Contrast 12/13/2015   No acute abnormality noted.   Mr Jeri Cos Wo Contrast 12/13/2015   Acute infarction along the ventral  surface of the left medulla. No hemorrhage or swelling. Mild chronic small-vessel ischemic changes elsewhere affecting the brain, fairly typical for age.   Mr Angiogram Head & Neck W Wo Contrast 12/13/2015  No carotid bifurcation stenosis other than 50% narrowing of the external carotid artery on the right. Vertebral artery origins can't be accurately evaluated because of physiologic motion. Small left vertebral artery arises directly from the arch. Vertebral arteries both widely patent in the neck to the basilar.   Dg Chest Port 1 View 12/13/2015  Mildly increased vascular congestion without overt pulmonary edema or confluent airspace opacity. Stable cardiomegaly.   2D Echocardiogram  - Left ventricle: The cavity size was normal. Posterior wall  thickness was mildly increased. Systolic function was normal. The estimated ejection fraction was in the range of 50% to 55%. Wall motion was normal; there were no regional wall motionabnormalities. Features are consistent with a pseudonormal leftventricular filling pattern, with concomitant abnormal relaxationand increased filling pressure (grade 2 diastolic dysfunction).Doppler parameters are consistent with high ventricular fillingpressure. - Aortic valve: Valve mobility was severely restricted. There wassevere stenosis. There was mild to moderate regurgitation. Valvearea (VTI): 0.67 cm^2. Valve area (Vmax): 0.65 cm^2. Valve area(Vmean): 0.61 cm^2. - Mitral valve: Severely calcified annulus. The findings areconsistent with mild stenosis. Valve area by continuity  equation(using LVOT flow): 1.47 cm^2. - Left atrium: The atrium was moderately dilated. - Right ventricle: Systolic function was mildly reduced. - Right atrium: The atrium was mildly dilated. - Inferior vena cava: The vessel was normal in size. Therespirophasic diameter changes were in the normal range (>= 50%),consistent with normal central venous pressure.   PHYSICAL EXAM Pleasant  elderly Caucasian male not in distress. . Afebrile. Head is nontraumatic. Neck is supple without bruit.    Cardiac exam no murmur or gallop. Lungs are clear to auscultation. Distal pulses are well felt. Neurological Exam : Awake alert oriented 3 with normal speech and language function. Extraocular movements are full range without nystagmus. Fundi were not visualized. Pupils are equal reactive. Vision acuity and fields seem adequate. Mild right lower facial asymmetry when he smiles. Tongue midline. Motor system exam no upper or lower extremity drift but mild weakness of the right grip and intrinsic hand muscles. Orbits left over right upper extremity. Very minimal weakness of right hip flexors. Deep tendon reflexes are 2+ symmetric except ankle jerks are depressed. Slight diminished touch pinprick sensation in both feet from ankle down. Gait was not tested plantars are both downgoing ASSESSMENT/PLAN [name] is a 81 y.o. male with history of hypertension, hyperlipidemia, diabetes, CAD s/p CABG presenting with right arm and leg weakness s/p cardiac cath day prior. He did not receive IV t-PA due to unknown time last known well.   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   Resultant  Mild RUE hemiparesis, more severe RLE hemiparesis  MRI  Left medullary infarct. small vessel disease   MRA head and neck no carotid stenosis  2D Echo 11/24/2015 No source of embolus   LDL 53  HgbA1c pending  Lovenox 40 mg sq daily for VTE prophylaxis Diet heart healthy/carb modified Room service appropriate?: Yes; Fluid consistency:: Thin  aspirin 81 mg daily and clopidogrel 75 mg daily prior to admission, now on clopidogrel 75 mg daily. From stroke standpoint, no indication for dual antiplatelets, however, given current cardiac status, recommend  continuation of both.   Patient counseled to be compliant with his antithrombotic medications  Ongoing  aggressive stroke risk factor management  Therapy recommendations:  pending   Disposition:  pending   Hypertension  Stable  Hyperlipidemia  Home meds:  Crestor 20 and Zetia 10 with crestor resumed in hospital  LDL 53, goal < 70  Continue statin at discharge  Diabetes  HgbA1c pending, goal < 7.0  Other Stroke Risk Factors  Advanced age  Former Cigarette smoker, quit smoking 31 years ago   ETOH use  Coronary artery disease s/p CABG 16 yrs ago. Yesterday with grafts patent, global hypokinesis and an EF 30-35%  Obstructive sleep apnea, on O2 alone at home. CPAP not recommended  AORTIC VALVE STENOSIS, MODERATELY SEVERE  Other Active Problems  Elevated troponin at 0.47  Prolonged QT interval  COPD  Acute kidney injury  Normocytic anemia, chronic. Baseline hemoglobin 10.1;  8.7 today.  Hospital day # [place] for Pager information 12/14/2015 9:39 AM   I have personally examined this patient, reviewed notes, independently viewed imaging studies, participated in medical decision making and plan of care. I have made any additions or clarifications directly to the above note. Agree with note above. Patient presented with weakness and numbness following recent cardiac cath and MRI shows a left lower brainstem infarct likely due to embolization during the  procedure. He remains at risk for neurological worsening, recurrent stroke, TIA needs ongoing stroke evaluation and aggressive risk factor control.  Antony Contras, MD Medical Director [facility] Pager: [phone] 12/14/2015 2:22 PM   To contact Stroke Continuity provider, please refer to [email or web address] After hours, contact General Neurology

## 2015-12-14 NOTE — Consult Note (Signed)
Physical Medicine and Rehabilitation Consult  Reason for Consult: Right sided weakness and right facial weakness.  Referring Physician: Dr. Landis Gandy.    HPI: Douglas Carrillo is a 81 y.o. right handed male with history of HTN, DM type2, CABG,  OSA--oxygen at nights, severe aortic stenosis with recent onset of SOB and cardiac cath 12/20 with transient right arm and leg weakness post procedure likely due to embolic infarct.  He was discharged to home but readmitted the next day on 12/21 am with fall out of bed due to right leg weakness and decreased sensation right half of his body.  MRI brain done revealing acute infarct along ventral surface of left medulla and MRA brain/head without significant stenosis.  Dr. Leonie Man consulted and felt that stroke due to sequela of cardiac cath and to continue Plavix for secondary stroke prevention. Carotid dopplers without significant ICA stenosis.   Patient with resultant mild right facial droop,  RLE weakness affecting balance and mobility as well as hypoxia with activity. CIR recommended for follow up therapy  Independent PTA but did get dyspneic at times with household ambulation and walking to mail box. Completed cardio-pulmonary rehab earlier this year.   Daughter lives next door and works partime but available 24/7 after next week.    Review of Systems  HENT: Negative for hearing loss.   Eyes: Negative for blurred vision and double vision.  Respiratory: Positive for shortness of breath (with activity). Negative for cough and wheezing.   Cardiovascular: Negative for chest pain and palpitations.  Gastrointestinal: Positive for heartburn. Negative for nausea, abdominal pain and constipation.  Genitourinary: Negative for dysuria.  Musculoskeletal: Positive for myalgias (right lower back due to falls X 2 prior to admission). Negative for back pain and neck pain.  Skin: Negative for itching and rash.  Neurological: Positive for sensory change, speech  change and focal weakness. Negative for dizziness and headaches.  Psychiatric/Behavioral: The patient is not nervous/anxious and does not have insomnia.   All other systems reviewed and are negative.     Past Medical History  Diagnosis Date  . Hypertension   . High cholesterol   . Prostate cancer ([place])   . CAD (coronary artery disease)   . OSA (obstructive sleep apnea)   . COPD (chronic obstructive pulmonary disease) ([facility])     MODERATE  . Diabetes mellitus ([place])   . GERD (gastroesophageal reflux disease)   . Vertigo   . Arthritis     OA  . Anemia     Past Surgical History  Procedure Laterality Date  . Coronary artery bypass graft  03/29/1999    x 4:  LIMA to the LAD,SVG to the diagonal branch of the LAD,SVG to the intermediate coronary artery & SVG to the posterior descending branch of the RCA.  . Colon surgery      x 4  . Nm myocar perf wall motion  04/28/2012    Low Risk Scan  . Prostate surgery    . Cardiac catheterization    . Cardiac catheterization N/A 12/12/2015    Procedure: Right/Left Heart Cath and Coronary/Graft Angiography;  Surgeon: Troy Sine, MD;  Location: [facility];  Service: Cardiovascular;  Laterality: N/A;  . Peripheral vascular catheterization N/A 12/12/2015    Procedure: Aortic Arch Angiography;  Surgeon: Troy Sine, MD;  Location: [facility];  Service: Cardiovascular;  Laterality: N/A;  . Eye surgery      Family History  Problem Relation Age  of Onset  . Kidney disease Brother   . Breast cancer Sister   . Rheum arthritis Mother     Social History:  Lives alone. 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 He reports that he quit smoking about 31 years ago. His smoking use included Cigarettes. He has a 30 pack-year smoking history. He has never used smokeless tobacco. He reports that he drinks alcohol 1-2 times a year. He reports that he does not use illicit  drugs.   Allergies  Allergen Reactions  . Mold Extract [Trichophyton] Anaphylaxis  . Lipitor [Atorvastatin] Other (See Comments)    Weakness in legs/myalgias    Medications Prior to Admission  Medication Sig Dispense Refill  . amLODipine (NORVASC) 5 MG tablet Take 1 tablet (5 mg total) by mouth daily. 90 tablet 3  . aspirin 81 MG tablet Take 81 mg by mouth daily.    . clopidogrel (PLAVIX) 75 MG tablet TAKE 1 TABLET DAILY 90 tablet 3  . isosorbide mononitrate (IMDUR) 60 MG 24 hr tablet TAKE 1 AND 1/2 TABLETS     DAILY 135 tablet 0  . ketotifen (THERA TEARS ALLERGY) 0.025 % ophthalmic solution Place 1 drop into both eyes 2 (two) times daily.     Marland Kitchen KLOR-CON M20 20 MEQ tablet TAKE 3 TABLETS DAILY 270 tablet 0  . losartan-hydrochlorothiazide (HYZAAR) 100-25 MG per tablet TAKE 1 TABLET DAILY 90 tablet 3  . meclizine (ANTIVERT) 25 MG tablet Take 25 mg by mouth as needed for dizziness.    . metoprolol succinate (TOPROL-XL) 100 MG 24 hr tablet TAKE 1 TABLET (100 MG)     EVERY MORNING AND TAKE 1/2 TABLET (50 MG) AT BEDTIME 135 tablet 0  . Tiotropium Bromide-Olodaterol (STIOLTO RESPIMAT) 2.5-2.5 MCG/ACT AERS Inhale 2 puffs into the lungs daily. 3 Inhaler 3  . b complex vitamins capsule Take 1 capsule by mouth daily.    . B-D ULTRAFINE III SHORT PEN 31G X 8 MM MISC     . benzonatate (TESSALON) 100 MG capsule Take 1 capsule by mouth 3 (three) times daily as needed for cough.   1  . CRESTOR 20 MG tablet Take 20 mg by mouth daily.     . furosemide (LASIX) 40 MG tablet TAKE 1 TABLET DAILY 90 tablet 0  . GLIPIZIDE XL 10 MG 24 hr tablet Take 10 mg by mouth daily.     Marland Kitchen latanoprost (XALATAN) 0.005 % ophthalmic solution Place 1 drop into both eyes at bedtime.    . metFORMIN (GLUCOPHAGE-XR) 500 MG 24 hr tablet Take 500 mg by mouth 2 (two) times daily.     . Multiple Vitamins-Minerals (CENTRUM SILVER ULTRA MENS PO) Take 1 tablet by mouth daily.    Marland Kitchen NEXIUM 40 MG capsule Take 40 mg by mouth daily.     Glory Rosebush DELICA LANCETS [id number] MISC 1 each by Other route 2 (two) times daily. Use 1 lancet each to check glucose bid    . ONETOUCH VERIO test strip 1 strip by Other route 2 (two) times daily. Use 1 strip to check glucose bid    . oxybutynin (DITROPAN-XL) 5 MG 24 hr tablet Take 5 mg by mouth daily.     . pioglitazone (ACTOS) 45 MG tablet Take 1 tablet by mouth daily.    Marland Kitchen PROAIR HFA 108 (90 BASE) MCG/ACT inhaler Inhale into the lungs as needed.    . RESTASIS 0.05 % ophthalmic emulsion Place  1 drop into both eyes 2 (two) times daily.    Marland Kitchen Spacer/Aero-Holding Chambers (AEROCHAMBER PLUS FLO-VU) MISC   1  . VICTOZA 18 MG/3ML SOPN Inject 1.8 mg as directed every morning.     . vitamin C (ASCORBIC ACID) 500 MG tablet Take 500 mg by mouth 2 (two) times daily.    . Vitamin D, Ergocalciferol, (DRISDOL) 50000 UNITS CAPS capsule Take 50,000 Units by mouth every 14 (fourteen) days. Every other week on Sunday    . vitamin E 400 UNIT capsule Take 400 Units by mouth daily.    Marland Kitchen ZETIA 10 MG tablet Take 10 mg by mouth daily.       Home: Home Living Family/patient expects to be discharged to:: Private residence Living Arrangements: Alone Available Help at Discharge: Family, Available 24 hours/day Type of Home: House Home Access: Stairs to enter [employer] of Steps: 2 Entrance Stairs-Rails: None Home Layout: One level Bathroom Shower/Tub: Tub/shower unit, Architectural technologist: Standard Home Equipment: Radio producer - single point Additional Comments: Daughter lives around the corner, is a Risk manager and pt states she can provide 24/7 care if needed.  Functional History: Prior Function Level of Independence: Independent Comments: Drives, goes to the gym everyday, and like watching sports (especially college basketball) Functional Status:  Mobility: Bed Mobility Overal bed mobility: Needs Assistance Bed Mobility: Supine to Sit Rolling: Min assist Sidelying to sit: Mod assist Supine to sit:  Mod assist Sit to supine: Mod assist General bed mobility comments: exit to Rt HOB flat, no rail; pt self-selected supine to sit; assist to raise torson Transfers Overall transfer level: Needs assistance Equipment used: 2 person hand held assist Transfers: Sit to/from Stand, Stand Pivot Transfers Sit to Stand: Mod assist, +2 physical assistance Stand pivot transfers: Mod assist, +2 physical assistance General transfer comment: Good weight bearing through RLE with weakness but not buckling; required assist to weight shift and advance RLE with pivot to his left Ambulation/Gait General Gait Details: pivotal steps only    ADL: ADL Overall ADL's : Needs assistance/impaired  Cognition: Cognition Overall Cognitive Status: Within Functional Limits for tasks assessed Orientation Level: Oriented X4 Cognition Arousal/Alertness: Awake/alert Behavior During Therapy: WFL for tasks assessed/performed Overall Cognitive Status: Within Functional Limits for tasks assessed   Blood pressure 145/49, pulse 76, temperature 98.6 F (37 C), temperature source Oral, resp. rate 20, height 6' (1.829 m), weight 92.987 kg (205 lb), SpO2 94 %. Physical Exam  Nursing note and vitals reviewed. Constitutional: He is oriented to person, place, and time. He appears well-developed and well-nourished.  Up in chair--appears younger than stated age.   HENT:  Head: Normocephalic and atraumatic.  Mouth/Throat: No oropharyngeal exudate.  Eyes: Conjunctivae and EOM are normal. Pupils are equal, round, and reactive to light. No scleral icterus.  Neck: Normal range of motion. Neck supple.  Cardiovascular: Normal rate and regular rhythm.   Holosystolic murmur.   Respiratory: Effort normal and breath sounds normal. No stridor. He exhibits no tenderness.  GI: Soft. Bowel sounds are normal. He exhibits no distension. There is no tenderness.  Musculoskeletal: He exhibits edema (right ankle with trace edema). He exhibits no  tenderness.  Neurological: He is alert and oriented to person, place, and time.  Right facial weakness with minimal dysarthria.  Able to follow one and two step motor commands without difficulty.  3+ DTRs RUE/RLE Sensation diminished to light touch RUE/RLE Motor: LUE/LLE: 5/5 RUE: 3+ -4-/5 proximal to distal RLE: hip flexion 2+/5, ankle dorsi/plantar flexion 2/5  Skin: Skin is warm and dry. No rash noted.  Tenderness with ecchymosis right  flank.  Psychiatric: He has a normal mood and affect. His behavior is normal. Judgment and thought content normal.    Results for orders placed or performed during the hospital encounter of 12/13/15 (from the past 24 hour(s))  Glucose, capillary     Status: Abnormal   Collection Time: 12/14/15 11:52 AM  Result Value Ref Range   Glucose-Capillary 200 (H) 65 - 99 mg/dL  Glucose, capillary     Status: Abnormal   Collection Time: 12/14/15  4:12 PM  Result Value Ref Range   Glucose-Capillary 135 (H) 65 - 99 mg/dL   Comment 1 Notify RN    Comment 2 Document in Chart   Glucose, capillary     Status: Abnormal   Collection Time: 12/14/15 10:10 PM  Result Value Ref Range   Glucose-Capillary 143 (H) 65 - 99 mg/dL   Comment 1 Notify RN    Comment 2 Document in Chart   CBC     Status: Abnormal   Collection Time: 12/15/15  4:10 AM  Result Value Ref Range   WBC 4.9 4.0 - 10.5 K/uL   RBC 3.02 (L) 4.22 - 5.81 MIL/uL   Hemoglobin 8.6 (L) 13.0 - 17.0 g/dL   HCT 27.3 (L) 39.0 - 52.0 %   MCV 90.4 78.0 - 100.0 fL   MCH 28.5 26.0 - 34.0 pg   MCHC 31.5 30.0 - 36.0 g/dL   RDW 18.6 (H) 11.5 - 15.5 %   Platelets 176 150 - 400 K/uL  Basic metabolic panel     Status: Abnormal   Collection Time: 12/15/15  4:10 AM  Result Value Ref Range   Sodium 141 135 - 145 mmol/L   Potassium 3.5 3.5 - 5.1 mmol/L   Chloride 109 101 - 111 mmol/L   CO2 24 22 - 32 mmol/L   Glucose, Bld 148 (H) 65 - 99 mg/dL   BUN 16 6 - 20 mg/dL   Creatinine, Ser 1.18 0.61 - 1.24 mg/dL    Calcium 8.7 (L) 8.9 - 10.3 mg/dL   GFR calc non Af Amer 54 (L) >60 mL/min   GFR calc Af Amer >60 >60 mL/min   Anion gap 8 5 - 15  Glucose, capillary     Status: Abnormal   Collection Time: 12/15/15  7:12 AM  Result Value Ref Range   Glucose-Capillary 157 (H) 65 - 99 mg/dL   Comment 1 Notify RN    Comment 2 Document in Chart    Mr Jodene Nam Head Wo Contrast  12/13/2015  CLINICAL DATA:  Cardiac catheterization yesterday. Awoke with right-sided arm and leg weakness. EXAM: MRI HEAD WITHOUT AND WITH CONTRAST MRA HEAD WITHOUT CONTRAST MRA NECK WITHOUT AND WITH CONTRAST TECHNIQUE: Multiplanar, multiecho pulse sequences of the brain and surrounding structures were obtained without and with intravenous contrast. Angiographic images of the Circle of Willis were obtained using MRA technique without intravenous contrast. Angiographic images of the neck were obtained using MRA technique without and with intravenous contrast. Carotid stenosis measurements (when applicable) are obtained utilizing NASCET criteria, using the distal internal carotid diameter as the denominator. CONTRAST:  80mL MULTIHANCE GADOBENATE DIMEGLUMINE 529 MG/ML IV SOLN COMPARISON:  CT same day FINDINGS: MRI HEAD FINDINGS Diffusion imaging shows acute infarction within the ventral surface of the left medulla. No other acute infarction. Elsewhere the brainstem and cerebellum are normal. The cerebral hemispheres show mild chronic small-vessel ischemic changes of the white  matter, fairly typical for age. Small focus of hemosiderin deposition in the right parietal deep white matter. No cortical or large vessel territory infarction. No mass lesion, acute hemorrhage, hydrocephalus or extra-axial collection. No pituitary mass. No inflammatory sinus disease. Small mastoid effusions bilaterally. After contrast administration, no abnormal enhancement of the brain occurs. MRA HEAD FINDINGS Both internal carotid arteries are widely patent into the brain. The  anterior and middle cerebral vessels are patent without proximal stenosis, aneurysm or vascular malformation. Both vertebral arteries are patent with the right being dominant. No basilar stenosis. Anterior inferior cerebellar arteries, superior cerebellar arteries and posterior cerebral arteries appear patent and normal. Posterior inferior cerebellar arteries not demonstrated, possibly congenitally hypoplastic or aplastic. MRA NECK FINDINGS Branching pattern of the brachiocephalic vessels from the arch is normal. No origin stenoses. Both common carotid arteries widely patent to their respective bifurcation. Mild atherosclerotic disease at both carotid bifurcations but no stenosis of the internal carotid artery on either side. 50% stenosis of the external carotid artery on the right. Vertebral artery origins are not well seen because of physiologic motion in the chest. I think the left vertebral artery arises from the arch. In the neck, both vertebral arteries are widely patent without focal stenosis. IMPRESSION: Acute infarction along the ventral surface of the left medulla. No hemorrhage or swelling. Mild chronic small-vessel ischemic changes elsewhere affecting the brain, fairly typical for age. No carotid bifurcation stenosis other than 50% narrowing of the external carotid artery on the right. Vertebral artery origins can't be accurately evaluated because of physiologic motion. Small left vertebral artery arises directly from the arch. Vertebral arteries both widely patent in the neck to the basilar. Electronically Signed   By: Nelson Chimes M.D.   On: 12/13/2015 15:54   Mr Angiogram Neck W Wo Contrast  12/13/2015  CLINICAL DATA:  Cardiac catheterization yesterday. Awoke with right-sided arm and leg weakness. EXAM: MRI HEAD WITHOUT AND WITH CONTRAST MRA HEAD WITHOUT CONTRAST MRA NECK WITHOUT AND WITH CONTRAST TECHNIQUE: Multiplanar, multiecho pulse sequences of the brain and surrounding structures were  obtained without and with intravenous contrast. Angiographic images of the Circle of Willis were obtained using MRA technique without intravenous contrast. Angiographic images of the neck were obtained using MRA technique without and with intravenous contrast. Carotid stenosis measurements (when applicable) are obtained utilizing NASCET criteria, using the distal internal carotid diameter as the denominator. CONTRAST:  40mL MULTIHANCE GADOBENATE DIMEGLUMINE 529 MG/ML IV SOLN COMPARISON:  CT same day FINDINGS: MRI HEAD FINDINGS Diffusion imaging shows acute infarction within the ventral surface of the left medulla. No other acute infarction. Elsewhere the brainstem and cerebellum are normal. The cerebral hemispheres show mild chronic small-vessel ischemic changes of the white matter, fairly typical for age. Small focus of hemosiderin deposition in the right parietal deep white matter. No cortical or large vessel territory infarction. No mass lesion, acute hemorrhage, hydrocephalus or extra-axial collection. No pituitary mass. No inflammatory sinus disease. Small mastoid effusions bilaterally. After contrast administration, no abnormal enhancement of the brain occurs. MRA HEAD FINDINGS Both internal carotid arteries are widely patent into the brain. The anterior and middle cerebral vessels are patent without proximal stenosis, aneurysm or vascular malformation. Both vertebral arteries are patent with the right being dominant. No basilar stenosis. Anterior inferior cerebellar arteries, superior cerebellar arteries and posterior cerebral arteries appear patent and normal. Posterior inferior cerebellar arteries not demonstrated, possibly congenitally hypoplastic or aplastic. MRA NECK FINDINGS Branching pattern of the brachiocephalic vessels from the arch is  normal. No origin stenoses. Both common carotid arteries widely patent to their respective bifurcation. Mild atherosclerotic disease at both carotid bifurcations but  no stenosis of the internal carotid artery on either side. 50% stenosis of the external carotid artery on the right. Vertebral artery origins are not well seen because of physiologic motion in the chest. I think the left vertebral artery arises from the arch. In the neck, both vertebral arteries are widely patent without focal stenosis. IMPRESSION: Acute infarction along the ventral surface of the left medulla. No hemorrhage or swelling. Mild chronic small-vessel ischemic changes elsewhere affecting the brain, fairly typical for age. No carotid bifurcation stenosis other than 50% narrowing of the external carotid artery on the right. Vertebral artery origins can't be accurately evaluated because of physiologic motion. Small left vertebral artery arises directly from the arch. Vertebral arteries both widely patent in the neck to the basilar. Electronically Signed   By: Nelson Chimes M.D.   On: 12/13/2015 15:54   Mr Jeri Cos [id number] Contrast  12/13/2015  CLINICAL DATA:  Cardiac catheterization yesterday. Awoke with right-sided arm and leg weakness. EXAM: MRI HEAD WITHOUT AND WITH CONTRAST MRA HEAD WITHOUT CONTRAST MRA NECK WITHOUT AND WITH CONTRAST TECHNIQUE: Multiplanar, multiecho pulse sequences of the brain and surrounding structures were obtained without and with intravenous contrast. Angiographic images of the Circle of Willis were obtained using MRA technique without intravenous contrast. Angiographic images of the neck were obtained using MRA technique without and with intravenous contrast. Carotid stenosis measurements (when applicable) are obtained utilizing NASCET criteria, using the distal internal carotid diameter as the denominator. CONTRAST:  34mL MULTIHANCE GADOBENATE DIMEGLUMINE 529 MG/ML IV SOLN COMPARISON:  CT same day FINDINGS: MRI HEAD FINDINGS Diffusion imaging shows acute infarction within the ventral surface of the left medulla. No other acute infarction. Elsewhere the brainstem and cerebellum are  normal. The cerebral hemispheres show mild chronic small-vessel ischemic changes of the white matter, fairly typical for age. Small focus of hemosiderin deposition in the right parietal deep white matter. No cortical or large vessel territory infarction. No mass lesion, acute hemorrhage, hydrocephalus or extra-axial collection. No pituitary mass. No inflammatory sinus disease. Small mastoid effusions bilaterally. After contrast administration, no abnormal enhancement of the brain occurs. MRA HEAD FINDINGS Both internal carotid arteries are widely patent into the brain. The anterior and middle cerebral vessels are patent without proximal stenosis, aneurysm or vascular malformation. Both vertebral arteries are patent with the right being dominant. No basilar stenosis. Anterior inferior cerebellar arteries, superior cerebellar arteries and posterior cerebral arteries appear patent and normal. Posterior inferior cerebellar arteries not demonstrated, possibly congenitally hypoplastic or aplastic. MRA NECK FINDINGS Branching pattern of the brachiocephalic vessels from the arch is normal. No origin stenoses. Both common carotid arteries widely patent to their respective bifurcation. Mild atherosclerotic disease at both carotid bifurcations but no stenosis of the internal carotid artery on either side. 50% stenosis of the external carotid artery on the right. Vertebral artery origins are not well seen because of physiologic motion in the chest. I think the left vertebral artery arises from the arch. In the neck, both vertebral arteries are widely patent without focal stenosis. IMPRESSION: Acute infarction along the ventral surface of the left medulla. No hemorrhage or swelling. Mild chronic small-vessel ischemic changes elsewhere affecting the brain, fairly typical for age. No carotid bifurcation stenosis other than 50% narrowing of the external carotid artery on the right. Vertebral artery origins can't be accurately  evaluated because of physiologic motion. Small  left vertebral artery arises directly from the arch. Vertebral arteries both widely patent in the neck to the basilar. Electronically Signed   By: Nelson Chimes M.D.   On: 12/13/2015 15:54    Assessment/Plan: Diagnosis:  Left medulla embolic infarct Labs and images independently reviewed.  Records reviewed and summated above. Stroke: Continue secondary stroke prophylaxis and Risk Factor Modification listed below:   Antiplatelet therapy Blood Pressure Management:  Continue current medication with prn's with permisive HTN per primary team Statin Agent Diabetes management Right sided hemiparesis: fit for orthotics to prevent contractures (PRAFO, etc) Motor recovery: Fluoxetine  1. Does the need for close, 24 hr/day medical supervision in concert with the patient's rehab needs make it unreasonable for this patient to be served in a less intensive setting? Yes  2. Co-Morbidities requiring supervision/potential complications: HTN (monitor and provide prns in accordance with increased physical exertion and pain), DM type2 (Monitor in accordance with exercise and adjust meds as necessary), CAD s/p recent CABG (Monitor in accordance with increased physical activity and avoid UE resistance excercises),  OSA--oxygen at nights (Evaluate signs of daytime somnolence), severe aortic stenosis with recent onset of SOB (see CAD), anemia (transfuse if necessary to ensure appropriate perfusion for increased activity tolerance) 3. Due to safety, disease management, medication administration and patient education, does the patient require 24 hr/day rehab nursing? Yes 4. Does the patient require coordinated care of a physician, rehab nurse, PT (1-2 hrs/day, 5 days/week) and OT (1-2 hrs/day, 5 days/week) to address physical and functional deficits in the context of the above medical diagnosis(es)? Yes Addressing deficits in the following areas: balance, endurance, locomotion,  strength, transferring, dressing, grooming, toileting and psychosocial support 5. Can the patient actively participate in an intensive therapy program of at least 3 hrs of therapy per day at least 5 days per week? Yes 6. The potential for patient to make measurable gains while on inpatient rehab is excellent 7. Anticipated functional outcomes upon discharge from inpatient rehab are modified independent and supervision  with PT, modified independent and supervision with OT, n/a with SLP. 8. Estimated rehab length of stay to reach the above functional goals is: 14-18 days. 9. Does the patient have adequate social supports and living environment to accommodate these discharge functional goals? Yes 10. Anticipated D/C setting: Home 11. Anticipated post D/C treatments: HH therapy and Home excercise program 12. Overall Rehab/Functional Prognosis: good  RECOMMENDATIONS: This patient's condition is appropriate for continued rehabilitative care in the following setting: CIR Patient has agreed to participate in recommended program. Yes Note that insurance prior authorization may be required for reimbursement for recommended care.  Comment: Rehab Admissions Coordinator to follow up.  Delice Lesch, MD 12/15/2015

## 2015-12-14 NOTE — Evaluation (Signed)
Physical Therapy Evaluation Patient Details Name: Douglas Carrillo MRN: [id number] DOB: [birth year] Today's Date: 12/14/2015   History of Present Illness  81 y.o. male admitted for R side weakness resulting in 2 falls on 12/13/15. MRI (+) for L medulla infarct. Pt is s/p a cardiac cath performed on 12/12/15. PMH significant for HTN, DM, prostate cancer, CAD, CABG x4, dyspnea on exertion, OSA.     Clinical Impression  Pt admitted with above diagnosis. Patient is an excellent candidate for continued therapies (highly motivated, cognitively intact). For consideration of Comprehensive Inpatient Rehab (CIR) will have to be identified as inpatient status. (Discussed with Dr. Johnette Abraham) Pt currently with functional limitations due to the deficits listed below (see PT Problem List).  Pt will benefit from skilled PT to increase their independence and safety with mobility to allow discharge to the venue listed below.       Follow Up Recommendations CIR (if remains observation, would recommend SNF)    Equipment Recommendations   (TBA)    Recommendations for Other Services Rehab consult     Precautions / Restrictions Precautions Precautions: Fall Restrictions Weight Bearing Restrictions: No      Mobility  Bed Mobility Overal bed mobility: Needs Assistance Bed Mobility: Supine to Sit  Supine to sit: Mod assist    General bed mobility comments: exit to Rt HOB flat, no rail; pt self-selected supine to sit; assist to raise torson  Transfers Overall transfer level: Needs assistance Equipment used: 2 person hand held assist Transfers: Sit to/from [facility] Sit to Stand: Mod assist;+2 physical assistance Stand pivot transfers: Mod assist;+2 physical assistance       General transfer comment: Good weight bearing through RLE with weakness but not buckling; required assist to weight shift and advance RLE with pivot to his left  Ambulation/Gait             General Gait  Details: pivotal steps only  Stairs            Wheelchair Mobility    Modified Rankin (Stroke Patients Only) Modified Rankin (Stroke Patients Only) Pre-Morbid Rankin Score: No symptoms Modified Rankin: Moderately severe disability     Balance Overall balance assessment: Needs assistance Sitting-balance support: Feet supported;Single extremity supported Sitting balance-Leahy Scale: Zero Sitting balance - Comments: varied min to max assist to maintain sitting balance. R lateral and posterior lean Postural control: Posterior lean;Right lateral lean Standing balance support: Bilateral upper extremity supported Standing balance-Leahy Scale: Poor Standing balance comment: hyperextends RLE                             Pertinent Vitals/Pain Sitting on 2L with SaO2 82%, HR97 irreg (HR on sat monitor correlated with HR via telemetry). Increased to 3L O2 and slow incr to 89%. Educated on pursed lip breathing and increased to 92%. After transfer to chair, 92% on 2L HR 107   Pain Assessment: No/denies pain     Home Living Family/patient expects to be discharged to:: Private residence Living Arrangements: Alone Available Help at Discharge: Family;Available 24 hours/day Type of Home: House Home Access: Stairs to enter Entrance Stairs-Rails: None Entrance Stairs-Number of Steps: 2 Home Layout: One level Home Equipment: Cane - single point Additional Comments: Daughter lives around the corner, is a Risk manager and pt states she can provide 24/7 care if needed.    Prior Function Level of Independence: Independent         Comments: Drives, goes to  the gym everyday, and like watching sports (especially college basketball)     Hand Dominance   Dominant Hand: Right    Extremity/Trunk Assessment   Upper Extremity Assessment: Defer to OT evaluation        Lower Extremity Assessment: RLE deficits/detail RLE Deficits / Details: hip flexion 2+, knee extension  2+, DF 0, toe extension 2+    Cervical / Trunk Assessment: Kyphotic  Communication   Communication: No difficulties  Cognition Arousal/Alertness: Awake/alert Behavior During Therapy: WFL for tasks assessed/performed Overall Cognitive Status: Within Functional Limits for tasks assessed                      General Comments General comments (skin integrity, edema, etc.): Daughter and granddaughter present at end of session    Exercises        Assessment/Plan    PT Assessment Patient needs continued PT services  PT Diagnosis Hemiplegia dominant side   PT Problem List Decreased strength;Decreased activity tolerance;Decreased balance;Decreased mobility;Decreased knowledge of use of DME;Cardiopulmonary status limiting activity;Impaired sensation;Impaired tone  PT Treatment Interventions DME instruction;Gait training;Functional mobility training;Therapeutic activities;Balance training;Neuromuscular re-education;Patient/family education   PT Goals (Current goals can be found in the Care Plan section) Acute Rehab PT Goals Patient Stated Goal: to regain walking; back to gym PT Goal Formulation: With patient Time For Goal Achievement: 12/21/15 Potential to Achieve Goals: Good    Frequency Min 4X/week   Barriers to discharge        Co-evaluation               End of Session Equipment Utilized During Treatment: Gait belt;Oxygen Activity Tolerance: Patient limited by fatigue Patient left: in chair;with call bell/phone within reach;with chair alarm set;with family/visitor present Nurse Communication: Mobility status    Functional Assessment Tool Used: clinical judgment Functional Limitation: Mobility: Walking and moving around Mobility: Walking and Moving Around Current Status [id number]): At least 60 percent but less than 80 percent impaired, limited or restricted Mobility: Walking and Moving Around Goal Status [phone]): At least 1 percent but less than 20 percent  impaired, limited or restricted    Time: 1007-1032 PT Time Calculation (min) (ACUTE ONLY): 25 min   Charges:   PT Evaluation $Initial PT Evaluation Tier I: 1 Procedure PT Treatments $Therapeutic Activity: 8-22 mins   PT G Codes:   PT G-Codes **NOT FOR INPATIENT CLASS** Functional Assessment Tool Used: clinical judgment Functional Limitation: Mobility: Walking and moving around Mobility: Walking and Moving Around Current Status [id number]): At least 60 percent but less than 80 percent impaired, limited or restricted Mobility: Walking and Moving Around Goal Status [phone]): At least 1 percent but less than 20 percent impaired, limited or restricted    Svea Pusch 12/14/2015, 11:32 AM Pager [phone]

## 2015-12-15 ENCOUNTER — Inpatient Hospital Stay (HOSPITAL_COMMUNITY)
Admission: RE | Admit: 2015-12-15 | Discharge: 2015-12-29 | DRG: 057 | Disposition: A | Discharge status: Home / Self Care | Payer: Medicare Other | Source: Intra-hospital | Attending: Physical Medicine & Rehabilitation | Admitting: Physical Medicine & Rehabilitation

## 2015-12-15 ENCOUNTER — Inpatient Hospital Stay (HOSPITAL_COMMUNITY): Payer: Medicare Other

## 2015-12-15 DIAGNOSIS — N318 Other neuromuscular dysfunction of bladder: Secondary | ICD-10-CM | POA: Diagnosis present

## 2015-12-15 DIAGNOSIS — E7800 Pure hypercholesterolemia, unspecified: Secondary | ICD-10-CM | POA: Diagnosis not present

## 2015-12-15 DIAGNOSIS — N3289 Other specified disorders of bladder: Secondary | ICD-10-CM | POA: Diagnosis not present

## 2015-12-15 DIAGNOSIS — Z951 Presence of aortocoronary bypass graft: Secondary | ICD-10-CM | POA: Diagnosis not present

## 2015-12-15 DIAGNOSIS — E876 Hypokalemia: Secondary | ICD-10-CM

## 2015-12-15 DIAGNOSIS — E785 Hyperlipidemia, unspecified: Secondary | ICD-10-CM | POA: Diagnosis not present

## 2015-12-15 DIAGNOSIS — Z87891 Personal history of nicotine dependence: Secondary | ICD-10-CM | POA: Diagnosis not present

## 2015-12-15 DIAGNOSIS — N319 Neuromuscular dysfunction of bladder, unspecified: Secondary | ICD-10-CM | POA: Diagnosis not present

## 2015-12-15 DIAGNOSIS — I10 Essential (primary) hypertension: Secondary | ICD-10-CM

## 2015-12-15 DIAGNOSIS — I639 Cerebral infarction, unspecified: Secondary | ICD-10-CM | POA: Insufficient documentation

## 2015-12-15 DIAGNOSIS — G4734 Idiopathic sleep related nonobstructive alveolar hypoventilation: Secondary | ICD-10-CM | POA: Diagnosis present

## 2015-12-15 DIAGNOSIS — I6330 Cerebral infarction due to thrombosis of unspecified cerebral artery: Secondary | ICD-10-CM

## 2015-12-15 DIAGNOSIS — E119 Type 2 diabetes mellitus without complications: Secondary | ICD-10-CM | POA: Diagnosis not present

## 2015-12-15 DIAGNOSIS — G4733 Obstructive sleep apnea (adult) (pediatric): Secondary | ICD-10-CM

## 2015-12-15 DIAGNOSIS — K5900 Constipation, unspecified: Secondary | ICD-10-CM | POA: Diagnosis not present

## 2015-12-15 DIAGNOSIS — Z7982 Long term (current) use of aspirin: Secondary | ICD-10-CM

## 2015-12-15 DIAGNOSIS — I2510 Atherosclerotic heart disease of native coronary artery without angina pectoris: Secondary | ICD-10-CM

## 2015-12-15 DIAGNOSIS — Z8546 Personal history of malignant neoplasm of prostate: Secondary | ICD-10-CM | POA: Diagnosis not present

## 2015-12-15 DIAGNOSIS — K219 Gastro-esophageal reflux disease without esophagitis: Secondary | ICD-10-CM | POA: Diagnosis not present

## 2015-12-15 DIAGNOSIS — R9431 Abnormal electrocardiogram [ECG] [EKG]: Secondary | ICD-10-CM | POA: Diagnosis present

## 2015-12-15 DIAGNOSIS — Z7902 Long term (current) use of antithrombotics/antiplatelets: Secondary | ICD-10-CM

## 2015-12-15 DIAGNOSIS — Z794 Long term (current) use of insulin: Secondary | ICD-10-CM

## 2015-12-15 DIAGNOSIS — E1159 Type 2 diabetes mellitus with other circulatory complications: Secondary | ICD-10-CM | POA: Diagnosis not present

## 2015-12-15 DIAGNOSIS — I69359 Hemiplegia and hemiparesis following cerebral infarction affecting unspecified side: Secondary | ICD-10-CM

## 2015-12-15 DIAGNOSIS — I4581 Long QT syndrome: Secondary | ICD-10-CM

## 2015-12-15 DIAGNOSIS — N179 Acute kidney failure, unspecified: Secondary | ICD-10-CM

## 2015-12-15 DIAGNOSIS — J449 Chronic obstructive pulmonary disease, unspecified: Secondary | ICD-10-CM | POA: Diagnosis not present

## 2015-12-15 DIAGNOSIS — Z79899 Other long term (current) drug therapy: Secondary | ICD-10-CM | POA: Diagnosis not present

## 2015-12-15 DIAGNOSIS — I25708 Atherosclerosis of coronary artery bypass graft(s), unspecified, with other forms of angina pectoris: Secondary | ICD-10-CM | POA: Diagnosis not present

## 2015-12-15 DIAGNOSIS — I69392 Facial weakness following cerebral infarction: Principal | ICD-10-CM

## 2015-12-15 DIAGNOSIS — I350 Nonrheumatic aortic (valve) stenosis: Secondary | ICD-10-CM

## 2015-12-15 DIAGNOSIS — D649 Anemia, unspecified: Secondary | ICD-10-CM | POA: Diagnosis present

## 2015-12-15 DIAGNOSIS — I25810 Atherosclerosis of coronary artery bypass graft(s) without angina pectoris: Secondary | ICD-10-CM | POA: Diagnosis present

## 2015-12-15 LAB — BASIC METABOLIC PANEL WITH GFR
Anion gap: 8 (ref 5–15)
BUN: 16 mg/dL (ref 6–20)
CO2: 24 mmol/L (ref 22–32)
Calcium: 8.7 mg/dL — ABNORMAL LOW (ref 8.9–10.3)
Chloride: 109 mmol/L (ref 101–111)
Creatinine, Ser: 1.18 mg/dL (ref 0.61–1.24)
GFR calc Af Amer: >60 mL/min
GFR calc non Af Amer: 54 mL/min — ABNORMAL LOW
Glucose, Bld: 148 mg/dL — ABNORMAL HIGH (ref 65–99)
Potassium: 3.5 mmol/L (ref 3.5–5.1)
Sodium: 141 mmol/L (ref 135–145)

## 2015-12-15 LAB — CBC
HCT: 27.3 % — ABNORMAL LOW (ref 39.0–52.0)
Hemoglobin: 8.6 g/dL — ABNORMAL LOW (ref 13.0–17.0)
MCH: 28.5 pg (ref 26.0–34.0)
MCHC: 31.5 g/dL (ref 30.0–36.0)
MCV: 90.4 fL (ref 78.0–100.0)
Platelets: 176 10*3/uL (ref 150–400)
RBC: 3.02 MIL/uL — ABNORMAL LOW (ref 4.22–5.81)
RDW: 18.6 % — ABNORMAL HIGH (ref 11.5–15.5)
WBC: 4.9 10*3/uL (ref 4.0–10.5)

## 2015-12-15 LAB — HEMOGLOBIN A1C
Hgb A1c MFr Bld: 6.8 % — ABNORMAL HIGH (ref 4.8–5.6)
Mean Plasma Glucose: 148 mg/dL

## 2015-12-15 LAB — GLUCOSE, CAPILLARY
Glucose-Capillary: 157 mg/dL — ABNORMAL HIGH (ref 65–99)
Glucose-Capillary: 152 mg/dL — ABNORMAL HIGH (ref 65–99)
Glucose-Capillary: 166 mg/dL — ABNORMAL HIGH (ref 65–99)

## 2015-12-15 MED ORDER — CLOPIDOGREL BISULFATE 75 MG PO TABS
75.0000 mg | ORAL_TABLET | Freq: Every day | ORAL | Status: DC
  Administered 2015-12-16 – 2015-12-29 (×14): 75 mg via ORAL
  Filled 2015-12-15 (×14): qty 1

## 2015-12-15 MED ORDER — DIPHENHYDRAMINE HCL 12.5 MG/5ML PO ELIX: 12.5000 mg | ORAL_SOLUTION | Freq: Four times a day (QID) | ORAL | Status: DC | PRN

## 2015-12-15 MED ORDER — GUAIFENESIN-DM 100-10 MG/5ML PO SYRP
5.0000 mL | ORAL_SOLUTION | Freq: Four times a day (QID) | ORAL | Status: DC | PRN
  Administered 2015-12-17 – 2015-12-28 (×3): 10 mL via ORAL
  Filled 2015-12-15 (×3): qty 10

## 2015-12-15 MED ORDER — HYDROCODONE-ACETAMINOPHEN 5-325 MG PO TABS: 1.0000 | ORAL_TABLET | Freq: Four times a day (QID) | ORAL | Status: DC | PRN

## 2015-12-15 MED ORDER — LATANOPROST 0.005 % OP SOLN
1.0000 [drp] | Freq: Every day | OPHTHALMIC | Status: DC
  Administered 2015-12-15 – 2015-12-28 (×14): 1 [drp] via OPHTHALMIC
  Filled 2015-12-15: qty 2.5

## 2015-12-15 MED ORDER — INSULIN ASPART 100 UNIT/ML ~~LOC~~ SOLN
0.0000 [IU] | Freq: Three times a day (TID) | SUBCUTANEOUS | Status: DC
  Administered 2015-12-15 – 2015-12-17 (×4): 1 [IU] via SUBCUTANEOUS
  Administered 2015-12-20: 2 [IU] via SUBCUTANEOUS
  Administered 2015-12-21 – 2015-12-28 (×6): 1 [IU] via SUBCUTANEOUS

## 2015-12-15 MED ORDER — OXYBUTYNIN CHLORIDE ER 5 MG PO TB24
5.0000 mg | ORAL_TABLET | Freq: Every day | ORAL | Status: DC
  Administered 2015-12-16 – 2015-12-25 (×6): 5 mg via ORAL
  Filled 2015-12-15 (×14): qty 1

## 2015-12-15 MED ORDER — ACETAMINOPHEN 325 MG PO TABS
325.0000 mg | ORAL_TABLET | ORAL | Status: DC | PRN
  Administered 2015-12-15 – 2015-12-28 (×7): 650 mg via ORAL
  Filled 2015-12-15 (×6): qty 2

## 2015-12-15 MED ORDER — ENOXAPARIN SODIUM 40 MG/0.4ML ~~LOC~~ SOLN
40.0000 mg | SUBCUTANEOUS | Status: DC
  Administered 2015-12-15 – 2015-12-28 (×14): 40 mg via SUBCUTANEOUS
  Filled 2015-12-15 (×14): qty 0.4

## 2015-12-15 MED ORDER — ONDANSETRON HCL 4 MG/2ML IJ SOLN: 4.0000 mg | Freq: Four times a day (QID) | INTRAMUSCULAR | Status: DC | PRN

## 2015-12-15 MED ORDER — POTASSIUM CHLORIDE CRYS ER 20 MEQ PO TBCR
60.0000 meq | EXTENDED_RELEASE_TABLET | Freq: Every day | ORAL | Status: DC
  Administered 2015-12-16 – 2015-12-21 (×6): 60 meq via ORAL
  Filled 2015-12-15 (×7): qty 3

## 2015-12-15 MED ORDER — BISACODYL 10 MG RE SUPP
10.0000 mg | Freq: Every day | RECTAL | Status: DC | PRN
  Administered 2015-12-18: 10 mg via RECTAL
  Filled 2015-12-15: qty 1

## 2015-12-15 MED ORDER — ALUM & MAG HYDROXIDE-SIMETH 200-200-20 MG/5ML PO SUSP: 30.0000 mL | ORAL | Status: DC | PRN

## 2015-12-15 MED ORDER — TRAZODONE HCL 50 MG PO TABS: 25.0000 mg | ORAL_TABLET | Freq: Every evening | ORAL | Status: DC | PRN

## 2015-12-15 MED ORDER — PROCHLORPERAZINE MALEATE 5 MG PO TABS: 5.0000 mg | ORAL_TABLET | Freq: Four times a day (QID) | ORAL | Status: DC | PRN

## 2015-12-15 MED ORDER — LIRAGLUTIDE 18 MG/3ML ~~LOC~~ SOPN
1.8000 mg | PEN_INJECTOR | SUBCUTANEOUS | Status: DC
  Administered 2015-12-16 – 2015-12-29 (×14): 1.8 mg via INTRAMUSCULAR
  Filled 2015-12-15: qty 3

## 2015-12-15 MED ORDER — CYCLOSPORINE 0.05 % OP EMUL
1.0000 [drp] | Freq: Two times a day (BID) | OPHTHALMIC | Status: DC
  Administered 2015-12-15 – 2015-12-29 (×28): 1 [drp] via OPHTHALMIC
  Filled 2015-12-15 (×30): qty 1

## 2015-12-15 MED ORDER — PANTOPRAZOLE SODIUM 40 MG PO TBEC
40.0000 mg | DELAYED_RELEASE_TABLET | Freq: Every day | ORAL | Status: DC
  Administered 2015-12-16 – 2015-12-29 (×14): 40 mg via ORAL
  Filled 2015-12-15 (×15): qty 1

## 2015-12-15 MED ORDER — ASPIRIN EC 81 MG PO TBEC
81.0000 mg | DELAYED_RELEASE_TABLET | Freq: Every day | ORAL | Status: DC
  Administered 2015-12-16 – 2015-12-29 (×14): 81 mg via ORAL
  Filled 2015-12-15 (×14): qty 1

## 2015-12-15 MED ORDER — FUROSEMIDE 40 MG PO TABS
40.0000 mg | ORAL_TABLET | Freq: Every day | ORAL | Status: DC
  Administered 2015-12-16 – 2015-12-21 (×6): 40 mg via ORAL
  Filled 2015-12-15 (×6): qty 1

## 2015-12-15 MED ORDER — PROCHLORPERAZINE 25 MG RE SUPP: 12.5000 mg | Freq: Four times a day (QID) | RECTAL | Status: DC | PRN

## 2015-12-15 MED ORDER — ALBUTEROL SULFATE (2.5 MG/3ML) 0.083% IN NEBU: 3.0000 mL | INHALATION_SOLUTION | RESPIRATORY_TRACT | Status: DC | PRN

## 2015-12-15 MED ORDER — ONDANSETRON HCL 4 MG PO TABS: 4.0000 mg | ORAL_TABLET | Freq: Four times a day (QID) | ORAL | Status: DC | PRN

## 2015-12-15 MED ORDER — INSULIN ASPART 100 UNIT/ML ~~LOC~~ SOLN: 0.0000 [IU] | Freq: Three times a day (TID) | SUBCUTANEOUS | Status: DC

## 2015-12-15 MED ORDER — TIOTROPIUM BROMIDE MONOHYDRATE 18 MCG IN CAPS
18.0000 ug | ORAL_CAPSULE | Freq: Every day | RESPIRATORY_TRACT | Status: DC
  Administered 2015-12-16 – 2015-12-29 (×14): 18 ug via RESPIRATORY_TRACT
  Filled 2015-12-15 (×3): qty 5

## 2015-12-15 MED ORDER — PROCHLORPERAZINE EDISYLATE 5 MG/ML IJ SOLN: 5.0000 mg | Freq: Four times a day (QID) | INTRAMUSCULAR | Status: DC | PRN

## 2015-12-15 MED ORDER — ROSUVASTATIN CALCIUM 10 MG PO TABS
20.0000 mg | ORAL_TABLET | Freq: Every day | ORAL | Status: DC
  Administered 2015-12-16 – 2015-12-29 (×14): 20 mg via ORAL
  Filled 2015-12-15: qty 1
  Filled 2015-12-15: qty 2
  Filled 2015-12-15 (×2): qty 1
  Filled 2015-12-15 (×2): qty 2
  Filled 2015-12-15 (×5): qty 1
  Filled 2015-12-15 (×3): qty 2

## 2015-12-15 MED ORDER — SENNOSIDES-DOCUSATE SODIUM 8.6-50 MG PO TABS
1.0000 | ORAL_TABLET | Freq: Every evening | ORAL | Status: DC | PRN
  Administered 2015-12-17: 1 via ORAL
  Filled 2015-12-15: qty 1

## 2015-12-15 MED ORDER — FLEET ENEMA 7-19 GM/118ML RE ENEM: 1.0000 | ENEMA | Freq: Once | RECTAL | Status: DC | PRN

## 2015-12-15 NOTE — Progress Notes (Signed)
VASCULAR LAB PRELIMINARY  PRELIMINARY  PRELIMINARY  PRELIMINARY   Carotid duplex  completed.     Bilateral:  1-39% ICA stenosis.  Vertebral artery flow is antegrade.      Tzipporah Nagorski, RVT, RDMS 12/15/2015, 10:06 AM

## 2015-12-15 NOTE — PMR Pre-admission (Signed)
PMR Admission Coordinator Pre-Admission Assessment  Patient: Douglas Carrillo is an 81 y.o., male MRN: [id number] DOB: September 30, 1928 Height: 6' (182.9 cm) Weight: 92.987 kg (205 lb)              Insurance Information HMO:      PPO:       PCP:       IPA:       80/20:       OTHER:   PRIMARY: Medicare A/B      Policy#: [id number]      Subscriber: Phil Dopp CM Name:        Phone#:       Fax#:   Pre-Cert#:        Employer: Retired Benefits:  Phone #:       Name: Checked in [place]. Date: 06/23/95     Deduct: $1288      Out of Pocket Max: none      Life Max: unlimited CIR: 100%      SNF: 100 days Outpatient: 80%     Co-Pay: 20% Home Health: 100%      Co-Pay: none DME: 80%     Co-Pay: 20% Providers: patient's choice  SECONDARY: UHC options      Policy#: [id number]      Subscriber: Phil Dopp CM Name:        Phone#:       Fax#:   Pre-Cert#:        Employer: Retired Benefits:  Phone #: [phone]     Name:   Eff. Date:       Deduct:        Out of Pocket Max:        Life Max:   CIR:        SNF:   Outpatient:       Co-Pay:   Home Health:        Co-Pay:   DME:       Co-Pay:    Tertiary:  Tricare for Life             Policy#: [id number]        Subscriber:  Seanpaul Mcghie Benefits:  Phone #: [phone]  Emergency [place]    Name Relation Home Work Mobile   Wen,Kim Daughter [phone]       Current Medical History  Patient Admitting Diagnosis: Left medulla embolic infarct   History of Present Illness: An 81 y.o. right handed male with history of HTN, DM type2, CABG, OSA--oxygen at nights, severe aortic stenosis with recent onset of SOB and cardiac cath 12/20 with transient right arm and leg weakness post procedure likely due to embolic infarct. He was discharged to home but readmitted the next day on 12/21 am with fall out of bed due to right leg weakness and decreased sensation right half of his body. MRI brain done revealing acute infarct  along ventral surface of left medulla and MRA brain/head without significant stenosis. Dr. Leonie Man consulted and felt that stroke due to sequela of cardiac cath and to continue Plavix for secondary stroke prevention. Carotid dopplers without significant ICA stenosis. Patient with resultant mild right facial droop, RLE weakness affecting balance and mobility as well as hypoxia with activity. CIR recommended for follow up therapy  Independent PTA but did get dyspneic at times with household ambulation and walking to mail box. Completed cardio-pulmonary rehab earlier this year. Daughter lives around the corner (within 1 minute) and works partime/PRN as a Marine scientist but  available 24/7 after next week.     Total: 3=NIH  Past Medical History  Past Medical History  Diagnosis Date  . Hypertension   . High cholesterol   . Prostate cancer ([place])   . CAD (coronary artery disease)   . OSA (obstructive sleep apnea)   . COPD (chronic obstructive pulmonary disease) ([facility])     MODERATE  . Diabetes mellitus ([place])   . GERD (gastroesophageal reflux disease)   . Vertigo   . Arthritis     OA  . Anemia     Family History  family history includes Breast cancer in his sister; Kidney disease in his brother; Rheum arthritis in his mother.  Prior Rehab/Hospitalizations: Had pulmonary rehab which completed this past 08/16.  Has the patient had major surgery during 100 days prior to admission? No  Current Medications   Current facility-administered medications:  .  0.9 %  sodium chloride infusion, 250 mL, Intravenous, PRN, Willia Craze, NP .  acetaminophen (TYLENOL) tablet 650 mg, 650 mg, Oral, Q6H PRN, 650 mg at 12/15/15 1031 **OR** acetaminophen (TYLENOL) suppository 650 mg, 650 mg, Rectal, Q6H PRN, Willia Craze, NP .  albuterol (PROVENTIL) (2.5 MG/3ML) 0.083% nebulizer solution 3 mL, 3 mL, Inhalation, Q4H PRN, Willia Craze, NP, 3 mL at 12/15/15 0747 .  aspirin chewable tablet 81 mg, 81 mg, Oral,  Daily, Albertine Patricia, MD, 81 mg at 12/15/15 1031 .  clopidogrel (PLAVIX) tablet 75 mg, 75 mg, Oral, Daily, Willia Craze, NP, 75 mg at 12/15/15 1031 .  cycloSPORINE (RESTASIS) 0.05 % ophthalmic emulsion 1 drop, 1 drop, Both Eyes, BID, Willia Craze, NP, 1 drop at 12/15/15 1032 .  enoxaparin (LOVENOX) injection 40 mg, 40 mg, Subcutaneous, Q24H, Willia Craze, NP, 40 mg at 12/15/15 1156 .  furosemide (LASIX) tablet 40 mg, 40 mg, Oral, Daily, Willia Craze, NP, 40 mg at 12/15/15 1031 .  HYDROcodone-acetaminophen (NORCO/VICODIN) 5-325 MG per tablet 1-2 tablet, 1-2 tablet, Oral, Q4H PRN, Willia Craze, NP .  insulin aspart (novoLOG) injection 0-9 Units, 0-9 Units, Subcutaneous, TID WC, Willia Craze, NP, 2 Units at 12/15/15 1156 .  latanoprost (XALATAN) 0.005 % ophthalmic solution 1 drop, 1 drop, Both Eyes, QHS, Willia Craze, NP, 1 drop at 12/14/15 2107 .  Liraglutide SOPN 1.8 mg, 1.8 mg, Injection, BH-q7a, Willia Craze, NP, 1.8 mg at 12/15/15 1249 .  ondansetron (ZOFRAN) tablet 4 mg, 4 mg, Oral, Q6H PRN **OR** ondansetron (ZOFRAN) injection 4 mg, 4 mg, Intravenous, Q6H PRN, Willia Craze, NP .  oxybutynin (DITROPAN-XL) 24 hr tablet 5 mg, 5 mg, Oral, Daily, Willia Craze, NP, 5 mg at 12/15/15 1032 .  pantoprazole (PROTONIX) EC tablet 40 mg, 40 mg, Oral, Daily, Willia Craze, NP, 40 mg at 12/15/15 1031 .  potassium chloride SA (K-DUR,KLOR-CON) CR tablet 60 mEq, 60 mEq, Oral, Daily, Willia Craze, NP, 60 mEq at 12/15/15 1031 .  rosuvastatin (CRESTOR) tablet 20 mg, 20 mg, Oral, Daily, Willia Craze, NP, 20 mg at 12/15/15 1031 .  senna-docusate (Senokot-S) tablet 1 tablet, 1 tablet, Oral, QHS PRN, Willia Craze, NP .  sodium chloride 0.9 % injection 3 mL, 3 mL, Intravenous, Q12H, Willia Craze, NP, 3 mL at 12/14/15 2110 .  sodium chloride 0.9 % injection 3 mL, 3 mL, Intravenous, PRN, Willia Craze, NP .  tiotropium [facility]) inhalation capsule 18 mcg, 18  mcg, Inhalation, Daily, Waldemar Dickens, MD, 18 mcg  at 12/15/15 [id number]  Patients Current Diet: Diet heart healthy/carb modified Room service appropriate?: Yes; Fluid consistency:: Thin  Precautions / Restrictions Precautions Precautions: Fall Restrictions Weight Bearing Restrictions: No   Has the patient had 2 or more falls or a fall with injury in the past year?Yes.  Has fallen twice in his home with bruising on back.  Prior Activity Level Community (5-7x/wk): Went out 6 days a week.  Likes to walk at [place] on Sunday mornings.  Home Assistive Devices / Equipment Home Assistive Devices/Equipment: Eyeglasses, CBG Meter Home Equipment: Kasandra Knudsen - single point  Prior Device Use: Indicate devices/aids used by the patient prior to current illness, exacerbation or injury? None  Prior Functional Level Prior Function Level of Independence: Independent Comments: Drives, goes to the gym everyday, and like watching sports (especially college basketball)  Self Care: Did the patient need help bathing, dressing, using the toilet or eating?  Independent.  He reports that daughter cooks for him sometimes.  Indoor Mobility: Did the patient need assistance with walking from room to room (with or without device)? Independent  Stairs: Did the patient need assistance with internal or external stairs (with or without device)? Independent  Functional Cognition: Did the patient need help planning regular tasks such as shopping or remembering to take medications? Independent  Current Functional Level Cognition  Overall Cognitive Status: Within Functional Limits for tasks assessed Orientation Level: Oriented X4    Extremity Assessment (includes Sensation/Coordination)  Upper Extremity Assessment: Defer to OT evaluation RUE Deficits / Details: STRENGTH: shoulder 2-/5, biceps/triceps 3/5, decreased grip strength; AROM shoulder 50 degrees, elbow WFL, wrist flexion 60 degrees, wrist extension 30 degrees ,  forearm pronation WFL, forearm supination 60 degrees, unable to achieve full extension of digits 4 and 5; SENSATION: decreased light touch/numbness from elbow down, more severe distally RUE Sensation: decreased light touch, decreased proprioception RUE Coordination: decreased fine motor, decreased gross motor  Lower Extremity Assessment: RLE deficits/detail RLE Deficits / Details: hip flexion 2+, knee extension 2+, DF 0, toe extension 2+ RLE Sensation: decreased light touch RLE Coordination: decreased gross motor    ADLs  Overall ADL's : Needs assistance/impaired    Mobility  Overal bed mobility: Needs Assistance Bed Mobility: Supine to Sit Rolling: Min assist Sidelying to sit: Mod assist Supine to sit: Mod assist Sit to supine: Mod assist General bed mobility comments: exit to Rt HOB flat, no rail; pt self-selected supine to sit; assist to raise torson    Transfers  Overall transfer level: Needs assistance Equipment used: Rolling walker (2 wheeled) Transfers: Sit to/from Stand Sit to Stand: Min assist, Min guard Stand pivot transfers: Mod assist, +2 physical assistance General transfer comment: Lt side tends to overpower and push him to his Rt; as fatigued required slightly more assist    Ambulation / Gait / Stairs / Wheelchair Mobility  Ambulation/Gait Ambulation/Gait assistance: Mod assist, +2 safety/equipment Ambulation Distance (Feet): 16 Feet (seated rest; 25) Assistive device: Rolling walker (2 wheeled) Gait Pattern/deviations: Step-through pattern, Decreased step length - right, Decreased step length - left, Decreased stance time - right, Decreased dorsiflexion - right, Steppage (knee hyperextends with fatigue) General Gait Details: pivotal steps only Gait velocity interpretation: Below normal speed for age/gender    Posture / Balance Dynamic Sitting Balance Sitting balance - Comments: EOC Balance Overall balance assessment: Needs assistance Sitting-balance support:  Bilateral upper extremity supported Sitting balance-Leahy Scale: Fair Sitting balance - Comments: EOC Postural control: Posterior lean, Right lateral lean Standing balance support: Bilateral upper extremity  supported Standing balance-Leahy Scale: Poor Standing balance comment: hyperextends RLE    Special needs/care consideration BiPAP/CPAP No CPM No Continuous Drip IV NO Dialysis No      Life Vest No Oxygen Yes, currently on 02 [place], also uses 02 at night at home at 2L [place] Special Bed No Trach Size No Wound Vac (area) No      Skin Has bruising on right back and right groin                            Bowel mgmt: Last BM 12/15/15 Bladder mgmt: Voiding in urinal, has had 2 accidents in past couple days Diabetic mgmt Yes, on oral medications and on Victoza at home    Previous Home Environment Living Arrangements: Alone Available Help at Discharge: Family, Available 24 hours/day Type of Home: House Home Layout: One level Home Access: Stairs to enter Entrance Stairs-Rails: None Entrance Stairs-Number of Steps: 2 Bathroom Shower/Tub: Tub/shower unit, Architectural technologist: Standard Home Care Services: No Additional Comments: Daughter lives around the corner, is a semi-retired Therapist, sports and pt states she can provide 24/7 care if needed.  Discharge Living Setting Plans for Discharge Living Setting: Patient's home, Alone, House (Lives alone.) Type of Home at Discharge: House Discharge Home Layout: One level Discharge Home Access: Stairs to enter Entrance Stairs-Number of Steps: 2 Does the patient have any problems obtaining your medications?: No  Social/Family/Support Systems Patient Roles: Parent, Other (Comment) (Has a daughter and a granddaughter.) Contact Information: [name] - daughter Anticipated Caregiver: Dtr and self Anticipated Caregiver's Contact Information: [name] [phone] Ability/Limitations of Caregiver: Dtr is a Marine scientist and works PRN 12 hr shifts.  She lives within 1  minute of patient. Caregiver Availability: Intermittent Discharge Plan Discussed with Primary Caregiver: Yes Is Caregiver In Agreement with Plan?: Yes Does Caregiver/Family have Issues with Lodging/Transportation while Pt is in Rehab?: No  Goals/Additional Needs Patient/Family Goal for Rehab: PT/OT mod I and supervision goals Expected length of stay: 14-18 days Cultural Considerations: Pres[facility] Dietary Needs: Heart healthy, carb mod, thin liquids diet Equipment Needs: TBD Pt/Family Agrees to Admission and willing to participate: Yes Program Orientation Provided & Reviewed with Pt/Caregiver Including Roles  & Responsibilities: Yes  Decrease burden of Care through IP rehab admission: N/A  Possible need for SNF placement upon discharge: Not planned  Patient Condition: This patient's condition remains as documented in the consult dated 12/15/15, in which the Rehabilitation Physician determined and documented that the patient's condition is appropriate for intensive rehabilitative care in an inpatient rehabilitation facility. Will admit to inpatient rehab today.  Preadmission Screen Completed By:  [name], 12/15/2015 1:54 PM ______________________________________________________________________   Discussed status with Dr. Posey Pronto on 12/15/15 at 1402 and received telephone approval for admission today.  Admission Coordinator:  [name], time1402/Date12/23/16

## 2015-12-15 NOTE — Progress Notes (Signed)
STROKE TEAM PROGRESS NOTE   HISTORY Douglas Carrillo is an 81 y.o. male with hx of HTN, HLP, DM, CABG 16 years ago on plavix who has been having SOB on exertion who had an outpatient cardiac cath yesterday. Went home at his baseline but woke up this am (LKW 12/13/2015, time unknown) with R sided arm and leg weakness. Modified Rankin: Rankin Score=0. Patient was not administered TPA secondary to unknown time last known well. He was admitted for further evaluation and treatment.   SUBJECTIVE (INTERVAL HISTORY) His   Granddaughter is at the bedside.  Overall he feels his condition is stable. He feels he is back to baseline.    OBJECTIVE Temp:  [98 F (36.7 C)-98.7 F (37.1 C)] 98.6 F (37 C) (12/23 0509) Pulse Rate:  [67-82] 76 (12/23 0509) Cardiac Rhythm:  [-] Normal sinus rhythm (12/23 0852) Resp:  [20] 20 (12/23 0509) BP: (145-159)/(44-66) 145/49 mmHg (12/23 0509) SpO2:  [94 %-100 %] 94 % (12/23 0821)  CBC:   Recent Labs Lab 12/13/15 0735 12/14/15 0354 12/15/15 0410  WBC 6.5 5.7 4.9  NEUTROABS 4.5  --   --   HGB 9.4* 8.7* 8.6*  HCT 29.6* 27.7* 27.3*  MCV 90.8 89.9 90.4  PLT 196 198 0000000    Basic Metabolic Panel:   Recent Labs Lab 12/14/15 0354 12/15/15 0410  NA 142 141  K 3.6 3.5  CL 110 109  CO2 23 24  GLUCOSE 151* 148*  BUN 16 16  CREATININE 1.10 1.18  CALCIUM 8.9 8.7*    Lipid Panel:     Component Value Date/Time   CHOL 105 12/14/2015 0354   TRIG 76 12/14/2015 0354   HDL 37* 12/14/2015 0354   CHOLHDL 2.8 12/14/2015 0354   VLDL 15 12/14/2015 0354   LDLCALC 53 12/14/2015 0354   HgbA1c:  Lab Results  Component Value Date   HGBA1C 6.8* 12/14/2015   Urine Drug Screen: No results found for: LABOPIA, COCAINSCRNUR, LABBENZ, AMPHETMU, THCU, LABBARB    IMAGING  Ct Head Wo Contrast 12/13/2015   No acute abnormality noted.   Mr Jeri Cos Wo Contrast 12/13/2015   Acute infarction along the ventral surface of the left medulla. No hemorrhage or swelling. Mild  chronic small-vessel ischemic changes elsewhere affecting the brain, fairly typical for age.   Mr Angiogram Head & Neck W Wo Contrast 12/13/2015  No carotid bifurcation stenosis other than 50% narrowing of the external carotid artery on the right. Vertebral artery origins can't be accurately evaluated because of physiologic motion. Small left vertebral artery arises directly from the arch. Vertebral arteries both widely patent in the neck to the basilar.   Dg Chest Port 1 View 12/13/2015  Mildly increased vascular congestion without overt pulmonary edema or confluent airspace opacity. Stable cardiomegaly.   2D Echocardiogram  - Left ventricle: The cavity size was normal. Posterior wall  thickness was mildly increased. Systolic function was normal. The estimated ejection fraction was in the range of 50% to 55%. Wall motion was normal; there were no regional wall motionabnormalities. Features are consistent with a pseudonormal leftventricular filling pattern, with concomitant abnormal relaxationand increased filling pressure (grade 2 diastolic dysfunction).Doppler parameters are consistent with high ventricular fillingpressure. - Aortic valve: Valve mobility was severely restricted. There wassevere stenosis. There was mild to moderate regurgitation. Valvearea (VTI): 0.67 cm^2. Valve area (Vmax): 0.65 cm^2. Valve area(Vmean): 0.61 cm^2. - Mitral valve: Severely calcified annulus. The findings areconsistent with mild stenosis. Valve area by continuity equation(using LVOT flow): 1.47  cm^2. - Left atrium: The atrium was moderately dilated. - Right ventricle: Systolic function was mildly reduced. - Right atrium: The atrium was mildly dilated. - Inferior vena cava: The vessel was normal in size. Therespirophasic diameter changes were in the normal range (>= 50%),consistent with normal central venous pressure.  Carotid Dopplers : 1-39% ICA stenosis. Vertebral artery flow is antegrade.     PHYSICAL EXAM Pleasant elderly Caucasian male not in distress. . Afebrile. Head is nontraumatic. Neck is supple without bruit.    Cardiac exam no murmur or gallop. Lungs are clear to auscultation. Distal pulses are well felt. Neurological Exam : Awake alert oriented 3 with normal speech and language function. Extraocular movements are full range without nystagmus. Fundi were not visualized. Pupils are equal reactive. Vision acuity and fields seem adequate. Mild right lower facial asymmetry when he smiles. Tongue midline. Motor system exam no upper or lower extremity drift but mild weakness of the right grip and intrinsic hand muscles. Orbits left over right upper extremity. Very minimal weakness of right hip flexors. Deep tendon reflexes are 2+ symmetric except ankle jerks are depressed. Slight diminished touch pinprick sensation in both feet from ankle down. Gait was not tested plantars are both downgoing ASSESSMENT/PLAN [name] is a 81 y.o. male with history of hypertension, hyperlipidemia, diabetes, CAD s/p CABG presenting with right arm and leg weakness s/p cardiac cath day prior. He did not receive IV t-PA due to unknown time last known well.   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   Resultant  Mild RUE hemiparesis, more severe RLE hemiparesis  MRI  Left medullary infarct. small vessel disease   MRA head and neck no carotid stenosis  2D Echo 11/24/2015 No source of embolus   LDL 53  HgbA1c 6.8  Lovenox 40 mg sq daily for VTE prophylaxis Diet heart healthy/carb modified Room service appropriate?: Yes; Fluid consistency:: Thin  aspirin 81 mg daily and clopidogrel 75 mg daily prior to admission, now on clopidogrel 75 mg daily. From stroke standpoint, no indication for dual antiplatelets, however, given current cardiac status, recommend  continuation of both.   Patient counseled to be compliant with his antithrombotic  medications  Ongoing aggressive stroke risk factor management  Therapy recommendations:  CLR  Disposition:  pending   Hypertension  Stable  Hyperlipidemia  Home meds:  Crestor 20 and Zetia 10 with crestor resumed in hospital  LDL 53, goal < 70  Continue statin at discharge  Diabetes  HgbA1c pending, goal < 7.0  Other Stroke Risk Factors  Advanced age  Former Cigarette smoker, quit smoking 31 years ago   ETOH use  Coronary artery disease s/p CABG 16 yrs ago. Yesterday with grafts patent, global hypokinesis and an EF 30-35%  Obstructive sleep apnea, on O2 alone at home. CPAP not recommended  AORTIC VALVE STENOSIS, MODERATELY SEVERE  Other Active Problems  Elevated troponin at 0.47  Prolonged QT interval  COPD  Acute kidney injury  Normocytic anemia, chronic. Baseline hemoglobin 10.1;  8.7 today.  Hospital day # 2     I have personally examined this patient, reviewed notes, independently viewed imaging studies, participated in medical decision making and plan of care. I have made any additions or clarifications directly to the above note. Agree with note above. Patient presented with weakness and numbness following recent cardiac cath and MRI shows a left lower brainstem infarct likely due to embolization during the procedure.  Recommend transfer to  rehabilitation over the next few days. Continue aspirin.  Stroke service will sign off. Kindly call for questions. Antony Contras, MD Medical Director [facility] Pager: [phone] 12/15/2015 2:07 PM   To contact Stroke Continuity provider, please refer to [email or web address] After hours, contact General Neurology

## 2015-12-15 NOTE — Discharge Instructions (Signed)
Follow with Primary MD Limmie Patricia, MD after discharge from [place], CMP, checked  by Primary MD next visit.    Activity: As tolerated with Full fall precautions use walker/cane & assistance as needed   Disposition CIR   Diet: Heart Healthy  , carbohydrate modified, with feeding assistance and aspiration precautions.  For Heart failure patients - Check your Weight same time everyday, if you gain over 2 pounds, or you develop in leg swelling, experience more shortness of breath or chest pain, call your Primary MD immediately. Follow Cardiac Low Salt Diet and 1.5 lit/day fluid restriction.   On your next visit with your primary care physician please Get Medicines reviewed and adjusted.   Please request your Prim.MD to go over all Hospital Tests and Procedure/Radiological results at the follow up, please get all Hospital records sent to your Prim MD by signing hospital release before you go home.   If you experience worsening of your admission symptoms, develop shortness of breath, life threatening emergency, suicidal or homicidal thoughts you must seek medical attention immediately by calling 911 or calling your MD immediately  if symptoms less severe.  You Must read complete instructions/literature along with all the possible adverse reactions/side effects for all the Medicines you take and that have been prescribed to you. Take any new Medicines after you have completely understood and accpet all the possible adverse reactions/side effects.   Do not drive, operating heavy machinery, perform activities at heights, swimming or participation in water activities or provide baby sitting services if your were admitted for syncope or siezures until you have seen by Primary MD or a Neurologist and advised to do so again.  Do not drive when taking Pain medications.    Do not take more than prescribed Pain, Sleep and Anxiety Medications  Special Instructions: If you have smoked or  chewed Tobacco  in the last 2 yrs please stop smoking, stop any regular Alcohol  and or any Recreational drug use.  Wear Seat belts while driving.   Please note  You were cared for by a hospitalist during your hospital stay. If you have any questions about your discharge medications or the care you received while you were in the hospital after you are discharged, you can call the unit and asked to speak with the hospitalist on call if the hospitalist that took care of you is not available. Once you are discharged, your primary care physician will handle any further medical issues. Please note that NO REFILLS for any discharge medications will be authorized once you are discharged, as it is imperative that you return to your primary care physician (or establish a relationship with a primary care physician if you do not have one) for your aftercare needs so that they can reassess your need for medications and monitor your lab values.

## 2015-12-15 NOTE — H&P (Signed)
Physical Medicine and Rehabilitation Admission H&P    CC: Right sided weakness  HPI: Douglas Carrillo is a 81 y.o. right handed male with history of HTN, DM type2, CABG, OSA--oxygen at nights, severe aortic stenosis with recent onset of SOB and cardiac cath 12/20 with transient right arm and leg weakness post procedure likely due to embolic infarct. He was discharged to home but readmitted the next day on 12/21 am with fall out of bed due to right leg weakness and decreased sensation right half of his body. MRI brain done revealing acute infarct along ventral surface of left medulla and MRA brain/head without significant stenosis. Dr. Leonie Man consulted and felt that stroke due to sequela of cardiac cath and to continue Plavix for secondary stroke prevention. Carotid dopplers without significant ICA stenosis. Patient with resultant mild right facial droop, RLE weakness affecting balance and mobility as well as hypoxia with activity. CIR recommended for follow up therapy  Independent PTA but did get dyspneic at times with household ambulation and walking to mail box. Completed cardio-pulmonary rehab earlier this year. Daughter lives next door and works partime but available 24/7 after next week.   ROS  HENT: Negative for hearing loss.  Eyes: Negative for blurred vision and double vision.  Respiratory: Positive for shortness of breath (with activity). Negative for cough and wheezing.  Cardiovascular: Negative for chest pain and palpitations.  Gastrointestinal: Positive for heartburn. Negative for nausea, abdominal pain and constipation.  Genitourinary: Negative for dysuria.  Musculoskeletal: Positive for myalgias (right lower back due to falls X 2 prior to admission). Negative for back pain and neck pain.  Skin: Negative for itching and rash.  Neurological: Positive for sensory change, speech change and focal weakness. Negative for dizziness and headaches.  Psychiatric/Behavioral: The  patient is not nervous/anxious and does not have insomnia.  All other systems reviewed and are negative.  Past Medical History  Diagnosis Date  . Hypertension   . High cholesterol   . Prostate cancer ([place])   . CAD (coronary artery disease)   . OSA (obstructive sleep apnea)   . COPD (chronic obstructive pulmonary disease) ([facility])     MODERATE  . Diabetes mellitus ([place])   . GERD (gastroesophageal reflux disease)   . Vertigo   . Arthritis     OA  . Anemia    Past Surgical History  Procedure Laterality Date  . Coronary artery bypass graft  03/29/1999    x 4:  LIMA to the LAD,SVG to the diagonal branch of the LAD,SVG to the intermediate coronary artery & SVG to the posterior descending branch of the RCA.  . Colon surgery      x 4  . Nm myocar perf wall motion  04/28/2012    Low Risk Scan  . Prostate surgery    . Cardiac catheterization    . Cardiac catheterization N/A 12/12/2015    Procedure: Right/Left Heart Cath and Coronary/Graft Angiography;  Surgeon: Troy Sine, MD;  Location: [facility];  Service: Cardiovascular;  Laterality: N/A;  . Peripheral vascular catheterization N/A 12/12/2015    Procedure: Aortic Arch Angiography;  Surgeon: Troy Sine, MD;  Location: [facility];  Service: Cardiovascular;  Laterality: N/A;  . Eye surgery     Family History  Problem Relation Age of Onset  . Kidney disease Brother   . Breast cancer Sister   . Rheum arthritis Mother    Social History:  reports that he quit smoking about 77  years ago. His smoking use included Cigarettes. He has a 30 pack-year smoking history. He has never used smokeless tobacco. He reports that he drinks alcohol. He reports that he does not use illicit drugs. Allergies:  Allergies  Allergen Reactions  . Mold Extract [Trichophyton] Anaphylaxis  . Lipitor [Atorvastatin] Other (See Comments)    Weakness in legs/myalgias   Medications Prior to Admission  Medication Sig Dispense Refill  . aspirin  81 MG tablet Take 81 mg by mouth daily.    Marland Kitchen b complex vitamins capsule Take 1 capsule by mouth daily.    . B-D ULTRAFINE III SHORT PEN 31G X 8 MM MISC     . benzonatate (TESSALON) 100 MG capsule Take 1 capsule by mouth 3 (three) times daily as needed for cough.   1  . clopidogrel (PLAVIX) 75 MG tablet TAKE 1 TABLET DAILY 90 tablet 3  . CRESTOR 20 MG tablet Take 20 mg by mouth daily.     . furosemide (LASIX) 40 MG tablet TAKE 1 TABLET DAILY 90 tablet 0  . insulin aspart (NOVOLOG) 100 UNIT/ML injection Inject 0-9 Units into the skin 3 (three) times daily with meals. 10 mL 11  . isosorbide mononitrate (IMDUR) 60 MG 24 hr tablet TAKE 1 AND 1/2 TABLETS     DAILY 135 tablet 0  . ketotifen (THERA TEARS ALLERGY) 0.025 % ophthalmic solution Place 1 drop into both eyes 2 (two) times daily.     Marland Kitchen KLOR-CON M20 20 MEQ tablet TAKE 3 TABLETS DAILY 270 tablet 0  . latanoprost (XALATAN) 0.005 % ophthalmic solution Place 1 drop into both eyes at bedtime.    Marland Kitchen losartan-hydrochlorothiazide (HYZAAR) 100-25 MG per tablet TAKE 1 TABLET DAILY 90 tablet 3  . meclizine (ANTIVERT) 25 MG tablet Take 25 mg by mouth as needed for dizziness.    . metFORMIN (GLUCOPHAGE-XR) 500 MG 24 hr tablet Take 500 mg by mouth 2 (two) times daily.     . metoprolol succinate (TOPROL-XL) 100 MG 24 hr tablet TAKE 1 TABLET (100 MG)     EVERY MORNING AND TAKE 1/2 TABLET (50 MG) AT BEDTIME 135 tablet 0  . Multiple Vitamins-Minerals (CENTRUM SILVER ULTRA MENS PO) Take 1 tablet by mouth daily.    Marland Kitchen NEXIUM 40 MG capsule Take 40 mg by mouth daily.     Glory Rosebush DELICA LANCETS 92J MISC 1 each by Other route 2 (two) times daily. Use 1 lancet each to check glucose bid    . ONETOUCH VERIO test strip 1 strip by Other route 2 (two) times daily. Use 1 strip to check glucose bid    . oxybutynin (DITROPAN-XL) 5 MG 24 hr tablet Take 5 mg by mouth daily.     Marland Kitchen PROAIR HFA 108 (90 BASE) MCG/ACT inhaler Inhale into the lungs as needed.    . RESTASIS 0.05 %  ophthalmic emulsion Place 1 drop into both eyes 2 (two) times daily.    Marland Kitchen Spacer/Aero-Holding Chambers (AEROCHAMBER PLUS FLO-VU) MISC   1  . Tiotropium Bromide-Olodaterol (STIOLTO RESPIMAT) 2.5-2.5 MCG/ACT AERS Inhale 2 puffs into the lungs daily. 3 Inhaler 3  . VICTOZA 18 MG/3ML SOPN Inject 1.8 mg as directed every morning.     . vitamin C (ASCORBIC ACID) 500 MG tablet Take 500 mg by mouth 2 (two) times daily.    . Vitamin D, Ergocalciferol, (DRISDOL) 50000 UNITS CAPS capsule Take 50,000 Units by mouth every 14 (fourteen) days. Every other week on Sunday    . vitamin  E 400 UNIT capsule Take 400 Units by mouth daily.    Marland Kitchen ZETIA 10 MG tablet Take 10 mg by mouth daily.       Home:     Functional History:    Functional Status:  Mobility:          ADL:    Cognition:      Physical Exam: There were no vitals taken for this visit. Physical Exam  Nursing note reviewed. Constitutional: He is oriented to person, place, and time. He appears well-developed and well-nourished.  Appears younger than stated age.  HENT:  Head: Normocephalic and atraumatic.  Mouth/Throat: No oropharyngeal exudate.  Eyes: Conjunctivae and EOM are normal. Pupils are equal, round, and reactive to light. No scleral icterus.  Neck: Normal range of motion. Neck supple.  Cardiovascular: Normal rate and regular rhythm.  Holosystolic murmur.  Respiratory: Effort normal and breath sounds normal. No stridor. He exhibits no tenderness.  GI: Soft. Bowel sounds are normal. He exhibits no distension. There is no tenderness.  Musculoskeletal: He exhibits edema (right ankle with trace edema). He exhibits no tenderness.  Neurological: He is alert and oriented to person, place, and time.  Right facial weakness with minimal dysarthria.  Able to follow one and two step motor commands without difficulty.  3+ DTRs RUE/RLE Sensation diminished to light touch RUE/RLE Motor: LUE/LLE: 5/5 RUE: 3+ -4-/5 proximal to  distal RLE: hip flexion 2+/5, ankle dorsi/plantar flexion 2/5  Skin: Skin is warm and dry. No rash noted.  Tenderness with ecchymosis right flank.  Psychiatric: He has a normal mood and affect. His behavior is normal. Judgment and thought content normal.   Results for orders placed or performed during the hospital encounter of 12/13/15 (from the past 48 hour(s))  Glucose, capillary     Status: Abnormal   Collection Time: 12/13/15  9:09 PM  Result Value Ref Range   Glucose-Capillary 147 (H) 65 - 99 mg/dL   Comment 1 Notify RN    Comment 2 Document in Chart   Troponin I (q 6hr x 3)     Status: Abnormal   Collection Time: 12/13/15 10:23 PM  Result Value Ref Range   Troponin I 0.33 (H) <0.031 ng/mL    Comment:        PERSISTENTLY INCREASED TROPONIN VALUES IN THE RANGE OF 0.04-0.49 ng/mL CAN BE SEEN IN:       -UNSTABLE ANGINA       -CONGESTIVE HEART FAILURE       -MYOCARDITIS       -CHEST TRAUMA       -ARRYHTHMIAS       -LATE PRESENTING MYOCARDIAL INFARCTION       -COPD   CLINICAL FOLLOW-UP RECOMMENDED.   Hemoglobin A1c     Status: Abnormal   Collection Time: 12/14/15  3:54 AM  Result Value Ref Range   Hgb A1c MFr Bld 6.8 (H) 4.8 - 5.6 %    Comment: (NOTE)         Pre-diabetes: 5.7 - 6.4         Diabetes: >6.4         Glycemic control for adults with diabetes: <7.0    Mean Plasma Glucose 148 mg/dL    Comment: (NOTE) Performed At: [facility] [address] [place], [place] [id number] Lindon Romp MD [id number]   Lipid panel     Status: Abnormal   Collection Time: 12/14/15  3:54 AM  Result Value Ref Range   Cholesterol 105  0 - 200 mg/dL   Triglycerides 76 <150 mg/dL   HDL 37 (L) >40 mg/dL   Total CHOL/HDL Ratio 2.8 RATIO   VLDL 15 0 - 40 mg/dL   LDL Cholesterol 53 0 - 99 mg/dL    Comment:        Total Cholesterol/HDL:CHD Risk Coronary Heart Disease Risk Table                     Men   Women  1/2 Average Risk   3.4   3.3  Average Risk       5.0    4.4  2 X Average Risk   9.6   7.1  3 X Average Risk  23.4   11.0        Use the calculated Patient Ratio above and the CHD Risk Table to determine the patient's CHD Risk.        ATP III CLASSIFICATION (LDL):  <100     mg/dL   Optimal  100-129  mg/dL   Near or Above                    Optimal  130-159  mg/dL   Borderline  160-189  mg/dL   High  >190     mg/dL   Very High   Basic metabolic panel     Status: Abnormal   Collection Time: 12/14/15  3:54 AM  Result Value Ref Range   Sodium 142 135 - 145 mmol/L   Potassium 3.6 3.5 - 5.1 mmol/L   Chloride 110 101 - 111 mmol/L   CO2 23 22 - 32 mmol/L   Glucose, Bld 151 (H) 65 - 99 mg/dL   BUN 16 6 - 20 mg/dL   Creatinine, Ser 1.10 0.61 - 1.24 mg/dL   Calcium 8.9 8.9 - 10.3 mg/dL   GFR calc non Af Amer 59 (L) >60 mL/min   GFR calc Af Amer >60 >60 mL/min    Comment: (NOTE) The eGFR has been calculated using the CKD EPI equation. This calculation has not been validated in all clinical situations. eGFR's persistently <60 mL/min signify possible Chronic Kidney Disease.    Anion gap 9 5 - 15  CBC     Status: Abnormal   Collection Time: 12/14/15  3:54 AM  Result Value Ref Range   WBC 5.7 4.0 - 10.5 K/uL   RBC 3.08 (L) 4.22 - 5.81 MIL/uL   Hemoglobin 8.7 (L) 13.0 - 17.0 g/dL   HCT 27.7 (L) 39.0 - 52.0 %   MCV 89.9 78.0 - 100.0 fL   MCH 28.2 26.0 - 34.0 pg   MCHC 31.4 30.0 - 36.0 g/dL   RDW 18.5 (H) 11.5 - 15.5 %   Platelets 198 150 - 400 K/uL  Glucose, capillary     Status: Abnormal   Collection Time: 12/14/15  6:29 AM  Result Value Ref Range   Glucose-Capillary 140 (H) 65 - 99 mg/dL   Comment 1 Notify RN    Comment 2 Document in Chart   Glucose, capillary     Status: Abnormal   Collection Time: 12/14/15 11:52 AM  Result Value Ref Range   Glucose-Capillary 200 (H) 65 - 99 mg/dL  Glucose, capillary     Status: Abnormal   Collection Time: 12/14/15  4:12 PM  Result Value Ref Range   Glucose-Capillary 135 (H) 65 - 99 mg/dL    Comment 1 Notify RN    Comment 2 Document in  Chart   Glucose, capillary     Status: Abnormal   Collection Time: 12/14/15 10:10 PM  Result Value Ref Range   Glucose-Capillary 143 (H) 65 - 99 mg/dL   Comment 1 Notify RN    Comment 2 Document in Chart   CBC     Status: Abnormal   Collection Time: 12/15/15  4:10 AM  Result Value Ref Range   WBC 4.9 4.0 - 10.5 K/uL   RBC 3.02 (L) 4.22 - 5.81 MIL/uL   Hemoglobin 8.6 (L) 13.0 - 17.0 g/dL   HCT 27.3 (L) 39.0 - 52.0 %   MCV 90.4 78.0 - 100.0 fL   MCH 28.5 26.0 - 34.0 pg   MCHC 31.5 30.0 - 36.0 g/dL   RDW 18.6 (H) 11.5 - 15.5 %   Platelets 176 150 - 400 K/uL  Basic metabolic panel     Status: Abnormal   Collection Time: 12/15/15  4:10 AM  Result Value Ref Range   Sodium 141 135 - 145 mmol/L   Potassium 3.5 3.5 - 5.1 mmol/L   Chloride 109 101 - 111 mmol/L   CO2 24 22 - 32 mmol/L   Glucose, Bld 148 (H) 65 - 99 mg/dL   BUN 16 6 - 20 mg/dL   Creatinine, Ser 1.18 0.61 - 1.24 mg/dL   Calcium 8.7 (L) 8.9 - 10.3 mg/dL   GFR calc non Af Amer 54 (L) >60 mL/min   GFR calc Af Amer >60 >60 mL/min    Comment: (NOTE) The eGFR has been calculated using the CKD EPI equation. This calculation has not been validated in all clinical situations. eGFR's persistently <60 mL/min signify possible Chronic Kidney Disease.    Anion gap 8 5 - 15  Glucose, capillary     Status: Abnormal   Collection Time: 12/15/15  7:12 AM  Result Value Ref Range   Glucose-Capillary 157 (H) 65 - 99 mg/dL   Comment 1 Notify RN    Comment 2 Document in Chart   Glucose, capillary     Status: Abnormal   Collection Time: 12/15/15 11:40 AM  Result Value Ref Range   Glucose-Capillary 152 (H) 65 - 99 mg/dL   Comment 1 Document in Chart    No results found.     Medical Problem List and Plan: 1. 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  Cont to modify secondary risk factors 2.  DVT Prophylaxis/Anticoagulation: Mechanical: Sequential compression  devices, below knee Bilateral lower extremities Pharmaceutical: Lovenox 3. Pain Management: Tylenol and Norco PRN.  4. Mood: LCSW to follow for evaluation and support 5. Neuropsych: This patient is capable of making decisions on his own behalf. 6. Skin/Wound Care: Maintain adequate nutrition and hydration status. Routine pressure relief measures.  7. Fluids/Electrolytes/Nutrition: Monitor I/O. Check lytes in am. Offer supplements if intake poor. Will add protein supplement.  8. HTN  Will monitor and adjust at rest and in accordance with therapies  Monitor VS during therapies and at rest and adjust as indicated to ensure goal BP maintained  Continue medications, will restart home medications as necessary 9. DM  HbA1c 6.8 on 12/22 FSBS trend:  Recent Labs     12/13/15  0735  12/14/15  0354  12/15/15  0410  GLUCOSE  188*  151*  148*    Follow FSBS while in rehab and adjust regimen as indicated goal FSBS 70-160  Diabetic Diet  Hypoglycemia protocol in place  Will manage to avoid hypo/hyperglycemic episodes as therapies progress  If patient eats < 50% of meal hold scheduled meal time insulin. If eats >50% but < 75% give 1/2 dose of scheduled insulin, and if eats >75% then give full dose of insulin  Will restart home medications as necessary 10. CAD s/p recent CABG  Monitor in accordance with increased physical activity and avoid UE resistance excercises 11. OSA  Cont O2 at night  Evaluate signs of daytime somnolence. 12. AS  See CAD 13. Anemia  Hb 8.6 on 12/23  Will periodically monitor Hb to ensure adequate perfusion and energy for therapies  Will transfuse if pt becomes symptomatic and/or Hb <7.0 14. Spastic bladder  Cont Ditropan 15. GERD  Cont Protonix 16. Hypokalemia   Cont K+ supplement  K+ 3.5 on 12/23  Will monitor periodically 17. COPD  Cont Spiriva  Post Admission Physician Evaluation: 1. Functional deficits secondary  to left medulla embolic  infarct. 2. Patient is admitted to receive collaborative, interdisciplinary care between the physiatrist, rehab nursing staff, and therapy team. 3. Patient's level of medical complexity and substantial therapy needs in context of that medical necessity cannot be provided at a lesser intensity of care such as a SNF. 4. Patient has experienced substantial functional loss from his/her baseline which was documented above under the "Functional History" and "Functional Status" headings.  Judging by the patient's diagnosis, physical exam, and functional history, the patient has potential for functional progress which will result in measurable gains while on inpatient rehab.  These gains will be of substantial and practical use upon discharge  in facilitating mobility and self-care at the household level. 5. Physiatrist will provide 24 hour management of medical needs as well as oversight of the therapy plan/treatment and provide guidance as appropriate regarding the interaction of the two. 6. 24 hour rehab nursing will assist with safety, disease management, medication administration and patient education and help integrate therapy concepts, techniques,education, etc. 7. PT will assess and treat for/with: Lower extremity strength, range of motion, stamina, balance, functional mobility, safety, adaptive techniques and equipment, coping skills, pain control, stroke education.   Goals are: Mod I/Supervision. 8. OT will assess and treat for/with: ADL's, functional mobility, safety, upper extremity strength, adaptive techniques and equipment, ego support, and community reintegration.   Goals are: Mod I/Supervision. Therapy may proceed with showering this patient. 9. Case Management and Social Worker will assess and treat for psychological issues and discharge planning. 10. Team conference will be held weekly to assess progress toward goals and to determine barriers to discharge. 11. Patient will receive at least 3  hours of therapy per day at least 5 days per week. 12. ELOS: 14-18 days.       13. Prognosis:  good  Delice Lesch, MD  12/15/2015

## 2015-12-15 NOTE — Progress Notes (Signed)
Pt d/c to rehab. Report called and pt transferred to [id number]. Assessment stable

## 2015-12-15 NOTE — Progress Notes (Signed)
Ankit Lorie Phenix, MD Physician Signed Physical Medicine and Rehabilitation Consult Note 12/14/2015 6:25 PM  Related encounter: ED to Hosp-Admission (Current) from 12/13/2015 in [place] All Collapse All        Physical Medicine and Rehabilitation Consult  Reason for Consult: Right sided weakness and right facial weakness.  Referring Physician: Dr. Landis Gandy.    HPI: Douglas Carrillo is a 81 y.o. right handed male with history of HTN, DM type2, CABG, OSA--oxygen at nights, severe aortic stenosis with recent onset of SOB and cardiac cath 12/20 with transient right arm and leg weakness post procedure likely due to embolic infarct. He was discharged to home but readmitted the next day on 12/21 am with fall out of bed due to right leg weakness and decreased sensation right half of his body. MRI brain done revealing acute infarct along ventral surface of left medulla and MRA brain/head without significant stenosis. Dr. Leonie Man consulted and felt that stroke due to sequela of cardiac cath and to continue Plavix for secondary stroke prevention. Carotid dopplers without significant ICA stenosis. Patient with resultant mild right facial droop, RLE weakness affecting balance and mobility as well as hypoxia with activity. CIR recommended for follow up therapy  Independent PTA but did get dyspneic at times with household ambulation and walking to mail box. Completed cardio-pulmonary rehab earlier this year. Daughter lives next door and works partime but available 24/7 after next week.    Review of Systems  HENT: Negative for hearing loss.  Eyes: Negative for blurred vision and double vision.  Respiratory: Positive for shortness of breath (with activity). Negative for cough and wheezing.  Cardiovascular: Negative for chest pain and palpitations.  Gastrointestinal: Positive for heartburn. Negative for nausea, abdominal pain and constipation.    Genitourinary: Negative for dysuria.  Musculoskeletal: Positive for myalgias (right lower back due to falls X 2 prior to admission). Negative for back pain and neck pain.  Skin: Negative for itching and rash.  Neurological: Positive for sensory change, speech change and focal weakness. Negative for dizziness and headaches.  Psychiatric/Behavioral: The patient is not nervous/anxious and does not have insomnia.  All other systems reviewed and are negative.     Past Medical History  Diagnosis Date  . Hypertension   . High cholesterol   . Prostate cancer ([place])   . CAD (coronary artery disease)   . OSA (obstructive sleep apnea)   . COPD (chronic obstructive pulmonary disease) ([facility])     MODERATE  . Diabetes mellitus ([place])   . GERD (gastroesophageal reflux disease)   . Vertigo   . Arthritis     OA  . Anemia     Past Surgical History  Procedure Laterality Date  . Coronary artery bypass graft  03/29/1999    x 4: LIMA to the LAD,SVG to the diagonal branch of the LAD,SVG to the intermediate coronary artery & SVG to the posterior descending branch of the RCA.  . Colon surgery      x 4  . Nm myocar perf wall motion  04/28/2012    Low Risk Scan  . Prostate surgery    . Cardiac catheterization    . Cardiac catheterization N/A 12/12/2015    Procedure: Right/Left Heart Cath and Coronary/Graft Angiography; Surgeon: Troy Sine, MD; Location: [facility]; Service: Cardiovascular; Laterality: N/A;  . Peripheral vascular catheterization N/A 12/12/2015    Procedure: Aortic Arch Angiography; Surgeon: Troy Sine, MD; Location:  [facility]; Service: Cardiovascular; Laterality: N/A;  . Eye surgery      Family History  Problem Relation Age of Onset  . Kidney disease Brother   . Breast cancer Sister   . Rheum arthritis Mother     Social History: Lives  alone. 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 He reports that he quit smoking about 31 years ago. His smoking use included Cigarettes. He has a 30 pack-year smoking history. He has never used smokeless tobacco. He reports that he drinks alcohol 1-2 times a year. He reports that he does not use illicit drugs.   Allergies  Allergen Reactions  . Mold Extract [Trichophyton] Anaphylaxis  . Lipitor [Atorvastatin] Other (See Comments)    Weakness in legs/myalgias    Medications Prior to Admission  Medication Sig Dispense Refill  . amLODipine (NORVASC) 5 MG tablet Take 1 tablet (5 mg total) by mouth daily. 90 tablet 3  . aspirin 81 MG tablet Take 81 mg by mouth daily.    . clopidogrel (PLAVIX) 75 MG tablet TAKE 1 TABLET DAILY 90 tablet 3  . isosorbide mononitrate (IMDUR) 60 MG 24 hr tablet TAKE 1 AND 1/2 TABLETS DAILY 135 tablet 0  . ketotifen (THERA TEARS ALLERGY) 0.025 % ophthalmic solution Place 1 drop into both eyes 2 (two) times daily.     Marland Kitchen KLOR-CON M20 20 MEQ tablet TAKE 3 TABLETS DAILY 270 tablet 0  . losartan-hydrochlorothiazide (HYZAAR) 100-25 MG per tablet TAKE 1 TABLET DAILY 90 tablet 3  . meclizine (ANTIVERT) 25 MG tablet Take 25 mg by mouth as needed for dizziness.    . metoprolol succinate (TOPROL-XL) 100 MG 24 hr tablet TAKE 1 TABLET (100 MG) EVERY MORNING AND TAKE 1/2 TABLET (50 MG) AT BEDTIME 135 tablet 0  . Tiotropium Bromide-Olodaterol (STIOLTO RESPIMAT) 2.5-2.5 MCG/ACT AERS Inhale 2 puffs into the lungs daily. 3 Inhaler 3  . b complex vitamins capsule Take 1 capsule by mouth daily.    . B-D ULTRAFINE III SHORT PEN 31G X 8 MM MISC     . benzonatate (TESSALON) 100 MG capsule Take 1 capsule by mouth 3 (three) times daily as needed for cough.   1  . CRESTOR 20 MG tablet Take 20 mg by mouth daily.     . furosemide  (LASIX) 40 MG tablet TAKE 1 TABLET DAILY 90 tablet 0  . GLIPIZIDE XL 10 MG 24 hr tablet Take 10 mg by mouth daily.     Marland Kitchen latanoprost (XALATAN) 0.005 % ophthalmic solution Place 1 drop into both eyes at bedtime.    . metFORMIN (GLUCOPHAGE-XR) 500 MG 24 hr tablet Take 500 mg by mouth 2 (two) times daily.     . Multiple Vitamins-Minerals (CENTRUM SILVER ULTRA MENS PO) Take 1 tablet by mouth daily.    Marland Kitchen NEXIUM 40 MG capsule Take 40 mg by mouth daily.     Glory Rosebush DELICA LANCETS [id number] MISC 1 each by Other route 2 (two) times daily. Use 1 lancet each to check glucose bid    . ONETOUCH VERIO test strip 1 strip by Other route 2 (two) times daily. Use 1 strip to check glucose bid    . oxybutynin (DITROPAN-XL) 5 MG 24 hr tablet Take 5 mg by mouth daily.     . pioglitazone (ACTOS) 45 MG tablet Take 1 tablet by mouth daily.    Marland Kitchen PROAIR HFA 108 (90 BASE) MCG/ACT inhaler Inhale into  the lungs as needed.    . RESTASIS 0.05 % ophthalmic emulsion Place 1 drop into both eyes 2 (two) times daily.    Marland Kitchen Spacer/Aero-Holding Chambers (AEROCHAMBER PLUS FLO-VU) MISC   1  . VICTOZA 18 MG/3ML SOPN Inject 1.8 mg as directed every morning.     . vitamin C (ASCORBIC ACID) 500 MG tablet Take 500 mg by mouth 2 (two) times daily.    . Vitamin D, Ergocalciferol, (DRISDOL) 50000 UNITS CAPS capsule Take 50,000 Units by mouth every 14 (fourteen) days. Every other week on Sunday    . vitamin E 400 UNIT capsule Take 400 Units by mouth daily.    Marland Kitchen ZETIA 10 MG tablet Take 10 mg by mouth daily.       Home: Home Living Family/patient expects to be discharged to:: Private residence Living Arrangements: Alone Available Help at Discharge: Family, Available 24 hours/day Type of Home: House Home Access: Stairs to enter [employer] of Steps: 2 Entrance Stairs-Rails: None Home Layout: One level Bathroom Shower/Tub: Tub/shower unit,  Architectural technologist: Standard Home Equipment: Radio producer - single point Additional Comments: Daughter lives around the corner, is a Risk manager and pt states she can provide 24/7 care if needed.  Functional History: Prior Function Level of Independence: Independent Comments: Drives, goes to the gym everyday, and like watching sports (especially college basketball) Functional Status:  Mobility: Bed Mobility Overal bed mobility: Needs Assistance Bed Mobility: Supine to Sit Rolling: Min assist Sidelying to sit: Mod assist Supine to sit: Mod assist Sit to supine: Mod assist General bed mobility comments: exit to Rt HOB flat, no rail; pt self-selected supine to sit; assist to raise torson Transfers Overall transfer level: Needs assistance Equipment used: 2 person hand held assist Transfers: Sit to/from Stand, Stand Pivot Transfers Sit to Stand: Mod assist, +2 physical assistance Stand pivot transfers: Mod assist, +2 physical assistance General transfer comment: Good weight bearing through RLE with weakness but not buckling; required assist to weight shift and advance RLE with pivot to his left Ambulation/Gait General Gait Details: pivotal steps only    ADL: ADL Overall ADL's : Needs assistance/impaired  Cognition: Cognition Overall Cognitive Status: Within Functional Limits for tasks assessed Orientation Level: Oriented X4 Cognition Arousal/Alertness: Awake/alert Behavior During Therapy: WFL for tasks assessed/performed Overall Cognitive Status: Within Functional Limits for tasks assessed   Blood pressure 145/49, pulse 76, temperature 98.6 F (37 C), temperature source Oral, resp. rate 20, height 6' (1.829 m), weight 92.987 kg (205 lb), SpO2 94 %. Physical Exam  Nursing note and vitals reviewed. Constitutional: He is oriented to person, place, and time. He appears well-developed and well-nourished.  Up in chair--appears younger than stated age.  HENT:  Head:  Normocephalic and atraumatic.  Mouth/Throat: No oropharyngeal exudate.  Eyes: Conjunctivae and EOM are normal. Pupils are equal, round, and reactive to light. No scleral icterus.  Neck: Normal range of motion. Neck supple.  Cardiovascular: Normal rate and regular rhythm.  Holosystolic murmur.  Respiratory: Effort normal and breath sounds normal. No stridor. He exhibits no tenderness.  GI: Soft. Bowel sounds are normal. He exhibits no distension. There is no tenderness.  Musculoskeletal: He exhibits edema (right ankle with trace edema). He exhibits no tenderness.  Neurological: He is alert and oriented to person, place, and time.  Right facial weakness with minimal dysarthria.  Able to follow one and two step motor commands without difficulty.  3+ DTRs RUE/RLE Sensation diminished to light touch RUE/RLE Motor: LUE/LLE: 5/5 RUE: 3+ -4-/5  proximal to distal RLE: hip flexion 2+/5, ankle dorsi/plantar flexion 2/5  Skin: Skin is warm and dry. No rash noted.  Tenderness with ecchymosis right flank.  Psychiatric: He has a normal mood and affect. His behavior is normal. Judgment and thought content normal.     Lab Results Last 24 Hours    Results for orders placed or performed during the hospital encounter of 12/13/15 (from the past 24 hour(s))  Glucose, capillary Status: Abnormal   Collection Time: 12/14/15 11:52 AM  Result Value Ref Range   Glucose-Capillary 200 (H) 65 - 99 mg/dL  Glucose, capillary Status: Abnormal   Collection Time: 12/14/15 4:12 PM  Result Value Ref Range   Glucose-Capillary 135 (H) 65 - 99 mg/dL   Comment 1 Notify RN    Comment 2 Document in Chart   Glucose, capillary Status: Abnormal   Collection Time: 12/14/15 10:10 PM  Result Value Ref Range   Glucose-Capillary 143 (H) 65 - 99 mg/dL   Comment 1 Notify RN    Comment 2 Document in Chart   CBC Status: Abnormal   Collection Time: 12/15/15  4:10 AM  Result Value Ref Range   WBC 4.9 4.0 - 10.5 K/uL   RBC 3.02 (L) 4.22 - 5.81 MIL/uL   Hemoglobin 8.6 (L) 13.0 - 17.0 g/dL   HCT 27.3 (L) 39.0 - 52.0 %   MCV 90.4 78.0 - 100.0 fL   MCH 28.5 26.0 - 34.0 pg   MCHC 31.5 30.0 - 36.0 g/dL   RDW 18.6 (H) 11.5 - 15.5 %   Platelets 176 150 - 400 K/uL  Basic metabolic panel Status: Abnormal   Collection Time: 12/15/15 4:10 AM  Result Value Ref Range   Sodium 141 135 - 145 mmol/L   Potassium 3.5 3.5 - 5.1 mmol/L   Chloride 109 101 - 111 mmol/L   CO2 24 22 - 32 mmol/L   Glucose, Bld 148 (H) 65 - 99 mg/dL   BUN 16 6 - 20 mg/dL   Creatinine, Ser 1.18 0.61 - 1.24 mg/dL   Calcium 8.7 (L) 8.9 - 10.3 mg/dL   GFR calc non Af Amer 54 (L) >60 mL/min   GFR calc Af Amer >60 >60 mL/min   Anion gap 8 5 - 15  Glucose, capillary Status: Abnormal   Collection Time: 12/15/15 7:12 AM  Result Value Ref Range   Glucose-Capillary 157 (H) 65 - 99 mg/dL   Comment 1 Notify RN    Comment 2 Document in Chart       Imaging Results (Last 48 hours)    Mr Jodene Nam Head Wo Contrast  12/13/2015 CLINICAL DATA: Cardiac catheterization yesterday. Awoke with right-sided arm and leg weakness. EXAM: MRI HEAD WITHOUT AND WITH CONTRAST MRA HEAD WITHOUT CONTRAST MRA NECK WITHOUT AND WITH CONTRAST TECHNIQUE: Multiplanar, multiecho pulse sequences of the brain and surrounding structures were obtained without and with intravenous contrast. Angiographic images of the Circle of Willis were obtained using MRA technique without intravenous contrast. Angiographic images of the neck were obtained using MRA technique without and with intravenous contrast. Carotid stenosis measurements (when applicable) are obtained utilizing NASCET criteria, using the distal internal carotid diameter as the denominator. CONTRAST: 33mL MULTIHANCE GADOBENATE DIMEGLUMINE 529 MG/ML IV SOLN  COMPARISON: CT same day FINDINGS: MRI HEAD FINDINGS Diffusion imaging shows acute infarction within the ventral surface of the left medulla. No other acute infarction. Elsewhere the brainstem and cerebellum are normal. The cerebral hemispheres show mild chronic small-vessel ischemic changes of the white  matter, fairly typical for age. Small focus of hemosiderin deposition in the right parietal deep white matter. No cortical or large vessel territory infarction. No mass lesion, acute hemorrhage, hydrocephalus or extra-axial collection. No pituitary mass. No inflammatory sinus disease. Small mastoid effusions bilaterally. After contrast administration, no abnormal enhancement of the brain occurs. MRA HEAD FINDINGS Both internal carotid arteries are widely patent into the brain. The anterior and middle cerebral vessels are patent without proximal stenosis, aneurysm or vascular malformation. Both vertebral arteries are patent with the right being dominant. No basilar stenosis. Anterior inferior cerebellar arteries, superior cerebellar arteries and posterior cerebral arteries appear patent and normal. Posterior inferior cerebellar arteries not demonstrated, possibly congenitally hypoplastic or aplastic. MRA NECK FINDINGS Branching pattern of the brachiocephalic vessels from the arch is normal. No origin stenoses. Both common carotid arteries widely patent to their respective bifurcation. Mild atherosclerotic disease at both carotid bifurcations but no stenosis of the internal carotid artery on either side. 50% stenosis of the external carotid artery on the right. Vertebral artery origins are not well seen because of physiologic motion in the chest. I think the left vertebral artery arises from the arch. In the neck, both vertebral arteries are widely patent without focal stenosis. IMPRESSION: Acute infarction along the ventral surface of the left medulla. No hemorrhage or swelling. Mild chronic small-vessel ischemic  changes elsewhere affecting the brain, fairly typical for age. No carotid bifurcation stenosis other than 50% narrowing of the external carotid artery on the right. Vertebral artery origins can't be accurately evaluated because of physiologic motion. Small left vertebral artery arises directly from the arch. Vertebral arteries both widely patent in the neck to the basilar. Electronically Signed By: Nelson Chimes M.D. On: 12/13/2015 15:54   Mr Angiogram Neck W Wo Contrast  12/13/2015 CLINICAL DATA: Cardiac catheterization yesterday. Awoke with right-sided arm and leg weakness. EXAM: MRI HEAD WITHOUT AND WITH CONTRAST MRA HEAD WITHOUT CONTRAST MRA NECK WITHOUT AND WITH CONTRAST TECHNIQUE: Multiplanar, multiecho pulse sequences of the brain and surrounding structures were obtained without and with intravenous contrast. Angiographic images of the Circle of Willis were obtained using MRA technique without intravenous contrast. Angiographic images of the neck were obtained using MRA technique without and with intravenous contrast. Carotid stenosis measurements (when applicable) are obtained utilizing NASCET criteria, using the distal internal carotid diameter as the denominator. CONTRAST: 48mL MULTIHANCE GADOBENATE DIMEGLUMINE 529 MG/ML IV SOLN COMPARISON: CT same day FINDINGS: MRI HEAD FINDINGS Diffusion imaging shows acute infarction within the ventral surface of the left medulla. No other acute infarction. Elsewhere the brainstem and cerebellum are normal. The cerebral hemispheres show mild chronic small-vessel ischemic changes of the white matter, fairly typical for age. Small focus of hemosiderin deposition in the right parietal deep white matter. No cortical or large vessel territory infarction. No mass lesion, acute hemorrhage, hydrocephalus or extra-axial collection. No pituitary mass. No inflammatory sinus disease. Small mastoid effusions bilaterally. After contrast administration, no abnormal  enhancement of the brain occurs. MRA HEAD FINDINGS Both internal carotid arteries are widely patent into the brain. The anterior and middle cerebral vessels are patent without proximal stenosis, aneurysm or vascular malformation. Both vertebral arteries are patent with the right being dominant. No basilar stenosis. Anterior inferior cerebellar arteries, superior cerebellar arteries and posterior cerebral arteries appear patent and normal. Posterior inferior cerebellar arteries not demonstrated, possibly congenitally hypoplastic or aplastic. MRA NECK FINDINGS Branching pattern of the brachiocephalic vessels from the arch is normal. No origin stenoses. Both common carotid arteries widely  patent to their respective bifurcation. Mild atherosclerotic disease at both carotid bifurcations but no stenosis of the internal carotid artery on either side. 50% stenosis of the external carotid artery on the right. Vertebral artery origins are not well seen because of physiologic motion in the chest. I think the left vertebral artery arises from the arch. In the neck, both vertebral arteries are widely patent without focal stenosis. IMPRESSION: Acute infarction along the ventral surface of the left medulla. No hemorrhage or swelling. Mild chronic small-vessel ischemic changes elsewhere affecting the brain, fairly typical for age. No carotid bifurcation stenosis other than 50% narrowing of the external carotid artery on the right. Vertebral artery origins can't be accurately evaluated because of physiologic motion. Small left vertebral artery arises directly from the arch. Vertebral arteries both widely patent in the neck to the basilar. Electronically Signed By: Nelson Chimes M.D. On: 12/13/2015 15:54   Mr Jeri Cos [id number] Contrast  12/13/2015 CLINICAL DATA: Cardiac catheterization yesterday. Awoke with right-sided arm and leg weakness. EXAM: MRI HEAD WITHOUT AND WITH CONTRAST MRA HEAD WITHOUT CONTRAST MRA NECK WITHOUT AND WITH  CONTRAST TECHNIQUE: Multiplanar, multiecho pulse sequences of the brain and surrounding structures were obtained without and with intravenous contrast. Angiographic images of the Circle of Willis were obtained using MRA technique without intravenous contrast. Angiographic images of the neck were obtained using MRA technique without and with intravenous contrast. Carotid stenosis measurements (when applicable) are obtained utilizing NASCET criteria, using the distal internal carotid diameter as the denominator. CONTRAST: 21mL MULTIHANCE GADOBENATE DIMEGLUMINE 529 MG/ML IV SOLN COMPARISON: CT same day FINDINGS: MRI HEAD FINDINGS Diffusion imaging shows acute infarction within the ventral surface of the left medulla. No other acute infarction. Elsewhere the brainstem and cerebellum are normal. The cerebral hemispheres show mild chronic small-vessel ischemic changes of the white matter, fairly typical for age. Small focus of hemosiderin deposition in the right parietal deep white matter. No cortical or large vessel territory infarction. No mass lesion, acute hemorrhage, hydrocephalus or extra-axial collection. No pituitary mass. No inflammatory sinus disease. Small mastoid effusions bilaterally. After contrast administration, no abnormal enhancement of the brain occurs. MRA HEAD FINDINGS Both internal carotid arteries are widely patent into the brain. The anterior and middle cerebral vessels are patent without proximal stenosis, aneurysm or vascular malformation. Both vertebral arteries are patent with the right being dominant. No basilar stenosis. Anterior inferior cerebellar arteries, superior cerebellar arteries and posterior cerebral arteries appear patent and normal. Posterior inferior cerebellar arteries not demonstrated, possibly congenitally hypoplastic or aplastic. MRA NECK FINDINGS Branching pattern of the brachiocephalic vessels from the arch is normal. No origin stenoses. Both common carotid arteries  widely patent to their respective bifurcation. Mild atherosclerotic disease at both carotid bifurcations but no stenosis of the internal carotid artery on either side. 50% stenosis of the external carotid artery on the right. Vertebral artery origins are not well seen because of physiologic motion in the chest. I think the left vertebral artery arises from the arch. In the neck, both vertebral arteries are widely patent without focal stenosis. IMPRESSION: Acute infarction along the ventral surface of the left medulla. No hemorrhage or swelling. Mild chronic small-vessel ischemic changes elsewhere affecting the brain, fairly typical for age. No carotid bifurcation stenosis other than 50% narrowing of the external carotid artery on the right. Vertebral artery origins can't be accurately evaluated because of physiologic motion. Small left vertebral artery arises directly from the arch. Vertebral arteries both widely patent in the neck to the basilar.  Electronically Signed By: Nelson Chimes M.D. On: 12/13/2015 15:54     Assessment/Plan: Diagnosis: Left medulla embolic infarct Labs and images independently reviewed. Records reviewed and summated above. Stroke: Continue secondary stroke prophylaxis and Risk Factor Modification listed below:  Antiplatelet therapy Blood Pressure Management: Continue current medication with prn's with permisive HTN per primary team Statin Agent Diabetes management Right sided hemiparesis: fit for orthotics to prevent contractures (PRAFO, etc) Motor recovery: Fluoxetine  1. Does the need for close, 24 hr/day medical supervision in concert with the patient's rehab needs make it unreasonable for this patient to be served in a less intensive setting? Yes  2. Co-Morbidities requiring supervision/potential complications: HTN (monitor and provide prns in accordance with increased physical exertion and pain), DM type2 (Monitor in accordance with exercise and adjust meds  as necessary), CAD s/p recent CABG (Monitor in accordance with increased physical activity and avoid UE resistance excercises), OSA--oxygen at nights (Evaluate signs of daytime somnolence), severe aortic stenosis with recent onset of SOB (see CAD), anemia (transfuse if necessary to ensure appropriate perfusion for increased activity tolerance) 3. Due to safety, disease management, medication administration and patient education, does the patient require 24 hr/day rehab nursing? Yes 4. Does the patient require coordinated care of a physician, rehab nurse, PT (1-2 hrs/day, 5 days/week) and OT (1-2 hrs/day, 5 days/week) to address physical and functional deficits in the context of the above medical diagnosis(es)? Yes Addressing deficits in the following areas: balance, endurance, locomotion, strength, transferring, dressing, grooming, toileting and psychosocial support 5. Can the patient actively participate in an intensive therapy program of at least 3 hrs of therapy per day at least 5 days per week? Yes 6. The potential for patient to make measurable gains while on inpatient rehab is excellent 7. Anticipated functional outcomes upon discharge from inpatient rehab are modified independent and supervision with PT, modified independent and supervision with OT, n/a with SLP. 8. Estimated rehab length of stay to reach the above functional goals is: 14-18 days. 9. Does the patient have adequate social supports and living environment to accommodate these discharge functional goals? Yes 10. Anticipated D/C setting: Home 11. Anticipated post D/C treatments: HH therapy and Home excercise program 12. Overall Rehab/Functional Prognosis: good  RECOMMENDATIONS: This patient's condition is appropriate for continued rehabilitative care in the following setting: CIR Patient has agreed to participate in recommended program. Yes Note that insurance prior authorization may be required for reimbursement for recommended  care.  Comment: Rehab Admissions Coordinator to follow up.  Delice Lesch, MD 12/15/2015       Revision History     Date/Time User Provider Type Action   12/15/2015 1:02 PM Ankit Lorie Phenix, MD Physician Sign   12/15/2015 11:02 AM Bary Leriche, PA-C Physician Assistant Share   12/15/2015 11:00 AM Bary Leriche, PA-C Physician Assistant Share   View Details Report       Routing History     Date/Time From To Method   12/15/2015 1:02 PM Ankit Lorie Phenix, MD Ankit Lorie Phenix, MD In [facility]   12/15/2015 1:02 PM Ankit Lorie Phenix, MD Lorne Skeens, MD Fax

## 2015-12-15 NOTE — Progress Notes (Signed)
Physical Therapy Treatment Patient Details Name: Douglas Carrillo MRN: [id number] DOB: 1928-08-22 Today's Date: 12/15/2015    History of Present Illness 81 y.o. male admitted for R side weakness resulting in 2 falls on 12/13/15. MRI (+) for L medulla infarct. Pt is s/p a cardiac cath performed on 12/12/15. PMH significant for HTN, DM, prostate cancer, CAD, CABG x4, dyspnea on exertion, OSA.     PT Comments    Patient with improved movement/strength RLE--movements are delayed and incr effort. Was able to ambulate with RW and 2 person assist. Pt in/out of bigeminy (daughter/pt report this is normal for him; HR 106-110) and SaO2 down to 88% on RA. On 2L SaO2 >93% with activity.   Follow Up Recommendations  CIR     Equipment Recommendations   (TBA)    Recommendations for Other Services       Precautions / Restrictions Precautions Precautions: Fall    Mobility  Bed Mobility                  Transfers Overall transfer level: Needs assistance Equipment used: Rolling walker (2 wheeled) Transfers: Sit to/from Stand Sit to Stand: Min assist;Min guard         General transfer comment: Lt side tends to overpower and push him to his Rt; as fatigued required slightly more assist  Ambulation/Gait Ambulation/Gait assistance: Mod assist;+2 safety/equipment Ambulation Distance (Feet): 16 Feet (seated rest; 25) Assistive device: Rolling walker (2 wheeled) Gait Pattern/deviations: Step-through pattern;Decreased step length - right;Decreased step length - left;Decreased stance time - right;Decreased dorsiflexion - right;Steppage (knee hyperextends with fatigue)   Gait velocity interpretation: Below normal speed for age/gender     Stairs            Wheelchair Mobility    Modified Rankin (Stroke Patients Only) Modified Rankin (Stroke Patients Only) Pre-Morbid Rankin Score: No symptoms Modified Rankin: Moderately severe disability     Balance   Sitting-balance  support: Bilateral upper extremity supported Sitting balance-Leahy Scale: Fair Sitting balance - Comments: EOC   Standing balance support: Bilateral upper extremity supported Standing balance-Leahy Scale: Poor                      Cognition Arousal/Alertness: Awake/alert Behavior During Therapy: WFL for tasks assessed/performed Overall Cognitive Status: Within Functional Limits for tasks assessed                      Exercises General Exercises - Lower Extremity Ankle Circles/Pumps: AROM;Right;5 reps Long Arc Quad: AROM;Right;5 reps Hip Flexion/Marching: AROM;Right;5 reps    General Comments        Pertinent Vitals/Pain Pain Assessment: Faces Faces Pain Scale: Hurts even more Pain Location: Rt lower ribs Pain Descriptors / Indicators: Grimacing;Sharp;Tender Pain Intervention(s): Limited activity within patient's tolerance;Monitored during session;Repositioned;Other (comment) (moved gait belt and guided via Rt humerus and Rt hip)    Home Living                      Prior Function            PT Goals (current goals can now be found in the care plan section) Acute Rehab PT Goals Patient Stated Goal: to regain walking; back to gym Time For Goal Achievement: 12/21/15 Progress towards PT goals: Progressing toward goals    Frequency  Min 4X/week    PT Plan Current plan remains appropriate    Co-evaluation  End of Session Equipment Utilized During Treatment: Gait belt;Oxygen Activity Tolerance: Patient tolerated treatment well Patient left: in chair;with call bell/phone within reach;with chair alarm set;with family/visitor present     Time: [id number] PT Time Calculation (min) (ACUTE ONLY): 27 min  Charges:  $Gait Training: 23-37 mins                    G Codes:      Sonu Kruckenberg 12-22-15, 11:50 AM Pager [phone]

## 2015-12-15 NOTE — Progress Notes (Signed)
[name], RN Rehab Admission Coordinator Signed Physical Medicine and Rehabilitation PMR Pre-admission 12/15/2015 1:54 PM  Related encounter: ED to Hosp-Admission (Current) from 12/13/2015 in [place] Collapse All   PMR Admission Coordinator Pre-Admission Assessment  Patient: Douglas Carrillo is an 81 y.o., male MRN: [id number] DOB: 04-Oct-1928 Height: 6' (182.9 cm) Weight: 92.987 kg (205 lb)  Insurance Information HMO: PPO: PCP: IPA: 80/20: OTHER:  PRIMARY: Medicare A/B Policy#: [id number] Subscriber: Phil Dopp CM Name: Phone#: Fax#:  Pre-Cert#: Employer: Retired Benefits: Phone #: Name: Checked in [place]. Date: 06/23/95 Deduct: $1288 Out of Pocket Max: none Life Max: unlimited CIR: 100% SNF: 100 days Outpatient: 80% Co-Pay: 20% Home Health: 100% Co-Pay: none DME: 80% Co-Pay: 20% Providers: patient's choice  SECONDARY: UHC options Policy#: [id number] Subscriber: Phil Dopp CM Name: Phone#: Fax#:  Pre-Cert#: Employer: Retired Benefits: Phone #: [phone] Name:  Eff. Date: Deduct: Out of Pocket Max: Life Max:  CIR: SNF:  Outpatient: Co-Pay:  Home Health: Co-Pay:  DME: Co-Pay:   Tertiary: Tricare for Life Policy#: [id number] Subscriber: [place]arrell [place] Benefits: Phone #: [phone]  Emergency [place]    Name Relation Home Work Mobile   Wen,Kim Daughter [phone]       Current Medical History  Patient Admitting Diagnosis: Left medulla embolic infarct  History of Present Illness: An 81 y.o.  right handed male with history of HTN, DM type2, CABG, OSA--oxygen at nights, severe aortic stenosis with recent onset of SOB and cardiac cath 12/20 with transient right arm and leg weakness post procedure likely due to embolic infarct. He was discharged to home but readmitted the next day on 12/21 am with fall out of bed due to right leg weakness and decreased sensation right half of his body. MRI brain done revealing acute infarct along ventral surface of left medulla and MRA brain/head without significant stenosis. Dr. Leonie Man consulted and felt that stroke due to sequela of cardiac cath and to continue Plavix for secondary stroke prevention. Carotid dopplers without significant ICA stenosis. Patient with resultant mild right facial droop, RLE weakness affecting balance and mobility as well as hypoxia with activity. CIR recommended for follow up therapy  Independent PTA but did get dyspneic at times with household ambulation and walking to mail box. Completed cardio-pulmonary rehab earlier this year. Daughter lives around the corner (within 1 minute) and works partime/PRN as a Marine scientist but available 24/7 after next week.    Total: 3=NIH  Past Medical History  Past Medical History  Diagnosis Date  . Hypertension   . High cholesterol   . Prostate cancer ([place])   . CAD (coronary artery disease)   . OSA (obstructive sleep apnea)   . COPD (chronic obstructive pulmonary disease) (HCC)     MODERATE  . Diabetes mellitus ([place])   . GERD (gastroesophageal reflux disease)   . Vertigo   . Arthritis     OA  . Anemia     Family History  family history includes Breast cancer in his sister; Kidney disease in his brother; Rheum arthritis in his mother.  Prior Rehab/Hospitalizations: Had pulmonary rehab which completed this past 08/16.  Has the patient had major surgery during 100 days prior to admission? No  Current Medications   Current  facility-administered medications:  . 0.9 % sodium chloride infusion, 250 mL, Intravenous, PRN, Willia Craze, NP . acetaminophen (TYLENOL) tablet 650 mg, 650 mg, Oral, Q6H PRN, 650 mg at  12/15/15 1031 **OR** acetaminophen (TYLENOL) suppository 650 mg, 650 mg, Rectal, Q6H PRN, Willia Craze, NP . albuterol (PROVENTIL) (2.5 MG/3ML) 0.083% nebulizer solution 3 mL, 3 mL, Inhalation, Q4H PRN, Willia Craze, NP, 3 mL at 12/15/15 0747 . aspirin chewable tablet 81 mg, 81 mg, Oral, Daily, Albertine Patricia, MD, 81 mg at 12/15/15 1031 . clopidogrel (PLAVIX) tablet 75 mg, 75 mg, Oral, Daily, Willia Craze, NP, 75 mg at 12/15/15 1031 . cycloSPORINE (RESTASIS) 0.05 % ophthalmic emulsion 1 drop, 1 drop, Both Eyes, BID, Willia Craze, NP, 1 drop at 12/15/15 1032 . enoxaparin (LOVENOX) injection 40 mg, 40 mg, Subcutaneous, Q24H, Willia Craze, NP, 40 mg at 12/15/15 1156 . furosemide (LASIX) tablet 40 mg, 40 mg, Oral, Daily, Willia Craze, NP, 40 mg at 12/15/15 1031 . HYDROcodone-acetaminophen (NORCO/VICODIN) 5-325 MG per tablet 1-2 tablet, 1-2 tablet, Oral, Q4H PRN, Willia Craze, NP . insulin aspart (novoLOG) injection 0-9 Units, 0-9 Units, Subcutaneous, TID WC, Willia Craze, NP, 2 Units at 12/15/15 1156 . latanoprost (XALATAN) 0.005 % ophthalmic solution 1 drop, 1 drop, Both Eyes, QHS, Willia Craze, NP, 1 drop at 12/14/15 2107 . Liraglutide SOPN 1.8 mg, 1.8 mg, Injection, BH-q7a, Willia Craze, NP, 1.8 mg at 12/15/15 1249 . ondansetron (ZOFRAN) tablet 4 mg, 4 mg, Oral, Q6H PRN **OR** ondansetron (ZOFRAN) injection 4 mg, 4 mg, Intravenous, Q6H PRN, Willia Craze, NP . oxybutynin (DITROPAN-XL) 24 hr tablet 5 mg, 5 mg, Oral, Daily, Willia Craze, NP, 5 mg at 12/15/15 1032 . pantoprazole (PROTONIX) EC tablet 40 mg, 40 mg, Oral, Daily, Willia Craze, NP, 40 mg at 12/15/15 1031 . potassium chloride SA (K-DUR,KLOR-CON) CR tablet 60 mEq, 60 mEq, Oral, Daily,  Willia Craze, NP, 60 mEq at 12/15/15 1031 . rosuvastatin (CRESTOR) tablet 20 mg, 20 mg, Oral, Daily, Willia Craze, NP, 20 mg at 12/15/15 1031 . senna-docusate (Senokot-S) tablet 1 tablet, 1 tablet, Oral, QHS PRN, Willia Craze, NP . sodium chloride 0.9 % injection 3 mL, 3 mL, Intravenous, Q12H, Willia Craze, NP, 3 mL at 12/14/15 2110 . sodium chloride 0.9 % injection 3 mL, 3 mL, Intravenous, PRN, Willia Craze, NP . tiotropium [facility]) inhalation capsule 18 mcg, 18 mcg, Inhalation, Daily, Waldemar Dickens, MD, 18 mcg at 12/15/15 [id number]  Patients Current Diet: Diet heart healthy/carb modified Room service appropriate?: Yes; Fluid consistency:: Thin  Precautions / Restrictions Precautions Precautions: Fall Restrictions Weight Bearing Restrictions: No   Has the patient had 2 or more falls or a fall with injury in the past year?Yes. Has fallen twice in his home with bruising on back.  Prior Activity Level Community (5-7x/wk): Went out 6 days a week. Likes to walk at [place] on Sunday mornings.  Home Assistive Devices / Equipment Home Assistive Devices/Equipment: Eyeglasses, CBG Meter Home Equipment: Kasandra Knudsen - single point  Prior Device Use: Indicate devices/aids used by the patient prior to current illness, exacerbation or injury? None  Prior Functional Level Prior Function Level of Independence: Independent Comments: Drives, goes to the gym everyday, and like watching sports (especially college basketball)  Self Care: Did the patient need help bathing, dressing, using the toilet or eating? Independent. He reports that daughter cooks for him sometimes.  Indoor Mobility: Did the patient need assistance with walking from room to room (with or without device)? Independent  Stairs: Did the patient need assistance with internal or external stairs (with or without device)? Independent  Functional Cognition:  Did the patient need help planning regular tasks such as  shopping or remembering to take medications? Independent  Current Functional Level Cognition  Overall Cognitive Status: Within Functional Limits for tasks assessed Orientation Level: Oriented X4   Extremity Assessment (includes Sensation/Coordination)  Upper Extremity Assessment: Defer to OT evaluation RUE Deficits / Details: STRENGTH: shoulder 2-/5, biceps/triceps 3/5, decreased grip strength; AROM shoulder 50 degrees, elbow WFL, wrist flexion 60 degrees, wrist extension 30 degrees , forearm pronation WFL, forearm supination 60 degrees, unable to achieve full extension of digits 4 and 5; SENSATION: decreased light touch/numbness from elbow down, more severe distally RUE Sensation: decreased light touch, decreased proprioception RUE Coordination: decreased fine motor, decreased gross motor  Lower Extremity Assessment: RLE deficits/detail RLE Deficits / Details: hip flexion 2+, knee extension 2+, DF 0, toe extension 2+ RLE Sensation: decreased light touch RLE Coordination: decreased gross motor    ADLs  Overall ADL's : Needs assistance/impaired    Mobility  Overal bed mobility: Needs Assistance Bed Mobility: Supine to Sit Rolling: Min assist Sidelying to sit: Mod assist Supine to sit: Mod assist Sit to supine: Mod assist General bed mobility comments: exit to Rt HOB flat, no rail; pt self-selected supine to sit; assist to raise torson    Transfers  Overall transfer level: Needs assistance Equipment used: Rolling walker (2 wheeled) Transfers: Sit to/from Stand Sit to Stand: Min assist, Min guard Stand pivot transfers: Mod assist, +2 physical assistance General transfer comment: Lt side tends to overpower and push him to his Rt; as fatigued required slightly more assist    Ambulation / Gait / Stairs / Wheelchair Mobility  Ambulation/Gait Ambulation/Gait assistance: Mod assist, +2 safety/equipment Ambulation Distance (Feet): 16 Feet (seated rest; 25) Assistive  device: Rolling walker (2 wheeled) Gait Pattern/deviations: Step-through pattern, Decreased step length - right, Decreased step length - left, Decreased stance time - right, Decreased dorsiflexion - right, Steppage (knee hyperextends with fatigue) General Gait Details: pivotal steps only Gait velocity interpretation: Below normal speed for age/gender    Posture / Balance Dynamic Sitting Balance Sitting balance - Comments: EOC Balance Overall balance assessment: Needs assistance Sitting-balance support: Bilateral upper extremity supported Sitting balance-Leahy Scale: Fair Sitting balance - Comments: EOC Postural control: Posterior lean, Right lateral lean Standing balance support: Bilateral upper extremity supported Standing balance-Leahy Scale: Poor Standing balance comment: hyperextends RLE    Special needs/care consideration BiPAP/CPAP No CPM No Continuous Drip IV NO Dialysis No  Life Vest No Oxygen Yes, currently on 02 [place], also uses 02 at night at home at 2L [place] Special Bed No Trach Size No Wound Vac (area) No  Skin Has bruising on right back and right groin  Bowel mgmt: Last BM 12/15/15 Bladder mgmt: Voiding in urinal, has had 2 accidents in past couple days Diabetic mgmt Yes, on oral medications and on Victoza at home    Previous Home Environment Living Arrangements: Alone Available Help at Discharge: Family, Available 24 hours/day Type of Home: House Home Layout: One level Home Access: Stairs to enter Entrance Stairs-Rails: None Entrance Stairs-Number of Steps: 2 Bathroom Shower/Tub: Tub/shower unit, Architectural technologist: Standard Home Care Services: No Additional Comments: Daughter lives around the corner, is a semi-retired Therapist, sports and pt states she can provide 24/7 care if needed.  Discharge Living Setting Plans for Discharge Living Setting: Patient's home, Alone, House (Lives alone.) Type of Home at Discharge:  House Discharge Home Layout: One level Discharge Home Access: Stairs to enter Entrance Stairs-Number of Steps: 2 Does the  patient have any problems obtaining your medications?: No  Social/Family/Support Systems Patient Roles: Parent, Other (Comment) (Has a daughter and a granddaughter.) Contact Information: [name] - daughter Anticipated Caregiver: Dtr and self Anticipated Caregiver's Contact Information: [name] [phone] Ability/Limitations of Caregiver: Dtr is a Marine scientist and works PRN 12 hr shifts. She lives within 1 minute of patient. Caregiver Availability: Intermittent Discharge Plan Discussed with Primary Caregiver: Yes Is Caregiver In Agreement with Plan?: Yes Does Caregiver/Family have Issues with Lodging/Transportation while Pt is in Rehab?: No  Goals/Additional Needs Patient/Family Goal for Rehab: PT/OT mod I and supervision goals Expected length of stay: 14-18 days Cultural Considerations: Presbyterian Dietary Needs: Heart healthy, carb mod, thin liquids diet Equipment Needs: TBD Pt/Family Agrees to Admission and willing to participate: Yes Program Orientation Provided & Reviewed with Pt/Caregiver Including Roles & Responsibilities: Yes  Decrease burden of Care through IP rehab admission: N/A  Possible need for SNF placement upon discharge: Not planned  Patient Condition: This patient's condition remains as documented in the consult dated 12/15/15, in which the Rehabilitation Physician determined and documented that the patient's condition is appropriate for intensive rehabilitative care in an inpatient rehabilitation facility. Will admit to inpatient rehab today.  Preadmission Screen Completed By: [name], 12/15/2015 1:54 PM ______________________________________________________________________  Discussed status with Dr. Posey Pronto on 12/15/15 at 1402 and received telephone approval for admission today.  Admission Coordinator: [name],  time1402/Date12/23/16          Cosigned by: Ankit Lorie Phenix, MD at 12/15/2015 2:28 PM  Revision History     Date/Time User Provider Type Action   12/15/2015 2:28 PM Ankit Lorie Phenix, MD Physician Cosign   12/15/2015 2:12 PM [name], RN Rehab Admission Coordinator Sign

## 2015-12-15 NOTE — Discharge Summary (Signed)
Douglas Carrillo, is a 81 y.o. male  DOB December 23, 1928  MRN [id number].  Admission date:  12/13/2015  Admitting Physician  Waldemar Dickens, MD  Discharge Date:  12/15/2015   Primary MD  Limmie Patricia, MD  Recommendations for accepting physician for things to follow:  - Check CBC, BMP in 48 hours. - ON  oxygen at nighttime  Admission Diagnosis  Right sided weakness [M62.89] Cerebral infarction due to unspecified mechanism [I63.9]   Discharge Diagnosis  Right sided weakness [M62.89] Cerebral infarction due to unspecified mechanism [I63.9]   Active Problems:   CAD (coronary artery disease)   Essential hypertension   DM2 (diabetes mellitus, type 2) (HCC)   Aortic valve stenosis   COPD, moderate (HCC)   OSA (obstructive sleep apnea)   CVA (cerebral infarction)   Prolonged Q-T interval on ECG   Right sided weakness   Elevated troponin   Acute kidney failure ([place])   Normocytic anemia      Past Medical History  Diagnosis Date  . Hypertension   . High cholesterol   . Prostate cancer ([place])   . CAD (coronary artery disease)   . OSA (obstructive sleep apnea)   . COPD (chronic obstructive pulmonary disease) (HCC)     MODERATE  . Diabetes mellitus ([place])   . GERD (gastroesophageal reflux disease)   . Vertigo   . Arthritis     OA  . Anemia     Past Surgical History  Procedure Laterality Date  . Coronary artery bypass graft  03/29/1999    x 4:  LIMA to the LAD,SVG to the diagonal branch of the LAD,SVG to the intermediate coronary artery & SVG to the posterior descending branch of the RCA.  . Colon surgery      x 4  . Nm myocar perf wall motion  04/28/2012    Low Risk Scan  . Prostate surgery    . Cardiac catheterization    . Cardiac catheterization N/A 12/12/2015    Procedure: Right/Left Heart Cath and Coronary/Graft Angiography;  Surgeon: Troy Sine, MD;  Location: [facility];   Service: Cardiovascular;  Laterality: N/A;  . Peripheral vascular catheterization N/A 12/12/2015    Procedure: Aortic Arch Angiography;  Surgeon: Troy Sine, MD;  Location: [facility];  Service: Cardiovascular;  Laterality: N/A;  . Eye surgery         History of present illness and  Hospital Course:     Kindly see H&P for history of present illness and admission details, please review complete Labs, Consult reports and Test reports for all details in brief  HPI  from the history and physical done on the day of admission 12/13/2015 HPI: Douglas Carrillo is a 81 y.o. male with a past medical history not limited to hypertension, diabetes, obstructive sleep apnea, aortic stenosis , and coronary artery disease status post CABG. Patient has been having problems with dyspnea at home. Most recent echocardiogram showed progression of the aortic stenosis which Cardiology felt was likely source  of dyspnea. To reassess grafts patient underwent cardiac catheterization yesterday. The grafts were patent 16 years post revascularization. There as progressive aortic valve stenosis and was moderately severe LV dysfunction with global hypokinesis and an ejection fraction of 30-35%. Patient was referred to Cardiothoracic surgery for consideration of AVR / TAVR.   Upon arriving home last night patient noticed mild numbness in both of his feet. He was able to ambulate however. When patient awoke this morning and tried to get out of bed he fell because of right lower extremity weakness. He complains of decreased sensation in whole right side of body. No slurred speech. No confusion. No loss of bowel or bladder function.    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 - Likely due to embolization during cardiac cath procedure. - Patient presents with mild right upper extremity hemiparesis, more severe right lower extremity hemiparesis, - MRI of the brain showing left medullary  infarcts, most likely secondary to small vessel disease,  - MRA head and neck with no current acute stenosis,  - 2-D echo done on 11/24/2015 with no source of embolus, - LDL is 53, patient is already on Crestor and Zetia, - Continue with aspirin and Plavix - To be discharged for CIR  Hypertension -Held during hospital stay to allow for permissive hypertension, she is back on Hyzaar, beta blockers, and Imdur, amlodipine has been held, and be resumed in couple days given his recent CVA.  Hyperlipidemia - LDL is 53, patient is on Crestor and Zetia  Obstructive sleep apnea - On home oxygen, C Pap not recommended  Coronary artery disease, status post CABG - Cardiac  catheter done on 12/20, with patent grafts - Continue with aspirin, Plavix, statin,  Hyzaar and beta blocker and Imdur  Diabetes mellitus -  old oral hypoglycemic agent on discharge, continue with Victoza and insulin sliding scale.  Elevated troponin - Likely due to acute CVA, recent cardiac cath with patent graft  Aortic valve stenosis, moderately severe. -To see Dr. Cyndia Bent for evaluation of AVR candidacy with possibility of TAVR.  Anemia - Hemoglobin 8. today, baseline is 10, this is most likely delusional due to IV fluid after cardiac cath, stable during hospital stay    Discharge Condition:  stable   Follow UP  Follow-up Information    Schedule an appointment as soon as possible for a visit with Limmie Patricia, MD.   Specialty:  Endocrinology   Why:  After discharge from [place] information:   [place] [id number] [phone]         Discharge Instructions  and  Discharge Medications         Discharge Instructions    Discharge instructions    Complete by:  As directed   Follow with Primary MD Limmie Patricia, MD after discharge Get CBC, CMP,checked  by Primary MD next visit.    Activity: As tolerated with Full fall precautions use walker/cane &  assistance as needed   Disposition CIR   Diet: Heart Healthy , carbohydrate modified , with feeding assistance and aspiration precautions.  For Heart failure patients - Check your Weight same time everyday, if you gain over 2 pounds, or you develop in leg swelling, experience more shortness of breath or chest pain, call your Primary MD immediately. Follow Cardiac Low Salt Diet and 1.5 lit/day fluid restriction.   On your next visit with your primary care physician please Get Medicines reviewed and adjusted.  Please request your Prim.MD to go over all Hospital Tests and Procedure/Radiological results at the follow up, please get all Hospital records sent to your Prim MD by signing hospital release before you go home.   If you experience worsening of your admission symptoms, develop shortness of breath, life threatening emergency, suicidal or homicidal thoughts you must seek medical attention immediately by calling 911 or calling your MD immediately  if symptoms less severe.  You Must read complete instructions/literature along with all the possible adverse reactions/side effects for all the Medicines you take and that have been prescribed to you. Take any new Medicines after you have completely understood and accpet all the possible adverse reactions/side effects.   Do not drive, operating heavy machinery, perform activities at heights, swimming or participation in water activities or provide baby sitting services if your were admitted for syncope or siezures until you have seen by Primary MD or a Neurologist and advised to do so again.  Do not drive when taking Pain medications.    Do not take more than prescribed Pain, Sleep and Anxiety Medications  Special Instructions: If you have smoked or chewed Tobacco  in the last 2 yrs please stop smoking, stop any regular Alcohol  and or any Recreational drug use.  Wear Seat belts while driving.   Please note  You were cared for by a  hospitalist during your hospital stay. If you have any questions about your discharge medications or the care you received while you were in the hospital after you are discharged, you can call the unit and asked to speak with the hospitalist on call if the hospitalist that took care of you is not available. Once you are discharged, your primary care physician will handle any further medical issues. Please note that NO REFILLS for any discharge medications will be authorized once you are discharged, as it is imperative that you return to your primary care physician (or establish a relationship with a primary care physician if you do not have one) for your aftercare needs so that they can reassess your need for medications and monitor your lab values.     Increase activity slowly    Complete by:  As directed             Medication List    STOP taking these medications        amLODipine 5 MG tablet  Commonly known as:  NORVASC     GLIPIZIDE XL 10 MG 24 hr tablet  Generic drug:  glipiZIDE     pioglitazone 45 MG tablet  Commonly known as:  ACTOS      TAKE these medications        AEROCHAMBER PLUS FLO-VU Misc     aspirin 81 MG tablet  Take 81 mg by mouth daily.     b complex vitamins capsule  Take 1 capsule by mouth daily.     B-D ULTRAFINE III SHORT PEN 31G X 8 MM Misc  Generic drug:  Insulin Pen Needle     benzonatate 100 MG capsule  Commonly known as:  TESSALON  Take 1 capsule by mouth 3 (three) times daily as needed for cough.     CENTRUM SILVER ULTRA MENS PO  Take 1 tablet by mouth daily.     clopidogrel 75 MG tablet  Commonly known as:  PLAVIX  TAKE 1 TABLET DAILY     CRESTOR 20 MG tablet  Generic drug:  rosuvastatin  Take 20 mg by mouth  daily.     furosemide 40 MG tablet  Commonly known as:  LASIX  TAKE 1 TABLET DAILY     insulin aspart 100 UNIT/ML injection  Commonly known as:  novoLOG  Inject 0-9 Units into the skin 3 (three) times daily with meals.      isosorbide mononitrate 60 MG 24 hr tablet  Commonly known as:  IMDUR  TAKE 1 AND 1/2 TABLETS     DAILY     KLOR-CON M20 20 MEQ tablet  Generic drug:  potassium chloride SA  TAKE 3 TABLETS DAILY     latanoprost 0.005 % ophthalmic solution  Commonly known as:  XALATAN  Place 1 drop into both eyes at bedtime.     losartan-hydrochlorothiazide 100-25 MG tablet  Commonly known as:  HYZAAR  TAKE 1 TABLET DAILY     meclizine 25 MG tablet  Commonly known as:  ANTIVERT  Take 25 mg by mouth as needed for dizziness.     metFORMIN 500 MG 24 hr tablet  Commonly known as:  GLUCOPHAGE-XR  Take 500 mg by mouth 2 (two) times daily.     metoprolol succinate 100 MG 24 hr tablet  Commonly known as:  TOPROL-XL  TAKE 1 TABLET (100 MG)     EVERY MORNING AND TAKE 1/2 TABLET (50 MG) AT BEDTIME     NEXIUM 40 MG capsule  Generic drug:  esomeprazole  Take 40 mg by mouth daily.     ONETOUCH DELICA LANCETS 99991111 Misc  1 each by Other route 2 (two) times daily. Use 1 lancet each to check glucose bid     ONETOUCH VERIO test strip  Generic drug:  glucose blood  1 strip by Other route 2 (two) times daily. Use 1 strip to check glucose bid     oxybutynin 5 MG 24 hr tablet  Commonly known as:  DITROPAN-XL  Take 5 mg by mouth daily.     PROAIR HFA 108 (90 BASE) MCG/ACT inhaler  Generic drug:  albuterol  Inhale into the lungs as needed.     RESTASIS 0.05 % ophthalmic emulsion  Generic drug:  cycloSPORINE  Place 1 drop into both eyes 2 (two) times daily.     THERATEARS ALLERGY 0.025 % ophthalmic solution  Generic drug:  ketotifen  Place 1 drop into both eyes 2 (two) times daily.     Tiotropium Bromide-Olodaterol 2.5-2.5 MCG/ACT Aers  Commonly known as:  STIOLTO RESPIMAT  Inhale 2 puffs into the lungs daily.     VICTOZA 18 MG/3ML Sopn  Generic drug:  Liraglutide  Inject 1.8 mg as directed every morning.     vitamin C 500 MG tablet  Commonly known as:  ASCORBIC ACID  Take 500 mg by mouth 2 (two)  times daily.     Vitamin D (Ergocalciferol) 50000 UNITS Caps capsule  Commonly known as:  DRISDOL  Take 50,000 Units by mouth every 14 (fourteen) days. Every other week on Sunday     vitamin E 400 UNIT capsule  Take 400 Units by mouth daily.     ZETIA 10 MG tablet  Generic drug:  ezetimibe  Take 10 mg by mouth daily.          Diet and Activity recommendation: See Discharge Instructions above   Consults obtained -  neurology inpatient rehabilitation   Major procedures and Radiology Reports - PLEASE review detailed and final reports for all details, in brief -      Dg Chest 2 View  12/11/2015  CLINICAL DATA:  History of coronary artery disease, pre catheterization examination, history of prior tobacco use, COPD. EXAM: CHEST  2 VIEW COMPARISON:  Chest x-ray of September 25th first 2015 and CT scan of the chest of October 24, 2014. FINDINGS: The lungs remain hyperinflated. There is no focal infiltrate. There is no pleural effusion. The cardiac silhouette is mildly enlarged. The central pulmonary vascularity is prominent but there is no cephalization of the vascular pattern. There are 8 intact sternal wires. The patient has undergone previous CABG. The bony thorax exhibits no acute abnormality. IMPRESSION: COPD with superimposed cardiomegaly and mild central pulmonary vascular prominence. These findings are stable. There is no evidence of pneumonia nor pulmonary edema. Electronically Signed   By: David  Martinique M.D.   On: 12/11/2015 10:51   Ct Head Wo Contrast  12/13/2015  CLINICAL DATA:  Recent fall EXAM: CT HEAD WITHOUT CONTRAST TECHNIQUE: Contiguous axial images were obtained from the base of the skull through the vertex without intravenous contrast. COMPARISON:  None. FINDINGS: Bony calvarium is intact. Mild atrophic changes are noted commenced with the patient's given age. No findings to suggest acute hemorrhage, acute infarction or space-occupying mass lesion are identified.  IMPRESSION: No acute abnormality noted. Electronically Signed   By: Inez Catalina M.D.   On: 12/13/2015 08:20   Mr Jodene Nam Head Wo Contrast  12/13/2015  CLINICAL DATA:  Cardiac catheterization yesterday. Awoke with right-sided arm and leg weakness. EXAM: MRI HEAD WITHOUT AND WITH CONTRAST MRA HEAD WITHOUT CONTRAST MRA NECK WITHOUT AND WITH CONTRAST TECHNIQUE: Multiplanar, multiecho pulse sequences of the brain and surrounding structures were obtained without and with intravenous contrast. Angiographic images of the Circle of Willis were obtained using MRA technique without intravenous contrast. Angiographic images of the neck were obtained using MRA technique without and with intravenous contrast. Carotid stenosis measurements (when applicable) are obtained utilizing NASCET criteria, using the distal internal carotid diameter as the denominator. CONTRAST:  31mL MULTIHANCE GADOBENATE DIMEGLUMINE 529 MG/ML IV SOLN COMPARISON:  CT same day FINDINGS: MRI HEAD FINDINGS Diffusion imaging shows acute infarction within the ventral surface of the left medulla. No other acute infarction. Elsewhere the brainstem and cerebellum are normal. The cerebral hemispheres show mild chronic small-vessel ischemic changes of the white matter, fairly typical for age. Small focus of hemosiderin deposition in the right parietal deep white matter. No cortical or large vessel territory infarction. No mass lesion, acute hemorrhage, hydrocephalus or extra-axial collection. No pituitary mass. No inflammatory sinus disease. Small mastoid effusions bilaterally. After contrast administration, no abnormal enhancement of the brain occurs. MRA HEAD FINDINGS Both internal carotid arteries are widely patent into the brain. The anterior and middle cerebral vessels are patent without proximal stenosis, aneurysm or vascular malformation. Both vertebral arteries are patent with the right being dominant. No basilar stenosis. Anterior inferior cerebellar  arteries, superior cerebellar arteries and posterior cerebral arteries appear patent and normal. Posterior inferior cerebellar arteries not demonstrated, possibly congenitally hypoplastic or aplastic. MRA NECK FINDINGS Branching pattern of the brachiocephalic vessels from the arch is normal. No origin stenoses. Both common carotid arteries widely patent to their respective bifurcation. Mild atherosclerotic disease at both carotid bifurcations but no stenosis of the internal carotid artery on either side. 50% stenosis of the external carotid artery on the right. Vertebral artery origins are not well seen because of physiologic motion in the chest. I think the left vertebral artery arises from the arch. In the neck, both vertebral arteries are widely patent without focal stenosis. IMPRESSION:  Acute infarction along the ventral surface of the left medulla. No hemorrhage or swelling. Mild chronic small-vessel ischemic changes elsewhere affecting the brain, fairly typical for age. No carotid bifurcation stenosis other than 50% narrowing of the external carotid artery on the right. Vertebral artery origins can't be accurately evaluated because of physiologic motion. Small left vertebral artery arises directly from the arch. Vertebral arteries both widely patent in the neck to the basilar. Electronically Signed   By: Nelson Chimes M.D.   On: 12/13/2015 15:54   Mr Angiogram Neck W Wo Contrast  12/13/2015  CLINICAL DATA:  Cardiac catheterization yesterday. Awoke with right-sided arm and leg weakness. EXAM: MRI HEAD WITHOUT AND WITH CONTRAST MRA HEAD WITHOUT CONTRAST MRA NECK WITHOUT AND WITH CONTRAST TECHNIQUE: Multiplanar, multiecho pulse sequences of the brain and surrounding structures were obtained without and with intravenous contrast. Angiographic images of the Circle of Willis were obtained using MRA technique without intravenous contrast. Angiographic images of the neck were obtained using MRA technique without  and with intravenous contrast. Carotid stenosis measurements (when applicable) are obtained utilizing NASCET criteria, using the distal internal carotid diameter as the denominator. CONTRAST:  77mL MULTIHANCE GADOBENATE DIMEGLUMINE 529 MG/ML IV SOLN COMPARISON:  CT same day FINDINGS: MRI HEAD FINDINGS Diffusion imaging shows acute infarction within the ventral surface of the left medulla. No other acute infarction. Elsewhere the brainstem and cerebellum are normal. The cerebral hemispheres show mild chronic small-vessel ischemic changes of the white matter, fairly typical for age. Small focus of hemosiderin deposition in the right parietal deep white matter. No cortical or large vessel territory infarction. No mass lesion, acute hemorrhage, hydrocephalus or extra-axial collection. No pituitary mass. No inflammatory sinus disease. Small mastoid effusions bilaterally. After contrast administration, no abnormal enhancement of the brain occurs. MRA HEAD FINDINGS Both internal carotid arteries are widely patent into the brain. The anterior and middle cerebral vessels are patent without proximal stenosis, aneurysm or vascular malformation. Both vertebral arteries are patent with the right being dominant. No basilar stenosis. Anterior inferior cerebellar arteries, superior cerebellar arteries and posterior cerebral arteries appear patent and normal. Posterior inferior cerebellar arteries not demonstrated, possibly congenitally hypoplastic or aplastic. MRA NECK FINDINGS Branching pattern of the brachiocephalic vessels from the arch is normal. No origin stenoses. Both common carotid arteries widely patent to their respective bifurcation. Mild atherosclerotic disease at both carotid bifurcations but no stenosis of the internal carotid artery on either side. 50% stenosis of the external carotid artery on the right. Vertebral artery origins are not well seen because of physiologic motion in the chest. I think the left vertebral  artery arises from the arch. In the neck, both vertebral arteries are widely patent without focal stenosis. IMPRESSION: Acute infarction along the ventral surface of the left medulla. No hemorrhage or swelling. Mild chronic small-vessel ischemic changes elsewhere affecting the brain, fairly typical for age. No carotid bifurcation stenosis other than 50% narrowing of the external carotid artery on the right. Vertebral artery origins can't be accurately evaluated because of physiologic motion. Small left vertebral artery arises directly from the arch. Vertebral arteries both widely patent in the neck to the basilar. Electronically Signed   By: Nelson Chimes M.D.   On: 12/13/2015 15:54   Mr Jeri Cos [id number] Contrast  12/13/2015  CLINICAL DATA:  Cardiac catheterization yesterday. Awoke with right-sided arm and leg weakness. EXAM: MRI HEAD WITHOUT AND WITH CONTRAST MRA HEAD WITHOUT CONTRAST MRA NECK WITHOUT AND WITH CONTRAST TECHNIQUE: Multiplanar, multiecho pulse sequences of  the brain and surrounding structures were obtained without and with intravenous contrast. Angiographic images of the Circle of Willis were obtained using MRA technique without intravenous contrast. Angiographic images of the neck were obtained using MRA technique without and with intravenous contrast. Carotid stenosis measurements (when applicable) are obtained utilizing NASCET criteria, using the distal internal carotid diameter as the denominator. CONTRAST:  40mL MULTIHANCE GADOBENATE DIMEGLUMINE 529 MG/ML IV SOLN COMPARISON:  CT same day FINDINGS: MRI HEAD FINDINGS Diffusion imaging shows acute infarction within the ventral surface of the left medulla. No other acute infarction. Elsewhere the brainstem and cerebellum are normal. The cerebral hemispheres show mild chronic small-vessel ischemic changes of the white matter, fairly typical for age. Small focus of hemosiderin deposition in the right parietal deep white matter. No cortical or large  vessel territory infarction. No mass lesion, acute hemorrhage, hydrocephalus or extra-axial collection. No pituitary mass. No inflammatory sinus disease. Small mastoid effusions bilaterally. After contrast administration, no abnormal enhancement of the brain occurs. MRA HEAD FINDINGS Both internal carotid arteries are widely patent into the brain. The anterior and middle cerebral vessels are patent without proximal stenosis, aneurysm or vascular malformation. Both vertebral arteries are patent with the right being dominant. No basilar stenosis. Anterior inferior cerebellar arteries, superior cerebellar arteries and posterior cerebral arteries appear patent and normal. Posterior inferior cerebellar arteries not demonstrated, possibly congenitally hypoplastic or aplastic. MRA NECK FINDINGS Branching pattern of the brachiocephalic vessels from the arch is normal. No origin stenoses. Both common carotid arteries widely patent to their respective bifurcation. Mild atherosclerotic disease at both carotid bifurcations but no stenosis of the internal carotid artery on either side. 50% stenosis of the external carotid artery on the right. Vertebral artery origins are not well seen because of physiologic motion in the chest. I think the left vertebral artery arises from the arch. In the neck, both vertebral arteries are widely patent without focal stenosis. IMPRESSION: Acute infarction along the ventral surface of the left medulla. No hemorrhage or swelling. Mild chronic small-vessel ischemic changes elsewhere affecting the brain, fairly typical for age. No carotid bifurcation stenosis other than 50% narrowing of the external carotid artery on the right. Vertebral artery origins can't be accurately evaluated because of physiologic motion. Small left vertebral artery arises directly from the arch. Vertebral arteries both widely patent in the neck to the basilar. Electronically Signed   By: Nelson Chimes M.D.   On: 12/13/2015  15:54   Dg Chest Port 1 View  12/13/2015  CLINICAL DATA:  Right leg weakness today. Cardiac catheterization yesterday. EXAM: PORTABLE CHEST 1 VIEW COMPARISON:  Chest CT 10/24/2014.  Radiographs 12/11/2015. FINDINGS: Stable cardiomegaly status post CABG. There is mildly increased vascular congestion without overt pulmonary edema. There is no confluent airspace opacity, pneumothorax or significant pleural effusion. The bones appear unchanged. IMPRESSION: Mildly increased vascular congestion without overt pulmonary edema or confluent airspace opacity. Stable cardiomegaly. Electronically Signed   By: Richardean Sale M.D.   On: 12/13/2015 07:59    Micro Results     No results found for this or any previous visit (from the past 240 hour(s)).     Today   Subjective:   Douglas Carrillo today has no headache,no chest or abdominal pain, denies nausea or vomiting .  Objective:   Blood pressure 145/49, pulse 76, temperature 98.6 F (37 C), temperature source Oral, resp. rate 20, height 6' (1.829 m), weight 92.987 kg (205 lb), SpO2 94 %.   Intake/Output Summary (Last 24 hours) at  12/15/15 1401 Last data filed at 12/15/15 0843  Gross per 24 hour  Intake    510 ml  Output    401 ml  Net    109 ml    Exam Awake Alert, Oriented X 3, No new Normal affect  [place].AT,PERRAL  Supple Neck,No JVD.  Symmetrical Chest wall movement, Good air movement bilaterally, CTAB  RRR,No Gallops,Rubs or new Murmurs, No Parasternal Heave  +ve B.Sounds, Abd Soft, No tenderness, No organomegaly appriciated,  No Cyanosis, Clubbing or edema, No new Rash or bruise , right: Bruising at site of previous cath, right-sided weakness+.   Data Review   CBC w Diff:  Lab Results  Component Value Date   WBC 4.9 12/15/2015   HGB 8.6* 12/15/2015   HCT 27.3* 12/15/2015   PLT 176 12/15/2015   LYMPHOPCT 22 12/13/2015   MONOPCT 7 12/13/2015   EOSPCT 1 12/13/2015   BASOPCT 0 12/13/2015    CMP:  Lab Results  Component  Value Date   NA 141 12/15/2015   K 3.5 12/15/2015   CL 109 12/15/2015   CO2 24 12/15/2015   BUN 16 12/15/2015   CREATININE 1.18 12/15/2015   CREATININE 1.36* 12/11/2015   PROT 7.3 12/13/2015   ALBUMIN 3.5 12/13/2015   BILITOT 0.6 12/13/2015   ALKPHOS 58 12/13/2015   AST 24 12/13/2015   ALT 16* 12/13/2015  .   Total Time in preparing paper work, data evaluation and todays exam - 35 minutes  Dae Highley M.D on 12/15/2015 at 2:01 PM  [facility]   Office  [phone]

## 2015-12-15 NOTE — Progress Notes (Signed)
Late entry. Patient received at 1621 via wheelchair. Alert and oriented x 4 . No complaint of pain. Right sided weakness noted. Oriented patient and family to rehab. No questions verbalized. Continue with plan of care.  Mliss Sax

## 2015-12-16 ENCOUNTER — Inpatient Hospital Stay (HOSPITAL_COMMUNITY): Payer: Medicare Other | Admitting: Physical Therapy

## 2015-12-16 ENCOUNTER — Inpatient Hospital Stay (HOSPITAL_COMMUNITY): Payer: Medicare Other | Admitting: Occupational Therapy

## 2015-12-16 LAB — COMPREHENSIVE METABOLIC PANEL
ALT: 19 U/L (ref 17–63)
AST: 28 U/L (ref 15–41)
Albumin: 2.7 g/dL — ABNORMAL LOW (ref 3.5–5.0)
Alkaline Phosphatase: 51 U/L (ref 38–126)
Anion gap: 7 (ref 5–15)
BUN: 16 mg/dL (ref 6–20)
CHLORIDE: 110 mmol/L (ref 101–111)
CO2: 25 mmol/L (ref 22–32)
CREATININE: 1.17 mg/dL (ref 0.61–1.24)
Calcium: 8.8 mg/dL — ABNORMAL LOW (ref 8.9–10.3)
GFR, EST NON AFRICAN AMERICAN: 55 mL/min — AB (ref >60)
Glucose, Bld: 143 mg/dL — ABNORMAL HIGH (ref 65–99)
POTASSIUM: 3.9 mmol/L (ref 3.5–5.1)
Sodium: 142 mmol/L (ref 135–145)
Total Bilirubin: 0.9 mg/dL (ref 0.3–1.2)
Total Protein: 6.5 g/dL (ref 6.5–8.1)

## 2015-12-16 LAB — CBC WITH DIFFERENTIAL/PLATELET
Basophils Absolute: 0 10*3/uL (ref 0.0–0.1)
Basophils Relative: 0 %
EOS PCT: 3 %
Eosinophils Absolute: 0.1 10*3/uL (ref 0.0–0.7)
HCT: 27.4 % — ABNORMAL LOW (ref 39.0–52.0)
Hemoglobin: 8.4 g/dL — ABNORMAL LOW (ref 13.0–17.0)
LYMPHS ABS: 1 10*3/uL (ref 0.7–4.0)
LYMPHS PCT: 25 %
MCH: 27.8 pg (ref 26.0–34.0)
MCHC: 30.7 g/dL (ref 30.0–36.0)
MCV: 90.7 fL (ref 78.0–100.0)
MONO ABS: 0.3 10*3/uL (ref 0.1–1.0)
MONOS PCT: 7 %
Neutro Abs: 2.7 10*3/uL (ref 1.7–7.7)
Neutrophils Relative %: 65 %
PLATELETS: 198 10*3/uL (ref 150–400)
RBC: 3.02 MIL/uL — ABNORMAL LOW (ref 4.22–5.81)
RDW: 18.5 % — AB (ref 11.5–15.5)
WBC: 4.1 10*3/uL (ref 4.0–10.5)

## 2015-12-16 LAB — GLUCOSE, CAPILLARY
GLUCOSE-CAPILLARY: 140 mg/dL — AB (ref 65–99)
GLUCOSE-CAPILLARY: 102 mg/dL — AB (ref 65–99)
Glucose-Capillary: 91 mg/dL (ref 65–99)
Glucose-Capillary: 110 mg/dL — ABNORMAL HIGH (ref 65–99)

## 2015-12-16 NOTE — Evaluation (Addendum)
Occupational Therapy Assessment and Plan  Patient Details  Name: Douglas Carrillo MRN: [id number] Date of Birth: Sep 11, 1928  OT Diagnosis: abnormal posture, hemiplegia affecting dominant side and muscle weakness (generalized) Rehab Potential: Rehab Potential (ACUTE ONLY): Excellent ELOS: 12-14 days   Today's Date: 12/17/2015 OT Individual Time: 1300 -1400 OT Total Time: 60 Min        Problem List:  Patient Active Problem List   Diagnosis Date Noted  . Stroke, acute, thrombotic (HCC) 12/15/2015  . Hemiparesis affecting dominant side as late effect of stroke (HCC) 12/15/2015  . Anemia 12/15/2015  . Spastic neurogenic bladder 12/15/2015  . GERD (gastroesophageal reflux disease) 12/15/2015  . Hypokalemia 12/15/2015  . Cerebral infarction due to unspecified mechanism   . CVA (cerebral infarction) 12/13/2015  . Prolonged Q-T interval on ECG 12/13/2015  . Right sided weakness 12/13/2015  . Elevated troponin 12/13/2015  . Acute kidney failure (HCC) 12/13/2015  . Normocytic anemia 12/13/2015  . Diabetes mellitus with complication (HCC)   . CAD in native artery   . Aortic stenosis   . OSA (obstructive sleep apnea) 02/08/2015  . COPD, moderate (HCC) 01/12/2015  . COPD (chronic obstructive pulmonary disease) (HCC) 11/10/2014  . Nocturnal hypoxia 11/10/2014  . Dyspnea and respiratory abnormality 10/19/2014  . Aortic valve stenosis 05/27/2014  . CAD (coronary artery disease) of artery bypass graft 11/30/2013  . CAD (coronary artery disease) 11/30/2013  . Hyperlipidemia with target LDL less than 70 11/30/2013  . Essential hypertension 11/30/2013  . DM2 (diabetes mellitus, type 2) (HCC) 11/30/2013  . RBBB 11/30/2013    Past Medical History:  Past Medical History  Diagnosis Date  . Hypertension   . High cholesterol   . Prostate cancer (HCC)   . CAD (coronary artery disease)   . OSA (obstructive sleep apnea)   . COPD (chronic obstructive pulmonary disease) (HCC)     MODERATE  .  Diabetes mellitus (HCC)   . GERD (gastroesophageal reflux disease)   . Vertigo   . Arthritis     OA  . Anemia    Past Surgical History:  Past Surgical History  Procedure Laterality Date  . Coronary artery bypass graft  03/29/1999    x 4:  LIMA to the LAD,SVG to the diagonal branch of the LAD,SVG to the intermediate coronary artery & SVG to the posterior descending branch of the RCA.  . Colon surgery      x 4  . Nm myocar perf wall motion  04/28/2012    Low Risk Scan  . Prostate surgery    . Cardiac catheterization    . Cardiac catheterization N/A 12/12/2015    Procedure: Right/Left Heart Cath and Coronary/Graft Angiography;  Surgeon: Lennette Bihari, MD;  Location: [facility] INVASIVE CV LAB;  Service: Cardiovascular;  Laterality: N/A;  . Peripheral vascular catheterization N/A 12/12/2015    Procedure: Aortic Arch Angiography;  Surgeon: Lennette Bihari, MD;  Location: [facility] INVASIVE CV LAB;  Service: Cardiovascular;  Laterality: N/A;  . Eye surgery      Assessment & Plan Clinical Impression: Douglas Carrillo is a 81 y.o. right handed male with history of HTN, DM type2, CABG, OSA--oxygen at nights, severe aortic stenosis with recent onset of SOB and cardiac cath 12/20 with transient right arm and leg weakness post procedure likely due to embolic infarct. He was discharged to home but readmitted the next day on 12/21 am with fall out of bed due to right leg weakness and decreased sensation right half of his body.  MRI brain done revealing acute infarct along ventral surface of left medulla and MRA brain/head without significant stenosis. Dr. Leonie Man consulted and felt that stroke due to sequela of cardiac cath and to continue Plavix for secondary stroke prevention. Carotid dopplers without significant ICA stenosis. Patient with resultant mild right facial droop, RLE weakness affecting balance and mobility as well as hypoxia with activity. CIR recommended for follow up therapy Patient transferred to CIR on  12/15/2015 .    Patient currently requires mod with basic self-care skills secondary to muscle weakness, decreased cardiorespiratoy endurance, decreased coordination and decreased standing balance, decreased postural control, hemiplegia and decreased balance strategies.  Prior to hospitalization, patient could complete ADLs/IADLs with independent .  Patient will benefit from skilled intervention to decrease level of assist with basic self-care skills, increase independence with basic self-care skills and increase level of independence with iADL prior to discharge home independently.  Anticipate patient will require intermittent supervision and follow up home health.  OT - End of Session Activity Tolerance: Tolerates 10 - 20 min activity with multiple rests Endurance Deficit: Yes Endurance Deficit Description: Requires several rest breaks throughout bathing/dressing session OT Assessment Rehab Potential (ACUTE ONLY): Excellent OT Patient demonstrates impairments in the following area(s): Balance;Endurance;Safety;Perception;Motor;Sensory OT Basic ADL's Functional Problem(s): Bathing;Dressing;Toileting;Eating;Grooming OT Advanced ADL's Functional Problem(s): Simple Meal Preparation OT Transfers Functional Problem(s): Toilet;Tub/Shower OT Additional Impairment(s): Fuctional Use of Upper Extremity OT Plan OT Intensity: Minimum of 1-2 x/day, 45 to 90 minutes OT Frequency: 5 out of 7 days OT Duration/Estimated Length of Stay: 10-12 days OT Treatment/Interventions: Balance/vestibular training;Discharge planning;Community reintegration;DME/adaptive equipment instruction;Disease mangement/prevention;Functional mobility training;Neuromuscular re-education;Patient/family education;Psychosocial support;Self Care/advanced ADL retraining;Therapeutic Activities;Splinting/orthotics;Therapeutic Exercise;UE/LE Strength taining/ROM;UE/LE Coordination activities OT Self Feeding Anticipated Outcome(s): Mod I OT  Basic Self-Care Anticipated Outcome(s): Mod I OT Toileting Anticipated Outcome(s): Supervision- Mod I OT Bathroom Transfers Anticipated Outcome(s): Supervision OT Recommendation Patient destination: Home Follow Up Recommendations: Home health OT;Outpatient OT Equipment Recommended: To be determined;3 in 1 bedside comode;Tub/shower bench   Skilled Therapeutic Intervention Pt seen for OT eval and ADL bathing/ dressing session. Pt sitting up in recliner upon arrival, agreeable to tx session. He requested to trial no O2 during session, pt on 2 L upon arrival. He required rest breaks throughout session, however, no s/s of SOB/ distress during hour long tx session. He ambulated throughout room with RW and min A. Pt with one LOB episode requiring mod A to regain balance. Pt bathed seated on tub transfer bench with supervision, completing lateral leans for buttock hygiene. He dressed seated on tub transfer bench with VCs for hemi dressing technique. He required mod A for sit <> stands during functional transfers with VCs for rocking technique and hand placement.  Steadying assist provided for dynamic standing tasks. Pt returned to recliner at end of session. He was provided with theraputty for use over rest of weekend. Will provide formal HEP at next session.  Pt left in recliner, all needs in reach and family members present.   OT Evaluation Precautions/Restrictions  Precautions Precautions: Fall Restrictions Weight Bearing Restrictions: No General Chart Reviewed: Yes Pain   Voiced slight discomfort in R hip where he had fallen PTA. Showered and repositioned Home Living/Prior [place] expects to be discharged to:: Private residence Living Arrangements: Alone Available Help at Discharge: Available 24 hours/day, Family Type of Home: House Home Access: Stairs to enter Technical brewer of Steps: 2 Entrance Stairs-Rails: None Home Layout: One level Bathroom  Shower/Tub: Tub/shower unit, Architectural technologist: Standard Additional  Comments: Daughter lives around the corner, is a semi-retired Therapist, sports and pt states she can provide 24/7 care if needed.  Lives With: Alone IADL History Homemaking Responsibilities: Yes Current License: Yes Mode of Transportation: Car Occupation: Retired Leisure and Hobbies: Goes to [employer] daily. Go out to eat with friends.  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 (treadmill, rowing, weights), and like watching sports (especially college basketball) Vision/Perception  Vision- History Baseline Vision/History: Wears glasses Wears Glasses: At all times Vision- Assessment Vision Assessment?: Yes Eye Alignment: Within Functional Limits Ocular Range of Motion: Within Functional Limits Alignment/Gaze Preference: Within Defined Limits  Cognition Overall Cognitive Status: Within Functional Limits for tasks assessed Arousal/Alertness: Awake/alert Orientation Level: Person;Place;Situation Person: Oriented Place: Oriented Situation: Oriented Year: 2016 Month: December Day of Week: Correct Memory: Appears intact Immediate Memory Recall: Sock;Blue;Bed Memory Recall: Sock;Blue;Bed Memory Recall Sock: Without Cue Memory Recall Blue: Without Cue Memory Recall Bed: Without Cue Awareness: Appears intact Problem Solving: Appears intact Safety/Judgment: Appears intact Sensation Sensation Light Touch: Impaired Detail Light Touch Impaired Details: Impaired RUE;Impaired RLE (Diminshed light touch R forearm and below. Diminished L LE knee and below) Proprioception: Appears Intact Coordination Gross Motor Movements are Fluid and Coordinated: No Fine Motor Movements are Fluid and Coordinated:  No Coordination and Movement Description: unsure if d/t coodination or strength impairments Finger Nose Finger Test: slow speed and accuracy RUE Motor  Motor Motor: Hemiplegia;Abnormal tone;Abnormal postural alignment and control Motor - Skilled Clinical Observations: R hemiparesis, decreased postural control and midline orientation  Trunk/Postural Assessment  Cervical Assessment Cervical Assessment: Within Functional Limits Thoracic Assessment Thoracic Assessment: Within Functional Limits Lumbar Assessment Lumbar Assessment: Within Functional Limits Postural Control Postural Control: Deficits on evaluation (Decreased righting reactions)  Balance Balance Balance Assessed: Yes Static Sitting Balance Static Sitting - Balance Support: Feet supported;No upper extremity supported Static Sitting - Level of Assistance: 5: Stand by assistance Dynamic Sitting Balance Dynamic Sitting - Balance Support: During functional activity;Feet supported;Right upper extremity supported;Left upper extremity supported Dynamic Sitting - Level of Assistance: 4: Min assist;5: Stand by assistance Dynamic Sitting - Balance Activities: Reaching for objects;Forward lean/weight shifting;Reaching across midline Sitting balance - Comments: Sitting to complete bathing task Static Standing Balance Static Standing - Balance Support: During functional activity Static Standing - Level of Assistance: 3: Mod assist Dynamic Standing Balance Dynamic Standing - Balance Support: During functional activity;Right upper extremity supported;Left upper extremity supported Dynamic Standing - Level of Assistance: 3: Mod assist Dynamic Standing - Balance Activities: Reaching for objects;Reaching across midline;Forward lean/weight shifting;Lateral lean/weight shifting Dynamic Standing - Comments: During LB dressing task/ toileting Extremity/Trunk Assessment RUE Assessment RUE Assessment: Exceptions to WFL (3/5 overall with  decreased coordination. Full AROM) LUE Assessment LUE Assessment: Within Functional Limits   See Function Navigator for Current Functional Status.   Refer to Care Plan for Long Term Goals  Recommendations for other services: None  Discharge Criteria: Patient will be discharged from OT if patient refuses treatment 3 consecutive times without medical reason, if treatment goals not met, if there is a change in medical status, if patient makes no progress towards goals or if patient is discharged from hospital.  The above assessment, treatment plan, treatment alternatives and goals were discussed and mutually agreed upon: by patient and by family  Ernestina Patches 12/17/2015, 6:34 AM

## 2015-12-16 NOTE — Evaluation (Signed)
Physical Therapy Assessment and Plan  Patient Details  Name: Kenyatte Chatmon MRN: [id number] Date of Birth: 1928-03-31  PT Diagnosis: Abnormal posture, Abnormality of gait, Difficulty walking, Hemiparesis dominant, Impaired sensation, Muscle weakness and Pain in back Rehab Potential: Excellent ELOS: 12-14 days   Today's Date: 12/16/2015 PT Individual Time: 9798-9211 PT Individual Time Calculation (min): 90 min    Problem List:  Patient Active Problem List   Diagnosis Date Noted  . Stroke, acute, thrombotic ([place]) 12/15/2015  . Hemiparesis affecting dominant side as late effect of stroke ([place]) 12/15/2015  . Anemia 12/15/2015  . Spastic neurogenic bladder 12/15/2015  . GERD (gastroesophageal reflux disease) 12/15/2015  . Hypokalemia 12/15/2015  . Cerebral infarction due to unspecified mechanism   . CVA (cerebral infarction) 12/13/2015  . Prolonged Q-T interval on ECG 12/13/2015  . Right sided weakness 12/13/2015  . Elevated troponin 12/13/2015  . Acute kidney failure ([place]) 12/13/2015  . Normocytic anemia 12/13/2015  . Diabetes mellitus with complication ([place])   . CAD in native artery   . Aortic stenosis   . OSA (obstructive sleep apnea) 02/08/2015  . COPD, moderate ([place]) 01/12/2015  . COPD (chronic obstructive pulmonary disease) ([place]) 11/10/2014  . Nocturnal hypoxia 11/10/2014  . Dyspnea and respiratory abnormality 10/19/2014  . Aortic valve stenosis 05/27/2014  . CAD (coronary artery disease) of artery bypass graft 11/30/2013  . CAD (coronary artery disease) 11/30/2013  . Hyperlipidemia with target LDL less than 70 11/30/2013  . Essential hypertension 11/30/2013  . DM2 (diabetes mellitus, type 2) ([place]) 11/30/2013  . RBBB 11/30/2013    Past Medical History:  Past Medical History  Diagnosis Date  . Hypertension   . High cholesterol   . Prostate cancer ([place])   . CAD (coronary artery disease)   . OSA (obstructive sleep apnea)   . COPD (chronic obstructive pulmonary  disease) ([facility])     MODERATE  . Diabetes mellitus ([place])   . GERD (gastroesophageal reflux disease)   . Vertigo   . Arthritis     OA  . Anemia    Past Surgical History:  Past Surgical History  Procedure Laterality Date  . Coronary artery bypass graft  03/29/1999    x 4:  LIMA to the LAD,SVG to the diagonal branch of the LAD,SVG to the intermediate coronary artery & SVG to the posterior descending branch of the RCA.  . Colon surgery      x 4  . Nm myocar perf wall motion  04/28/2012    Low Risk Scan  . Prostate surgery    . Cardiac catheterization    . Cardiac catheterization N/A 12/12/2015    Procedure: Right/Left Heart Cath and Coronary/Graft Angiography;  Surgeon: Troy Sine, MD;  Location: [facility];  Service: Cardiovascular;  Laterality: N/A;  . Peripheral vascular catheterization N/A 12/12/2015    Procedure: Aortic Arch Angiography;  Surgeon: Troy Sine, MD;  Location: [facility];  Service: Cardiovascular;  Laterality: N/A;  . Eye surgery      Assessment & Plan Clinical Impression: Zephan Beauchaine is a 81 y.o. right handed male with history of HTN, DM type2, CABG, OSA--oxygen at nights, severe aortic stenosis with recent onset of SOB and cardiac cath 12/20 with transient right arm and leg weakness post procedure likely due to embolic infarct. He was discharged to home but readmitted the next day on 12/21 am with fall out of bed due to right leg weakness and decreased sensation right half of his body.  MRI brain done revealing acute infarct along ventral surface of left medulla and MRA brain/head without significant stenosis. Dr. Leonie Man consulted and felt that stroke due to sequela of cardiac cath and to continue Plavix for secondary stroke prevention. Carotid dopplers without significant ICA stenosis. Patient with resultant mild right facial droop, RLE weakness affecting balance and mobility as well as hypoxia with activity. CIR recommended for follow up  therapy  Independent PTA but did get dyspneic at times with household ambulation and walking to mail box. Completed cardio-pulmonary rehab earlier this year. Daughter lives next door and works partime but available 24/7 after next week.  Patient transferred to CIR on 12/15/2015 .   Patient currently requires mod with mobility secondary to muscle weakness, decreased cardiorespiratoy endurance and decreased oxygen support, impaired timing and sequencing, unbalanced muscle activation and decreased coordination and decreased sitting balance, decreased standing balance, decreased postural control, hemiplegia and decreased balance strategies.  Prior to hospitalization, patient was independent  with mobility and lived with Alone in a House home.  Home access is 2Stairs to enter.  Patient will benefit from skilled PT intervention to maximize safe functional mobility, minimize fall risk and decrease caregiver burden for planned discharge home with 24 hour supervision.  Anticipate patient will benefit from follow up [place] at discharge.  PT - End of Session Activity Tolerance: Tolerates 30+ min activity with multiple rests Endurance Deficit: Yes Endurance Deficit Description: requires several rest breaks throughout session due to fatigue PT Assessment Rehab Potential (ACUTE/IP ONLY): Excellent PT Patient demonstrates impairments in the following area(s): Balance;Perception;Pain;Safety;Endurance;Sensory;Motor PT Transfers Functional Problem(s): Bed Mobility;Bed to Chair;Furniture;Car;Floor PT Locomotion Functional Problem(s): Ambulation;Wheelchair Mobility;Stairs PT Plan PT Intensity: Minimum of 1-2 x/day ,45 to 90 minutes PT Frequency: 5 out of 7 days PT Duration Estimated Length of Stay: 12-14 days PT Treatment/Interventions: Ambulation/gait training;Community reintegration;Balance/vestibular training;Disease management/prevention;Discharge planning;DME/adaptive equipment instruction;Functional electrical  stimulation;Neuromuscular re-education;Functional mobility training;Pain management;Patient/family education;Psychosocial support;Splinting/orthotics;Stair training;Therapeutic Exercise;Therapeutic Activities;UE/LE Strength taining/ROM;UE/LE Coordination activities;Wheelchair propulsion/positioning PT Transfers Anticipated Outcome(s): mod I PT Locomotion Anticipated Outcome(s): S with LRAD PT Recommendation Recommendations for Other Services: Neuropsych consult Follow Up Recommendations: Outpatient PT Patient destination: Home Equipment Recommended: To be determined  Skilled Therapeutic Intervention Pt received seated in recliner, no c/o pain and agreeable to treatment. Initial PT evaluation performed and completed. Assessed all mobility as described below with modA overall. Pt limited by decreased strength, coordination, neuromotor control of R extremities. Daughter and granddaughter present for second half of session. Educated pt and family in goals of rehab, rehab process, estimated length of stay, and goals set at [facility] I/S for mobility in the home, falls safety and use of call bell system to have staff present before getting up. Pt maintained on 2L O2 throughout session and remained >94% with all mobility. Returned to room and seated in recliner; remained seated in recliner at completion of session with family present and all needs within reach.   PT Evaluation Precautions/Restrictions Precautions Precautions: Fall Restrictions Weight Bearing Restrictions: No General Chart Reviewed: Yes Family/Caregiver Present: No  Vital SignsOxygen Therapy O2 Device: Nasal Cannula O2 Flow Rate (L/min): 2 L/min Pain Pain Assessment Pain Assessment: 0-10 Pain Score: 5  Pain Type: Acute pain Pain Location: Back Pain Orientation: Right Pain Descriptors / Indicators: Aching;Sore Pain Onset: Gradual Patients Stated Pain Goal: 2 Pain Intervention(s): Other (Comment) (pre-medicated) Multiple Pain  Sites: No Home Living/Prior Functioning Home Living Available Help at Discharge: Available 24 hours/day;Family Type of Home: House Home Access: Stairs to enter [employer] of Steps:  2 Entrance Stairs-Rails: None Home Layout: One level Bathroom Shower/Tub: Product/process development scientist: Standard Additional Comments: Daughter lives around the corner, is a semi-retired Therapist, sports and pt states she can provide 24/7 care if needed.  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 (treadmill, rowing, weights), and like watching sports (especially college basketball) Vision/Perception  Vision - Assessment Eye Alignment: Within Functional Limits Ocular Range of Motion: Within Functional Limits Alignment/Gaze Preference: Within Defined Limits  Cognition Overall Cognitive Status: Within Functional Limits for tasks assessed Arousal/Alertness: Awake/alert Orientation Level: Oriented X4 Sensation Sensation Light Touch: Impaired Detail Light Touch Impaired Details: Impaired RUE;Impaired RLE (diminshed/dull mid forearm to hands, knee and below LE) Stereognosis: Appears Intact Hot/Cold: Not tested Proprioception: Appears Intact (UE not tested, BLE hallux 10/10 acurate) Coordination Gross Motor Movements are Fluid and Coordinated: No Coordination and Movement Description: unsure if d/t coodination or strength impairments Finger Nose Finger Test: slow speed and accuracy RUE Heel Shin Test: Unable to perform RLE, WNL LLE Motor  Motor Motor: Hemiplegia;Abnormal tone;Abnormal postural alignment and control Motor - Skilled Clinical Observations: R hemiparesis, decreased postural control and midline orientation  Mobility Bed Mobility Bed Mobility: Supine  to Sit;Sit to Supine Supine to Sit: 5: Supervision Supine to Sit Details: Verbal cues for sequencing;Verbal cues for precautions/safety;Verbal cues for technique Sit to Supine: 5: Supervision Sit to Supine - Details: Verbal cues for sequencing;Verbal cues for precautions/safety;Verbal cues for technique Transfers Transfers: Yes Stand Pivot Transfers: 3: Mod assist;With armrests Stand Pivot Transfer Details: Verbal cues for technique;Verbal cues for precautions/safety;Verbal cues for sequencing;Tactile cues for posture;Tactile cues for placement Locomotion  Ambulation Ambulation: Yes Ambulation/Gait Assistance: 3: Mod assist Ambulation Distance (Feet): 60 Feet Assistive device: Rolling walker Ambulation/Gait Assistance Details: Verbal cues for technique;Verbal cues for precautions/safety;Verbal cues for sequencing;Manual facilitation for weight shifting;Verbal cues for gait pattern;Tactile cues for posture Gait Gait: Yes Gait Pattern: Impaired Gait Pattern: Poor foot clearance - right;Narrow base of support;Decreased weight shift to right;Decreased dorsiflexion - right;Decreased stride length Gait velocity: decreased for age/gender norms Stairs / Additional Locomotion Stairs: Yes Stairs Assistance: 3: Mod assist Stairs Assistance Details: Tactile cues for posture;Tactile cues for weight beaing;Verbal cues for sequencing;Verbal cues for technique;Verbal cues for precautions/safety;Verbal cues for gait pattern Stair Management Technique: Two rails;Step to pattern;Forwards Number of Stairs: 8 Height of Stairs: 3 Architect: Yes Wheelchair Assistance: 5: Investment banker, operational Details: Verbal cues for sequencing;Verbal cues for technique;Verbal cues for Information systems manager: Both upper extremities Wheelchair Parts Management: Needs assistance Distance: 150  Trunk/Postural Assessment  Cervical Assessment Cervical Assessment:  Within Functional Limits Thoracic Assessment Thoracic Assessment: Within Functional Limits Lumbar Assessment Lumbar Assessment: Within Functional Limits Postural Control Postural Control: Deficits on evaluation (decreased righting reactions, stepping strategies to recover sitting and standing balance)  Balance Balance Balance Assessed: Yes Static Sitting Balance Static Sitting - Balance Support: Feet supported;No upper extremity supported Static Sitting - Level of Assistance: 5: Stand by assistance Dynamic Sitting Balance Dynamic Sitting - Balance Support: Bilateral upper extremity supported Dynamic Sitting - Level of Assistance: 5: Stand by assistance Dynamic Sitting - Balance Activities: Reaching for objects;Forward lean/weight shifting;Reaching across midline Static Standing Balance Static Standing - Balance Support: No upper extremity supported Static Standing - Level of Assistance: 3: Mod assist Static Stance: Eyes closed Static Stance: Eyes Closed: maxA Dynamic Standing  Balance Dynamic Standing - Balance Support: During functional activity;Bilateral upper extremity supported Dynamic Standing - Level of Assistance: 3: Mod assist Dynamic Standing - Balance Activities: Forward lean/weight shifting;Reaching across midline;Reaching for objects Extremity Assessment  RUE Assessment RUE Assessment: Exceptions to [facility] (grossly 3/5 throughout, decr coordination) LUE Assessment LUE Assessment: Within Functional Limits RLE Assessment RLE Assessment: Exceptions to [facility] (Hip flexion 3+/5, knee extension 4/5, knee flexion 4-/5, ankle dorsiflexion 1/5, ankle plantar flexion 3/5, hip abduction 3/5, hip adduction 4/5) LLE Assessment LLE Assessment: Within Functional Limits (hip flexion limited to 4/5, all others 4+/5 to 5/5)   See Function Navigator for Current Functional Status.   Refer to Care Plan for Long Term Goals  Recommendations for other services: Neuropsych  Discharge  Criteria: Patient will be discharged from PT if patient refuses treatment 3 consecutive times without medical reason, if treatment goals not met, if there is a change in medical status, if patient makes no progress towards goals or if patient is discharged from hospital.  The above assessment, treatment plan, treatment alternatives and goals were discussed and mutually agreed upon: by patient and by family  Luberta Mutter 12/16/2015, 12:56 PM

## 2015-12-16 NOTE — Progress Notes (Signed)
Subjective: Patient has no complaints. He states he feels well.  Objective: BP 148/57 mmHg  Pulse 85  Temp(Src) 98.9 F (37.2 C) (Oral)  Resp 20  SpO2 96%  Elderly male in no acute distress. He is encouraged that his strength and movement in his arm is improving. Neck is supple without lymphadenopathy or thyromegaly. Chest is clear to auscultation Cardiac exam S1 and S2 are regular. 3/6 systolic ejection murmur Abdominal exam active bowel sounds, soft, overweight Extremities: No significant edema.  A/p: Medical Problem List and Plan: 1. 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 Cont to modify secondary risk factors 2. DVT Prophylaxis/Anticoagulation: Mechanical: Sequential compression devices, below knee Bilateral lower extremities Pharmaceutical: Lovenox 3. Pain Management: Patient currently denies pain..  4. Mood: LCSW to follow for evaluation and support 5. Neuropsych: This patient is capable of making decisions on his own behalf. 6. Skin/Wound Care: Maintain adequate nutrition and hydration status. Routine pressure relief measures.  7. Fluids/Electrolytes/Nutrition: Monitor I/O.  Basic Metabolic Panel:    Component Value Date/Time   NA 142 12/16/2015 0647   K 3.9 12/16/2015 0647   CL 110 12/16/2015 0647   CO2 25 12/16/2015 0647   BUN 16 12/16/2015 0647   CREATININE 1.17 12/16/2015 0647   CREATININE 1.36* 12/11/2015 0958   GLUCOSE 143* 12/16/2015 0647   CALCIUM 8.8* 12/16/2015 0647    8. HTN 148/57-169/53 9. DM HbA1c 6.8 on 12/22  CBG (last 3)   Recent Labs  12/15/15 1140 12/15/15 2105 12/16/15 0706  GLUCAP 152* 166* 140*   10. CAD s/p recent CABG Monitor in accordance with increased physical activity and avoid UE resistance excercises 11. OSA Cont O2 at night Evaluate signs of daytime somnolence. 12. AS See CAD 13.  Anemia Hb 8.6 on 12/23 Will periodically monitor Hb to ensure adequate perfusion and energy for therapies Will transfuse if pt becomes symptomatic and/or Hb <7.0 14. Spastic bladder Cont Ditropan 15. GERD Cont Protonix 16. Hypokalemia Cont K+ supplement K+ 3.5 on 12/23 Will monitor periodically 17. COPD Cont Spiriva

## 2015-12-16 NOTE — Progress Notes (Addendum)
Physical Therapy Session Note  Patient Details  Name: Douglas Carrillo MRN: [id number] Date of Birth: 15-Jan-1928  Today's Date: 12/16/2015 PT Individual Time: 1430-1515 PT Individual Time Calculation (min): 45 min   Short Term Goals: Week 1:  PT Short Term Goal 1 (Week 1): Pt will perform bed mobility mod I from flat bed PT Short Term Goal 2 (Week 1): Pt will perform stand pivot transfer consistent S PT Short Term Goal 3 (Week 1): Pt will perform gait with LRAD x100' with min guard and LRAD PT Short Term Goal 4 (Week 1): Pt will perform ascent/descent of 4 6-inch stairs with minA  Skilled Therapeutic Interventions/Progress Updates:  Pt received seated in recliner, family present and no c/o pain. Socks, shoes donned with maxA for time management. MinA for threading RLE into pants, and minA for standing balance while donning pants, occasional UE support on RW. Daughter assisted pt with buttoning and zipping pants due to decreased fine motor control and pt report of pants being tighter than they used to be. W/c propulsion with BUE/BLE x20' to encourage RLE coordination, strengthening. RLE fatigues quickly, and footrests replaced to allow pt to propel remaining distance to gym with BUE. Improved propulsion with theraband donned to w/c. Stand pivot transfer >nustep with minA. Nustep x10 min with BUE/BLE on level 3 with average 83 steps/min. O2 throughout exercise remained >95%. Standing balance with min/modA while performing alternating toe taps to 3" step for RLE weight bearing, stance control, proprioception. RLE again fatigues quickly with increased unsteadiness and poor foot clearance on step. Demonstrated hip flexion marching, long arc quad, and heel/toe raises in sitting for pt to perform in room over the weekend. Performed 1 set 10 reps each. Returned to room with BUE w/c propulsion x150'. Stand pivot to return to recliner minA. Remained seated in recliner with family present and all needs within  reach at completion of session.  Therapy Documentation Precautions:  Precautions Precautions: Fall Restrictions Weight Bearing Restrictions: No Vital Signs: Oxygen Therapy O2 Device: Nasal Cannula O2 Flow Rate (L/min): 2 L/min Pain: Pain Assessment Pain Assessment: No/denies pain Pain Score: 0-No pain   See Function Navigator for Current Functional Status.   Therapy/Group: Individual Therapy  Luberta Mutter 12/16/2015, 2:55 PM

## 2015-12-17 LAB — GLUCOSE, CAPILLARY
GLUCOSE-CAPILLARY: 122 mg/dL — AB (ref 65–99)
GLUCOSE-CAPILLARY: 121 mg/dL — AB (ref 65–99)
Glucose-Capillary: 107 mg/dL — ABNORMAL HIGH (ref 65–99)
Glucose-Capillary: 129 mg/dL — ABNORMAL HIGH (ref 65–99)

## 2015-12-17 NOTE — Progress Notes (Signed)
Subjective: Patient is sitting up. He's had breakfast. He is feeling well and enjoyed therapy yesterday.  Objective: BP 153/68 mmHg  Pulse 84  Temp(Src) 98 F (36.7 C) (Oral)  Resp 18  SpO2 97%  Elderly male in no acute distress. He is encouraged that his strength and movement in his arm is improving. Neck is supple without lymphadenopathy or thyromegaly. Chest is clear to auscultation Cardiac exam S1 and S2 are regular. 3/6 systolic ejection murmur Abdominal exam active bowel sounds, soft, overweight Extremities: No significant edema.  A/p: Medical Problem List and Plan: 1. 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 Cont to modify secondary risk factors 2. DVT Prophylaxis/Anticoagulation: Mechanical: Sequential compression devices, below knee Bilateral lower extremities Pharmaceutical: Lovenox 3. Pain Management: Patient currently denies pain..  4. Mood: LCSW to follow for evaluation and support 5. Neuropsych: This patient is capable of making decisions on his own behalf. 6. Skin/Wound Care: Maintain adequate nutrition and hydration status. Routine pressure relief measures.  7. Fluids/Electrolytes/Nutrition: Monitor I/O.  Basic Metabolic Panel:    Component Value Date/Time   NA 142 12/16/2015 0647   K 3.9 12/16/2015 0647   CL 110 12/16/2015 0647   CO2 25 12/16/2015 0647   BUN 16 12/16/2015 0647   CREATININE 1.17 12/16/2015 0647   CREATININE 1.36* 12/11/2015 0958   GLUCOSE 143* 12/16/2015 0647   CALCIUM 8.8* 12/16/2015 0647    8. HTN 135/58-153/68 9. DM HbA1c 6.8 on 12/22  CBG (last 3)   Recent Labs  12/16/15 1631 12/16/15 2105 12/17/15 0645  GLUCAP 91 110* 122*   10. CAD s/p recent CABG Patient denies any chest pain.  11. OSA Cont O2 at night Evaluate signs of daytime somnolence. 12. AS See CAD 13. Anemia Lab Results  Component Value Date    HGB 8.4* 12/16/2015    14. Spastic bladder Cont Ditropan 15. GERD Cont Protonix 16. Hypokalemiaresolved  Lab Results  Component Value Date   K 3.9 12/16/2015    17. COPD Cont Spiriva

## 2015-12-17 NOTE — Progress Notes (Signed)
Patient's heart rate checked manually this afternoon to be 54bpm.  MD Plotnikov notified of change in patient's heart rate from patient's normal range. Patient reports no complaints. No new orders received. Continue with plan of care.

## 2015-12-18 ENCOUNTER — Inpatient Hospital Stay (HOSPITAL_COMMUNITY): Payer: Medicare Other | Admitting: Occupational Therapy

## 2015-12-18 ENCOUNTER — Inpatient Hospital Stay (HOSPITAL_COMMUNITY): Payer: Medicare Other

## 2015-12-18 ENCOUNTER — Inpatient Hospital Stay (HOSPITAL_COMMUNITY): Payer: Medicare Other | Admitting: Physical Therapy

## 2015-12-18 DIAGNOSIS — E1159 Type 2 diabetes mellitus with other circulatory complications: Secondary | ICD-10-CM

## 2015-12-18 LAB — GLUCOSE, CAPILLARY
GLUCOSE-CAPILLARY: 100 mg/dL — AB (ref 65–99)
GLUCOSE-CAPILLARY: 103 mg/dL — AB (ref 65–99)
GLUCOSE-CAPILLARY: 132 mg/dL — AB (ref 65–99)
Glucose-Capillary: 125 mg/dL — ABNORMAL HIGH (ref 65–99)
Glucose-Capillary: 116 mg/dL — ABNORMAL HIGH (ref 65–99)

## 2015-12-18 MED ORDER — BISACODYL 10 MG RE SUPP: 10.0000 mg | Freq: Every day | RECTAL | Status: DC | PRN

## 2015-12-18 MED ORDER — ISOSORBIDE MONONITRATE ER 30 MG PO TB24
30.0000 mg | ORAL_TABLET | Freq: Every day | ORAL | Status: DC
  Administered 2015-12-19 – 2015-12-29 (×11): 30 mg via ORAL
  Filled 2015-12-18 (×12): qty 1

## 2015-12-18 NOTE — Progress Notes (Signed)
Social Work  Social Work Assessment and Plan  Patient Details  Name: Douglas Carrillo MRN: [id number] Date of Birth: 09-Nov-1928  Today's Date: 12/18/2015  Problem List:  Patient Active Problem List   Diagnosis Date Noted  . Stroke, acute, thrombotic ([facility]) 12/15/2015  . Hemiparesis affecting dominant side as late effect of stroke ([facility]) 12/15/2015  . Anemia 12/15/2015  . Spastic neurogenic bladder 12/15/2015  . GERD (gastroesophageal reflux disease) 12/15/2015  . Hypokalemia 12/15/2015  . Cerebral infarction due to unspecified mechanism   . CVA (cerebral infarction) 12/13/2015  . Prolonged Q-T interval on ECG 12/13/2015  . Right sided weakness 12/13/2015  . Elevated troponin 12/13/2015  . Acute kidney failure ([place]) 12/13/2015  . Normocytic anemia 12/13/2015  . Diabetes mellitus with complication ([place])   . CAD in native artery   . Aortic stenosis   . OSA (obstructive sleep apnea) 02/08/2015  . COPD, moderate ([place]) 01/12/2015  . COPD (chronic obstructive pulmonary disease) ([place]) 11/10/2014  . Nocturnal hypoxia 11/10/2014  . Dyspnea and respiratory abnormality 10/19/2014  . Aortic valve stenosis 05/27/2014  . CAD (coronary artery disease) of artery bypass graft 11/30/2013  . CAD (coronary artery disease) 11/30/2013  . Hyperlipidemia with target LDL less than 70 11/30/2013  . Essential hypertension 11/30/2013  . DM2 (diabetes mellitus, type 2) ([facility]) 11/30/2013  . RBBB 11/30/2013   Past Medical History:  Past Medical History  Diagnosis Date  . Hypertension   . High cholesterol   . Prostate cancer ([facility])   . CAD (coronary artery disease)   . OSA (obstructive sleep apnea)   . COPD (chronic obstructive pulmonary disease) ([facility])     MODERATE  . Diabetes mellitus ([place])   . GERD (gastroesophageal reflux disease)   . Vertigo   . Arthritis     OA  . Anemia    Past Surgical History:  Past Surgical History  Procedure Laterality Date  . Coronary artery bypass graft  03/29/1999     x 4:  LIMA to the LAD,SVG to the diagonal branch of the LAD,SVG to the intermediate coronary artery & SVG to the posterior descending branch of the RCA.  . Colon surgery      x 4  . Nm myocar perf wall motion  04/28/2012    Low Risk Scan  . Prostate surgery    . Cardiac catheterization    . Cardiac catheterization N/A 12/12/2015    Procedure: Right/Left Heart Cath and Coronary/Graft Angiography;  Surgeon: Troy Sine, MD;  Location: [facility];  Service: Cardiovascular;  Laterality: N/A;  . Peripheral vascular catheterization N/A 12/12/2015    Procedure: Aortic Arch Angiography;  Surgeon: Troy Sine, MD;  Location: [facility];  Service: Cardiovascular;  Laterality: N/A;  . Eye surgery     Social History:  reports that he quit smoking about 32 years ago. His smoking use included Cigarettes. He has a 30 pack-year smoking history. He has never used smokeless tobacco. He reports that he drinks alcohol. He reports that he does not use illicit drugs.  Family / Support Systems Marital Status: Widow/Widower Patient Roles: Parent, Volunteer Children: Douglas Carrillo-daughter [phone] Other Supports: Two granddaughters' here for the holidays Anticipated Caregiver: daughter-Douglas Ability/Limitations of Caregiver: Douglas Carrillo is a part time Therapist, sports and can be available next week Caregiver Availability: Other (Comment) (Short term 24 hr) Family Dynamics: Close family daughter lives a minute from pt and grandaughter's here for the holidays. Douglas Carrillo will do whatever is needed to assist Dad.  Pt wants to be independent and get back to his actvie lifestyle.  Social History Preferred language: English Religion: Presbyterian Cultural Background: No issues Education: [employer] Educated Read: Yes Write: Yes Employment Status: Retired Freight forwarder Issues: No issues Guardian/Conservator: None-according to MD pt is capable of making his own decisions while here. Daughter very involved also.    Abuse/Neglect Physical Abuse: Denies Verbal Abuse: Denies Sexual Abuse: Denies Exploitation of patient/patient's resources: Denies Self-Neglect: Denies  Emotional Status Pt's affect, behavior adn adjustment status: Pt is motivated to do well here. He reports: " I am a little wobbly today, so need to get better with this."  Daughter and granddaughter's here observing in therapies. All very supportive of pt. He is willing to do whatever he needs to do to improve while here. Recent Psychosocial Issues: Other health issues but has managed at home independently Pyschiatric History: No history deferred depression screen due to pt is coping appropriately here. He is optimistic and has a strong faith, he feels at peace with all of this.  Daughter reports: " He is back to his old self." Substance Abuse History: No issues  Patient / Family Perceptions, Expectations & Goals Pt/Family understanding of illness & functional limitations: Pt and daughter are able to explain his condition and stroke deficits. His balance has been affected and this needs to improve. They both talk with the MD's and feel their questions and concerns are being addressed. Premorbid pt/family roles/activities: Father, grandfather, walker, church member, retiree, etc Anticipated changes in roles/activities/participation: plans to resume at discharge Pt/family expectations/goals: Pt states: " I want to be able to live alone again, maybe a little while have someone there, but after that alone."  Douglas Carrillo states: " I will stay with him if needed. But he is head strong and will do on his own."  [employer]: Other (Comment) (Cardiac rehab in the past) Premorbid Home Care/DME Agencies: Other (Comment) (had in past) Transportation available at discharge: Daughter and friends Resource referrals recommended: Support group (specify)  Discharge Planning Living Arrangements: Alone Support Systems: Children,  Water engineer, Social worker community Type of Residence: Private residence Insurance Resources: [employer], Multimedia programmer (specify) Fish farm manager) Financial Resources: Radio broadcast assistant Screen Referred: No Living Expenses: Own Money Management: Patient Does the patient have any problems obtaining your medications?: No Home Management: Patient and daughter assists if needed Patient/Family Preliminary Plans: Return home with daughter staying with him for a short time, he wants to be able to regain his independence and be able to be alone and manage. Daugther does live very close by and can be there quickly, but is aware he will need someone right there at first. Social Work Anticipated Follow Up Needs: HH/OP, Support Group  Clinical Impression Very pleasant gentleman who is motivated to do well and is aware of his deficits he has and needs to improve on. Supportive daughter and granddaughter's how are willing to provide assistance at discharge. Daughter is ware receommendation upon discharge is 24 hr supervision and then to go from there, pt wants to eventually be able to stay alone like was prior to admission. Needs to get steadier on his feet before this can be safe at home. Daugthter plans to be involved and participate in therapies with him. Will work on safe discharge plan.  Elease Hashimoto 12/18/2015, 1:36 PM

## 2015-12-18 NOTE — Progress Notes (Signed)
Physical Therapy Session Note  Patient Details  Name: Douglas Carrillo MRN: [id number] Date of Birth: 09-07-1928  Today's Date: 12/18/2015 PT Individual Time: 1111-1210 PT Individual Time Calculation (min): 59 min   Short Term Goals: Week 1:  PT Short Term Goal 1 (Week 1): Pt will perform bed mobility mod I from flat bed PT Short Term Goal 2 (Week 1): Pt will perform stand pivot transfer consistent S PT Short Term Goal 3 (Week 1): Pt will perform gait with LRAD x100' with min guard and LRAD PT Short Term Goal 4 (Week 1): Pt will perform ascent/descent of 4 6-inch stairs with minA  Skilled Therapeutic Interventions/Progress Updates:   Received in w/c with visitors present; pt just finished with first PT session but willing to participate in second session for am.  Performed w/c mobility to gym with bilat UE with min A to sequence changes in direction and to maintain straight navigation.  Performed transfer w/c > mat with mod A with continued verbal cues for safe hand placement and R foot placement; vitals assessed while seated on mat with continued bradycardia but pt not symptomatic.  Daughter present to observe.  Performed gait with RW  X 25' with min-mod A with verbal cues to maintain safe distance to RW and to maintain widened BOS.  Discussed with pt stair set up at home for entry/exit; pt reports 2 STE, no rails.  Performed stair negotiation training up/down 4 stairs with step to sequence and mod A overall for weight shifting and cuing for RLE hip and knee flexion for full clearance beginning with 2 rails progressing to >> 1 rail on L.  Pt very fatigued after each repetition and required a prolonged seated rest break.  Progress to standing alternating LE taps to 6" step with no UE support but mod-max A for balance.  Pt reported increasing difficulty in stair negotiation transitioning from 2 rails >> No rails.  During rest breaks discussed with pt and daughter installation of rails on both sides;  daughter verbalized agreement.  Performed NMR in hallway; see below.  At end of session performed stand pivot transfer back to recliner with RW and mod A.  Pt left with daughter present and all items within reach.  Therapy Documentation Precautions:  Precautions Precautions: Fall Restrictions Weight Bearing Restrictions: No Vital Signs: BP:148/70, HR: 46 bpm, 98% Sp02 at rest; HR increased to 90 with stair negotiation and 58 after NMR in hallway. Pain: Pain Assessment Pain Score: 2  Pain Location: Hip Pain Orientation: Right;Upper Pain Intervention(s): Refused Other Treatments: Treatments Neuromuscular Facilitation: Right;Upper Extremity;Lower Extremity;Activity to increase coordination;Activity to increase motor control;Activity to increase timing and sequencing;Activity to increase sustained activation;Activity to increase lateral weight shifting;Activity to increase anterior-posterior weight shifting lateral stepping to L and R x 25' each direction in hallway with UE support on rail with min-mod A and max verbal and tactile cues for upright posture, maintaining COG over BOS, full lateral weight shifting and R foot clearance/advancement.      See Function Navigator for Current Functional Status.   Therapy/Group: Individual Therapy  Raylene Everts [facility] 12/18/2015, 12:49 PM

## 2015-12-18 NOTE — Care Management Note (Signed)
[facility] Individual Statement of Services  Patient Name:  Douglas Carrillo  Date:  12/18/2015  Welcome to the [facility].  Our goal is to provide you with an individualized program based on your diagnosis and situation, designed to meet your specific needs.  With this comprehensive rehabilitation program, you will be expected to participate in at least 3 hours of rehabilitation therapies Monday-Friday, with modified therapy programming on the weekends.  Your rehabilitation program will include the following services:  Physical Therapy (PT), Occupational Therapy (OT), 24 hour per day rehabilitation nursing, Therapeutic Recreaction (TR), Case Management (Social Worker), Rehabilitation Medicine, Nutrition Services and Pharmacy Services  Weekly team conferences will be held on Wednesday to discuss your progress.  Your Social Worker will talk with you frequently to get your input and to update you on team discussions.  Team conferences with you and your family in attendance may also be held.  Expected length of stay: 12-14 days  Overall anticipated outcome: supervision with cueing  Depending on your progress and recovery, your program may change. Your Social Worker will coordinate services and will keep you informed of any changes. Your Social Worker's name and contact numbers are listed  below.  The following services may also be recommended but are not provided by the [facility] will be made to provide these services after discharge if needed.  Arrangements include referral to agencies that provide these services.  Your insurance has been verified to be:  [employer] Your primary doctor is:  [name]  Pertinent information will be shared with your doctor and your insurance company.  Social Worker:  Ovidio Kin, [place] or ([phone]  Information discussed with and copy given to patient by: Elease Hashimoto, 12/18/2015, 11:35 AM

## 2015-12-18 NOTE — Progress Notes (Signed)
[facility] PHYSICAL MEDICINE & REHABILITATION     PROGRESS NOTE    Subjective/Complaints:  Patient seen this morning resting comfortably in bed. He slept well overnight. He feels a sensation in his right side is improving. He also mentions he is very tired from therapies yesterday, but is ready to start again today.  ROS: Denies CP, SOB, N/V/D.  Objective: Vital Signs: Blood pressure 165/66, pulse 88, temperature 98.6 F (37 C), temperature source Oral, resp. rate 18, SpO2 100 %. No results found.  Recent Labs  12/16/15 0647  WBC 4.1  HGB 8.4*  HCT 27.4*  PLT 198    Recent Labs  12/16/15 0647  NA 142  K 3.9  CL 110  GLUCOSE 143*  BUN 16  CREATININE 1.17  CALCIUM 8.8*   CBG (last 3)   Recent Labs  12/17/15 1648 12/17/15 2058 12/18/15 0645  GLUCAP 121* 129* 116*    Wt Readings from Last 3 Encounters:  12/13/15 92.987 kg (205 lb)  12/12/15 92.987 kg (205 lb)  12/11/15 94.348 kg (208 lb)    Physical Exam:  BP 165/66 mmHg  Pulse 88  Temp(Src) 98.6 F (37 C) (Oral)  Resp 18  SpO2 100% Constitutional: He appears well-developed and well-nourished. NAD. Vital signs reviewed.  Appears younger than stated age.  HENT: Normocephalic and atraumatic.  Mouth/Throat: No oropharyngeal exudate.  Eyes: Conjunctivae and EOM are normal. No scleral icterus.  Cardiovascular: Normal rate and regular rhythm. Holosystolic murmur.  Respiratory: Effort normal and breath sounds normal. No stridor. He exhibits no tenderness.  GI: Soft. Bowel sounds are normal. He exhibits no distension. There is no tenderness.  Musculoskeletal: He exhibits edema (right ankle with trace edema). He exhibits no tenderness.  Neurological: He is alert and oriented to person, place, and time.  Right facial weakness with minimal dysarthria.  Able to follow one and two step motor commands without difficulty.  Sensation diminished to light touch RUE/RLE (improving) Motor: LUE/LLE:  5/5 RUE: shoulder abduction 4/5, elbow flexion 4/5, elbow extension 3+/5, hand grip 3+/5 RLE: hip flexion 2+/5, ankle dorsi/plantar flexion 2/5  Skin: Skin is warm and dry. No rash noted.  Psychiatric: He has a normal mood and affect. His behavior is normal. Judgment and thought content normal.    Assessment/Plan: 1. 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 Physiatrist is providing close team supervision and 24 hour management of active medical problems listed below. Physiatrist and rehab team continue to assess barriers to discharge/monitor patient progress toward functional and medical goals.  Function:  Bathing Bathing position   Position: Shower  Bathing parts Body parts bathed by patient: Right arm, Right upper leg, Left arm, Left upper leg, Chest, Abdomen, Front perineal area, Left lower leg, Buttocks, Back Body parts bathed by helper: Right lower leg  Bathing assist Assist Level: Touching or steadying assistance(Pt > 75%)      Upper Body Dressing/Undressing Upper body dressing   What is the patient wearing?: Pull over shirt/dress     Pull over shirt/dress - Perfomed by patient: Thread/unthread right sleeve, Thread/unthread left sleeve, Put head through opening, Pull shirt over trunk          Upper body assist Assist Level: Set up   Set up : To obtain clothing/put away  Lower Body Dressing/Undressing Lower body dressing   What is the patient wearing?: Pants, Non-skid slipper socks     Pants-  Performed by patient: Thread/unthread left pants leg, Pull pants up/down Pants- Performed by helper: Thread/unthread right pants leg Non-skid slipper socks- Performed by patient: Don/doff right sock, Don/doff left sock                    Lower body assist Assist for lower body dressing: Touching or steadying assistance (Pt > 75%)       Toileting Toileting   Toileting steps completed by patient: Adjust clothing prior to toileting, Performs perineal hygiene, Adjust clothing after toileting   Toileting Assistive Devices: Grab bar or rail  Toileting assist Assist level: Touching or steadying assistance (Pt.75%)   Transfers Chair/bed transfer   Chair/bed transfer method: Stand pivot Chair/bed transfer assist level: Touching or steadying assistance (Pt > 75%) Chair/bed transfer assistive device: Armrests     Locomotion Ambulation     Max distance: 60 Assist level: Touching or steadying assistance (Pt > 75%)   Wheelchair   Type: Manual Max wheelchair distance: 150 Assist Level: Supervision or verbal cues  Cognition Comprehension Comprehension assist level: Understands basic 90% of the time/cues < 10% of the time  Expression Expression assist level: Expresses basic needs/ideas: With extra time/assistive device  Social Interaction Social Interaction assist level: Interacts appropriately 90% of the time - Needs monitoring or encouragement for participation or interaction.  Problem Solving Problem solving assist level: Solves basic problems with no assist  Memory Memory assist level: Recognizes or recalls 90% of the time/requires cueing < 10% of the time    Medical Problem List and Plan: 1. 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 Cont to modify secondary risk factors 2. DVT Prophylaxis/Anticoagulation: Mechanical: Sequential compression devices, below knee Bilateral lower extremities, Pharmaceutical: Lovenox 3. Pain Management: Tylenol and Norco PRN.  4. Mood: LCSW to follow for evaluation and support 5. Neuropsych: This patient is capable of making decisions on his own behalf. 6. Skin/Wound Care: Maintain adequate nutrition and hydration status. Routine pressure relief measures.  7. Fluids/Electrolytes/Nutrition: Monitor I/O. Offer supplements if intake poor.               Protein supplement added  8. HTN Will monitor and adjust at rest and in accordance with therapies Monitor VS during therapies and at rest and adjust as indicated to ensure goal BP maintained Continue medications, will restart home medications as necessary             Will start Imdur 30 (home dose 60) today 9. DM HbA1c 6.8 on 12/22 FSBS trend:  CBG (last 3)   Recent Labs  12/17/15 1648 12/17/15 2058 12/18/15 0645  GLUCAP 121* 129* 116*   Follow FSBS while in rehab and adjust regimen as indicated goal FSBS 70-160 Diabetic Diet Hypoglycemia protocol in place Will manage to avoid hypo/hyperglycemic episodes as therapies progress If patient eats < 50% of meal hold scheduled meal time insulin. If eats >50% but < 75% give 1/2 dose of scheduled insulin, and if eats >75% then give full dose of insulin Will restart home medications as necessary 10. CAD s/p recent CABG Monitor in accordance with increased physical activity and avoid UE resistance excercises 11. OSA Cont O2 at night Evaluate signs of daytime somnolence. 12. AS See CAD 13. Anemia Hb 8.4 on 12/24  Will periodically monitor Hb to ensure adequate perfusion and energy for therapies Will transfuse if pt becomes symptomatic and/or Hb <7.0 14. Spastic bladder Cont Ditropan 15. GERD Cont Protonix 16. Hypokalemia: Normalized after supplementation Cont K+ supplement 3.9  on 12/24 Will monitor periodically 17. COPD Cont Spiriva       LOS (Days) 3 A FACE TO FACE EVALUATION WAS PERFORMED  Richardine Peppers Lorie Phenix 12/18/2015 8:30 AM

## 2015-12-18 NOTE — Progress Notes (Signed)
Physical Therapy Session Note  Patient Details  Name: Douglas Carrillo MRN: [id number] Date of Birth: 1928/05/16  Today's Date: 12/18/2015 PT Individual Time: 1005-1050; [id number] PT Individual Time Calculation (min): 45 min, 37 min   Short Term Goals: Week 1:  PT Short Term Goal 1 (Week 1): Pt will perform bed mobility mod I from flat bed PT Short Term Goal 2 (Week 1): Pt will perform stand pivot transfer consistent S PT Short Term Goal 3 (Week 1): Pt will perform gait with LRAD x100' with min guard and LRAD PT Short Term Goal 4 (Week 1): Pt will perform ascent/descent of 4 6-inch stairs with minA  Skilled Therapeutic Interventions/Progress Updates:  tx 1:  Stand pivot recliner> w/c and w/c> toilet to R. Pt was continent of urine on toilet.  PT advised pt to drink more liquids due to dark yellow color of urine. NT informed.  Pt stood at sink x 7 minutes for hand washing and brushing teeth; limited by DOE. Min assist for balance.  W/c propulsion using bil UEs for neuro re-ed of RUE with supervision for technique on turns.  Pt education on components and biomechanics of transfer, including R foot placement, reciprocal scooting to front of seat, forward wt shift.   neuromuscular re-education via forced use, manual cues, demo for reciprocal scooting for pelvic dissociation and RLE loading during sit>< stand with L foot on 4" high stool, blocked practice for each.  Pt returned to room and left with all needs in place; nieces present in room.  tx 2:  dtr and 2 granddaughters present and observed tx; toilet transfer via gait in room with RW.  dtr stated pt's BR is small with sink and cabinet to L of toilet.  PT requested family take a picture of BR at home. Gait in hall on level tile with min guard assist, mod cues for wider BOS as pt's heels bump together q step.  Safety plan updated for gait with therapists only for now.  neuromuscular re-education via demo, manual and VCs for standing at hallway  rail to perform [place] A exs: 10 x 1 each mini squats, calf raises with RUE on wall to facilitate trunk extension, R/L hip abduction with assistance R, also R hip IR in standing and sitting.  Instructed pt in bil hip IR in sitting in recliner at rest.  At end of session, pt left sitting in recliner, on w/c cushion to elevate seat, with all needs within reach.  Dtr stated pt had not had BM in 3 days, was in R hip pain and needed pain meds.  PT informed RN.     Therapy Documentation Precautions:  Precautions Precautions: Fall Restrictions Weight Bearing Restrictions: No   Vital Signs: Oxygen Therapy SpO2: 99 % O2 Device: Not Delivered, on room air HR 99bpm Pain: Pain Assessment Pain Score: 2  Pain Location: Hip Pain Orientation: Right;Upper Pain Intervention(s): Refused AM; RN informed in PM.     See Function Navigator for Current Functional Status.   Therapy/Group: Individual Therapy  Velena Keegan 12/18/2015, 11:01 AM

## 2015-12-18 NOTE — IPOC Note (Addendum)
Overall Plan of Care [facility]) Patient Details Name: Douglas Carrillo MRN: [id number] DOB: [birth year]  Admitting Diagnosis: L CVA  Hospital Problems: Active Problems:   CAD (coronary artery disease) of artery bypass graft   Hyperlipidemia with target LDL less than 70   Essential hypertension   DM2 (diabetes mellitus, type 2) (HCC)   Aortic valve stenosis   COPD (chronic obstructive pulmonary disease) (HCC)   Nocturnal hypoxia   OSA (obstructive sleep apnea)   Prolonged Q-T interval on ECG   Stroke, acute, thrombotic (HCC)   Hemiparesis affecting dominant side as late effect of stroke ([place])   Anemia   Spastic neurogenic bladder   GERD (gastroesophageal reflux disease)   Hypokalemia   Functional Problem List: Nursing Endurance, Medication Management, Motor, Pain, Safety, Sensory, Skin Integrity  PT Balance, Perception, Pain, Safety, Endurance, Sensory, Motor  OT Balance, Endurance, Safety, Perception, Motor, Sensory  SLP    TR         Basic ADL's: OT Bathing, Dressing, Toileting, Eating, Grooming     Advanced  ADL's: OT Simple Meal Preparation     Transfers: PT Bed Mobility, Bed to Chair, Furniture, Musician, Futures trader, Metallurgist: PT Ambulation, Emergency planning/management officer, Stairs     Additional Impairments: OT Fuctional Use of Upper Extremity  SLP        TR      Anticipated Outcomes Item Anticipated Outcome  Self Feeding Mod I  Swallowing      Basic self-care  Mod I  Toileting  Supervision- Mod I   Bathroom Transfers Supervision  Bowel/Bladder  mod I  Transfers  mod I  Locomotion  S with LRAD  Communication     Cognition     Pain  less than 2  Safety/Judgment  mod I   Therapy Plan: PT Intensity: Minimum of 1-2 x/day ,45 to 90 minutes PT Frequency: 5 out of 7 days PT Duration Estimated Length of Stay: 12-14 days OT Intensity: Minimum of 1-2 x/day, 45 to 90 minutes OT Frequency: 5 out of 7 days OT Duration/Estimated Length of Stay:  10-12 days         Team Interventions: Nursing Interventions Patient/Family Education, Disease Management/Prevention, Pain Management, Medication Management, Discharge Planning  PT interventions Ambulation/gait training, Community reintegration, Training and development officer, Disease management/prevention, Discharge planning, DME/adaptive equipment instruction, Functional electrical stimulation, Neuromuscular re-education, Functional mobility training, Pain management, Patient/family education, Psychosocial support, Splinting/orthotics, Stair training, Therapeutic Exercise, Therapeutic Activities, UE/LE Strength taining/ROM, UE/LE Coordination activities, Wheelchair propulsion/positioning  OT Interventions Training and development officer, Discharge planning, Community reintegration, Engineer, drilling, Disease mangement/prevention, Functional mobility training, Neuromuscular re-education, Patient/family education, Psychosocial support, Self Care/advanced ADL retraining, Therapeutic Activities, Splinting/orthotics, Therapeutic Exercise, UE/LE Strength taining/ROM, UE/LE Coordination activities  SLP Interventions    TR Interventions    SW/CM Interventions  Discharge Planning, Psychosocial Assessment and Family/pt education    Team Discharge Planning: Destination: PT-Home ,OT- Home , SLP-  Projected Follow-up: PT-Outpatient PT, OT-  Home health OT, Outpatient OT, SLP-  Projected Equipment Needs: PT-To be determined, OT- To be determined, 3 in 1 bedside comode, Tub/shower bench, SLP-  Equipment Details: PT- , OT-  Patient/family involved in discharge planning: PT- Patient, Family member/caregiver,  OT-Patient, Family member/caregiver, SLP-   MD ELOS: 12-14 days Medical Rehab Prognosis:  Good Assessment: 81 y.o. right handed male with history of HTN, DM type2, CABG, OSA--oxygen at nights, severe aortic stenosis with recent onset of SOB and cardiac cath 12/20 with transient right arm and  leg weakness post procedure likely due to embolic infarct. He was discharged to home but readmitted the next day on 12/21 am with fall out of bed due to right leg weakness and decreased sensation right half of his body. MRI brain done revealing acute infarct along ventral surface of left medulla and MRA brain/head without significant stenosis. Dr. Leonie Man consulted and felt that stroke due to sequela of cardiac cath and to continue Plavix for secondary stroke prevention. Carotid dopplers without significant ICA stenosis. Patient with resultant mild right facial droop, RLE weakness affecting balance and mobility as well as hypoxia with activity.   See Team Conference Notes for weekly updates to the plan of care

## 2015-12-18 NOTE — Progress Notes (Signed)
Occupational Therapy Session Note  Patient Details  Name: Douglas Carrillo MRN: [id number] Date of Birth: November 18, 1928  Today's Date: 12/18/2015 OT Individual Time: 1400-1500 OT Individual Time Calculation (min): 60 min    Short Term Goals: Week 1:  OT Short Term Goal 1 (Week 1): Pt will complete toilet transfer (BSC over toilet) with min A OT Short Term Goal 2 (Week 1): Pt will maintain dynamic standing balance during LB dressing task with min A OT Short Term Goal 3 (Week 1): Pt will complete 2 groomings tasks in standing with supervision to increase functional activity tolerance OT Short Term Goal 4 (Week 1): Pt will use R UE at stabilizer level mod I  Skilled Therapeutic Interventions/Progress Updates:    Pt seen for OT ADL bathing/ dressing session. Pt sitting up in recliner upon arrival, agreeable to tx session. He ambulated throughout room with min-mod A due to decreased balance with RW. He bathed seated on tub bench with supervision, using lateral leans to complete buttock hygiene. He dressed seated in recliner, requiring increased time and rest breaks during functional tasks. Pt noted to have increased edema in R ankle. RN made aware and order for TED hose placed.  Pt provided with handout of theraputty exercises with demonstration provided for additional exercises. He then completed 9 hole peg test. See results below.  Pt left sitting in recliner at end of session, all needs in reach.   9 hole peg test: R: 58 sec L: 36  Therapy Documentation Precautions:  Precautions Precautions: Fall Restrictions Weight Bearing Restrictions: No Pain:   No/ denies pain  See Function Navigator for Current Functional Status.   Therapy/Group: Individual Therapy  Lewis, Drayton Tieu C 12/18/2015, 7:23 AM

## 2015-12-19 ENCOUNTER — Inpatient Hospital Stay (HOSPITAL_COMMUNITY): Payer: Medicare Other | Admitting: Occupational Therapy

## 2015-12-19 ENCOUNTER — Inpatient Hospital Stay (HOSPITAL_COMMUNITY): Payer: Medicare Other | Admitting: Physical Therapy

## 2015-12-19 LAB — GLUCOSE, CAPILLARY
GLUCOSE-CAPILLARY: 110 mg/dL — AB (ref 65–99)
GLUCOSE-CAPILLARY: 103 mg/dL — AB (ref 65–99)
Glucose-Capillary: 118 mg/dL — ABNORMAL HIGH (ref 65–99)
Glucose-Capillary: 106 mg/dL — ABNORMAL HIGH (ref 65–99)

## 2015-12-19 MED ORDER — METOPROLOL SUCCINATE ER 25 MG PO TB24
12.5000 mg | ORAL_TABLET | Freq: Every day | ORAL | Status: DC
  Administered 2015-12-19 – 2015-12-22 (×4): 12.5 mg via ORAL
  Filled 2015-12-19 (×4): qty 1

## 2015-12-19 NOTE — Progress Notes (Signed)
[place] PHYSICAL MEDICINE & REHABILITATION     PROGRESS NOTE    Subjective/Complaints:  Pt states he's had a good session with OT, no other issues overnite, was able to recall 1/3 exercises  ROS: Denies CP, SOB, N/V/D.no bladder issues  Objective: Vital Signs: Blood pressure 164/66, pulse 92, temperature 98.5 F (36.9 C), temperature source Oral, resp. rate 16, SpO2 100 %. No results found. No results for input(s): WBC, HGB, HCT, PLT in the last 72 hours. No results for input(s): NA, K, CL, GLUCOSE, BUN, CREATININE, CALCIUM in the last 72 hours.  Invalid input(s): CO CBG (last 3)   Recent Labs  12/18/15 1635 12/18/15 2045 12/19/15 0656  GLUCAP 103* 132* 118*    Wt Readings from Last 3 Encounters:  12/13/15 92.987 kg (205 lb)  12/12/15 92.987 kg (205 lb)  12/11/15 94.348 kg (208 lb)    Physical Exam:  BP 164/66 mmHg  Pulse 92  Temp(Src) 98.5 F (36.9 C) (Oral)  Resp 16  SpO2 100% Constitutional: He appears well-developed and well-nourished. NAD. Vital signs reviewed.  Appears younger than stated age.  HENT: Normocephalic and atraumatic.  Mouth/Throat: No oropharyngeal exudate.  Eyes: Conjunctivae and EOM are normal. No scleral icterus.  Cardiovascular: Normal rate and regular rhythm. Holosystolic murmur.  Respiratory: Effort normal and breath sounds normal. No stridor. He exhibits no tenderness.  GI: Soft. Bowel sounds are normal. He exhibits no distension. There is no tenderness.  Musculoskeletal: He exhibits edema (right ankle with trace edema). He exhibits no tenderness.  Neurological: He is alert and oriented to person, place, and time.  Right facial weakness with minimal dysarthria.  Able to follow one and two step motor commands without difficulty.  Sensation diminished to light touch RUE/RLE (improving) Motor: LUE/LLE: 5/5 RUE: shoulder abduction 4/5, elbow flexion 4/5, elbow extension 3+/5, hand grip 3+/5 RLE: hip flexion 2+/5, ankle  dorsi/plantar flexion 2/5  Skin: Skin is warm and dry. No rash noted.  Psychiatric: He has a normal mood and affect. His behavior is normal. Judgment and thought content normal.    Assessment/Plan: 1. 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 Physiatrist is providing close team supervision and 24 hour management of active medical problems listed below. Physiatrist and rehab team continue to assess barriers to discharge/monitor patient progress toward functional and medical goals.  Function:  Bathing Bathing position   Position: Shower  Bathing parts Body parts bathed by patient: Right arm, Right upper leg, Left arm, Left upper leg, Chest, Abdomen, Front perineal area, Left lower leg, Buttocks, Back, Right lower leg Body parts bathed by helper: Right lower leg  Bathing assist Assist Level: Supervision or verbal cues      Upper Body Dressing/Undressing Upper body dressing   What is the patient wearing?: Pull over shirt/dress     Pull over shirt/dress - Perfomed by patient: Thread/unthread right sleeve, Thread/unthread left sleeve, Put head through opening, Pull shirt over trunk          Upper body assist Assist Level: Set up   Set up : To obtain clothing/put away  Lower Body Dressing/Undressing Lower body dressing   What is the patient wearing?: Underwear, Pants, Socks, Shoes Underwear - Performed by patient: Thread/unthread right underwear leg, Thread/unthread left underwear leg, Pull underwear up/down   Pants- Performed by patient: Thread/unthread right pants leg, Thread/unthread left pants leg, Pull pants up/down Pants- Performed by helper: Thread/unthread  right pants leg Non-skid slipper socks- Performed by patient: Don/doff right sock, Don/doff left sock   Socks - Performed by patient: Don/doff right sock, Don/doff left sock   Shoes - Performed by  patient: Don/doff left shoe, Fasten right, Fasten left, Don/doff right shoe            Lower body assist Assist for lower body dressing: Touching or steadying assistance (Pt > 75%)      Toileting Toileting   Toileting steps completed by patient: Adjust clothing prior to toileting, Performs perineal hygiene, Adjust clothing after toileting   Toileting Assistive Devices: Grab bar or rail  Toileting assist Assist level: Touching or steadying assistance (Pt.75%)   Transfers Chair/bed transfer   Chair/bed transfer method: Stand pivot Chair/bed transfer assist level: Moderate assist (Pt 50 - 74%/lift or lower) Chair/bed transfer assistive device: Medical sales representative     Max distance: 70 Assist level: Touching or steadying assistance (Pt > 75%)   Wheelchair   Type: Manual Max wheelchair distance: 160 Assist Level: Touching or steadying assistance (Pt > 75%)  Cognition Comprehension Comprehension assist level: Understands basic 90% of the time/cues < 10% of the time  Expression Expression assist level: Expresses basic 90% of the time/requires cueing < 10% of the time.  Social Interaction Social Interaction assist level: Interacts appropriately 90% of the time - Needs monitoring or encouragement for participation or interaction.  Problem Solving Problem solving assist level: Solves basic 90% of the time/requires cueing < 10% of the time  Memory Memory assist level: Recognizes or recalls 90% of the time/requires cueing < 10% of the time    Medical Problem List and Plan: 1. 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 Cont to modify secondary risk factors 2. DVT Prophylaxis/Anticoagulation: Mechanical: Sequential compression devices, below knee Bilateral lower extremities, Pharmaceutical: Lovenox 3. Pain Management: Tylenol and Norco PRN.  4. Mood: LCSW to follow for evaluation and support 5. Neuropsych: This patient is capable of making  decisions on his own behalf. 6. Skin/Wound Care: Maintain adequate nutrition and hydration status. Routine pressure relief measures.  7. Fluids/Electrolytes/Nutrition: Monitor I/O. Offer supplements if intake poor.              Protein supplement added  8. HTN Increase BP meds today Monitor VS during therapies and at rest and adjust as indicated to ensure goal BP maintained Continue medications, will restart home medications as necessary             Will start Imdur 30 (home dose 60) today 9. DM HbA1c 6.8 on 12/22 FSBS trend:  CBG (last 3)   Recent Labs  12/18/15 1635 12/18/15 2045 12/19/15 0656  GLUCAP 103* 132* 118*   Follow FSBS while in rehab and adjust regimen as indicated goal FSBS 70-160 Diabetic Diet Hypoglycemia protocol in place Will manage to avoid hypo/hyperglycemic episodes as therapies progress If patient eats < 50% of meal hold scheduled meal time insulin. If eats >50% but < 75% give 1/2 dose of scheduled insulin, and if eats >75% then give full dose of insulin Will restart home medications as necessary 10. CAD s/p recent CABG Monitor in accordance with increased physical activity and avoid UE resistance excercises 11. OSA Cont O2 at night Evaluate signs of daytime somnolence. 12. AS See CAD 13. Anemia Hb 8.4 on 12/24  Will periodically monitor Hb to ensure adequate perfusion and energy for therapies Will transfuse if pt becomes symptomatic and/or Hb <7.0 14. Spastic bladder  Cont Ditropan 15. GERD Cont Protonix 16. Hypokalemia: Normalized after supplementation Cont K+ supplement 3.9 on 12/24 Will monitor periodically 17. COPD Cont Spiriva- currently asymptomatic        LOS (Days) 4 A FACE TO FACE EVALUATION WAS PERFORMED  Charlett Blake 12/19/2015 7:46 AM

## 2015-12-19 NOTE — Progress Notes (Signed)
Occupational Therapy Session Note  Patient Details  Name: Douglas Carrillo MRN: [id number] Date of Birth: [birth year]  Today's Date: 12/19/2015 OT Individual Time: [id number] and 1425-1445 OT Individual Time Calculation (min): 75 min and 20 min   Short Term Goals: Week 1:  OT Short Term Goal 1 (Week 1): Pt will complete toilet transfer (BSC over toilet) with min A OT Short Term Goal 2 (Week 1): Pt will maintain dynamic standing balance during LB dressing task with min A OT Short Term Goal 3 (Week 1): Pt will complete 2 groomings tasks in standing with supervision to increase functional activity tolerance OT Short Term Goal 4 (Week 1): Pt will use R UE at stabilizer level mod I  Skilled Therapeutic Interventions/Progress Updates:    1) Treatment session with focus on dynamic standing balance, trunk control, and RUE NMR.  Grooming completed in standing at sink with use of RUE as dominant UE with even brushing teeth 100% with Rt hand.  Provided min/steady assist in standing due to Rt lean.  Trunk control and RUE NMR with sit > stand, reaching across midline with RUE to obtain horse shoe and reaching up and outside BOS to Rt to place on basketball hoop.  Pt required min assist for dynamic standing balance when reaching outside BOS to Rt and tactile cues at trunk and ribcage for upright reaching. Engaged in [facility] in standing with opening pill bottles and filling pill box, again requiring cues for upright standing balance and to correct Rt lean.  Seated activity with RUE [place] to obtain small items from resistive theraputty.  Completed 9 hole peg test Rt: 41 seconds and Lt: 33 seconds.  Assessed grip strength with Rt: 60# and Lt: 85#.  2) Treatment session with focus on dominant RUE.  Provided handouts to address fine motor coordination and handwriting with dot to dot and maze activities.  Pt's daughter present and reports handwriting is improved from a few days ago.  Encouraged use of [facility] HEP handout provided  and left pt with additional puzzles for pre-handwriting activities.  Discussed various pen grips and weights to improve success with handwriting.  Therapy Documentation Precautions:  Precautions Precautions: Fall Restrictions Weight Bearing Restrictions: No General:   Vital Signs: Therapy Vitals Temp: 98.5 F (36.9 C) Temp Source: Oral Pulse Rate: 92 Resp: 16 BP: (!) 164/66 mmHg Patient Position (if appropriate): Lying Oxygen Therapy SpO2: 100 % O2 Device: Nasal Cannula O2 Flow Rate (L/min): 2 L/min Pain:  Pt with no c/o pain  See Function Navigator for Current Functional Status.   Therapy/Group: Individual Therapy  [name] 12/19/2015, 8:38 AM

## 2015-12-19 NOTE — Progress Notes (Signed)
Physical Therapy Note  Patient Details  Name: Douglas Carrillo MRN: [id number] Date of Birth: [birth year] Today's Date: 12/19/2015    Time: 1300-1345 45 minutes  1:1 No c/o pain.  Gait with RW with min A and manual facilitation for wt shifts, verbal cues for R LE clearance 2 x 25'.  Standing NMR with focus on R LE strength and coordination. Tap ups to foam cone with RW for balance with pt able to perform 8 taps on R LE before fatigued, min A for balance. Tap ups with L LE for R stance control with min A.  With 1 UE Support tapping on ground level with mod A for balance.  Forward and lateral Step ups to 4'' step with RW with R LE for strengthening with min A.  Pt requires frequent rests due to R LE fatigue but is motivated to improve.   Audley Hinojos 12/19/2015, 2:25 PM

## 2015-12-19 NOTE — Progress Notes (Signed)
Occupational Therapy Session Note  Patient Details  Name: Douglas Carrillo MRN: [id number] Date of Birth: 22-Dec-1928  Today's Date: 12/19/2015 OT Individual Time: 1100-1205 OT Individual Time Calculation (min): 65 min    Short Term Goals: Week 1:  OT Short Term Goal 1 (Week 1): Pt will complete toilet transfer (BSC over toilet) with min A OT Short Term Goal 2 (Week 1): Pt will maintain dynamic standing balance during LB dressing task with min A OT Short Term Goal 3 (Week 1): Pt will complete 2 groomings tasks in standing with supervision to increase functional activity tolerance OT Short Term Goal 4 (Week 1): Pt will use R UE at stabilizer level mod I  Skilled Therapeutic Interventions/Progress Updates:    Pt seen for skilled OT to facilitate RUE/RLE coordination, balance and activity tolerance with ADL retraining. Pt's daughter present for session and education. Pt used RW to ambulate around room with verbal cues and steadying assist as he would often lean too far to R pushing past his BOS. Cued for safe stride length and foot placement. Showered with supervision and needed some assist with R shoe due to R foot edema. Ted hose donned.  Use R hand well to open containers, tie shoes.   Daughter had photos of home bathroom set up. Set up tub room in the same layout as home. Discussed use of tub bench and possibly use of BSC over toilet or safety rails. Toilet height will need to be measured. If pt can push up with R arm on his vanity from toilet , he will not need BSC over toilet. Will work on that skill in future sessions.  Pt and dtr returned to his room with all needs met.  Therapy Documentation Precautions:  Precautions Precautions: Fall Restrictions Weight Bearing Restrictions: No  Pain: Pain Assessment Pain Assessment: No/denies pain ADL:   See Function Navigator for Current Functional Status.   Therapy/Group: Individual Therapy  Douglas Carrillo 12/19/2015, 1:00 PM

## 2015-12-20 ENCOUNTER — Inpatient Hospital Stay (HOSPITAL_COMMUNITY): Payer: Medicare Other | Admitting: Occupational Therapy

## 2015-12-20 ENCOUNTER — Inpatient Hospital Stay (HOSPITAL_COMMUNITY): Payer: Medicare Other | Admitting: Physical Therapy

## 2015-12-20 ENCOUNTER — Inpatient Hospital Stay (HOSPITAL_COMMUNITY): Payer: Medicare Other

## 2015-12-20 ENCOUNTER — Encounter: Payer: Medicare Other | Admitting: Surgery

## 2015-12-20 LAB — GLUCOSE, CAPILLARY
GLUCOSE-CAPILLARY: 116 mg/dL — AB (ref 65–99)
GLUCOSE-CAPILLARY: 123 mg/dL — AB (ref 65–99)
Glucose-Capillary: 126 mg/dL — ABNORMAL HIGH (ref 65–99)
Glucose-Capillary: 136 mg/dL — ABNORMAL HIGH (ref 65–99)

## 2015-12-20 MED ORDER — SENNOSIDES-DOCUSATE SODIUM 8.6-50 MG PO TABS
2.0000 | ORAL_TABLET | Freq: Two times a day (BID) | ORAL | Status: DC
  Administered 2015-12-20 – 2015-12-21 (×3): 2 via ORAL
  Filled 2015-12-20 (×2): qty 2

## 2015-12-20 NOTE — Progress Notes (Signed)
Social Work Douglas Hashimoto, LCSW Social Worker Signed Physical Medicine and Rehabilitation Patient Care Conference 12/20/2015 12:33 PM    Expand All Collapse All   Inpatient RehabilitationTeam Conference and Plan of Care Update Date: 12/20/2015   Time: 11:00 AM     Patient Name: Douglas Carrillo       Medical Record Number: [id number]  Date of Birth: 1927-12-25 Sex: Male         Room/Bed: 4M09C/4M09C-01 Payor Info: Payor: MEDICARE / Plan: MEDICARE PART A AND B / Product Type: *No Product type* /    Admitting Diagnosis: L CVA   Admit Date/Time:  12/15/2015  5:44 PM Admission Comments: No comment available   Primary Diagnosis:  <principal problem not specified> Principal Problem: <principal problem not specified>    Patient Active Problem List     Diagnosis  Date Noted   .  Stroke, acute, thrombotic ([place])  12/15/2015   .  Hemiparesis affecting dominant side as late effect of stroke ([place])  12/15/2015   .  Anemia  12/15/2015   .  Spastic neurogenic bladder  12/15/2015   .  GERD (gastroesophageal reflux disease)  12/15/2015   .  Hypokalemia  12/15/2015   .  Cerebral infarction due to unspecified mechanism     .  CVA (cerebral infarction)  12/13/2015   .  Prolonged Q-T interval on ECG  12/13/2015   .  Right sided weakness  12/13/2015   .  Elevated troponin  12/13/2015   .  Acute kidney failure ([place])  12/13/2015   .  Normocytic anemia  12/13/2015   .  Diabetes mellitus with complication ([place])     .  CAD in native artery     .  Aortic stenosis     .  OSA (obstructive sleep apnea)  02/08/2015   .  COPD, moderate ([place])  01/12/2015   .  COPD (chronic obstructive pulmonary disease) ([place])  11/10/2014   .  Nocturnal hypoxia  11/10/2014   .  Dyspnea and respiratory abnormality  10/19/2014   .  Aortic valve stenosis  05/27/2014   .  CAD (coronary artery disease) of artery bypass graft  11/30/2013   .  CAD (coronary artery disease)  11/30/2013   .  Hyperlipidemia with target LDL less  than 70  11/30/2013   .  Essential hypertension  11/30/2013   .  DM2 (diabetes mellitus, type 2) ([place])  11/30/2013   .  RBBB  11/30/2013     Expected Discharge Date: Expected Discharge Date: 12/29/15  Team Members Present: Physician leading conference: Dr. Alysia Penna Social Worker Present: Ovidio Kin, LCSW Nurse Present: Dorien Chihuahua, RN PT Present: Jorge Mandril, PT OT Present: [name], OT SLP Present: Windell Moulding, SLP PPS Coordinator present : Daiva Nakayama, RN, CRRN        Current Status/Progress  Goal  Weekly Team Focus   Medical     cont of bladder, constipated  continenet and regular bowel and bladder   laxative adjustment   Bowel/Bladder     Continent of bowel and bladder. LBM 12/18/15   Pt to remain continent of bowel and bladder   Monitor   Swallow/Nutrition/ Hydration       na         ADL's     supervision bathing, min assist LB dressing, min assist transfers   Supervision/Mod I overall  RUE NMR, self-care retraining, dynamic standing balance    Mobility     min A  gait and transfers  supervision  R LE strength, coordination, balance, gait   Communication       na         Safety/Cognition/ Behavioral Observations      no unsafe behaviors         Pain     Tylenol 650mg  q 4hrs prn for back discomfort   <3  Monitor for effectiveness   Skin     CDI  CDI  Assess q shift      *See Care Plan and progress notes for long and short-term goals.    Barriers to Discharge:  loses balance to the right with reduced endurance      Possible Resolutions to Barriers:   will need to train to improve endurance      Discharge Planning/Teaching Needs:   Daughter to stay with pt when first goes home and then go from there. She has been here and attended therapies with dad        Team Discussion:    Goals supervision/mod/i level.  Loses balance to the right-pt aware. Fatigues easily. When first goes home 24 hr supervision recommended. Daughter has been here to  observe in therapies. Medically stable   Revisions to Treatment Plan:    None    Continued Need for Acute Rehabilitation Level of Care: The patient requires daily medical management by a physician with specialized training in physical medicine and rehabilitation for the following conditions: Daily direction of a multidisciplinary physical rehabilitation program to ensure safe treatment while eliciting the highest outcome that is of practical value to the patient.: Yes Daily medical management of patient stability for increased activity during participation in an intensive rehabilitation regime.: Yes Daily analysis of laboratory values and/or radiology reports with any subsequent need for medication adjustment of medical intervention for : Neurological problems;Other  Douglas Carrillo 12/20/2015, 12:33 PM                  Patient ID: Douglas Carrillo, male   DOB: [birth year], 81 y.o.   MRN: [id number]

## 2015-12-20 NOTE — Progress Notes (Signed)
Social Work Patient ID: Douglas Carrillo, male   DOB: 1928/08/18, 81 y.o.   MRN: [id number] Met with pt and daughter to discuss team conference goals-supervision/mod/i level and discharge target 1/6.  Both pleased with the plan and pt felt his session this am went very well. Daughter aware the recommendation is for her to stay with him a short time and then see how he does. She was asking for information for a ramp for his one step into home. He plans to have Someone come in and install grab bars in the bathroom. Daughter to continue to attend therapies to learn his care.

## 2015-12-20 NOTE — Progress Notes (Addendum)
[place] PHYSICAL MEDICINE & REHABILITATION     PROGRESS NOTE    Subjective/Complaints:  Small hard stool , no abd pain.    ROS: Denies CP, SOB, N/V/D.no bladder issues  Objective: Vital Signs: Blood pressure 148/66, pulse 84, temperature 98.9 F (37.2 C), temperature source Oral, resp. rate 18, SpO2 98 %. No results found. No results for input(s): WBC, HGB, HCT, PLT in the last 72 hours. No results for input(s): NA, K, CL, GLUCOSE, BUN, CREATININE, CALCIUM in the last 72 hours.  Invalid input(s): CO CBG (last 3)   Recent Labs  12/19/15 1628 12/19/15 2111 12/20/15 0644  GLUCAP 106* 103* 126*    Wt Readings from Last 3 Encounters:  12/13/15 92.987 kg (205 lb)  12/12/15 92.987 kg (205 lb)  12/11/15 94.348 kg (208 lb)    Physical Exam:  BP 148/66 mmHg  Pulse 84  Temp(Src) 98.9 F (37.2 C) (Oral)  Resp 18  SpO2 98% Constitutional: He appears well-developed and well-nourished. NAD. Vital signs reviewed.  Appears younger than stated age.  HENT: Normocephalic and atraumatic.  Mouth/Throat: No oropharyngeal exudate.  Eyes: Conjunctivae and EOM are normal. No scleral icterus.  Cardiovascular: Normal rate and regular rhythm. Holosystolic murmur.  Respiratory: Effort normal and breath sounds normal. No stridor. He exhibits no tenderness.  GI: Soft. Bowel sounds are normal. He exhibits no distension. There is no tenderness.  Musculoskeletal: He exhibits edema (right ankle with trace edema). He exhibits no tenderness.  Neurological: He is alert and oriented to person, place, and time.  Right facial weakness with minimal dysarthria.  Able to follow one and two step motor commands without difficulty.  Sensation diminished to light touch RUE/RLE (improving) Motor: LUE/LLE: 5/5 RUE: shoulder abduction 4/5, elbow flexion 4/5, elbow extension 3+/5, hand grip 3+/5 RLE: hip flexion 2+/5, ankle dorsi/plantar flexion 2/5  Skin: Skin is warm and dry. No rash noted.   Psychiatric: He has a normal mood and affect. His behavior is normal. Judgment and thought content normal.    Assessment/Plan: 1. 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 Physiatrist is providing close team supervision and 24 hour management of active medical problems listed below. Physiatrist and rehab team continue to assess barriers to discharge/monitor patient progress toward functional and medical goals.  Function:  Bathing Bathing position   Position: Shower  Bathing parts Body parts bathed by patient: Right arm, Right upper leg, Left arm, Left upper leg, Chest, Abdomen, Front perineal area, Left lower leg, Buttocks, Back, Right lower leg Body parts bathed by helper: Right lower leg  Bathing assist Assist Level: Supervision or verbal cues      Upper Body Dressing/Undressing Upper body dressing   What is the patient wearing?: Pull over shirt/dress     Pull over shirt/dress - Perfomed by patient: Thread/unthread right sleeve, Thread/unthread left sleeve, Put head through opening, Pull shirt over trunk          Upper body assist Assist Level: Set up   Set up : To obtain clothing/put away  Lower Body Dressing/Undressing Lower body dressing   What is the patient wearing?: Underwear, Pants, [employer], Shoes Underwear - Performed by patient: Thread/unthread right underwear leg, Thread/unthread left underwear leg, Pull underwear up/down   Pants- Performed by patient: Thread/unthread right pants leg, Thread/unthread left pants leg, Pull pants up/down Pants- Performed by helper: Thread/unthread right pants leg Non-skid slipper socks- Performed by patient:  Don/doff right sock, Don/doff left sock   Socks - Performed by patient: Don/doff right sock, Don/doff left sock   Shoes - Performed by patient: Don/doff left shoe, Fasten right, Fasten left Shoes -  Performed by helper: Don/doff right shoe       TED Hose - Performed by helper: Don/doff right TED hose, Don/doff left TED hose  Lower body assist Assist for lower body dressing: Touching or steadying assistance (Pt > 75%)      Toileting Toileting   Toileting steps completed by patient: Adjust clothing prior to toileting, Performs perineal hygiene, Adjust clothing after toileting   Toileting Assistive Devices: Grab bar or rail  Toileting assist Assist level: Touching or steadying assistance (Pt.75%)   Transfers Chair/bed transfer   Chair/bed transfer method: Stand pivot Chair/bed transfer assist level: Moderate assist (Pt 50 - 74%/lift or lower) Chair/bed transfer assistive device: Patent attorney     Max distance: 70 Assist level: Touching or steadying assistance (Pt > 75%)   Wheelchair   Type: Manual Max wheelchair distance: 160 Assist Level: Touching or steadying assistance (Pt > 75%)  Cognition Comprehension Comprehension assist level: Follows basic conversation/direction with extra time/assistive device  Expression Expression assist level: Expresses basic 90% of the time/requires cueing < 10% of the time.  Social Interaction Social Interaction assist level: Interacts appropriately with others - No medications needed.  Problem Solving Problem solving assist level: Solves basic 90% of the time/requires cueing < 10% of the time  Memory Memory assist level: Recognizes or recalls 90% of the time/requires cueing < 10% of the time    Medical Problem List and Plan: 1. 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-Team conference today please see physician documentation under team conference tab, met with team face-to-face to discuss problems,progress, and goals. Formulized individual treatment plan based on medical history, underlying problem and comorbidities. Cont to modify secondary risk factors 2. DVT  Prophylaxis/Anticoagulation: Mechanical: Sequential compression devices, below knee Bilateral lower extremities, Pharmaceutical: Lovenox 3. Pain Management: Tylenol and Norco PRN.  4. Mood: LCSW to follow for evaluation and support 5. Neuropsych: This patient is capable of making decisions on his own behalf. 6. Skin/Wound Care: Maintain adequate nutrition and hydration status. Routine pressure relief measures.  7. Fluids/Electrolytes/Nutrition: Monitor I/O. Offer supplements if intake poor.              Protein supplement added  8. HTN Cont-toprol, Imdur , Lasix Monitor VS during therapies and at rest and adjust as indicated to ensure goal BP maintained  9. DM HbA1c 6.8 on 12/22-good control FSBS trend:  CBG (last 3)   Recent Labs  12/19/15 1628 12/19/15 2111 12/20/15 0644  GLUCAP 106* 103* 126*   Follow FSBS while in rehab and adjust regimen as indicated goal FSBS 70-160 Diabetic Diet  10. CAD s/p recent CABG-on Imdur Monitor in accordance with increased physical activity and avoid UE resistance excercises 11. OSA Cont O2 at night Evaluate signs of daytime somnolence. 12. AS See CAD 13. Anemia Hb 8.4 on 12/24  Will periodically monitor Hb to ensure adequate perfusion and energy for therapies Will transfuse if pt becomes symptomatic and/or Hb <7.0 14. Spastic bladder Cont Ditropan-monitor retention15. GERD Cont Protonix 16. Hypokalemia: Normalized after supplementation Cont K+ supplement 3.9 on 12/24 Will monitor periodically 17. COPD Cont Spiriva- currently asymptomatic 18.  Constipation-will adjust laxatives       LOS (Days) 5 A FACE TO FACE EVALUATION WAS PERFORMED  Erick Colace 12/20/2015  7:50 AM

## 2015-12-20 NOTE — Progress Notes (Signed)
Occupational Therapy Session Note  Patient Details  Name: Douglas Carrillo MRN: [id number] Date of Birth: [birth year]  Today's Date: 12/20/2015 OT Individual Time: 1345-1430 OT Individual Time Calculation (min): 45 min    Short Term Goals: Week 1:  OT Short Term Goal 1 (Week 1): Pt will complete toilet transfer (BSC over toilet) with min A OT Short Term Goal 2 (Week 1): Pt will maintain dynamic standing balance during LB dressing task with min A OT Short Term Goal 3 (Week 1): Pt will complete 2 groomings tasks in standing with supervision to increase functional activity tolerance OT Short Term Goal 4 (Week 1): Pt will use R UE at stabilizer level mod I  Skilled Therapeutic Interventions/Progress Updates:    Pt engaged in BADL retraining including bathing at shower level and dressing with sit<>stand from recliner. Pt amb with RW (steady A) to bathroom for transfer to tub transfer bench.  Pt returned to recliner to complete dressing tasks. Pt used RUE WFL for all tasks. Pt amb with RW to ADL apartment to practice bed mobility.  Pt noted with increase difficulty with RLE clearance near end of ambulation.  Pt stated his knee was getting weak.  Pt amb in ADL bedroom for accessing bed and practicing bed mobility.  Pt propelled w/c back to room and amb with RW into room to recliner.  Focus on activity tolerance, sit<>stand, standing balance, functional amb with RW, increased use of RUE, and safety awareness to increase independence with BADLs.  Therapy Documentation Precautions:  Precautions Precautions: Fall Restrictions Weight Bearing Restrictions: No   Pain:  Pt denied pain  See Function Navigator for Current Functional Status.   Therapy/Group: Individual Therapy  Leroy Libman 12/20/2015, 2:43 PM

## 2015-12-20 NOTE — Progress Notes (Signed)
Occupational Therapy Session Note  Patient Details  Name: Douglas Carrillo MRN: [id number] Date of Birth: 04-12-1928  Today's Date: 12/20/2015 OT Individual Time: 1535-1605 OT Individual Time Calculation (min): 30 min    Short Term Goals: Week 1:  OT Short Term Goal 1 (Week 1): Pt will complete toilet transfer (BSC over toilet) with min A OT Short Term Goal 2 (Week 1): Pt will maintain dynamic standing balance during LB dressing task with min A OT Short Term Goal 3 (Week 1): Pt will complete 2 groomings tasks in standing with supervision to increase functional activity tolerance OT Short Term Goal 4 (Week 1): Pt will use R UE at stabilizer level mod I  Skilled Therapeutic Interventions/Progress Updates:    Treatment session with focus on functional use of dominant RUE and dynamic standing balance.  Engaged in standing activity with focus on trunk control with reaching across midline to obtain items off mat next to pt and then stack on table, then when cleaning up activity placing items on mat on opposite side of pt to facilitate weight shifting and weight bearing through RLE.  Engaged in [facility] task in standing with focus on obtaining small items from large container, pt with difficulty selecting appropriate pieces in container of multiple objects.  Pt continues to demonstrate increased fine and gross motor control with RUE.  Pt's daughter present and observed session.  Therapy Documentation Precautions:  Precautions Precautions: Fall Restrictions Weight Bearing Restrictions: No General:   Vital Signs: Therapy Vitals Temp: 97.8 F (36.6 C) Temp Source: Oral Pulse Rate: 81 Resp: 18 BP: 132/65 mmHg Patient Position (if appropriate): Sitting Oxygen Therapy SpO2: 95 % O2 Device: Not Delivered Pain:  Pt with no c/o pain  See Function Navigator for Current Functional Status.   Therapy/Group: Individual Therapy  [name] 12/20/2015, 4:17 PM

## 2015-12-20 NOTE — Patient Care Conference (Signed)
Inpatient RehabilitationTeam Conference and Plan of Care Update Date: 12/20/2015   Time: 11:00 AM    Patient Name: Douglas Carrillo      Medical Record Number: [id number]  Date of Birth: 03-17-1928 Sex: Male         Room/Bed: 4M09C/4M09C-01 Payor Info: Payor: MEDICARE / Plan: MEDICARE PART A AND B / Product Type: *No Product type* /    Admitting Diagnosis: L CVA  Admit Date/Time:  12/15/2015  5:44 PM Admission Comments: No comment available   Primary Diagnosis:  <principal problem not specified> Principal Problem: <principal problem not specified>  Patient Active Problem List   Diagnosis Date Noted  . Stroke, acute, thrombotic ([place]) 12/15/2015  . Hemiparesis affecting dominant side as late effect of stroke ([place]) 12/15/2015  . Anemia 12/15/2015  . Spastic neurogenic bladder 12/15/2015  . GERD (gastroesophageal reflux disease) 12/15/2015  . Hypokalemia 12/15/2015  . Cerebral infarction due to unspecified mechanism   . CVA (cerebral infarction) 12/13/2015  . Prolonged Q-T interval on ECG 12/13/2015  . Right sided weakness 12/13/2015  . Elevated troponin 12/13/2015  . Acute kidney failure ([place]) 12/13/2015  . Normocytic anemia 12/13/2015  . Diabetes mellitus with complication ([place])   . CAD in native artery   . Aortic stenosis   . OSA (obstructive sleep apnea) 02/08/2015  . COPD, moderate ([place]) 01/12/2015  . COPD (chronic obstructive pulmonary disease) ([place]) 11/10/2014  . Nocturnal hypoxia 11/10/2014  . Dyspnea and respiratory abnormality 10/19/2014  . Aortic valve stenosis 05/27/2014  . CAD (coronary artery disease) of artery bypass graft 11/30/2013  . CAD (coronary artery disease) 11/30/2013  . Hyperlipidemia with target LDL less than 70 11/30/2013  . Essential hypertension 11/30/2013  . DM2 (diabetes mellitus, type 2) ([place]) 11/30/2013  . RBBB 11/30/2013    Expected Discharge Date: Expected Discharge Date: 12/29/15  Team Members Present: Physician leading conference: Dr.  Alysia Penna Social Worker Present: Ovidio Kin, LCSW Nurse Present: Dorien Chihuahua, RN PT Present: Jorge Mandril, PT OT Present: [name], OT SLP Present: Windell Moulding, SLP PPS Coordinator present : Daiva Nakayama, RN, CRRN     Current Status/Progress Goal Weekly Team Focus  Medical   cont of bladder, constipated  continenet and regular bowel and bladder  laxative adjustment   Bowel/Bladder   Continent of bowel and bladder. LBM 12/18/15  Pt to remain continent of bowel and bladder  Monitor   Swallow/Nutrition/ Hydration     na        ADL's   supervision bathing, min assist LB dressing, min assist transfers  Supervision/Mod I overall  RUE NMR, self-care retraining, dynamic standing balance    Mobility   min A gait and transfers  supervision  R LE strength, coordination, balance, gait   Communication     na        Safety/Cognition/ Behavioral Observations    no unsafe behaviors        Pain   Tylenol 650mg  q 4hrs prn for back discomfort  <3  Monitor for effectiveness   Skin   CDI  CDI  Assess q shift      *See Care Plan and progress notes for long and short-term goals.  Barriers to Discharge: loses balance to the right with reduced endurance    Possible Resolutions to Barriers:  will need to train to improve endurance    Discharge Planning/Teaching Needs:  Daughter to stay with pt when first goes home and then go from there. She has been here and attended  therapies with dad      Team Discussion:  Goals supervision/mod/i level.  Loses balance to the right-pt aware. Fatigues easily. When first goes home 24 hr supervision recommended. Daughter has been here to observe in therapies. Medically stable  Revisions to Treatment Plan:  None   Continued Need for Acute Rehabilitation Level of Care: The patient requires daily medical management by a physician with specialized training in physical medicine and rehabilitation for the following conditions: Daily direction of a  multidisciplinary physical rehabilitation program to ensure safe treatment while eliciting the highest outcome that is of practical value to the patient.: Yes Daily medical management of patient stability for increased activity during participation in an intensive rehabilitation regime.: Yes Daily analysis of laboratory values and/or radiology reports with any subsequent need for medication adjustment of medical intervention for : Neurological problems;Other  Elease Hashimoto 12/20/2015, 12:33 PM

## 2015-12-20 NOTE — Progress Notes (Signed)
Physical Therapy Session Note  Patient Details  Name: Douglas Carrillo MRN: [id number] Date of Birth: 11/07/1928  Today's Date: 12/20/2015 PT Individual Time: 0920-1020 PT Individual Time Calculation (min): 60 min   Short Term Goals: Week 1:  PT Short Term Goal 1 (Week 1): Pt will perform bed mobility mod I from flat bed PT Short Term Goal 2 (Week 1): Pt will perform stand pivot transfer consistent S PT Short Term Goal 3 (Week 1): Pt will perform gait with LRAD x100' with min guard and LRAD PT Short Term Goal 4 (Week 1): Pt will perform ascent/descent of 4 6-inch stairs with minA   Therapy Documentation Precautions:  Precautions Precautions: Fall Restrictions Weight Bearing Restrictions: No     Stand pivot transfer from recliner to wheelchair min assist  Sit to and from stand transfer close supervision with occasional min assist for LOB posteriorly.  Patient ambulated 75 feetx2 with RW min assist. Patient ambulated with a step through gait pattern. Patient with noted improvement with RLE foot clearance and advancement. Verbal cues for upright posture and RW management.  Patient up and down 12 step with left handrail with min assist and step to pattern. Patient educated on proper sequence and technique. Min verbal cues throughoutr for handplacement on rail and correct sequence for foot placement.   Short sit to and from supine on mat supervision  Supine there ex: Bridging 3x10 with ball squeeze manual facilitation provided for even weight distribution. Bilateral hip flexion and abduction 3x10  Nu-Step 10 minutes workload level 4  Car transfer with RW min assist to return to standing from low surface.   Patient tolerated treatment well. Vitals monitored and remained stable throughout session responding appropriately to activity. Patient was without pain during session. Patient tolerated session with rest breaks throughout. Patient returned to room at end of session with all  needs met resting comfortably in chair at bedside.  Call bell within reach and patient educated not to be up without assistance. Patient verbalized understanding.   See Function Navigator for Current Functional Status.   Therapy/Group: Individual Therapy  [name] 12/20/2015, 9:52 AM

## 2015-12-20 NOTE — Progress Notes (Signed)
Occupational Therapy Session Note  Patient Details  Name: Douglas Carrillo MRN: [id number] Date of Birth: 1928-03-19  Today's Date: 12/20/2015 OT Individual Time: [id number] Individual Time Calculation (min): 60 min    Short Term Goals: Week 1:  OT Short Term Goal 1 (Week 1): Pt will complete toilet transfer (BSC over toilet) with min A OT Short Term Goal 2 (Week 1): Pt will maintain dynamic standing balance during LB dressing task with min A OT Short Term Goal 3 (Week 1): Pt will complete 2 groomings tasks in standing with supervision to increase functional activity tolerance OT Short Term Goal 4 (Week 1): Pt will use R UE at stabilizer level mod I  Skilled Therapeutic Interventions/Progress Updates:    Treatment session with focus on dynamic standing balance, trunk control, and functional use of dominant RUE.  Pt ambulated to sink with RW and min assist, completed grooming tasks in standing with use of Rt hand and steady assist to supervision for standing balance with UE support.  In therapy gym, engaged in dynamic standing and reaching task with focus on weight shifting over RLE with reaching outside BOS to [place] with RUE.  Pt continues to demonstrate lean to Rt with standing activity, able to identify but requiring min assist to correct.  Utilized 2" step under LLE to promote weight shifting and bearing through RLE with reaching outside BOS.  Ambulated 25' with RW and min/steady assist with improved weight shift and step length.  Therapy Documentation Precautions:  Precautions Precautions: Fall Restrictions Weight Bearing Restrictions: No General:   Vital Signs: Therapy Vitals Temp: 98.9 F (37.2 C) Temp Source: Oral Pulse Rate: 84 Resp: 18 BP: (!) 148/66 mmHg Patient Position (if appropriate): Lying Oxygen Therapy SpO2: 98 % O2 Device: Nasal Cannula O2 Flow Rate (L/min): 2 L/min Pain:   Pt with no c/o pain  See Function Navigator for Current Functional  Status.   Therapy/Group: Individual Therapy  [name] 12/20/2015, 8:50 AM

## 2015-12-21 ENCOUNTER — Inpatient Hospital Stay (HOSPITAL_COMMUNITY): Payer: Medicare Other | Admitting: Occupational Therapy

## 2015-12-21 ENCOUNTER — Inpatient Hospital Stay (HOSPITAL_COMMUNITY): Payer: Medicare Other | Admitting: Physical Therapy

## 2015-12-21 LAB — GLUCOSE, CAPILLARY
GLUCOSE-CAPILLARY: 88 mg/dL (ref 65–99)
Glucose-Capillary: 114 mg/dL — ABNORMAL HIGH (ref 65–99)
Glucose-Capillary: 136 mg/dL — ABNORMAL HIGH (ref 65–99)

## 2015-12-21 NOTE — Progress Notes (Signed)
Occupational Therapy Session Note  Patient Details  Name: Douglas Carrillo MRN: [id number] Date of Birth: Nov 28, 1928  Today's Date: 12/21/2015 OT Individual Time: 0830-1000 and 1420-1500 OT Individual Time Calculation (min): 90 min and 40 min   Short Term Goals: Week 1:  OT Short Term Goal 1 (Week 1): Pt will complete toilet transfer (BSC over toilet) with min A OT Short Term Goal 2 (Week 1): Pt will maintain dynamic standing balance during LB dressing task with min A OT Short Term Goal 3 (Week 1): Pt will complete 2 groomings tasks in standing with supervision to increase functional activity tolerance OT Short Term Goal 4 (Week 1): Pt will use R UE at stabilizer level mod I  Skilled Therapeutic Interventions/Progress Updates:    1) Treatment session with focus on functional mobility, bathroom transfers, functional use of RUE, and dynamic standing balance.  Pt ambulated to room shower with RW and min assist, especially when completing transfer to shower.  Supervision with bathing at sit > stand level from shower seat.  Pt with 1 LOB when standing to pull underwear over hips, requiring min assist to correct.  LB dressing requiring increased time with pt utilizing shoe horn when donning Rt shoe.  Grooming completed in standing with use of RUE for oral care and brushing hair.  Educated pt on tub/shower transfers with use of tub bench with pt return demonstrating transfer with supervision - min assist for stability when ambulating into bathroom.  Focus on RUE gross motor control with tossing and bouncing tennis ball with Rt hand only, then to fine motor control with connecting medium sized puzzle pieces.  2) Treatment session with focus on RUE/RLE NMR with weight bearing in tall kneeling, weight shifting in standing, and alternating toe taps.  Engaged in tall kneeling on therapy mat while completing 3D pipe tree weight bearing through RUE while reaching with LUE across midline and completing puzzle  primarily with Lt hand and then taking apart pipe tree with Rt hand all while challenging weight shifting through RLE and trunk control.  Alternating toe taps in standing with UE support on RW with pt able to maintain weight bearing through RLE while tapping 3 sets of 20 with Lt and then able to demonstrate control to complete 8 taps with Rt while providing min assist for balance.  Completed 3D pipe tree in standing with focus on trunk control and weight shifting while reaching across midline to obtain items.  Pt's granddaughter present and observing session.  Therapy Documentation Precautions:  Precautions Precautions: Fall Restrictions Weight Bearing Restrictions: No Pain:  Pt with no c/o pain  See Function Navigator for Current Functional Status.   Therapy/Group: Individual Therapy  [name] 12/21/2015, 12:06 PM

## 2015-12-21 NOTE — Progress Notes (Signed)
Physical Therapy Session Note  Patient Details  Name: Douglas Carrillo MRN: [id number] Date of Birth: [birth year]  Today's Date: 12/21/2015 PT Concurrent Time: 1315-1345 PT Concurrent Time Calculation (min): 30 min  Short Term Goals: Week 1:  PT Short Term Goal 1 (Week 1): Pt will perform bed mobility mod I from flat bed PT Short Term Goal 2 (Week 1): Pt will perform stand pivot transfer consistent S PT Short Term Goal 3 (Week 1): Pt will perform gait with LRAD x100' with min guard and LRAD PT Short Term Goal 4 (Week 1): Pt will perform ascent/descent of 4 6-inch stairs with minA      Therapy Documentation Precautions:  Precautions Precautions: Fall Restrictions Weight Bearing Restrictions: No  Stand pivot transfer from recliner to wheelchair min assist  Sit to and from stand transfer close supervision.  Patient ambulated with min assist through obstacle course around cones and stepping over obstacles with RW 75 feetx2.  Seated ther e x: B Marching 3x10 with 3 pound cuff weight  Patient up and down 12 step with left handrail with min assist and step to pattern. Patient required mod assist while descending stairs secondary to improper sequence. Patient educated on proper sequence and technique. Min verbal cues throughoutr for handplacement on rail and correct sequence for foot placement.    Patient stood two trials without assistive device min assist to mod assist performing reaching and matching tasks with playing cards with moderate to maximum excursions from midline with a narrow base of support. Patient matched 9 cards on each trial. Patient dropped one card and was able to retrieve card from floor with min assist.    Patient tolerated treatment well. Patient was without complaints of pain. Patient tolerated session with rest breaks throughout. Patient handed off directly to OT Amy for further treatment.  See Function Navigator for Current Functional  Status.   Therapy/Group: Concurrent  [name] 12/21/2015, 2:11 PM

## 2015-12-21 NOTE — Progress Notes (Signed)
Occupational Therapy Session Note  Patient Details  Name: Douglas Carrillo MRN: [id number] Date of Birth: 02-Sep-1928  Today's Date: 12/21/2015 OT Individual Time: 1345-1415 OT Individual Time Calculation (min): 30 min    Short Term Goals: Week 1:  OT Short Term Goal 1 (Week 1): Pt will complete toilet transfer (BSC over toilet) with min A OT Short Term Goal 2 (Week 1): Pt will maintain dynamic standing balance during LB dressing task with min A OT Short Term Goal 3 (Week 1): Pt will complete 2 groomings tasks in standing with supervision to increase functional activity tolerance OT Short Term Goal 4 (Week 1): Pt will use R UE at stabilizer level mod I  Skilled Therapeutic Interventions/Progress Updates:    Pt seen for OT session focusing on functional mobility and IADL re-training. Pt received in gym with hand off from PT, agreeable to tx session. He propelled w/c to ADL apartment using B UEs and LEs with VCs for turning technique in w/c. Pt ambulated in apartment with min A using RW.  He retrieved items from overhead cabinet using R UE for strengthening exercises and as part of leaning to manage RW in functional task. He then retrieved items from refrigerator and placed/ took out items from oven. VCs provided for RW placement and how to approach kitchen appliances for safest option. Seated rest breaks required btwn tasks with pt tolerated ~3-4 minutes of functional ambulation before requiring seated rest breaks. He completed sit <> stand from low soft surface with supervision and VCs for technique. Pt ambulated back to gym and educated regarding calf stretches in sitting position as pt voiced tightness in B LEs. Pt left sitting on EOM at end of session in prep for hand off to next discipline.  Pt educated throughout session regarding energy conservation and safety awareness.   Therapy Documentation Precautions:  Precautions Precautions: Fall Restrictions Weight Bearing Restrictions:  No Pain:   No/ denies pain  See Function Navigator for Current Functional Status.   Therapy/Group: Individual Therapy  Lewis, Kani Jobson C 12/21/2015, 3:34 PM

## 2015-12-21 NOTE — Progress Notes (Signed)
[facility]     PROGRESS NOTE    Subjective/Complaints:  No pain c/os Happy about good BM this am  ROS: Denies CP, SOB, N/V/D.no bladder issues  Objective: Vital Signs: Blood pressure 137/71, pulse 88, temperature 98.3 F (36.8 C), temperature source Oral, resp. rate 18, weight 91.536 kg (201 lb 12.8 oz), SpO2 93 %. No results found. No results for input(s): WBC, HGB, HCT, PLT in the last 72 hours. No results for input(s): NA, K, CL, GLUCOSE, BUN, CREATININE, CALCIUM in the last 72 hours.  Invalid input(s): CO CBG (last 3)   Recent Labs  12/20/15 1638 12/20/15 2109 12/21/15 0648  GLUCAP 123* 136* 114*    Wt Readings from Last 3 Encounters:  12/21/15 91.536 kg (201 lb 12.8 oz)  12/13/15 92.987 kg (205 lb)  12/12/15 92.987 kg (205 lb)    Physical Exam:  BP 137/71 mmHg  Pulse 88  Temp(Src) 98.3 F (36.8 C) (Oral)  Resp 18  Wt 91.536 kg (201 lb 12.8 oz)  SpO2 93% Constitutional: He appears well-developed and well-nourished. NAD. Vital signs reviewed.  Appears younger than stated age.  HENT: Normocephalic and atraumatic.  Mouth/Throat: No oropharyngeal exudate.  Eyes: Conjunctivae and EOM are normal. No scleral icterus.  Cardiovascular: Normal rate and regular rhythm. Holosystolic murmur.  Respiratory: Effort normal and breath sounds normal. No stridor. He exhibits no tenderness.  GI: Soft. Bowel sounds are normal. He exhibits no distension. There is no tenderness.  Musculoskeletal: He exhibits edema (right ankle with trace edema). He exhibits no tenderness.  Neurological: He is alert and oriented to person, place, and time.  Right facial weakness with minimal dysarthria.  Able to follow one and two step motor commands without difficulty.  Sensation diminished to light touch RUE/RLE (improving) Motor: LUE/LLE: 5/5 RUE: shoulder abduction 4/5, elbow flexion 4/5, elbow extension 3+/5, hand grip 3+/5 RLE: hip flexion  2+/5, ankle dorsi/plantar flexion 2/5  Skin: Skin is warm and dry. No rash noted.  Psychiatric: He has a normal mood and affect. His behavior is normal. Judgment and thought content normal.    Assessment/Plan: 1. 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 Physiatrist is providing close team supervision and 24 hour management of active medical problems listed below. Physiatrist and rehab team continue to assess barriers to discharge/monitor patient progress toward functional and medical goals.  Function:  Bathing Bathing position   Position: Shower  Bathing parts Body parts bathed by patient: Right arm, Right upper leg, Left arm, Left upper leg, Chest, Abdomen, Front perineal area, Left lower leg, Buttocks, Back, Right lower leg Body parts bathed by helper: Right lower leg  Bathing assist Assist Level: Supervision or verbal cues      Upper Body Dressing/Undressing Upper body dressing   What is the patient wearing?: Pull over shirt/dress     Pull over shirt/dress - Perfomed by patient: Thread/unthread right sleeve, Thread/unthread left sleeve, Put head through opening, Pull shirt over trunk          Upper body assist Assist Level: Set up   Set up : To obtain clothing/put away  Lower Body Dressing/Undressing Lower body dressing   What is the patient wearing?: Underwear, Pants, Non-skid slipper socks Underwear - Performed by patient: Thread/unthread right underwear leg, Thread/unthread left underwear leg, Pull underwear up/down   Pants- Performed by patient: Thread/unthread right pants leg, Thread/unthread left pants leg,  Pull pants up/down Pants- Performed by helper: Thread/unthread right pants leg Non-skid slipper socks- Performed by patient: Don/doff right sock, Don/doff left sock   Socks - Performed by patient: Don/doff right sock, Don/doff left sock    Shoes - Performed by patient: Don/doff left shoe, Fasten right, Fasten left Shoes - Performed by helper: Don/doff right shoe       TED Hose - Performed by helper: Don/doff right TED hose, Don/doff left TED hose  Lower body assist Assist for lower body dressing: Supervision or verbal cues      Toileting Toileting   Toileting steps completed by patient: Adjust clothing prior to toileting, Performs perineal hygiene, Adjust clothing after toileting   Toileting Assistive Devices: Grab bar or rail  Toileting assist Assist level: Touching or steadying assistance (Pt.75%)   Transfers Chair/bed transfer   Chair/bed transfer method: Stand pivot Chair/bed transfer assist level: Moderate assist (Pt 50 - 74%/lift or lower) Chair/bed transfer assistive device: Armrests     Locomotion Ambulation     Max distance: 75 Assist level: Touching or steadying assistance (Pt > 75%)   Wheelchair   Type: Manual Max wheelchair distance: 175 Assist Level: Supervision or verbal cues  Cognition Comprehension Comprehension assist level: Follows basic conversation/direction with extra time/assistive device  Expression Expression assist level: Expresses basic 90% of the time/requires cueing < 10% of the time.  Social Interaction Social Interaction assist level: Interacts appropriately with others - No medications needed.  Problem Solving Problem solving assist level: Solves basic 90% of the time/requires cueing < 10% of the time  Memory Memory assist level: Recognizes or recalls 90% of the time/requires cueing < 10% of the time    Medical Problem List and Plan: 1. 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 DVT Prophylaxis/Anticoagulation: Mechanical: Sequential compression devices, below knee Bilateral lower extremities, Pharmaceutical: Lovenox 3. Pain Management: Tylenol and Norco PRN.  4. Mood: LCSW to follow for evaluation  and support 5. Neuropsych: This patient is capable of making decisions on his own behalf. 6. Skin/Wound Care: Maintain adequate nutrition and hydration status. Routine pressure relief measures.  7. Fluids/Electrolytes/Nutrition: Monitor I/O. Offer supplements if intake poor.              Protein supplement added  8. HTN Cont-toprol, Imdur , Lasix, BP in desired range Monitor VS during therapies and at rest and adjust as indicated to ensure goal BP maintained  9. DM HbA1c 6.8 on 12/22-good control FSBS trend:  CBG (last 3)   Recent Labs  12/20/15 1638 12/20/15 2109 12/21/15 0648  GLUCAP 123* 136* 114*   no med adjustment  10. CAD s/p recent CABG-on Imdur Monitor in accordance with increased physical activity and avoid UE resistance excercises 11. OSA Cont O2 at night Evaluate signs of daytime somnolence. 12. AS See CAD 13. Anemia Hb 8.4 on 12/24  Will periodically monitor Hb to ensure adequate perfusion and energy for therapies Will transfuse if pt becomes symptomatic and/or Hb <7.0 14. Spastic bladder Cont Ditropan-PVRs normal 15. GERD Cont Protonix 16. Hypokalemia: Normalized after supplementation Cont K+ supplement 3.9 on 12/24 Will monitor periodically 17. COPD Cont Spiriva- currently asymptomatic 18.  Constipation-formed BM today       LOS (Days) 6 A FACE TO FACE EVALUATION WAS PERFORMED  Charlett Blake 12/21/2015 7:51 AM

## 2015-12-21 NOTE — Progress Notes (Signed)
Social Work Patient ID: Douglas Carrillo, male   DOB: October 25, 1928, 81 y.o.   MRN: [id number] Gave ramp information to pt and granddaughter who will give it to her Mom to pursue.

## 2015-12-22 ENCOUNTER — Inpatient Hospital Stay (HOSPITAL_COMMUNITY): Payer: Medicare Other | Admitting: Physical Therapy

## 2015-12-22 ENCOUNTER — Inpatient Hospital Stay (HOSPITAL_COMMUNITY): Payer: Medicare Other | Admitting: Occupational Therapy

## 2015-12-22 LAB — BASIC METABOLIC PANEL
Anion gap: 11 (ref 5–15)
BUN: 14 mg/dL (ref 6–20)
CALCIUM: 9.5 mg/dL (ref 8.9–10.3)
CHLORIDE: 107 mmol/L (ref 101–111)
CO2: 21 mmol/L — AB (ref 22–32)
CREATININE: 1.31 mg/dL — AB (ref 0.61–1.24)
GFR calc non Af Amer: 48 mL/min — ABNORMAL LOW (ref >60)
GFR, EST AFRICAN AMERICAN: 56 mL/min — AB (ref >60)
Glucose, Bld: 117 mg/dL — ABNORMAL HIGH (ref 65–99)
Potassium: 3.7 mmol/L (ref 3.5–5.1)
SODIUM: 139 mmol/L (ref 135–145)

## 2015-12-22 LAB — CREATININE, SERUM
CREATININE: 1.35 mg/dL — AB (ref 0.61–1.24)
GFR calc non Af Amer: 46 mL/min — ABNORMAL LOW (ref >60)
GFR, EST AFRICAN AMERICAN: 54 mL/min — AB (ref >60)

## 2015-12-22 LAB — GLUCOSE, CAPILLARY
GLUCOSE-CAPILLARY: 107 mg/dL — AB (ref 65–99)
GLUCOSE-CAPILLARY: 132 mg/dL — AB (ref 65–99)
Glucose-Capillary: 111 mg/dL — ABNORMAL HIGH (ref 65–99)
Glucose-Capillary: 125 mg/dL — ABNORMAL HIGH (ref 65–99)
Glucose-Capillary: 101 mg/dL — ABNORMAL HIGH (ref 65–99)

## 2015-12-22 MED ORDER — POTASSIUM CHLORIDE CRYS ER 20 MEQ PO TBCR
40.0000 meq | EXTENDED_RELEASE_TABLET | Freq: Every day | ORAL | Status: DC
  Administered 2015-12-22 – 2015-12-29 (×8): 40 meq via ORAL
  Filled 2015-12-22 (×8): qty 2

## 2015-12-22 MED ORDER — FUROSEMIDE 20 MG PO TABS
20.0000 mg | ORAL_TABLET | Freq: Every day | ORAL | Status: DC
  Administered 2015-12-22 – 2015-12-27 (×6): 20 mg via ORAL
  Filled 2015-12-22 (×6): qty 1

## 2015-12-22 MED ORDER — SENNOSIDES-DOCUSATE SODIUM 8.6-50 MG PO TABS
1.0000 | ORAL_TABLET | Freq: Two times a day (BID) | ORAL | Status: DC
  Filled 2015-12-22 (×6): qty 1

## 2015-12-22 NOTE — Progress Notes (Signed)
Physical Therapy Weekly Progress Note  Patient Details  Name: Douglas Carrillo MRN: [id number] Date of Birth: [birth year]  Beginning of progress report period: December 16, 2015 End of progress report period: December 22, 2015  Today's Date: 12/22/2015 PT Individual Time: 1500-1530 and 0800-0915 PT Individual Time Calculation (min): 30 min and 75 min (total 105 min)   Patient has met 3 of 4 short term goals.  Bed mobility continues to require distant S and occasional cues for safety. Pt is ambulating with RW >150' with min guard to close S, with use of cues and facilitation as necessary for weight bearing/shifting on R side, upright posture. Transfers with stand pivot consistent S with occasional verbal cues for locking w/c, w/c setup and hand placement. Performing stairs minA. Pt continues to require intervention to address dynamic standing balance, gait training and safety to prepare pt for d/c home at mod I level.   Patient continues to demonstrate the following deficits: activity tolerance, balance, postural control, ability to compensate for deficits, functional use of  right upper extremity and right lower extremity, attention, awareness and coordination and therefore will continue to benefit from skilled PT intervention to enhance overall performance with bed mobility, transfers, gait, stairs, home and community access.  Patient progressing toward long term goals..  Continue plan of care.  PT Short Term Goals Week 1:  PT Short Term Goal 1 (Week 1): Pt will perform bed mobility mod I from flat bed PT Short Term Goal 1 - Progress (Week 1): Not met PT Short Term Goal 2 (Week 1): Pt will perform stand pivot transfer consistent S PT Short Term Goal 2 - Progress (Week 1): Met PT Short Term Goal 3 (Week 1): Pt will perform gait with LRAD x100' with min guard and LRAD PT Short Term Goal 3 - Progress (Week 1): Met PT Short Term Goal 4 (Week 1): Pt will perform ascent/descent of 4 6-inch stairs  with minA PT Short Term Goal 4 - Progress (Week 1): Met Week 2:  PT Short Term Goal 1 (Week 2): = LTG due to estimated length of stay  Skilled Therapeutic Interventions/Progress Updates:    Tx 1: Tx 1: Pt received seated in recliner with c/o "some back soreness" from his fall before hospitalization, however does not rate. Performed lower body dressing with set-up A to retrieve clothes,and close S with RW for standing balance to pull up pants. Standing at sink performed teeth brushing, min verbal cues for symmetrical foot placement and even weight shift to increase RLE weight bearing and stance control. Gait with RW x230' to therapy gym with min guard>minA, facilitation at R glutes for terminal hip extension in stance, and verbal cues for trunk extension and upright posture. Standing balance progression in parallel bars x30' total each including normal BOS eyes open, eyes closed, narrow BOS eyes open, eyes closed, balance foam normal BOS eyes open, eyes closed. Increased difficulty noted on balance foam with consistent LOB backwards and required use of UEs on bars to regain balance. Standing balance on wedge for prolonged plantarflexor stretch and facilitation of dorsiflexors to A with anterior weight shift and balance. Requires cues at pelvis for R knee extension, pelvic rotation, and cueing at shoulders/trunk for alignment. Performed alternating LE stepping with therapist providing assist for neutral pelvis with improvement in weight bearing on RLE, weight shifting, stance control. Leg length assessed supine on mat table with BLE 95 cm in length. Kinetron x3 trials of 2-3 min each with BUE support and facilitation  for R hip extension and upright posture. Improved alignment and weight bearing with repetition. W/c propulsion to return to room with BUE and occasional BLE to A with navigation. Stand pivot to return to recliner with min guard. Remained seated in recliner with all needs in reach at completion of  session.   Tx 2: Pt received seated in recliner with granddaughter present, no c/o pain and agreeable to treatment. Pt donned TEDs and shoes to BLE with modA. W/c propulsion to/from gym with S. Gait training with litegait x5 min; facilitation at R glutes during stance and hamstrings during swing to A with foot clearance. Note increased foot drag with increased time. Pt returned to room, transferred w/c >recliner with close S. Remained seated in recliner with all needs in reach at completion of session.  Therapy Documentation Precautions:  Precautions Precautions: Fall Restrictions Weight Bearing Restrictions: No  See Function Navigator for Current Functional Status.  Therapy/Group: Individual Therapy  Luberta Mutter 12/22/2015, 3:48 PM

## 2015-12-22 NOTE — Progress Notes (Signed)
Occupational Therapy Session Note  Patient Details  Name: Douglas Carrillo MRN: [id number] Date of Birth: 02/24/1928  Today's Date: 12/22/2015 OT Individual Time: 1115-1150 OT Individual Time Calculation (min): 35 min    Short Term Goals: Week 2:  OT Short Term Goal 1 (Week 2): STG = LTGs due to remaining LOS  Skilled Therapeutic Interventions/Progress Updates:    Upon entering the room, pt seated in recliner chair awaiting therapist with no c/o pain this session. Pt verbalized activities/tasks completed with therapy prior to this session. Pt agreeable to OT intervention with focus on R hand strengthening and coordination tasks. OT provided additional exercises for coordinaton and strength with use of red, medium soft resistance theraputty. Pt returned demonstrations with min verbal cues for technique. Pt stating,  "These are harder than they look." OT also instructed pt in rapid alternating movements tasks, picking up small manipulatives, and finger isolation/pairing exercises with R hand. Pt required 2 rest break secondary to hand fatigue. Pt remained seated in recliner chair with call bell and all needed items within reach upon exiting the room.  Therapy Documentation Precautions:  Precautions Precautions: Fall Restrictions Weight Bearing Restrictions: No General:   Vital Signs: Oxygen Therapy O2 Device: Not Delivered  See Function Navigator for Current Functional Status.   Therapy/Group: Individual Therapy  [name] 12/22/2015, 12:08 PM

## 2015-12-22 NOTE — Progress Notes (Signed)
[place] PHYSICAL MEDICINE & REHABILITATION     PROGRESS NOTE    Subjective/Complaints:  Inc  Loose BM this am  ROS: Denies CP, SOB, N/V/D.no bladder issues  Objective: Vital Signs: Blood pressure 143/68, pulse 94, temperature 98.5 F (36.9 C), temperature source Oral, resp. rate 18, weight 92.6 kg (204 lb 2.3 oz), SpO2 99 %. No results found. No results for input(s): WBC, HGB, HCT, PLT in the last 72 hours.  Recent Labs  12/22/15 0430  CREATININE 1.35*   CBG (last 3)   Recent Labs  12/21/15 1636 12/21/15 2101 12/22/15 0644  GLUCAP 136* 88 111*    Wt Readings from Last 3 Encounters:  12/22/15 92.6 kg (204 lb 2.3 oz)  12/13/15 92.987 kg (205 lb)  12/12/15 92.987 kg (205 lb)    Physical Exam:  BP 143/68 mmHg  Pulse 94  Temp(Src) 98.5 F (36.9 C) (Oral)  Resp 18  Wt 92.6 kg (204 lb 2.3 oz)  SpO2 99% Constitutional: He appears well-developed and well-nourished. NAD. Vital signs reviewed.  Appears younger than stated age.  HENT: Normocephalic and atraumatic.  Mouth/Throat: No oropharyngeal exudate.  Eyes: Conjunctivae and EOM are normal. No scleral icterus.  Cardiovascular: Normal rate and regular rhythm. Holosystolic murmur.  Respiratory: Effort normal and breath sounds normal. No stridor. He exhibits no tenderness.  GI: Soft. Bowel sounds are normal. He exhibits no distension. There is no tenderness.  Musculoskeletal: He exhibits edema (right ankle with trace edema). He exhibits no tenderness.  Neurological: He is alert and oriented to person, place, and time.  Right facial weakness with minimal dysarthria.  Able to follow one and two step motor commands without difficulty.  Sensation diminished to light touch RUE/RLE (improving) Motor: LUE/LLE: 5/5 RUE: shoulder abduction 4/5, elbow flexion 4/5, elbow extension 3+/5, hand grip 3+/5 RLE: hip flexion 2+/5, ankle dorsi/plantar flexion 2/5  Skin: Skin is warm and dry. No rash noted.   Psychiatric: He has a normal mood and affect. His behavior is normal. Judgment and thought content normal.    Assessment/Plan: 1. 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 Physiatrist is providing close team supervision and 24 hour management of active medical problems listed below. Physiatrist and rehab team continue to assess barriers to discharge/monitor patient progress toward functional and medical goals.  Function:  Bathing Bathing position   Position: Shower  Bathing parts Body parts bathed by patient: Right arm, Right upper leg, Left arm, Left upper leg, Chest, Abdomen, Front perineal area, Left lower leg, Buttocks, Back, Right lower leg Body parts bathed by helper: Right lower leg  Bathing assist Assist Level: Supervision or verbal cues      Upper Body Dressing/Undressing Upper body dressing   What is the patient wearing?: Pull over shirt/dress     Pull over shirt/dress - Perfomed by patient: Thread/unthread right sleeve, Thread/unthread left sleeve, Put head through opening, Pull shirt over trunk          Upper body assist Assist Level: Set up   Set up : To obtain clothing/put away  Lower Body Dressing/Undressing Lower body dressing   What is the patient wearing?: Underwear, Pants, Non-skid slipper socks, Shoes, [employer]  - Performed by patient: Thread/unthread right underwear leg, Thread/unthread left underwear leg, Pull underwear up/down   Pants- Performed by patient: Thread/unthread right pants leg, Thread/unthread left pants leg, Pull pants up/down Pants- Performed by helper: Thread/unthread right  pants leg Non-skid slipper socks- Performed by patient: Don/doff right sock, Don/doff left sock   Socks - Performed by patient: Don/doff right sock, Don/doff left sock   Shoes - Performed by patient: Don/doff left shoe, Fasten right,  Fasten left Shoes - Performed by helper: Don/doff right shoe       TED Hose - Performed by helper: Don/doff right TED hose, Don/doff left TED hose  Lower body assist Assist for lower body dressing: Touching or steadying assistance (Pt > 75%)      Toileting Toileting   Toileting steps completed by patient: Adjust clothing prior to toileting, Performs perineal hygiene, Adjust clothing after toileting   Toileting Assistive Devices: Grab bar or rail  Toileting assist Assist level: Touching or steadying assistance (Pt.75%)   Transfers Chair/bed transfer   Chair/bed transfer method: Stand pivot Chair/bed transfer assist level: Moderate assist (Pt 50 - 74%/lift or lower) Chair/bed transfer assistive device: Armrests     Locomotion Ambulation     Max distance: 75 Assist level: Touching or steadying assistance (Pt > 75%)   Wheelchair   Type: Manual Max wheelchair distance: 175 Assist Level: Supervision or verbal cues  Cognition Comprehension Comprehension assist level: Follows basic conversation/direction with no assist  Expression Expression assist level: Expresses basic needs/ideas: With no assist  Social Interaction Social Interaction assist level: Interacts appropriately with others - No medications needed.  Problem Solving Problem solving assist level: Solves basic 90% of the time/requires cueing < 10% of the time  Memory Memory assist level: Recognizes or recalls 90% of the time/requires cueing < 10% of the time    Medical Problem List and Plan: 1. 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 DVT Prophylaxis/Anticoagulation: Mechanical: Sequential compression devices, below knee Bilateral lower extremities, Pharmaceutical: Lovenox 3. Pain Management: Tylenol and Norco PRN.  4. Mood: LCSW to follow for evaluation and support 5. Neuropsych: This patient is capable of making decisions on his own  behalf. 6. Skin/Wound Care: Maintain adequate nutrition and hydration status. Routine pressure relief measures.  7. Fluids/Electrolytes/Nutrition: Monitor I/O.Fluid 653ml yesterday              Protein supplement added  8. HTN Cont-toprol, Imdur , Lasix, BP in desired range Monitor VS during therapies and at rest and adjust as indicated to ensure goal BP maintained  9. DM HbA1c 6.8 on 12/22-good control FSBS trend:  CBG (last 3)   Recent Labs  12/21/15 1636 12/21/15 2101 12/22/15 0644  GLUCAP 136* 88 111*   no med adjustment  10. CAD s/p recent CABG-on Imdur Monitor in accordance with increased physical activity and avoid UE resistance excercises 11. OSA Cont O2 at night Evaluate signs of daytime somnolence. 12. AS See CAD 13. Anemia Hb 8.4 on 12/24  Will periodically monitor Hb to ensure adequate perfusion and energy for therapies Will transfuse if pt becomes symptomatic and/or Hb <7.0 14. Spastic bladder Cont Ditropan-PVRs normal 15. GERD Cont Protonix 16. Hypokalemia: Normalized after supplementation 13meq K+ supplement 3.9 on 12/24 recheck today, Cr is higher , ?hydration related, will reduce lasix dose 17. COPD Cont Spiriva- currently asymptomatic 18.  Constipation-inc loose BM today, reduce senna       LOS (Days) 7 A FACE TO FACE EVALUATION WAS PERFORMED  Alysia Penna E 12/22/2015 7:15 AM

## 2015-12-22 NOTE — Progress Notes (Signed)
Occupational Therapy Weekly Progress Note  Patient Details  Name: Douglas Carrillo MRN: [id number] Date of Birth: 1928/06/03  Beginning of progress report period: December 16, 2015 End of progress report period: December 22, 2015  Today's Date: 12/22/2015 OT Individual Time: 4696-2952 OT Individual Time Calculation (min): 60 min    Patient has met 4 of 4 short term goals.  Pt making steady progress towards goals with pt able to complete functional mobility and transfers with min assist with use of RW.  Pt demonstrating increased stability in standing with dynamic standing and completing LB hygiene and self-care tasks.  Pt with improved use of RUE as pt is using as dominant UE, continues to report handwriting is not at baseline but has become legible.  [facility] HEP and handwriting tasks have been provided for down time between sessions.  Patient continues to demonstrate the following deficits: RUE/RLE weakness, decreased balance reactions, impaired activity tolerance and therefore will continue to benefit from skilled OT intervention to enhance overall performance with BADL, iADL and Reduce care partner burden.  Patient progressing toward long term goals..  Continue plan of care.  OT Short Term Goals Week 1:  OT Short Term Goal 1 (Week 1): Pt will complete toilet transfer (BSC over toilet) with min A OT Short Term Goal 1 - Progress (Week 1): Met OT Short Term Goal 2 (Week 1): Pt will maintain dynamic standing balance during LB dressing task with min A OT Short Term Goal 2 - Progress (Week 1): Met OT Short Term Goal 3 (Week 1): Pt will complete 2 groomings tasks in standing with supervision to increase functional activity tolerance OT Short Term Goal 3 - Progress (Week 1): Met OT Short Term Goal 4 (Week 1): Pt will use R UE at stabilizer level mod I OT Short Term Goal 4 - Progress (Week 1): Met Week 2:  OT Short Term Goal 1 (Week 2): STG = LTGs due to remaining LOS  Skilled Therapeutic  Interventions/Progress Updates:    Treatment session with focus on dynamic standing balance, functional use of RUE, and balance reactions.  Engaged in [place] with focus on use of RUE, balance, and reaction time.  During initial Mode A trials pt able to press 28, 31, and 31 lights with average reaction time of 2 seconds using only RUE.  Increased challenge with 2 second limit with pt able to reach 27/38 and 30/39 with reaction time of 1.40 and 1.38, pt with more errors reaching for outer ring.  Pt stood on foam surface for final [employer] activity with no time limit, but able to complete 41 with reaction time of 1.46 with UE support on RW then 33 with reaction time of 1.82 without UE support on RW, therapist provided min guard during dynamic standing on compliant surface.  Additional balance task in standing on foam surface with pt obtaining clothespins on Rt with Rt hand, placing on vertical rod in standing, and then removing and placing in box on Lt to challenge trunk control with reaching across midline.  Therapist provided min guard -supervision with standing balance this session.  Therapy Documentation Precautions:  Precautions Precautions: Fall Restrictions Weight Bearing Restrictions: No General:   Vital Signs: Therapy Vitals Temp: 98.5 F (36.9 C) Temp Source: Oral Pulse Rate: 94 Resp: 18 BP: (!) 143/68 mmHg Patient Position (if appropriate): Lying Oxygen Therapy SpO2: 99 % O2 Device: Not Delivered Pain:  Pt with no c/o pain  See Function Navigator for Current Functional Status.   Therapy/Group: Individual  Therapy  [name] 12/22/2015, 7:28 AM

## 2015-12-23 ENCOUNTER — Inpatient Hospital Stay (HOSPITAL_COMMUNITY): Payer: Medicare Other

## 2015-12-23 ENCOUNTER — Inpatient Hospital Stay (HOSPITAL_COMMUNITY): Payer: Medicare Other | Admitting: Occupational Therapy

## 2015-12-23 ENCOUNTER — Inpatient Hospital Stay (HOSPITAL_COMMUNITY): Payer: Medicare Other | Admitting: Physical Therapy

## 2015-12-23 DIAGNOSIS — N179 Acute kidney failure, unspecified: Secondary | ICD-10-CM

## 2015-12-23 LAB — GLUCOSE, CAPILLARY
Glucose-Capillary: 119 mg/dL — ABNORMAL HIGH (ref 65–99)
Glucose-Capillary: 98 mg/dL (ref 65–99)
Glucose-Capillary: 106 mg/dL — ABNORMAL HIGH (ref 65–99)
Glucose-Capillary: 138 mg/dL — ABNORMAL HIGH (ref 65–99)

## 2015-12-23 MED ORDER — METOPROLOL SUCCINATE ER 25 MG PO TB24
25.0000 mg | ORAL_TABLET | Freq: Every day | ORAL | Status: DC
Start: 2015-12-23 — End: 2015-12-26
  Administered 2015-12-23 – 2015-12-25 (×3): 25 mg via ORAL
  Filled 2015-12-23 (×3): qty 1

## 2015-12-23 NOTE — Progress Notes (Signed)
Physical Therapy Session Note  Patient Details  Name: Douglas Carrillo MRN: [id number] Date of Birth: 26-Jan-1928  Today's Date: 12/23/2015 PT Individual Time: [phone] PT Individual Time Calculation (min): 43 min   Short Term Goals: Week 2:  PT Short Term Goal 1 (Week 2): = LTG due to estimated length of stay  Skilled Therapeutic Interventions/Progress Updates:    Session focused on functional transfers at supervision level with RW, dynamic sitting and standing balance for pt to don pants to prepare for session, gait training with RW with supervision/steady assist with cues for posture, heel strike on R, and placement of RW to maintain good positioning, stair negotiation for functional strengthening and to prepare for home entry with steady assist, and neuro re-ed to address movement re-education and forced use of RLE. Performed sit to stands without UE support with focus on eccentric control and forced use of RLE, progressed to doing this with 2" block under LLE with hands and then without use of hands x 10 reps for each. Pt able to progress initially with light mod assist to steady assist with min assist for balance. A few repetitions but with LOB posterior requiring uncontrolled descent to mat. Overall pt with good progress and motivated to improve.   Therapy Documentation Precautions:  Precautions Precautions: Fall Restrictions Weight Bearing Restrictions: No  Pain:  Denies pain but c/o upset stomach this morning.    See Function Navigator for Current Functional Status.   Therapy/Group: Individual Therapy  Canary Brim Ivory Broad, PT, DPT  12/23/2015, 10:41 AM

## 2015-12-23 NOTE — Progress Notes (Signed)
Physical Therapy Session Note  Patient Details  Name: Douglas Carrillo MRN: [id number] Date of Birth: February 05, 1928  Today's Date: 12/23/2015 PT Individual Time: 1435-1535 PT Individual Time Calculation (min): 60 min   Short Term Goals: Week 1:  PT Short Term Goal 1 (Week 1): Pt will perform bed mobility mod I from flat bed PT Short Term Goal 1 - Progress (Week 1): Not met PT Short Term Goal 2 (Week 1): Pt will perform stand pivot transfer consistent S PT Short Term Goal 2 - Progress (Week 1): Met PT Short Term Goal 3 (Week 1): Pt will perform gait with LRAD x100' with min guard and LRAD PT Short Term Goal 3 - Progress (Week 1): Met PT Short Term Goal 4 (Week 1): Pt will perform ascent/descent of 4 6-inch stairs with minA PT Short Term Goal 4 - Progress (Week 1): Met Week 2:  PT Short Term Goal 1 (Week 2): = LTG due to estimated length of stay  Skilled Therapeutic Interventions/Progress Updates:   Pt demonstrates improvement in session initiating free gait. Pt benefits most from cues for weight shift and step length in gait followed by pelvic PNF with emphasis on posterior depression. Pt would continue to benefit from skilled PT services to increase functional mobility.  Therapy Documentation Precautions:  Precautions Precautions: Fall Restrictions Weight Bearing Restrictions: No Pain: Pain Assessment Pain Assessment: No/denies pain Mobility:  Transfers with cues for LE placemrnt and weight shift Locomotion :   Pt perform gait with RW CGA with cues for pacing and posture.  Other Treatments:  Pt educated on rehab plan, safety in mobility, core control, and forced use. Pt performs transfers x20 in session. Marching 2x10. Marching PNF resisted in anterior elevation and with/without DF resistance. Sidelying pelvic PNF into anterior elevation and posterior depression with and without extremities. Pt performs BLE pre-gait 2x10. Pt perform B/L weight shifting pre-gait 2x10. Free gait 10'x4,  15'x1 with cues for weight shift, R lateral L/S flexion, posture. Bed mobility x4 in session.   See Function Navigator for Current Functional Status.   Therapy/Group: Individual Therapy  Monia Pouch 12/23/2015, 2:57 PM

## 2015-12-23 NOTE — Progress Notes (Signed)
[place] PHYSICAL MEDICINE & REHABILITATION     PROGRESS NOTE    Subjective/Complaints:  Patient seen sitting up in his recliner watching TV. He states he woke up at 4 AM this morning, but otherwise slept well overnight.  ROS: Denies CP, SOB, N/V/D.no bladder issues  Objective: Vital Signs: Blood pressure 176/80, pulse 88, temperature 98 F (36.7 C), temperature source Oral, resp. rate 18, weight 92.6 kg (204 lb 2.3 oz), SpO2 98 %. No results found. No results for input(s): WBC, HGB, HCT, PLT in the last 72 hours.  Recent Labs  12/22/15 0430 12/22/15 0940  NA  --  139  K  --  3.7  CL  --  107  GLUCOSE  --  117*  BUN  --  14  CREATININE 1.35* 1.31*  CALCIUM  --  9.5   CBG (last 3)   Recent Labs  12/22/15 1630 12/22/15 2051 12/23/15 0643  GLUCAP 101* 132* 119*    Wt Readings from Last 3 Encounters:  12/22/15 92.6 kg (204 lb 2.3 oz)  12/13/15 92.987 kg (205 lb)  12/12/15 92.987 kg (205 lb)    Physical Exam:  BP 176/80 mmHg  Pulse 88  Temp(Src) 98 F (36.7 C) (Oral)  Resp 18  Wt 92.6 kg (204 lb 2.3 oz)  SpO2 98% Constitutional: He appears well-developed and well-nourished. NAD. Vital signs reviewed.  Appears younger than stated age.  HENT: Normocephalic and atraumatic.  Mouth/Throat: No oropharyngeal exudate.  Eyes: Conjunctivae and EOM are normal. No scleral icterus.  Cardiovascular: Normal rate and regular rhythm. Holosystolic murmur.  Respiratory: Effort normal and breath sounds normal. No stridor. He exhibits no tenderness.  GI: Soft. Bowel sounds are normal. He exhibits no distension. There is no tenderness.  Musculoskeletal: He exhibits edema (right ankle with trace edema). He exhibits no tenderness.  Neurological: He is alert and oriented to person, place, and time.  Right facial weakness with minimal dysarthria.  Able to follow commands without difficulty.  Sensation diminished to light touch RUE/RLE (improving) Motor: LUE/LLE:  Antigravity strength  RUE: shoulder abduction 4/5, elbow flexion 4/5, elbow extension 4/5, hand grip 4/5 RLE: hip flexion 2+/5, ankle dorsi/plantar flexion 2/5  Skin: Skin is warm and dry. No rash noted.  Psychiatric: He has a normal mood and affect. His behavior is normal. Judgment and thought content normal.    Assessment/Plan: 1. 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 Physiatrist is providing close team supervision and 24 hour management of active medical problems listed below. Physiatrist and rehab team continue to assess barriers to discharge/monitor patient progress toward functional and medical goals.  Function:  Bathing Bathing position   Position: Shower  Bathing parts Body parts bathed by patient: Right arm, Right upper leg, Left arm, Left upper leg, Chest, Abdomen, Front perineal area, Left lower leg, Buttocks, Back, Right lower leg Body parts bathed by helper: Right lower leg  Bathing assist Assist Level: Supervision or verbal cues      Upper Body Dressing/Undressing Upper body dressing   What is the patient wearing?: Pull over shirt/dress     Pull over shirt/dress - Perfomed by patient: Thread/unthread right sleeve, Thread/unthread left sleeve, Put head through opening, Pull shirt over trunk          Upper body assist Assist Level: Set up   Set up : To obtain clothing/put away  Lower Body Dressing/Undressing Lower body dressing  What is the patient wearing?: Pants Underwear - Performed by patient: Thread/unthread right underwear leg, Thread/unthread left underwear leg, Pull underwear up/down   Pants- Performed by patient: Thread/unthread left pants leg, Thread/unthread right pants leg Pants- Performed by helper: Thread/unthread right pants leg Non-skid slipper socks- Performed by patient: Don/doff right sock, Don/doff left sock    Socks - Performed by patient: Don/doff right sock, Don/doff left sock   Shoes - Performed by patient: Don/doff left shoe, Fasten right, Fasten left Shoes - Performed by helper: Don/doff right shoe       TED Hose - Performed by helper: Don/doff right TED hose, Don/doff left TED hose  Lower body assist Assist for lower body dressing: Supervision or verbal cues      Toileting Toileting   Toileting steps completed by patient: Adjust clothing prior to toileting, Performs perineal hygiene, Adjust clothing after toileting   Toileting Assistive Devices: Grab bar or rail  Toileting assist Assist level: Touching or steadying assistance (Pt.75%)   Transfers Chair/bed transfer   Chair/bed transfer method: Stand pivot Chair/bed transfer assist level: Touching or steadying assistance (Pt > 75%) Chair/bed transfer assistive device: Walker, Air cabin crew     Max distance: 230 Assist level: Touching or steadying assistance (Pt > 75%)   Wheelchair   Type: Manual Max wheelchair distance: 175 Assist Level: Supervision or verbal cues  Cognition Comprehension Comprehension assist level: Follows basic conversation/direction with no assist  Expression Expression assist level: Expresses basic needs/ideas: With no assist  Social Interaction Social Interaction assist level: Interacts appropriately with others - No medications needed.  Problem Solving Problem solving assist level: Solves basic 90% of the time/requires cueing < 10% of the time  Memory Memory assist level: Recognizes or recalls 90% of the time/requires cueing < 10% of the time    Medical Problem List and Plan: 1. 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 DVT Prophylaxis/Anticoagulation: Mechanical: Sequential compression devices, below knee Bilateral lower extremities, Pharmaceutical: Lovenox 3. Pain Management: Tylenol and Norco PRN.   4. Mood: LCSW to follow for evaluation and support 5. Neuropsych: This patient is capable of making decisions on his own behalf. 6. Skin/Wound Care: Maintain adequate nutrition and hydration status. Routine pressure relief measures.  7. Fluids/Electrolytes/Nutrition: Monitor I/O.Fluid 676ml yesterday              Protein supplement added  8. HTN Cont, Imdur , Lasix Monitor VS during therapies and at rest and adjust as indicated to ensure goal BP maintained              Toprol increased to 25 on 12/31  9. DM HbA1c 6.8 on 12/22 FSBS trend:  CBG (last 3)   Recent Labs  12/22/15 1630 12/22/15 2051 12/23/15 0643  GLUCAP 101* 132* 119*   no med adjustment  10. CAD s/p recent CABG-on Imdur Monitor in accordance with increased physical activity and avoid UE resistance excercises 11. OSA Cont O2 at night Evaluate signs of daytime somnolence. 12. AS See CAD 13. Anemia Hb 8.4 on 12/24  Will periodically monitor Hb to ensure adequate perfusion and energy for therapies Will transfuse if pt becomes symptomatic and/or Hb <7.0 14. Spastic bladder Cont Ditropan-PVRs normal  15. GERD Cont Protonix 16. Hypokalemia: Normalized after supplementation 62meq K+ supplement 3.7 pulse is 30 CR 1.31 on 12/30, Lasix reduced. Will continue to monitor and encourage by mouth intake 17. COPD Cont Spiriva- currently asymptomatic 18.  Constipation-senna reduced on 12/30        LOS (Days) 8 A FACE TO FACE EVALUATION WAS PERFORMED  Douglas Carrillo 12/23/2015 7:44 AM

## 2015-12-23 NOTE — Progress Notes (Signed)
Occupational Therapy Session Note  Patient Details  Name: Douglas Carrillo MRN: [id number] Date of Birth: April 06, 1928  Today's Date: 12/23/2015 OT Individual Time: 1345-1430 OT Individual Time Calculation (min): 45 min    Short Term Goals: Week 2:  OT Short Term Goal 1 (Week 2): STG = LTGs due to remaining LOS  Skilled Therapeutic Interventions/Progress Updates:    Pt seen for OT session focusing on LE strengthening, UE coordination, and functional standing balance. Pt sitting up in recliner upon arrival, agreeable to tx session. He ambulated to therapy gym using RW with supervision, demonstrating increased ability to lift R heel when ambulating compared to previous sessions as pt aware of dragging foot during PT session and making effort to clear foot completely. Sitting on therapy mat, pt completed reaching task with R UE, required to lean forward off mat for butt to clean mat and placing clothes pin on clothes pin tree using R UE. With mat in low position, pt using L UE to compensate and assist with powering up, however, once mat elevated slightly, pt able to clear mat using only LEs.  He then completed ball toss game, standing without AD to play toss with granddaughter. Steadying assist provided with VCs for upright posture and wider BOS for increased stability. Educated regarding importance of re -attaining balance once he begins to feel off balance for safety reasons as pt with anterior lean, however, only corrected with VCs for upright posture. Pt stated that he has been practicing hand witting with R UE. Pt wrote name on paper, able to write legiablly in cursive. Provided pt with built up gripper, however, pt voiced that gripper not helpful and preferred to just use standard witting implement. Pt voiced desire to complete exercises on [employer] as he had in PT session on previous day. He completed x2 trials of standing exercise on machine, completing ~45 steps each trial with B UE support and  steadying assist. Seated rest breaks provided btwn trials. Pt left sitting on machine at end of session for hand off to PT.   Therapy Documentation Precautions:  Precautions Precautions: Fall Restrictions Weight Bearing Restrictions: No Pain:   No/ denies pain  See Function Navigator for Current Functional Status.   Therapy/Group: Individual Therapy  Lewis, Donat Humble C 12/23/2015, 2:12 PM

## 2015-12-23 NOTE — Progress Notes (Signed)
Occupational Therapy Session Note  Patient Details  Name: Douglas Carrillo MRN: [id number] Date of Birth: 27-Mar-1928  Today's Date: 12/23/2015 OT Individual Time: 1020-1100 OT Individual Time Calculation (min): 40 min    Short Term Goals: Week 2:  OT Short Term Goal 1 (Week 2): STG = LTGs due to remaining LOS  Skilled Therapeutic Interventions/Progress Updates:    Pt seen for OT ADL bathing and dressing session. Pt sitting up in recliner upon arrival, voicing desire to complete showering task. He ambulated throughout room using RW with close supervision. Shower transfer completed with supervision. He bathed seated on tub bench at supervision- mod I level. He dressed seated on tub bench, completing sit <> stand with supervision at [place]. TED hose donned total A. Grooming completed standing at sink with supervision. Pt able to utilize R UE in functional tasks without difficulty. HE required periodic seated rest breaks throughout session due to decreased functional activity tolerance. Pt returned to recliner for LE exercises including hip flexion, leg lifts, and knee lifts. All completed x10 reps on B sides. Educated regarding importance of quality vs. quantity with exercises. Pt left sitting in recliner at end of session, all needs in reach.    Therapy Documentation Precautions:  Precautions Precautions: Fall Restrictions Weight Bearing Restrictions: No Pain:   No/ denies pain  See Function Navigator for Current Functional Status.   Therapy/Group: Individual Therapy  Lewis, Gaius Ishaq C 12/23/2015, 6:51 AM

## 2015-12-24 ENCOUNTER — Inpatient Hospital Stay (HOSPITAL_COMMUNITY): Payer: Medicare Other | Admitting: Physical Therapy

## 2015-12-24 ENCOUNTER — Inpatient Hospital Stay (HOSPITAL_COMMUNITY): Payer: Medicare Other | Admitting: Occupational Therapy

## 2015-12-24 LAB — GLUCOSE, CAPILLARY
GLUCOSE-CAPILLARY: 121 mg/dL — AB (ref 65–99)
GLUCOSE-CAPILLARY: 94 mg/dL (ref 65–99)
Glucose-Capillary: 102 mg/dL — ABNORMAL HIGH (ref 65–99)
Glucose-Capillary: 111 mg/dL — ABNORMAL HIGH (ref 65–99)

## 2015-12-24 NOTE — Progress Notes (Signed)
Occupational Therapy Session Note  Patient Details  Name: Racer Brean MRN: [id number] Date of Birth: 10/24/1928  Today's Date: 12/24/2015 OT Individual Time: 0706-0800 OT Individual Time Calculation (min): 54 min    Short Term Goals: Week 2:  OT Short Term Goal 1 (Week 2): STG = LTGs due to remaining LOS  Skilled Therapeutic Interventions/Progress Updates:    Pt seen for OT therapy sesion focusing on LE strengthening, functional standing balance,and ADL re-training. Pt sitting up in recliner upon arrival, agreeable to tx session. He declined full bathing task this AM. He dressed LB seated in recliner. Pt with 1 LOB episode when standing to pull pants up, able to regain balance holding onto bed rail. He completed grooming task standing at sink with supervision. He ambulated to therapy gym using RW with supervision. In gym, pt stood on foam mat and completed functional reaching task using RW. Pt required to reach to place horseshoes on overhead basketball hoop, bending to retrieve horseshoes, and crossing midline to reach to place on rim. Pt able to complete with min steadying assist and one UE support on RW. Completed x3 sets with seated rest breaks btwn trials. Pt voiced during session he felt R LE weakness is his biggest challenge currently.  Pt returned to supine on mat and completed x3 sets of 10 bridging exercises with rest breaks provided btwn sets. He then completed hip flexor exercises in supine completing "clam opening" exercises x2 sets of 10 on B sides. Pt ambulated back to room at end of session, left sitting in recline, set-up with breakfast and all needs in reach.    Therapy Documentation Precautions:  Precautions Precautions: Fall Restrictions Weight Bearing Restrictions: No Pain:    No/ denies pain See Function Navigator for Current Functional Status.   Therapy/Group: Individual Therapy  Lewis, Kimora Stankovic C 12/24/2015, 6:44 AM

## 2015-12-24 NOTE — Progress Notes (Signed)
Physical Therapy Session Note  Patient Details  Name: Douglas Carrillo MRN: [id number] Date of Birth: [birth year]  Today's Date: 12/24/2015 PT Individual Time: 1000-1100 Treatment Session 2: [id number] PT Individual Time Calculation (min): 60 min Treatment Session 2: 90 min  Short Term Goals: Week 2:  PT Short Term Goal 1 (Week 2): = LTG due to estimated length of stay  Skilled Therapeutic Interventions/Progress Updates:    Pt received up in recliner, agreeable to PT session, c/o R hip/low back pain, but refusing medication for it. Gait Training - PT instructs pt in ambulation with RW req CGA progressing to SBA with verbal cues to stay inside of RW and to pick up R LE more x 175'. Neuromuscular Reeducation - PT administers Berg Balance Test and pt scores: 26/56, indicating high risk for falls. Therapeutic Exercise - PT instructs pt in standing B LE strengthening exercises at kitchen counter: heel/toe raises, high knee marching with both hands on kitchen sink (pt unsafe attempting facing sideways to sink with only 1 UE support), standing hip abduction with both hands on sink, standing hip extension, and standing knee flexion : x 10 reps each. PT gave pt a piece of dicem to place under cushion that was in recliner due to cushion sliding forward and it being an unsafe environment. Pt ended up in recliner with all needs in reach.   Treatment Session 2: Pt received up in recliner with daughter and 2 grandaughters present entire session. Community Integration: PT instructs pt in ambulation in community setting req CGA-SBA and one episode of posterior LOB when turning around on low ramp req min A to correct balance. Pt ambulates > 200' on various floor surfaces: tile, cement, brick, laminate, as well as over elevator thresholds. Therapeutic Activity - PT instructs pt in car transfer req CGA-SBA for safety. PT initiates family training with daughter Douglas Carrillo, showing her how to protect pt's head when getting in and out  of car, as well as assisting in stabilizing the RW when pt stands from low car. Therapeutic Exercise: PT instructs pt in B LE ROM and strengthening exercises with active assist and tactile cues as needed: bridges, SLR, side lie hip abduction: x 10 reps each. Pt ended in recliner in room with family and all needs in reach. Continue per PT POC.   Therapy Documentation Precautions:  Precautions Precautions: Fall Restrictions Weight Bearing Restrictions: No Pain: Pain Assessment Pain Assessment: 0-10 Pain Score: 6  Pain Type: Acute pain Pain Location: Back Pain Orientation: Right;Lower Pain Descriptors / Indicators: Aching Pain Onset: On-going Pain Intervention(s): Rest;Repositioned Multiple Pain Sites: No Treatment Session 2: Pt denies pain.    Balance: Balance Balance Assessed: Yes Standardized Balance Assessment Standardized Balance Assessment: Berg Balance Test Berg Balance Test Sit to Stand: Needs minimal aid to stand or to stabilize Standing Unsupported: Able to stand 2 minutes with supervision Sitting with Back Unsupported but Feet Supported on Floor or Stool: Able to sit safely and securely 2 minutes Stand to Sit: Sits independently, has uncontrolled descent Transfers: Able to transfer with verbal cueing and /or supervision Standing Unsupported with Eyes Closed: Able to stand 10 seconds with supervision Standing Ubsupported with Feet Together: Needs help to attain position and unable to hold for 15 seconds (held 15 seconds before LOB posterior) From Standing, Reach Forward with Outstretched Arm: Can reach forward >12 cm safely (5") From Standing Position, Pick up Object from Floor: Able to pick up shoe, needs supervision From Standing Position, Turn to Look Behind Over each  Shoulder: Looks behind one side only/other side shows less weight shift Turn 360 Degrees: Needs assistance while turning Standing Unsupported, Alternately Place Feet on Step/Stool: Able to complete >2  steps/needs minimal assist Standing Unsupported, One Foot in Front: Needs help to step but can hold 15 seconds Standing on One Leg: Tries to lift leg/unable to hold 3 seconds but remains standing independently Total Score: 26   See Function Navigator for Current Functional Status.   Therapy/Group: Individual Therapy  Douglas Carrillo 12/24/2015, 10:03 AM

## 2015-12-24 NOTE — Progress Notes (Signed)
[facility]ICAL [facility]ICINE & REHABILITATION     PROGRESS NOTE    Subjective/Complaints:  Pt seen this morning sitting up in his recliner after having completed 1 hour of therapy. He states he slept well overnight and has questions about an outside medication that was prescribed to him, however this appears to have been an error.  ROS: Denies CP, SOB, N/V/D.no bladder issues  Objective: Vital Signs: Blood pressure 155/64, pulse 78, temperature 98.3 F (36.8 C), temperature source Oral, resp. rate 18, weight 92.6 kg (204 lb 2.3 oz), SpO2 96 %. No results found. No results for input(s): WBC, HGB, HCT, PLT in the last 72 hours.  Recent Labs  12/22/15 0430 12/22/15 0940  NA  --  139  K  --  3.7  CL  --  107  GLUCOSE  --  117*  BUN  --  14  CREATININE 1.35* 1.31*  CALCIUM  --  9.5   CBG (last 3)   Recent Labs  12/23/15 1641 12/23/15 2104 12/24/15 0703  GLUCAP 106* 138* 121*    Wt Readings from Last 3 Encounters:  12/22/15 92.6 kg (204 lb 2.3 oz)  12/13/15 92.987 kg (205 lb)  12/12/15 92.987 kg (205 lb)    Physical Exam:  BP 155/64 mmHg  Pulse 78  Temp(Src) 98.3 F (36.8 C) (Oral)  Resp 18  Wt 92.6 kg (204 lb 2.3 oz)  SpO2 96% Constitutional: He appears well-developed and well-nourished. NAD. Vital signs reviewed.  Appears younger than stated age.  HENT: Normocephalic and atraumatic.  Mouth/Throat: No oropharyngeal exudate.  Eyes: Conjunctivae and EOM are normal. No scleral icterus.  Cardiovascular: Normal rate and regular rhythm. Holosystolic murmur.  Respiratory: Effort normal and breath sounds normal. No stridor. He exhibits no tenderness.  GI: Soft. Bowel sounds are normal. He exhibits no distension. There is no tenderness.  Musculoskeletal: He exhibits edema (right ankle with trace edema). He exhibits no tenderness.  Neurological: He is alert and oriented to person, place, and time.  Right facial weakness with minimal dysarthria.  Able to  follow commands without difficulty.  Sensation diminished to light touch RUE/RLE (improving) Motor: LUE/LLE: Antigravity strength  RUE: Antigravity strength RLE: hip flexion 2+/5, ankle dorsi/plantar flexion 2/5  Skin: Skin is warm and dry. No rash noted.  Psychiatric: He has a normal mood and affect. His behavior is normal. Judgment and thought content normal.    Assessment/Plan: 1. 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 Physiatrist is providing close team supervision and 24 hour management of active medical problems listed below. Physiatrist and rehab team continue to assess barriers to discharge/monitor patient progress toward functional and medical goals.  Function:  Bathing Bathing position   Position: Shower  Bathing parts Body parts bathed by patient: Right arm, Right upper leg, Left arm, Left upper leg, Chest, Abdomen, Front perineal area, Left lower leg, Buttocks, Back, Right lower leg Body parts bathed by helper: Right lower leg  Bathing assist Assist Level: More than reasonable time      Upper Body Dressing/Undressing Upper body dressing   What is the patient wearing?: Pull over shirt/dress     Pull over shirt/dress - Perfomed by patient: Thread/unthread right sleeve, Thread/unthread left sleeve, Put head through opening, Pull shirt over trunk          Upper body assist Assist Level: Set up   Set up : To obtain clothing/put  away  Lower Body Dressing/Undressing Lower body dressing   What is the patient wearing?: Pants, Non-skid slipper socks, Ted Hose Underwear - Performed by patient: Thread/unthread right underwear leg, Thread/unthread left underwear leg, Pull underwear up/down   Pants- Performed by patient: Thread/unthread left pants leg, Thread/unthread right pants leg, Pull pants up/down Pants- Performed by helper: Thread/unthread  right pants leg Non-skid slipper socks- Performed by patient: Don/doff right sock, Don/doff left sock   Socks - Performed by patient: Don/doff right sock, Don/doff left sock   Shoes - Performed by patient: Don/doff left shoe, Fasten right, Fasten left Shoes - Performed by helper: Don/doff right shoe       TED Hose - Performed by helper: Don/doff right TED hose, Don/doff left TED hose  Lower body assist Assist for lower body dressing: Touching or steadying assistance (Pt > 75%)      Toileting Toileting   Toileting steps completed by patient: Adjust clothing prior to toileting, Performs perineal hygiene, Adjust clothing after toileting   Toileting Assistive Devices: Grab bar or rail  Toileting assist Assist level: Touching or steadying assistance (Pt.75%)   Transfers Chair/bed transfer   Chair/bed transfer method: Ambulatory Chair/bed transfer assist level: Touching or steadying assistance (Pt > 75%) Chair/bed transfer assistive device: Armrests, Medical sales representative     Max distance: 150 Assist level: Touching or steadying assistance (Pt > 75%)   Wheelchair   Type: Manual Max wheelchair distance: 175 Assist Level: Supervision or verbal cues  Cognition Comprehension Comprehension assist level: Follows basic conversation/direction with no assist  Expression Expression assist level: Expresses basic needs/ideas: With no assist  Social Interaction Social Interaction assist level: Interacts appropriately with others - No medications needed.  Problem Solving Problem solving assist level: Solves basic problems with no assist  Memory Memory assist level: Recognizes or recalls 90% of the time/requires cueing < 10% of the time    Medical Problem List and Plan: 1. 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 DVT Prophylaxis/Anticoagulation: Mechanical: Sequential compression devices, below knee  Bilateral lower extremities, Pharmaceutical: Lovenox 3. Pain Management: Tylenol and Norco PRN.  4. Mood: LCSW to follow for evaluation and support 5. Neuropsych: This patient is capable of making decisions on his own behalf. 6. Skin/Wound Care: Maintain adequate nutrition and hydration status. Routine pressure relief measures.  7. Fluids/Electrolytes/Nutrition: Monitor I/O.Fluid 626ml yesterday              Protein supplement added  8. HTN Cont, Imdur , Lasix Monitor VS during therapies and at rest and adjust as indicated to ensure goal BP maintained              Toprol increased to 25 on 12/31               Will continue current regimen due to recent increase.               We'll consider further increase tomorrow if necessary   9. DM HbA1c 6.8 on 12/22 FSBS trend:  CBG (last 3)   Recent Labs  12/23/15 1641 12/23/15 2104 12/24/15 0703  GLUCAP 106* 138* 121*   no med adjustment  10. CAD s/p recent CABG-on Imdur Monitor in accordance with increased physical activity and avoid UE resistance excercises 11. OSA Cont O2 at night Evaluate signs of daytime somnolence. 12. AS See CAD 13. Anemia Hb 8.4 on 12/24  Will periodically monitor Hb to ensure adequate perfusion  and energy for therapies Will transfuse if pt becomes symptomatic and/or Hb <7.0 14. Spastic bladder Cont Ditropan  15. GERD Cont Protonix 16. Hypokalemia: Normalized after supplementation 58meq K+ supplement 3.7 pulse is 30 CR 1.31 on 12/30, Lasix reduced. Will continue to monitor and encourage by mouth intake 17. COPD Cont Spiriva- currently asymptomatic 18.  Constipation-senna reduced on 12/30        LOS (Days) 9 A FACE TO FACE EVALUATION WAS PERFORMED  Ankit Lorie Phenix 12/24/2015 8:25 AM

## 2015-12-25 ENCOUNTER — Inpatient Hospital Stay (HOSPITAL_COMMUNITY): Payer: Medicare Other | Admitting: Occupational Therapy

## 2015-12-25 ENCOUNTER — Inpatient Hospital Stay (HOSPITAL_COMMUNITY): Payer: Medicare Other | Admitting: Physical Therapy

## 2015-12-25 ENCOUNTER — Inpatient Hospital Stay (HOSPITAL_COMMUNITY): Payer: Medicare Other

## 2015-12-25 LAB — GLUCOSE, CAPILLARY
GLUCOSE-CAPILLARY: 104 mg/dL — AB (ref 65–99)
GLUCOSE-CAPILLARY: 97 mg/dL (ref 65–99)
GLUCOSE-CAPILLARY: 138 mg/dL — AB (ref 65–99)
Glucose-Capillary: 110 mg/dL — ABNORMAL HIGH (ref 65–99)

## 2015-12-25 NOTE — Progress Notes (Signed)
Occupational Therapy Session Note  Patient Details  Name: Douglas Carrillo MRN: [id number] Date of Birth: 1928-01-31  Today's Date: 12/25/2015 OT Individual Time: 1100-1200 OT Individual Time Calculation (min): 60 min   Short Term Goals: Week 2:  OT Short Term Goal 1 (Week 2): STG = LTGs due to remaining LOS  Skilled Therapeutic Interventions/Progress Updates: Therapeutic activity with focus on improved [facility] of right hand and general strengthening.   Pt completes in-hand manipulation of objects (translating from palm and across fingers extended while seated), assembling and disassembling nuts & bolts (standing) and six-pack finger/hand exercises with supervision and mod instructional cues; written exercises provided.   OT educated pt on resuming use of [name] pt was given by a relative.   Pt ambulated to ortho gym and during ambulation was advised on need for improved strength in side stepping, lateral leg lifts, and compliance with bed exercises already provided by PT.   Pt then completes BUE strengthening using TES (universal gym): 10 reps of straight arm pull downs standing (with min guard), 10 reps of bicep curls seated, and 10 reps of tricep push downs standing.   Pt returned to his room with min guard assist while ambulating with RW (approx 300').     Therapy Documentation Precautions:  Precautions Precautions: Fall Restrictions Weight Bearing Restrictions: No  Vital Signs: Oxygen Therapy SpO2: 96 % O2 Device: Not Delivered   Pain: No/denies   Exercises: General Exercises - Upper Extremity Shoulder Extension: 10 reps;Strengthening;Both;Standing;Bar weights/barbell Bar Weights/Barbell (Shoulder Extension): Other (comment) (10 lbs) Elbow Flexion: Strengthening;Both;10 reps;Seated;Bar weights/barbell Bar Weights/Barbell (Elbow Flexion): Other (comment) (10 lbs) Elbow Extension: 10 reps;Strengthening;Standing;Both;Bar weights/barbell (10 lbs)  See Function Navigator for  Current Functional Status.   Therapy/Group: Individual Therapy  [place] 12/25/2015, 12:24 PM

## 2015-12-25 NOTE — Progress Notes (Signed)
Orthopedic Tech Progress Note Patient Details:  Douglas Carrillo 1928-02-11 [id number] Brace order completed by hanger vendor. Patient ID: [name], male   DOB: 05-Mar-1928, 82 y.o.   MRN: [id number]   Braulio Bosch 12/25/2015, 5:16 PM

## 2015-12-25 NOTE — Progress Notes (Signed)
Orthopedic Tech Progress Note Patient Details:  [name] Apr 06, 1928 [id number]  Patient ID: Douglas Carrillo, male   DOB: 1928-05-14, 82 y.o.   MRN: [id number] Called in advanced brace order; spoke with Ryn with the answering service  Sharlet Sal[place], Makaia Rappa 12/25/2015, 12:22 PM

## 2015-12-25 NOTE — Progress Notes (Signed)
Occupational Therapy Session Note  Patient Details  Name: Douglas Carrillo MRN: [id number] Date of Birth: 03-May-1928  Today's Date: 12/25/2015 OT Individual Time: [id number] OT Individual Time Calculation (min): 73 min    Short Term Goals: Week 2:  OT Short Term Goal 1 (Week 2): STG = LTGs due to remaining LOS  Skilled Therapeutic Interventions/Progress Updates:    Treatment session with focus on ADL retraining, functional mobility, dynamic standing balance, and functional use of dominant RUE.  Pt ambulated to room shower with RW and supervision, pt doffed clothing in standing with use of grab bars for steady assist when in single leg stance.  Educated on sit > stand to increase safety with LB dressing tasks.  Pt completed bathing at sit > stand level in shower at Mod I level with use of grab bars and shower chair.  Pt able to don TEDS this session with increased time and min cues for technique.  Engaged in self-feeding with pt able to open packets, cut and scoop food with dominant RUE without any issue.  Ambulated to therapy gym 163 feet with RW and supervision - min assist.  Engaged in dynamic standing activity with focus on trunk rotation and weight shifting over RLE with reaching towards floor on Rt to retreive items and then up to Lt to place items on target .  Pt required min guard with reaching outside BOS to Lt with 1 LOB when pt fatigued but able to correct with only min assist.  Engaged in handwriting activity with pt reporting marked improvement in handwriting legibility.  Therapy Documentation Precautions:  Precautions Precautions: Fall Restrictions Weight Bearing Restrictions: No General:   Vital Signs: Therapy Vitals Pulse Rate: 90 BP: (!) 158/70 mmHg Oxygen Therapy SpO2: 96 % O2 Device: Not Delivered Pain: Pain Assessment Pain Assessment: No/denies pain Pain Score: 0-No pain  See Function Navigator for Current Functional Status.   Therapy/Group: Individual  Therapy  [name] 12/25/2015, 11:16 AM

## 2015-12-25 NOTE — Progress Notes (Signed)
Physical Therapy Session Note  Patient Details  Name: Douglas Carrillo MRN: [id number] Date of Birth: 1928/06/21  Today's Date: 12/25/2015 PT Individual Time: 0800-0900 PT Individual Time Calculation (min): 60 min   Short Term Goals: Week 2:  PT Short Term Goal 1 (Week 2): = LTG due to estimated length of stay  Skilled Therapeutic Interventions/Progress Updates:    Pt received seated in recliner with RN present administering medication; no c/o pain and agreeable to treatment. Lower body dressing performed while seated in recliner, close S for standing balance while donning pants, UE support on RW. Gait to therapy gym x175' with RW and S. Verbal cues for upright posture, R hip extension in stance. Gait trails 75'x2 with foot-up orthosis and PLS. Noted improvement in foot clearance with foot-up AFO, increased gait speed and quality. With PLS, noted decreased speed, increased hip flexion and trendelenburg in stance and pt report of discomfort, shoe coming off. Notified MD of request for foot-up AFO. Educated pt in purpose of AFO, donning/doffing, and anticipated duration of use as muscular control of dorsiflexors returns. Nustep x10 min on level 4 with BUE/BLE for strengthening, aerobic endurance. Gait with RW to return to room x175' with S. Remained seated in recliner with set-up to eat breakfast at completion of session, all needs in reach.   Therapy Documentation Precautions:  Precautions Precautions: Fall Restrictions Weight Bearing Restrictions: No Pain: Pain Assessment Pain Assessment: No/denies pain Pain Score: 0-No pain   See Function Navigator for Current Functional Status.   Therapy/Group: Individual Therapy  Luberta Mutter 12/25/2015, 8:47 AM

## 2015-12-25 NOTE — Progress Notes (Signed)
Recreational Therapy Session Note  Patient Details  Name: Douglas Carrillo MRN: [id number] Date of Birth: Mar 13, 1928 Today's Date: 12/25/2015  Order received & chart reviewed.  Met with pt & granddaughters to complete leisure screen.  Discussion/education on importance of staying active post discharge, all stated understanding.  PT states he went to the gym daily PTA and would like to return to this when able.  Also dicussed community reintegration including purpose and potential goals.  Pt agreeable to participate if recommended by team.  Will discuss with team and provide follow up.   Prerna Harold 12/25/2015, 2:31 PM

## 2015-12-25 NOTE — Progress Notes (Signed)
Douglas Carrillo PHYSICAL MEDICINE & REHABILITATION     PROGRESS NOTE    Subjective/Complaints:  No issues overnite amb with PT with R foot drag, sup/minA rolling walker  ROS: Denies CP, SOB, N/V/D.no bladder issues  Objective: Vital Signs: Blood pressure 158/70, pulse 90, temperature 97.7 F (36.5 C), temperature source Oral, resp. rate 18, weight 92.6 kg (204 lb 2.3 oz), SpO2 100 %. No results found. No results for input(s): WBC, HGB, HCT, PLT in the last 72 hours.  Recent Labs  12/22/15 0940  NA 139  K 3.7  CL 107  GLUCOSE 117*  BUN 14  CREATININE 1.31*  CALCIUM 9.5   CBG (last 3)   Recent Labs  12/24/15 1640 12/24/15 2123 12/25/15 0704  GLUCAP 94 111* 104*    Wt Readings from Last 3 Encounters:  12/22/15 92.6 kg (204 lb 2.3 oz)  12/13/15 92.987 kg (205 lb)  12/12/15 92.987 kg (205 lb)    Physical Exam:  BP 158/70 mmHg  Pulse 90  Temp(Src) 97.7 F (36.5 C) (Oral)  Resp 18  Wt 92.6 kg (204 lb 2.3 oz)  SpO2 100% Constitutional: He appears well-developed and well-nourished. NAD. Vital signs reviewed.  Appears younger than stated age.  HENT: Normocephalic and atraumatic.  Mouth/Throat: No oropharyngeal exudate.  Eyes: Conjunctivae and EOM are normal. No scleral icterus.  Cardiovascular: Normal rate and regular rhythm. Holosystolic murmur.  Respiratory: Effort normal and breath sounds normal. No stridor. He exhibits no tenderness.  GI: Soft. Bowel sounds are normal. He exhibits no distension. There is no tenderness.  Musculoskeletal: He exhibits edema (right ankle with trace edema). He exhibits no tenderness.  Neurological: He is alert and oriented to person, place, and time.  Right facial weakness with minimal dysarthria.  Able to follow commands without difficulty.  Sensation diminished to light touch RUE/RLE (improving) Motor: LUE/LLE: Antigravity strength  RUE: 4/5 RLE: hip flexion 2+/5, ankle dorsi/plantar flexion 2/5  Skin: Skin is warm  and dry. No rash noted.  Psychiatric: He has a normal mood and affect. His behavior is normal. Judgment and thought content normal.    Assessment/Plan: 1. 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 Physiatrist is providing close team supervision and 24 hour management of active medical problems listed below. Physiatrist and rehab team continue to assess barriers to discharge/monitor patient progress toward functional and medical goals.  Function:  Bathing Bathing position   Position: Shower  Bathing parts Body parts bathed by patient: Right arm, Right upper leg, Left arm, Left upper leg, Chest, Abdomen, Front perineal area, Left lower leg, Buttocks, Back, Right lower leg Body parts bathed by helper: Right lower leg  Bathing assist Assist Level: More than reasonable time      Upper Body Dressing/Undressing Upper body dressing   What is the patient wearing?: Pull over shirt/dress     Pull over shirt/dress - Perfomed by patient: Thread/unthread right sleeve, Thread/unthread left sleeve, Put head through opening, Pull shirt over trunk          Upper body assist Assist Level: Set up   Set up : To obtain clothing/put away  Lower Body Dressing/Undressing Lower body dressing   What is the patient wearing?: Pants, Non-skid slipper socks, Ted Hose Underwear - Performed by patient: Thread/unthread right underwear leg, Thread/unthread left underwear leg, Pull underwear up/down   Pants- Performed by patient: Thread/unthread left pants leg, Thread/unthread right pants leg,  Pull pants up/down Pants- Performed by helper: Thread/unthread right pants leg Non-skid slipper socks- Performed by patient: Don/doff right sock, Don/doff left sock   Socks - Performed by patient: Don/doff right sock, Don/doff left sock   Shoes - Performed by patient: Don/doff left shoe, Fasten  right, Fasten left Shoes - Performed by helper: Don/doff right shoe       TED Hose - Performed by helper: Don/doff right TED hose, Don/doff left TED hose  Lower body assist Assist for lower body dressing: Touching or steadying assistance (Pt > 75%)      Toileting Toileting   Toileting steps completed by patient: Adjust clothing prior to toileting, Performs perineal hygiene, Adjust clothing after toileting   Toileting Assistive Devices: Grab bar or rail  Toileting assist Assist level: Supervision or verbal cues   Transfers Chair/bed transfer   Chair/bed transfer method: Stand pivot, Ambulatory Chair/bed transfer assist level: Touching or steadying assistance (Pt > 75%) Chair/bed transfer assistive device: Medical sales representative     Max distance: 175' Assist level: Supervision or verbal cues   Wheelchair   Type: Manual Max wheelchair distance: 175 Assist Level: Supervision or verbal cues  Cognition Comprehension Comprehension assist level: Follows basic conversation/direction with no assist  Expression Expression assist level: Expresses basic needs/ideas: With no assist  Social Interaction Social Interaction assist level: Interacts appropriately with others - No medications needed.  Problem Solving Problem solving assist level: Solves basic problems with no assist  Memory Memory assist level: Recognizes or recalls 90% of the time/requires cueing < 10% of the time    Medical Problem List and Plan: 1. 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-discussed AFO with PT Cont to modify secondary risk factors 2. DVT Prophylaxis/Anticoagulation: Mechanical: Sequential compression devices, below knee Bilateral lower extremities, Pharmaceutical: Lovenox 3. Pain Management: Tylenol and Norco PRN- no current pain c/os.  4. Mood: LCSW to follow for evaluation and support 5. Neuropsych: This patient is capable of making decisions on his own  behalf. 6. Skin/Wound Care: Maintain adequate nutrition and hydration status. Routine pressure relief measures.  7. Fluids/Electrolytes/Nutrition: Monitor I/O.Fluid 670ml yesterday              Protein supplement added  8. HTN Cont, Imdur , Lasix Monitor VS during therapies and at rest and adjust as indicated to ensure goal BP maintained              Toprol increased to 25 on 12/31-HR still 80-90, Sys BP still elevated               Will increase Toprol dose in am if still elevated         9. DM HbA1c 6.8 on 12/22 FSBS trend:  CBG (last 3)   Recent Labs  12/24/15 1640 12/24/15 2123 12/25/15 0704  GLUCAP 94 111* 104*   no med adjustment  10. CAD s/p recent CABG-on Imdur Monitor in accordance with increased physical activity and avoid UE resistance excercises 11. OSA Cont O2 at night Evaluate signs of daytime somnolence. 12. AS See CAD 13. Anemia Hb 8.4 on 12/24  Will periodically monitor Hb to ensure adequate perfusion and energy for therapies Will transfuse if pt becomes symptomatic and/or Hb <7.0 14. Spastic bladder Cont Ditropan  15. GERD Cont Protonix 16. Hypokalemia: Normalized after supplementation 6meq K+ supplement 3.7 pulse is 30 CR 1.31 on 12/30, Lasix reduced. Will continue to monitor and encourage by mouth intake 17. COPD Cont Spiriva- currently  asymptomatic 18.  Constipation-senna reduced on 12/30        LOS (Days) 10 A FACE TO FACE EVALUATION WAS PERFORMED  Alysia Penna E 12/25/2015 8:05 AM

## 2015-12-26 ENCOUNTER — Inpatient Hospital Stay (HOSPITAL_COMMUNITY): Payer: Medicare Other | Admitting: Physical Therapy

## 2015-12-26 ENCOUNTER — Inpatient Hospital Stay (HOSPITAL_COMMUNITY): Payer: Medicare Other

## 2015-12-26 LAB — GLUCOSE, CAPILLARY
GLUCOSE-CAPILLARY: 101 mg/dL — AB (ref 65–99)
GLUCOSE-CAPILLARY: 128 mg/dL — AB (ref 65–99)
GLUCOSE-CAPILLARY: 114 mg/dL — AB (ref 65–99)
Glucose-Capillary: 101 mg/dL — ABNORMAL HIGH (ref 65–99)

## 2015-12-26 LAB — CBC
HCT: 29.3 % — ABNORMAL LOW (ref 39.0–52.0)
HEMOGLOBIN: 9.4 g/dL — AB (ref 13.0–17.0)
MCH: 28.6 pg (ref 26.0–34.0)
MCHC: 32.1 g/dL (ref 30.0–36.0)
MCV: 89.1 fL (ref 78.0–100.0)
Platelets: 322 10*3/uL (ref 150–400)
RBC: 3.29 MIL/uL — AB (ref 4.22–5.81)
RDW: 18.2 % — ABNORMAL HIGH (ref 11.5–15.5)
WBC: 3.2 10*3/uL — AB (ref 4.0–10.5)

## 2015-12-26 LAB — BASIC METABOLIC PANEL
ANION GAP: 8 (ref 5–15)
BUN: 12 mg/dL (ref 6–20)
CALCIUM: 9.5 mg/dL (ref 8.9–10.3)
CHLORIDE: 111 mmol/L (ref 101–111)
CO2: 20 mmol/L — AB (ref 22–32)
Creatinine, Ser: 1.3 mg/dL — ABNORMAL HIGH (ref 0.61–1.24)
GFR calc non Af Amer: 48 mL/min — ABNORMAL LOW (ref >60)
GFR, EST AFRICAN AMERICAN: 56 mL/min — AB (ref >60)
Glucose, Bld: 112 mg/dL — ABNORMAL HIGH (ref 65–99)
Potassium: 3.9 mmol/L (ref 3.5–5.1)
SODIUM: 139 mmol/L (ref 135–145)

## 2015-12-26 MED ORDER — METOPROLOL SUCCINATE ER 50 MG PO TB24
50.0000 mg | ORAL_TABLET | Freq: Every day | ORAL | Status: DC
  Administered 2015-12-26 – 2015-12-29 (×4): 50 mg via ORAL
  Filled 2015-12-26 (×4): qty 1

## 2015-12-26 MED ORDER — SENNOSIDES-DOCUSATE SODIUM 8.6-50 MG PO TABS: 1.0000 | ORAL_TABLET | Freq: Every evening | ORAL | Status: DC | PRN

## 2015-12-26 NOTE — Progress Notes (Signed)
Physical Therapy Session Note  Patient Details  Name: Douglas Carrillo MRN: [id number] Date of Birth: 1928/04/02  Today's Date: 12/26/2015 PT Individual Time: 0800-0900 and 1100-1200 PT Individual Time Calculation (min): 60 min and 60 min (total 120 min)   Short Term Goals: Week 2:  PT Short Term Goal 1 (Week 2): = LTG due to estimated length of stay  Skilled Therapeutic Interventions/Progress Updates:    Tx 1: Pt received seated in recliner, no c/o pain and agreeable to treatment. Performed lower body dressing including pants, TEDs, and shoes with S and increased time due to decreased coordination, endurance. Educated pt in donning foot-up AFO and allowed pt through trial and error to don brace, however required mod verbal cues for alignment, sequencing, and assist for clip due to decreased coordination and fine motor control. Gait with RW x150' to gym with close S, occasional facilitation at R glutes and verbal cues for hip extension and upright posture. Gait 30'x2 with large base quad cane; pt has difficulty maintaining proper sequencing, states his [employer] training has taught him to lead with L foot, however needs to lead with RLE. Kinetron x3 trials of 30 alternating steps followed by 1 min hold on RLE stance. Sit <>stand x10 with LLE on 3" step to facilitate R weight shift and forced use. Gait to return to room with RW and S. Remained seated in recliner with all needs in reach at completion of session.  Tx 2: Pt received seated in recliner, no c/o pain and agreeable to treatment. Gait to apartment with RW and S x175', min verbal cues for RLE progression and stance control. Floor transfer x2 trials with S initial trial, mod I second trial. Transfer from couch with S. Car transfer with RW and S. Gait on treadmill x2 trials for 5 min with BUE support on handrails with seated rest breaks; mod tactile cues for hip extension in stance and verbal cues for knee/hip flexion during swing phase. Gait with RW  to return to room with notable fatigue and increased RLE foot drag. Educated pt in energy conservation, increased falls risk with fatigue and discussed strategies for managing fatigue, rest breaks when needed. Remained seated in recliner with all needs in reach at completion of session.   Therapy Documentation Precautions:  Precautions Precautions: Fall Restrictions Weight Bearing Restrictions: No Pain: Pain Assessment Pain Assessment: No/denies pain Pain Score: 0-No pain   See Function Navigator for Current Functional Status.   Therapy/Group: Individual Therapy  Luberta Mutter 12/26/2015, 8:51 AM

## 2015-12-26 NOTE — Progress Notes (Signed)
[facility] PHYSICAL MEDICINE & REHABILITATION     PROGRESS NOTE    Subjective/Complaints:  Loose incont stool  ROS: Denies CP, SOB, N/V/D.no bladder issues  Objective: Vital Signs: Blood pressure 146/73, pulse 90, temperature 97.7 F (36.5 C), temperature source Oral, resp. rate 18, weight 92.6 kg (204 lb 2.3 oz), SpO2 100 %. No results found. No results for input(s): WBC, HGB, HCT, PLT in the last 72 hours. No results for input(s): NA, K, CL, GLUCOSE, BUN, CREATININE, CALCIUM in the last 72 hours.  Invalid input(s): CO CBG (last 3)   Recent Labs  12/25/15 1158 12/25/15 1633 12/25/15 2110  GLUCAP 110* 97 138*    Wt Readings from Last 3 Encounters:  12/22/15 92.6 kg (204 lb 2.3 oz)  12/13/15 92.987 kg (205 lb)  12/12/15 92.987 kg (205 lb)    Physical Exam:  BP 146/73 mmHg  Pulse 90  Temp(Src) 97.7 F (36.5 C) (Oral)  Resp 18  Wt 92.6 kg (204 lb 2.3 oz)  SpO2 100% Constitutional: He appears well-developed and well-nourished. NAD. Vital signs reviewed.  Appears younger than stated age.  HENT: Normocephalic and atraumatic.  Mouth/Throat: No oropharyngeal exudate.  Eyes: Conjunctivae and EOM are normal. No scleral icterus.  Cardiovascular: Normal rate and regular rhythm. Holosystolic murmur.  Respiratory: Effort normal and breath sounds normal. No stridor. He exhibits no tenderness.  GI: Soft. Bowel sounds are normal. He exhibits no distension. There is no tenderness.  Musculoskeletal: He exhibits edema (right ankle with trace edema). He exhibits no tenderness.  Neurological: He is alert and oriented to person, place, and time.  Right facial weakness with minimal dysarthria.  Able to follow commands without difficulty.  Sensation diminished to light touch RUE/RLE (improving) Motor: LUE/LLE: Antigravity strength  RUE: 4/5 RLE: hip flexion 2+/5, ankle dorsi/plantar flexion 2/5  Skin: Skin is warm and dry. No rash noted.  Psychiatric: He has a normal  mood and affect. His behavior is normal. Judgment and thought content normal.    Assessment/Plan: 1. 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 Physiatrist is providing close team supervision and 24 hour management of active medical problems listed below. Physiatrist and rehab team continue to assess barriers to discharge/monitor patient progress toward functional and medical goals.  Function:  Bathing Bathing position   Position: Shower  Bathing parts Body parts bathed by patient: Right arm, Right upper leg, Left arm, Left upper leg, Chest, Abdomen, Front perineal area, Left lower leg, Buttocks, Back, Right lower leg Body parts bathed by helper: Right lower leg  Bathing assist Assist Level: More than reasonable time      Upper Body Dressing/Undressing Upper body dressing   What is the patient wearing?: Pull over shirt/dress     Pull over shirt/dress - Perfomed by patient: Thread/unthread right sleeve, Thread/unthread left sleeve, Put head through opening, Pull shirt over trunk          Upper body assist Assist Level: Set up   Set up : To obtain clothing/put away  Lower Body Dressing/Undressing Lower body dressing   What is the patient wearing?: Pants, Shoes, [employer], Underwear Underwear - Performed by patient: Thread/unthread right underwear leg, Thread/unthread left underwear leg, Pull underwear up/down   Pants- Performed by patient: Thread/unthread left pants leg, Thread/unthread right pants leg, Pull pants up/down Pants- Performed by helper: Thread/unthread right pants leg Non-skid slipper socks- Performed by patient: Don/doff right sock,  Don/doff left sock   Socks - Performed by patient: Don/doff right sock, Don/doff left sock   Shoes - Performed by patient: Don/doff left shoe, Fasten right, Fasten left, Don/doff right shoe Shoes - Performed by  helper: Don/doff right shoe     TED Hose - Performed by patient: Don/doff right TED hose, Don/doff left TED hose TED Hose - Performed by helper: Don/doff right TED hose, Don/doff left TED hose  Lower body assist Assist for lower body dressing: Supervision or verbal cues      Toileting Toileting   Toileting steps completed by patient: Adjust clothing prior to toileting, Performs perineal hygiene, Adjust clothing after toileting   Toileting Assistive Devices: Grab bar or rail  Toileting assist Assist level: Supervision or verbal cues   Transfers Chair/bed transfer   Chair/bed transfer method: Stand pivot, Ambulatory Chair/bed transfer assist level: Supervision or verbal cues Chair/bed transfer assistive device: Medical sales representative     Max distance: 175 Assist level: Supervision or verbal cues   Wheelchair   Type: Manual Max wheelchair distance: 175 Assist Level: Supervision or verbal cues  Cognition Comprehension Comprehension assist level: Understands complex 90% of the time/cues 10% of the time  Expression Expression assist level: Expresses basic needs/ideas: With no assist  Social Interaction Social Interaction assist level: Interacts appropriately with others - No medications needed.  Problem Solving Problem solving assist level: Solves basic problems with no assist  Memory Memory assist level: Recognizes or recalls 90% of the time/requires cueing < 10% of the time    Medical Problem List and Plan: 1. 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 DVT Prophylaxis/Anticoagulation: Mechanical: Sequential compression devices, below knee Bilateral lower extremities, Pharmaceutical: Lovenox 3. Pain Management: Tylenol and Norco PRN-  4. Mood: LCSW to follow for evaluation and support 5. Neuropsych: This patient is capable of making decisions on his own behalf. 6. Skin/Wound Care: Maintain  adequate nutrition and hydration status. Routine pressure relief measures.  7. Fluids/Electrolytes/Nutrition: Monitor I/O.Fluid 650ml yesterday              Protein supplement added  8. HTN Cont, Imdur , Lasix Monitor VS during therapies and at rest and adjust as indicated to ensure goal BP maintained              Toprol increased to 25 on 12/31-HR still 80-90, Sys BP still elevated               Will increase Toprol dose          9. DM HbA1c 6.8 on 12/22 FSBS trend:  CBG (last 3)   Recent Labs  12/25/15 1158 12/25/15 1633 12/25/15 2110  GLUCAP 110* 97 138*   AM CBG today 101- pt will f/u with Endo-Dr Altheimer post D/C  10. CAD s/p recent CABG-on Imdur Monitor in accordance with increased physical activity and avoid UE resistance excercises 11. OSA Cont O2 at night Evaluate signs of daytime somnolence. 12. AS See CAD 13. Anemia Hb 8.4 on 12/24 - recheck CBC Will periodically monitor Hb to ensure adequate perfusion and energy for therapies Will transfuse if pt becomes symptomatic and/or Hb <7.0 14. Spastic bladder Cont Ditropan  15. GERD Cont Protonix 16. Hypokalemia: Normalized after supplementation 68meq K+ supplement- Recheck BMET  17. COPD Cont Spiriva- currently asymptomatic 18.  Constipation-D/C senna        LOS (Days) 11 A FACE TO FACE EVALUATION WAS PERFORMED  Charlett Blake 12/26/2015 6:46 AM

## 2015-12-26 NOTE — Progress Notes (Signed)
Occupational Therapy Note  Patient Details  Name: Douglas Carrillo MRN: [id number] Date of Birth: 06-May-1928  Today's Date: 12/26/2015 OT Individual Time: 1300-1330 OT Individual Time Calculation (min): 30 min   Pt denied pain Individual Therapy  Pt resting in recliner upon arrival and amb with RW to therapy gym.  Pt noted with increased difficulty lifting RLE with fatigue, requiring min verbal cues for safety.  Pt engaged in dynamic standing tasks and functional amb with RW to gather towels from floor, fold, and place in dirty linen bag.  Pt also engaged in standing task while operating [employer] for 4 min before returning to room.  Pt remained in recliner with all needs within reach.    Leotis Shames [place] [place] 12/26/2015, 2:08 PM

## 2015-12-26 NOTE — Progress Notes (Signed)
Occupational Therapy Session Note  Patient Details  Name: Douglas Carrillo MRN: [id number] Date of Birth: [birth year]  Today's Date: 12/26/2015 OT Individual Time: 0900-1000 OT Individual Time Calculation (min): 60 min    Short Term Goals: Week 2:  OT Short Term Goal 1 (Week 2): STG = LTGs due to remaining LOS  Skilled Therapeutic Interventions/Progress Updates:    Pt resting in recliner upon arrival.  Pt declined bathing and dressing this morning, preferring to complete tasks later in the day.  Pt amb with RW to therapy gym and initially engaged in dynamic standing tasks retrieving clothes pins from container and placing them on vertical dowel in right upper quadrant.  Pt removed clothes pins after short rest.  Pt amb with RW to small gym and engaged in continued dynamic standing tasks with [employer] in all four quadrants (4 activities X 1 min and 1 activity X 4 mins) using RUE and LUE.  Pt noted with slightly delayed reaction time with RUE (.06 seconds). Pt returned to room and completed brushing teeth while standing at sink.  Pt returned to recliner and requested to eat breakfast.  Focus on activity tolerance, sit<>stand, dynamic standing balance, increased RUE use for functional tasks, and safety awareness.  Therapy Documentation Precautions:  Precautions Precautions: Fall Restrictions Weight Bearing Restrictions: No  Pain: Pain Assessment Pain Assessment: No/denies pain Pain Score: 0-No pain  See Function Navigator for Current Functional Status.   Therapy/Group: Individual Therapy  Leroy Libman 12/26/2015, 10:16 AM

## 2015-12-27 ENCOUNTER — Inpatient Hospital Stay (HOSPITAL_COMMUNITY): Payer: Medicare Other | Admitting: Occupational Therapy

## 2015-12-27 ENCOUNTER — Other Ambulatory Visit: Payer: Self-pay | Admitting: Internal Medicine

## 2015-12-27 ENCOUNTER — Other Ambulatory Visit: Payer: Self-pay | Admitting: Cardiovascular Disease

## 2015-12-27 ENCOUNTER — Inpatient Hospital Stay (HOSPITAL_COMMUNITY): Payer: Medicare Other | Admitting: Physical Therapy

## 2015-12-27 ENCOUNTER — Inpatient Hospital Stay (HOSPITAL_COMMUNITY): Payer: Medicare Other | Admitting: *Deleted

## 2015-12-27 LAB — GLUCOSE, CAPILLARY
GLUCOSE-CAPILLARY: 117 mg/dL — AB (ref 65–99)
Glucose-Capillary: 121 mg/dL — ABNORMAL HIGH (ref 65–99)
Glucose-Capillary: 107 mg/dL — ABNORMAL HIGH (ref 65–99)
Glucose-Capillary: 121 mg/dL — ABNORMAL HIGH (ref 65–99)

## 2015-12-27 NOTE — Patient Care Conference (Signed)
Inpatient RehabilitationTeam Conference and Plan of Care Update Date: 12/27/2015   Time: 12:15 PM    Patient Name: Douglas Carrillo      Medical Record Number: [id number]  Date of Birth: 24-Sep-1928 Sex: Male         Room/Bed: 4W08C/4W08C-01 Payor Info: Payor: MEDICARE / Plan: MEDICARE PART A AND B / Product Type: *No Product type* /    Admitting Diagnosis: L CVA  Admit Date/Time:  12/15/2015  5:44 PM Admission Comments: No comment available   Primary Diagnosis:  <principal problem not specified> Principal Problem: <principal problem not specified>  Patient Active Problem List   Diagnosis Date Noted  . Stroke, acute, thrombotic ([place]) 12/15/2015  . Hemiparesis affecting dominant side as late effect of stroke ([place]) 12/15/2015  . Anemia 12/15/2015  . Spastic neurogenic bladder 12/15/2015  . GERD (gastroesophageal reflux disease) 12/15/2015  . Hypokalemia 12/15/2015  . Cerebral infarction due to unspecified mechanism   . CVA (cerebral infarction) 12/13/2015  . Prolonged Q-T interval on ECG 12/13/2015  . Right sided weakness 12/13/2015  . Elevated troponin 12/13/2015  . Acute kidney failure ([place]) 12/13/2015  . Normocytic anemia 12/13/2015  . Diabetes mellitus with complication ([place])   . CAD in native artery   . Aortic stenosis   . OSA (obstructive sleep apnea) 02/08/2015  . COPD, moderate ([place]) 01/12/2015  . COPD (chronic obstructive pulmonary disease) ([place]) 11/10/2014  . Nocturnal hypoxia 11/10/2014  . Dyspnea and respiratory abnormality 10/19/2014  . Aortic valve stenosis 05/27/2014  . CAD (coronary artery disease) of artery bypass graft 11/30/2013  . CAD (coronary artery disease) 11/30/2013  . Hyperlipidemia with target LDL less than 70 11/30/2013  . Essential hypertension 11/30/2013  . DM2 (diabetes mellitus, type 2) ([place]) 11/30/2013  . RBBB 11/30/2013    Expected Discharge Date: Expected Discharge Date: 12/29/15  Team Members Present: Physician leading conference: Dr.  Alysia Penna Social Worker Present: Ovidio Kin, LCSW Nurse Present: Heather Roberts, RN PT Present: Kem Parkinson, PT OT Present: [name], OT SLP Present: Windell Moulding, SLP PPS Coordinator present : Daiva Nakayama, RN, CRRN     Current Status/Progress Goal Weekly Team Focus  Medical   medically stable, laxatives adjusted  home with family  D/C planning   Bowel/Bladder   continent of bowel and bladder  Pt to remain continent of bowel and bladder manage min assist  assess bowel pattern. continue POC   Swallow/Nutrition/ Hydration     na        ADL's   Mod I bathing and UB dressing, supervision/setup LB dressing and supervision transfers  Mod I ovearll, supervision shower transfers  self-care retraining, dynamic standing balance, RUE NMR   Mobility   S transfers, gait, stairs  mod I bed mobility, transfers, S gait and stairs  gait training, RLE NMR, education and HEP in prep for d/c   Communication     na        Safety/Cognition/ Behavioral Observations    no unsafe behaviors        Pain   no complaints of pain  <3  assess pain q shift and medicate as needed   Skin   skin CDI            *See Care Plan and progress notes for long and short-term goals.  Barriers to Discharge: balance disorder due to CVA    Possible Resolutions to Barriers:  cont D/C teaching    Discharge Planning/Teaching Needs:  Pt doing well in therapies, daughter  to stay with for  short tinme to make sure mod/i at home. She has been in for education      Team Discussion:  Team upgrading his goals-mod/i except for stairs and tub transfers. Medically stable with blood sugars more controlled. Outing today with therapy team. Daughter to stay with short time at home.   Revisions to Treatment Plan:  Upgrading goals   Continued Need for Acute Rehabilitation Level of Care: The patient requires daily medical management by a physician with specialized training in physical medicine and rehabilitation  for the following conditions: Daily direction of a multidisciplinary physical rehabilitation program to ensure safe treatment while eliciting the highest outcome that is of practical value to the patient.: Yes Daily medical management of patient stability for increased activity during participation in an intensive rehabilitation regime.: Yes Daily analysis of laboratory values and/or radiology reports with any subsequent need for medication adjustment of medical intervention for : Pulmonary problems;Neurological problems;Cardiac problems  Elease Hashimoto 12/27/2015, 12:15 PM

## 2015-12-27 NOTE — Progress Notes (Signed)
Occupational Therapy Session Note  Patient Details  Name: Spero Wehe MRN: [id number] Date of Birth: Dec 15, 1928  Today's Date: 12/27/2015 OT Individual Time: 0845-1000 OT Individual Time Calculation (min): 75 min    Short Term Goals: Week 2:  OT Short Term Goal 1 (Week 2): STG = LTGs due to remaining LOS  Skilled Therapeutic Interventions/Progress Updates:    Treatment session with focus on ADL retraining and functional use of dominant RUE.  Discussed current goals and recommendation for supervision with shower transfers and meal prep, but Mod I overall for self-care tasks with pt verbalizing understanding.  Pt able to complete all bathing and dressing at overall Mod I, with exception of donning Rt AFO.  Addressed difficulty with donning foot up orthotic with front clasp, attempted use of step stool to increase angle of foot as well as keeping the brace fastened and then wrapping it around ankle.  Pt with most success with keeping clasp fastened and wrapping it around ankle second.  Ambulated to ADL apt, completed tub/shower transfers with tub bench and discussed recommendation for tub transfer bench.  Engaged in fine motor task in standing with focus on fine motor control and stereognosis with obtaining appropriate items with decreased vision.  Discussed planned community outing and OT goals. Passed off to PT.  Therapy Documentation Precautions:  Precautions Precautions: Fall Restrictions Weight Bearing Restrictions: No Pain: Pain Assessment Pain Assessment: No/denies pain  See Function Navigator for Current Functional Status.   Therapy/Group: Individual Therapy  [name] 12/27/2015, 10:35 AM

## 2015-12-27 NOTE — Progress Notes (Signed)
[facility] PHYSICAL MEDICINE & REHABILITATION     PROGRESS NOTE    Subjective/Complaints:  No stool since 1/2 Slept well  ROS: Denies CP, SOB, N/V/D.no bladder issues  Objective: Vital Signs: Blood pressure 156/78, pulse 93, temperature 98.4 F (36.9 C), temperature source Oral, resp. rate 18, weight 92.6 kg (204 lb 2.3 oz), SpO2 97 %. No results found.  Recent Labs  12/26/15 1015  WBC 3.2*  HGB 9.4*  HCT 29.3*  PLT 322    Recent Labs  12/26/15 1015  NA 139  K 3.9  CL 111  GLUCOSE 112*  BUN 12  CREATININE 1.30*  CALCIUM 9.5   CBG (last 3)   Recent Labs  12/26/15 1654 12/26/15 2120 12/27/15 0707  GLUCAP 128* 114* 121*    Wt Readings from Last 3 Encounters:  12/22/15 92.6 kg (204 lb 2.3 oz)  12/13/15 92.987 kg (205 lb)  12/12/15 92.987 kg (205 lb)    Physical Exam:  BP 156/78 mmHg  Pulse 93  Temp(Src) 98.4 F (36.9 C) (Oral)  Resp 18  Wt 92.6 kg (204 lb 2.3 oz)  SpO2 97% Constitutional: He appears well-developed and well-nourished. NAD. Vital signs reviewed.  Appears younger than stated age.  HENT: Normocephalic and atraumatic.  Mouth/Throat: No oropharyngeal exudate.  Eyes: Conjunctivae and EOM are normal. No scleral icterus.  Cardiovascular: Normal rate and regular rhythm. Holosystolic murmur.  Respiratory: Effort normal and breath sounds normal. No stridor. He exhibits no tenderness.  GI: Soft. Bowel sounds are normal. He exhibits no distension. There is no tenderness.  Musculoskeletal: He exhibits edema (right ankle with trace edema). He exhibits no tenderness.  Neurological: He is alert and oriented to person, place, and time.  Right facial weakness with minimal dysarthria.  Able to follow commands without difficulty.  Sensation diminished to light touch RUE/RLE (improving) Motor: LUE/LLE: Antigravity strength  RUE: 4/5 RLE: hip flexion 2+/5, ankle dorsi/plantar flexion 2/5  Skin: Skin is warm and dry. No rash noted.   Psychiatric: He has a normal mood and affect. His behavior is normal. Judgment and thought content normal.    Assessment/Plan: 1. 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 Physiatrist is providing close team supervision and 24 hour management of active medical problems listed below. Physiatrist and rehab team continue to assess barriers to discharge/monitor patient progress toward functional and medical goals.  Function:  Bathing Bathing position   Position: Shower  Bathing parts Body parts bathed by patient: Right arm, Right upper leg, Left arm, Left upper leg, Chest, Abdomen, Front perineal area, Left lower leg, Buttocks, Back, Right lower leg Body parts bathed by helper: Right lower leg  Bathing assist Assist Level: More than reasonable time      Upper Body Dressing/Undressing Upper body dressing   What is the patient wearing?: Pull over shirt/dress     Pull over shirt/dress - Perfomed by patient: Thread/unthread right sleeve, Thread/unthread left sleeve, Put head through opening, Pull shirt over trunk          Upper body assist Assist Level: Set up   Set up : To obtain clothing/put away  Lower Body Dressing/Undressing Lower body dressing   What is the patient wearing?: Pants, Shoes, [employer], Underwear Underwear - Performed by patient: Thread/unthread right underwear leg, Thread/unthread left underwear leg, Pull underwear up/down   Pants- Performed by patient: Thread/unthread left pants leg, Thread/unthread right pants leg, Pull  pants up/down Pants- Performed by helper: Thread/unthread right pants leg Non-skid slipper socks- Performed by patient: Don/doff right sock, Don/doff left sock   Socks - Performed by patient: Don/doff right sock, Don/doff left sock   Shoes - Performed by patient: Don/doff left shoe, Fasten right, Fasten left, Don/doff  right shoe Shoes - Performed by helper: Don/doff right shoe     TED Hose - Performed by patient: Don/doff right TED hose, Don/doff left TED hose TED Hose - Performed by helper: Don/doff right TED hose, Don/doff left TED hose  Lower body assist Assist for lower body dressing: Supervision or verbal cues      Toileting Toileting   Toileting steps completed by patient: Adjust clothing prior to toileting, Performs perineal hygiene, Adjust clothing after toileting   Toileting Assistive Devices: Grab bar or rail  Toileting assist Assist level: More than reasonable time   Transfers Chair/bed transfer   Chair/bed transfer method: Stand pivot, Ambulatory Chair/bed transfer assist level: Supervision or verbal cues Chair/bed transfer assistive device: Medical sales representative     Max distance: 302 Assist level: Supervision or verbal cues   Wheelchair   Type: Manual Max wheelchair distance: 175 Assist Level: Supervision or verbal cues  Cognition Comprehension Comprehension assist level: Understands complex 90% of the time/cues 10% of the time  Expression Expression assist level: Expresses complex 90% of the time/cues < 10% of the time  Social Interaction Social Interaction assist level: Interacts appropriately with others with medication or extra time (anti-anxiety, antidepressant).  Problem Solving Problem solving assist level: Solves complex 90% of the time/cues < 10% of the time  Memory Memory assist level: More than reasonable amount of time    Medical Problem List and Plan: 1. 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-Team conference today please see physician documentation under team conference tab, met with team face-to-face to discuss problems,progress, and goals. Formulized individual treatment plan based on medical history, underlying problem and comorbidities. Cont to modify secondary risk factors 2. DVT Prophylaxis/Anticoagulation:  Mechanical: Sequential compression devices, below knee Bilateral lower extremities, Pharmaceutical: Lovenox 3. Pain Management: Tylenol and Norco PRN-  4. Mood: LCSW to follow for evaluation and support 5. Neuropsych: This patient is capable of making decisions on his own behalf. 6. Skin/Wound Care: Maintain adequate nutrition and hydration status. Routine pressure relief measures.  7. Fluids/Electrolytes/Nutrition: Monitor I/O.Fluid 629m yesterday              Protein supplement added  8. HTN Cont, Imdur , Lasix Monitor VS during therapies and at rest and adjust as indicated to ensure goal BP maintained              Toprol increased to 25 on 12/31-HR still 80-90, Sys BP still elevated               Will increase Toprol dose          9. DM HbA1c 6.8 on 12/22 FSBS trend:  CBG (last 3)   Recent Labs  12/26/15 1654 12/26/15 2120 12/27/15 0707  GLUCAP 128* 114* 121*   AM CBG today 101- pt will f/u with Endo-Dr Altheimer post D/C  10. CAD s/p recent CABG-on Imdur Monitor in accordance with increased physical activity and avoid UE resistance excercises 11. OSA Cont O2 at night Evaluate signs of daytime somnolence. 12. AS See CAD 13. Anemia Hb 8.4 on 12/24 - recheck CBC 9.4 on 1/3 PCP f/u  14. Spastic bladder Cont Ditropan  15. GERD  Cont Protonix 16. Hypokalemia: Normalized after supplementation 88mq K+ supplement- Recheck BMET- K+ 3.9  17. COPD Cont Spiriva- currently asymptomatic 18.  Constipation-D/C senna due to loose stool, now no BM since 1/2,?colace       LOS (Days) 12 A FACE TO FACE EVALUATION WAS PERFORMED  KCharlett Blake1/03/2016 7:31 AM

## 2015-12-27 NOTE — Telephone Encounter (Signed)
Rx request sent to pharmacy.  

## 2015-12-27 NOTE — Progress Notes (Signed)
Physical Therapy Session Note  Patient Details  Name: Sidi Cottrill MRN: [id number] Date of Birth: 16-Oct-1928  Today's Date: 12/27/2015 PT Concurrent Time: 1030-1325 PT Concurrent Time Calculation (min): 175 min  Short Term Goals: Week 2:  PT Short Term Goal 1 (Week 2): = LTG due to estimated length of stay  Skilled Therapeutic Interventions/Progress Updates:    Pt participated in community outing with PT and recreational therapist with focus on community access ambulating with RW and RLE orthosis. Required pt to navigate in a variety of environments, including narrow walk ways, around corners, and opening doors to access bathroom. Pt had minor LOB while attempting to manage RW while opening bathroom door, requiring minA to correct. Encouraged RUE functional use and coordintion while performing self-feeding, dressing, opening doors. Pt very fatigued towards completion of session; utilized rest breaks to educate on fatigue management, energy conservation, scheduling activities around times where pt is less fatigued, and allowing time for rest breaks/naps when needed.   Therapy Documentation Precautions:  Precautions Precautions: Fall Restrictions Weight Bearing Restrictions: No Pain: Pain Assessment Pain Assessment: No/denies pain Pain Score: 0-No pain   See Function Navigator for Current Functional Status.   Therapy/Group: Individual Therapy  Luberta Mutter 12/27/2015, 1:53 PM

## 2015-12-27 NOTE — Progress Notes (Signed)
Social Work Elease Hashimoto, LCSW Social Worker Signed Physical Medicine and Rehabilitation Patient Care Conference 12/27/2015 12:14 PM    Expand All Collapse All   Inpatient RehabilitationTeam Conference and Plan of Care Update Date: 12/27/2015   Time: 12:15 PM    Patient Name: Douglas Carrillo       Medical Record Number: [id number]  Date of Birth: Mar 02, 1928 Sex: Male         Room/Bed: 4W08C/4W08C-01 Payor Info: Payor: MEDICARE / Plan: MEDICARE PART A AND B / Product Type: *No Product type* /    Admitting Diagnosis: L CVA   Admit Date/Time:  12/15/2015  5:44 PM Admission Comments: No comment available   Primary Diagnosis:  <principal problem not specified> Principal Problem: <principal problem not specified>    Patient Active Problem List     Diagnosis  Date Noted   .  Stroke, acute, thrombotic ([place])  12/15/2015   .  Hemiparesis affecting dominant side as late effect of stroke ([place])  12/15/2015   .  Anemia  12/15/2015   .  Spastic neurogenic bladder  12/15/2015   .  GERD (gastroesophageal reflux disease)  12/15/2015   .  Hypokalemia  12/15/2015   .  Cerebral infarction due to unspecified mechanism     .  CVA (cerebral infarction)  12/13/2015   .  Prolonged Q-T interval on ECG  12/13/2015   .  Right sided weakness  12/13/2015   .  Elevated troponin  12/13/2015   .  Acute kidney failure ([place])  12/13/2015   .  Normocytic anemia  12/13/2015   .  Diabetes mellitus with complication ([place])     .  CAD in native artery     .  Aortic stenosis     .  OSA (obstructive sleep apnea)  02/08/2015   .  COPD, moderate ([place])  01/12/2015   .  COPD (chronic obstructive pulmonary disease) ([place])  11/10/2014   .  Nocturnal hypoxia  11/10/2014   .  Dyspnea and respiratory abnormality  10/19/2014   .  Aortic valve stenosis  05/27/2014   .  CAD (coronary artery disease) of artery bypass graft  11/30/2013   .  CAD (coronary artery disease)  11/30/2013   .  Hyperlipidemia with target LDL less than 70   11/30/2013   .  Essential hypertension  11/30/2013   .  DM2 (diabetes mellitus, type 2) ([place])  11/30/2013   .  RBBB  11/30/2013     Expected Discharge Date: Expected Discharge Date: 12/29/15  Team Members Present: Physician leading conference: Dr. Alysia Penna Social Worker Present: Ovidio Kin, LCSW Nurse Present: Heather Roberts, RN PT Present: Kem Parkinson, PT OT Present: [name], OT SLP Present: Windell Moulding, SLP PPS Coordinator present : Daiva Nakayama, RN, CRRN        Current Status/Progress  Goal  Weekly Team Focus   Medical     medically stable, laxatives adjusted   home with family  D/C planning   Bowel/Bladder     continent of bowel and bladder  Pt to remain continent of bowel and bladder manage min assist   assess bowel pattern. continue POC    Swallow/Nutrition/ Hydration       na         ADL's     Mod I bathing and UB dressing, supervision/setup LB dressing and supervision transfers  Mod I ovearll, supervision shower transfers   self-care retraining, dynamic standing balance, RUE NMR   Mobility  S transfers, gait, stairs  mod I bed mobility, transfers, S gait and stairs   gait training, RLE NMR, education and HEP in prep for d/c   Communication       na         Safety/Cognition/ Behavioral Observations      no unsafe behaviors         Pain     no complaints of pain  <3  assess pain q shift and medicate as needed    Skin     skin CDI            *See Care Plan and progress notes for long and short-term goals.    Barriers to Discharge:  balance disorder due to CVA     Possible Resolutions to Barriers:   cont D/C teaching     Discharge Planning/Teaching Needs:   Pt doing well in therapies, daughter to stay with for  short tinme to make sure mod/i at home. She has been in for education        Team Discussion:    Team upgrading his goals-mod/i except for stairs and tub transfers. Medically stable with blood sugars more controlled. Outing  today with therapy team. Daughter to stay with short time at home.    Revisions to Treatment Plan:    Upgrading goals    Continued Need for Acute Rehabilitation Level of Care: The patient requires daily medical management by a physician with specialized training in physical medicine and rehabilitation for the following conditions: Daily direction of a multidisciplinary physical rehabilitation program to ensure safe treatment while eliciting the highest outcome that is of practical value to the patient.: Yes Daily medical management of patient stability for increased activity during participation in an intensive rehabilitation regime.: Yes Daily analysis of laboratory values and/or radiology reports with any subsequent need for medication adjustment of medical intervention for : Pulmonary problems;Neurological problems;Cardiac problems  Elease Hashimoto 12/27/2015, 12:15 PM                  Patient ID: Douglas Carrillo, male   DOB: 1928/05/20, 82 y.o.   MRN: [id number]

## 2015-12-27 NOTE — Progress Notes (Signed)
Recreational Therapy Session Note  Patient Details  Name: Jeremmy Blaesing MRN: [id number] Date of Birth: 11/14/1928 Today's Date: 12/27/2015  Pain: no c/o Skilled Therapeutic Interventions/Progress Updates: Pt participated in community reintegration/outing to [employer] for lunch at overall close supervision-min assist level.  Goals focused on safe functional mobility using RW on various indoor and outdoor community surfaces, accessing public restroom, energy conservation techniques and discharge planning in regard to leisure/community pursuits.  Pt state appreciation for outing.  See outing goal sheet in shadow chart for full details.  No further TR services.  Therapy/Group: [employer] Dwayna Kentner 12/27/2015, 1:37 PM

## 2015-12-27 NOTE — Plan of Care (Signed)
Problem: RH Balance Goal: LTG Patient will maintain dynamic standing balance (PT) LTG: Patient will maintain dynamic standing balance with assistance during mobility activities (PT)  Upgraded d/t progress     Problem: RH Floor Transfers Goal: LTG Patient will perform floor transfers w/assist (PT) LTG: Patient will perform floor transfers with assistance (PT).  Upgraded d/t progress  Problem: RH Ambulation Goal: LTG Patient will ambulate in home environment (PT) LTG: Patient will ambulate in home environment, # of feet with assistance (PT).  Upgraded d/t progress  Problem: RH Wheelchair Mobility Goal: LTG Patient will propel w/c in controlled environment (PT) LTG: Patient will propel wheelchair in controlled environment, # of feet with assist (PT)  Outcome: Not Applicable Date Met:  25/36/64 D/c due to pt ambulatory

## 2015-12-27 NOTE — Progress Notes (Signed)
Physical Therapy Session Note  Patient Details  Name: Douglas Carrillo MRN: [id number] Date of Birth: 1928-07-29  Today's Date: 12/27/2015 PT Individual Time: 1000-1057 PT Individual Time Calculation (min): 57 min   Short Term Goals: Week 2:  PT Short Term Goal 1 (Week 2): = LTG due to estimated length of stay  Skilled Therapeutic Interventions/Progress Updates:    Pt received seated on edge of mat table with handoff from OT. No c/o pain and agreeable to treatment. Stair training x12 with B rails and S, using step to pattern leading with LLE ascent, RLE descent. 1 trial x8 stairs with RLE lead for strengthening and NMR. 1 trial with alternating pattern, 2 handrails and close S. In stairwell, performed ascent/descent 12 stairs with 1 handrail; minor LOB when descending with LLE due to RLE strength impairments, and pt preference for step-to leading with RLE for remainder of descent. Instructed pt in [place] A and B exercises for strengthening and balance. Ambulation in parallel bars 6x10' with occasional UE support for balance with facilitation at pelvis and R glutes for hip extension and stance control. Improved step symmetry and balance with repetition. Returned to room x150' with RW and S. Remained seated in recliner at completion of session, all needs within reach.   Therapy Documentation Precautions:  Precautions Precautions: Fall Restrictions Weight Bearing Restrictions: No Pain: Pain Assessment Pain Assessment: No/denies pain Pain Score: 0-No pain   See Function Navigator for Current Functional Status.   Therapy/Group: Individual Therapy  Luberta Mutter 12/27/2015, 11:24 AM

## 2015-12-27 NOTE — Progress Notes (Signed)
Social Work Patient ID: Douglas Carrillo, male   DOB: Dec 07, 1928, 82 y.o.   MRN: [id number] Met with pt to discuss team conference progression toward his goals and being updated to mod/i level. He is going on a outing today and is trying to rest up. His daughter plans to stay with him for a  short time  To make sure doing well at home. Home health follow up recommended by therapists to see how doing at home. Pt needs a tub bench will discuss with daughter, before ordering. Pt has rolling walker for home. Will work toward discharge Friday.

## 2015-12-28 ENCOUNTER — Inpatient Hospital Stay (HOSPITAL_COMMUNITY): Payer: Medicare Other | Admitting: Physical Therapy

## 2015-12-28 ENCOUNTER — Inpatient Hospital Stay (HOSPITAL_COMMUNITY): Payer: Medicare Other | Admitting: Occupational Therapy

## 2015-12-28 LAB — GLUCOSE, CAPILLARY
GLUCOSE-CAPILLARY: 133 mg/dL — AB (ref 65–99)
Glucose-Capillary: 111 mg/dL — ABNORMAL HIGH (ref 65–99)
Glucose-Capillary: 90 mg/dL (ref 65–99)
Glucose-Capillary: 111 mg/dL — ABNORMAL HIGH (ref 65–99)

## 2015-12-28 MED ORDER — POTASSIUM CHLORIDE CRYS ER 20 MEQ PO TBCR
EXTENDED_RELEASE_TABLET | ORAL | Status: DC
Start: 2015-12-28 — End: 2015-12-29

## 2015-12-28 MED ORDER — FUROSEMIDE 40 MG PO TABS
40.0000 mg | ORAL_TABLET | Freq: Every day | ORAL | Status: DC
  Administered 2015-12-28 – 2015-12-29 (×2): 40 mg via ORAL
  Filled 2015-12-28 (×2): qty 1

## 2015-12-28 NOTE — Progress Notes (Signed)
Occupational Therapy Session Note  Patient Details  Name: Douglas Carrillo MRN: [id number] Date of Birth: 1928/03/18  Today's Date: 12/28/2015 OT Individual Time: [id number] OT Individual Time Calculation (min): 63 min    Short Term Goals: Week 2:  OT Short Term Goal 1 (Week 2): STG = LTGs due to remaining LOS  Skilled Therapeutic Interventions/Progress Updates:    Completed ADL retraining with pt overall Mod I with self-care tasks of bathing and dressing.  Continue to recommend supervision initially upon d/c home with shower transfer and to have someone in the home when he is bathing, which pt reported understanding.  Pt able to fasten AFO this session, keeping clasp fastened.  Completed 9 hole peg test with Rt: 33 seconds and Lt: 33 seconds, showing improvement from eval.  Pt requesting to attempt stairs with reciprocal stepping pattern with pt able to complete with supervision and use of bilateral rails.  Engaged in dynamic standing balance activity with reaching to obtain items off floor and alternating stepping to challenge balance reactions and alternate weight support.  Pt able to tap foam cone with RLE 10x with improved standing balance, while maintaining UE support on RW.  Reiterated use of theraputty and HEP exercises already provided with pt reporting understanding.  Therapy Documentation Precautions:  Precautions Precautions: Fall Restrictions Weight Bearing Restrictions: No General:   Vital Signs: Oxygen Therapy O2 Device: Not Delivered Pain: Pain Assessment Pain Assessment: No/denies pain Pain Score: 0-No pain  See Function Navigator for Current Functional Status.   Therapy/Group: Individual Therapy  [name] 12/28/2015, 10:55 AM

## 2015-12-28 NOTE — Progress Notes (Signed)
Social Work Patient ID: Douglas Carrillo, male   DOB: 1928-11-30, 82 y.o.   MRN: [id number] Met with pt and daughter who both have decided he would rather do Op therapies at discharge. Will make referral to OP rehab. Also added rolling walker due to pt not sure now if sister in-law has his brother's. Both feel ready for discharge tomorrow. Daughter agreeable to tub bench and the cost.  Pt reports the outing went well.

## 2015-12-28 NOTE — Progress Notes (Signed)
[place] PHYSICAL MEDICINE & REHABILITATION     PROGRESS NOTE    Subjective/Complaints:  No issues overnite, cont mod BM without laxative  ROS: Denies CP, SOB, N/V/D.no bladder issues  Objective: Vital Signs: Blood pressure 166/72, pulse 86, temperature 98.3 F (36.8 C), temperature source Oral, resp. rate 19, weight 92.6 kg (204 lb 2.3 oz), SpO2 96 %. No results found.  Recent Labs  12/26/15 1015  WBC 3.2*  HGB 9.4*  HCT 29.3*  PLT 322    Recent Labs  12/26/15 1015  NA 139  K 3.9  CL 111  GLUCOSE 112*  BUN 12  CREATININE 1.30*  CALCIUM 9.5   CBG (last 3)   Recent Labs  12/27/15 1617 12/27/15 2127 12/28/15 0632  GLUCAP 121* 117* 111*    Wt Readings from Last 3 Encounters:  12/22/15 92.6 kg (204 lb 2.3 oz)  12/13/15 92.987 kg (205 lb)  12/12/15 92.987 kg (205 lb)    Physical Exam:  BP 166/72 mmHg  Pulse 86  Temp(Src) 98.3 F (36.8 C) (Oral)  Resp 19  Wt 92.6 kg (204 lb 2.3 oz)  SpO2 96% Constitutional: He appears well-developed and well-nourished. NAD. Vital signs reviewed.  Appears younger than stated age.  HENT: Normocephalic and atraumatic.  Mouth/Throat: No oropharyngeal exudate.  Eyes: Conjunctivae and EOM are normal. No scleral icterus.  Cardiovascular: Normal rate and regular rhythm. Holosystolic murmur.  Respiratory: Effort normal and breath sounds normal. No stridor. He exhibits no tenderness.  GI: Soft. Bowel sounds are normal. He exhibits no distension. There is no tenderness.  Musculoskeletal: He exhibits edema (right ankle with trace edema). He exhibits no tenderness.  Neurological: He is alert and oriented to person, place, and time.  Right facial weakness with minimal dysarthria.  Able to follow commands without difficulty.  Sensation diminished to light touch RUE/RLE (improving) Motor: LUE/LLE: Antigravity strength  RUE: 4/5 RLE: hip flexion 2+/5, ankle dorsi/plantar flexion 2/5  Skin: Skin is warm and dry. No  rash noted.  Psychiatric: He has a normal mood and affect. His behavior is normal. Judgment and thought content normal.    Assessment/Plan: 1. 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 Physiatrist is providing close team supervision and 24 hour management of active medical problems listed below. Physiatrist and rehab team continue to assess barriers to discharge/monitor patient progress toward functional and medical goals.  Function:  Bathing Bathing position   Position: Shower  Bathing parts Body parts bathed by patient: Right arm, Right upper leg, Left arm, Left upper leg, Chest, Abdomen, Front perineal area, Left lower leg, Buttocks, Back, Right lower leg Body parts bathed by helper: Right lower leg  Bathing assist Assist Level: More than reasonable time      Upper Body Dressing/Undressing Upper body dressing   What is the patient wearing?: Pull over shirt/dress     Pull over shirt/dress - Perfomed by patient: Thread/unthread right sleeve, Thread/unthread left sleeve, Put head through opening, Pull shirt over trunk          Upper body assist Assist Level: More than reasonable time   Set up : To obtain clothing/put away  Lower Body Dressing/Undressing Lower body dressing   What is the patient wearing?: Pants, Shoes, [employer], Underwear, AFO Underwear - Performed by patient: Thread/unthread right underwear leg, Thread/unthread left underwear leg, Pull underwear up/down   Pants- Performed by patient: Thread/unthread left pants leg,  Thread/unthread right pants leg, Pull pants up/down Pants- Performed by helper: Thread/unthread right pants leg Non-skid slipper socks- Performed by patient: Don/doff right sock, Don/doff left sock   Socks - Performed by patient: Don/doff right sock, Don/doff left sock   Shoes - Performed by patient: Don/doff left shoe,  Fasten right, Fasten left, Don/doff right shoe Shoes - Performed by helper: Don/doff right shoe   AFO - Performed by helper: Don/doff right AFO TED Hose - Performed by patient: Don/doff right TED hose, Don/doff left TED hose TED Hose - Performed by helper: Don/doff right TED hose, Don/doff left TED hose  Lower body assist Assist for lower body dressing: Set up      Toileting Toileting   Toileting steps completed by patient: Adjust clothing prior to toileting, Performs perineal hygiene, Adjust clothing after toileting   Toileting Assistive Devices: Grab bar or rail  Toileting assist Assist level: More than reasonable time   Transfers Chair/bed transfer   Chair/bed transfer method: Stand pivot, Ambulatory Chair/bed transfer assist level: Supervision or verbal cues Chair/bed transfer assistive device: Medical sales representative     Max distance: 150 Assist level: Supervision or verbal cues   Wheelchair   Type: Manual Max wheelchair distance: 175 Assist Level: Supervision or verbal cues  Cognition Comprehension Comprehension assist level: Understands complex 90% of the time/cues 10% of the time  Expression Expression assist level: Expresses complex 90% of the time/cues < 10% of the time  Social Interaction Social Interaction assist level: Interacts appropriately with others with medication or extra time (anti-anxiety, antidepressant).  Problem Solving Problem solving assist level: Solves complex 90% of the time/cues < 10% of the time  Memory Memory assist level: More than reasonable amount of time    Medical Problem List and Plan: 1. 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 DVT Prophylaxis/Anticoagulation: Mechanical: Sequential compression devices, below knee Bilateral lower extremities, Pharmaceutical: Lovenox 3. Pain Management: Tylenol and Norco PRN-  4. Mood: LCSW to follow  for evaluation and support 5. Neuropsych: This patient is capable of making decisions on his own behalf. 6. Skin/Wound Care: Maintain adequate nutrition and hydration status. Routine pressure relief measures.  7. Fluids/Electrolytes/Nutrition: Monitor I/O.po intake improving             Protein supplement added  8. HTN Cont, Imdur , Lasix at 20mg - will increase to home dose of 40mg  Monitor VS during therapies and at rest and adjust as indicated to ensure goal BP maintained              166/72- lasix will be increased as above         9. DM HbA1c 6.8 on 12/22 FSBS trend:  CBG (last 3)   Recent Labs  12/27/15 1617 12/27/15 2127 12/28/15 0632  GLUCAP 121* 117* 111*     10. CAD s/p recent CABG-on Imdur Monitor in accordance with increased physical activity and avoid UE resistance excercises 11. OSA Cont O2 at night Evaluate signs of daytime somnolence. 12. AS See CAD 13. Anemia recheck CBC 9.4 on 1/3 PCP f/u  14. Spastic bladder Cont Ditropan  15. GERD Cont Protonix 16. Hypokalemia: Normalized after supplementation 79meq K+ supplement- 1/3 K+ 3.9, will need recheck next wk with HH  17. COPD Cont Spiriva- currently asymptomatic 18.  Constipation-had BM yesterday       LOS (Days) 13 A FACE TO FACE EVALUATION WAS PERFORMED  Charlett Blake  12/28/2015 6:45 AM

## 2015-12-28 NOTE — Progress Notes (Signed)
Physical Therapy Discharge Summary  Patient Details  Name: Douglas Carrillo MRN: [id number] Date of Birth: [birth year]  Today's Date: 12/28/2015 PT Individual Time: 1300-1415 and 0800-0930 PT Individual Time Calculation (min): 75 min and 90 min (total 165 min)   Patient has met 11 of 11 long term goals due to improved activity tolerance, improved balance, improved postural control, increased strength, increased range of motion, ability to compensate for deficits, functional use of  right upper extremity and right lower extremity and improved coordination.  Patient to discharge at an ambulatory level Modified Independent.   Patient to discharge at mod I level and will be home alone for most of the day, however daughter is available to assist PRN, and is able to provide physical support and cueing if necessary, as well as providing supervision on stairs and community ambulation  Reasons goals not met: All goals met  Recommendation:  Patient will benefit from ongoing skilled PT services in outpatient setting to continue to advance safe functional mobility, address ongoing impairments in strength, coordination, balance, activity tolerance, motor control, and minimize fall risk.  Equipment: No equipment provided  Reasons for discharge: treatment goals met  Patient/family agrees with progress made and goals achieved: Yes  PT Discharge Precautions/Restrictions Precautions Precautions: Fall Restrictions Weight Bearing Restrictions: No Pain Pain Assessment Pain Assessment: No/denies pain Pain Score: 0-No pain Vision/Perception    WFL; pt reports no change from baseline vision. No perceptual impairments observed Cognition Overall Cognitive Status: Within Functional Limits for tasks assessed Arousal/Alertness: Awake/alert Orientation Level: Oriented X4 Memory: Appears intact Awareness: Appears intact Problem Solving: Appears intact Safety/Judgment: Appears  intact Sensation Sensation Light Touch: Impaired by gross assessment (Diminished RUE, however per pt report improved over baseline) Stereognosis: Appears Intact Hot/Cold: Not tested Proprioception: Appears Intact Coordination Gross Motor Movements are Fluid and Coordinated: No Heel Shin Test: reduced excursion RLE, WNL LLE Motor  Motor Motor: Hemiplegia;Abnormal postural alignment and control Motor - Discharge Observations: R hemiparesis, decreased postural control and balance strategies, impaired motor control and strength deficits RLE/RUE  Mobility Bed Mobility Bed Mobility: Supine to Sit;Sit to Supine Supine to Sit: 6: Modified independent (Device/Increase time) Sit to Supine: 6: Modified independent (Device/Increase time) Transfers Transfers: Yes Stand Pivot Transfers: 6: Modified independent (Device/Increase time) Stand Pivot Transfer Details (indicate cue type and reason): with RW Locomotion  Ambulation Ambulation: Yes Ambulation/Gait Assistance: 6: Modified independent (Device/Increase time) Ambulation Distance (Feet): 320 Feet Assistive device: Rolling walker Gait Gait: Yes Gait Pattern: Impaired Gait Pattern: Trendelenburg;Lateral trunk lean to right;Poor foot clearance - right;Decreased dorsiflexion - right;Decreased weight shift to right;Trunk flexed Gait velocity: decreased for age/gender norms Stairs / Additional Locomotion Stairs: Yes Stairs Assistance: 5: Supervision Stairs Assistance Details: Verbal cues for precautions/safety Stair Management Technique: Two rails;Alternating pattern Number of Stairs: 12 Height of Stairs: 6 Ramp: 5: Supervision Curb: 5: Supervision Wheelchair Mobility Wheelchair Mobility: No (Pt ambulatory)  Trunk/Postural Assessment  Cervical Assessment Cervical Assessment: Within Functional Limits Thoracic Assessment Thoracic Assessment: Exceptions to [facility] (R lateral shift/functional scoliosis) Lumbar Assessment Lumbar Assessment:  Within Functional Limits Postural Control Postural Control: Deficits on evaluation (impaired strength and coordination for stepping and righting strategies)  Balance Balance Balance Assessed: Yes Standardized Balance Assessment Standardized Balance Assessment: Berg Balance Test;Timed Up and Go Test Berg Balance Test Sit to Stand: Able to stand  independently using hands Standing Unsupported: Able to stand safely 2 minutes Sitting with Back Unsupported but Feet Supported on Floor or Stool: Able to sit safely and securely 2 minutes Stand  to Sit: Uses backs of legs against chair to control descent Transfers: Able to transfer safely, definite need of hands Standing Unsupported with Eyes Closed: Able to stand 10 seconds with supervision Standing Ubsupported with Feet Together: Needs help to attain position but able to stand for 30 seconds with feet together From Standing, Reach Forward with Outstretched Arm: Can reach forward >12 cm safely (5") From Standing Position, Pick up Object from Floor: Able to pick up shoe, needs supervision From Standing Position, Turn to Look Behind Over each Shoulder: Looks behind one side only/other side shows less weight shift Turn 360 Degrees: Needs close supervision or verbal cueing Standing Unsupported, Alternately Place Feet on Step/Stool: Able to complete >2 steps/needs minimal assist Standing Unsupported, One Foot in Front: Needs help to step but can hold 15 seconds Standing on One Leg: Tries to lift leg/unable to hold 3 seconds but remains standing independently Total Score: 33 Timed Up and Go Test TUG: Normal TUG;Cognitive TUG Normal TUG (seconds): 38.52 Cognitive TUG (seconds): 40.3 (only one number correct) Static Sitting Balance Static Sitting - Balance Support: Feet supported;No upper extremity supported Static Sitting - Level of Assistance: 6: Modified independent (Device/Increase time) Dynamic Sitting Balance Dynamic Sitting - Balance Support:  During functional activity;Feet supported;No upper extremity supported Dynamic Sitting - Level of Assistance: 6: Modified independent (Device/Increase time) Dynamic Sitting - Balance Activities: Reaching for objects;Forward lean/weight shifting;Reaching across midline Static Standing Balance Static Standing - Balance Support: During functional activity;Bilateral upper extremity supported Static Standing - Level of Assistance: 6: Modified independent (Device/Increase time) Static Stance: Eyes closed Static Stance: Eyes Closed: S Dynamic Standing Balance Dynamic Standing - Balance Support: During functional activity;Left upper extremity supported Dynamic Standing - Level of Assistance: 6: Modified independent (Device/Increase time) Dynamic Standing - Balance Activities: Reaching for objects;Reaching across midline;Forward lean/weight shifting;Lateral lean/weight shifting Extremity Assessment  RUE Assessment RUE Assessment: Exceptions to WFL (ROM WFL, strength grossly 4/5) LUE Assessment LUE Assessment: Within Functional Limits RLE Assessment RLE Assessment: Exceptions to [employer] [facility] [facility] (Hip flexion 4/5, knee extension 4+/5, knee flexion 4/5, ankle dorsiflexion 4-/5, ankle plantarflexion 4/5) LLE Assessment LLE Assessment: Within Functional Limits  Skilled Therapeutic Intervention: Tx 1: Pt received seated in recliner, no c/o pain and agreeable to treatment. Dons B TEDs, shoes and RLE orthosis with mod I and increased time due to decreased coordination and fine motor control. Gait to gym x150' with RW, RLE orthosis and S. In parallel bars, alternating toe taps to 3" and 4" step with cues for R hip extension and stance control. Pt able to perform hip extension  Supine LE strengthening exercises including 2 x 10 clamshells BLE, attempted straight leg hip abduction however pt unable to perform, bridging BLE, bridging with alternating stepping for isolated hip control, sit <>stand without UEs. Quadruped  alternating UE reaching for core stability, RUE weight bearing and strengthening. Kinetron x3 reps of 2 min each with cues for reduced UE weight bearing, R hip extension in stance. Gait to return to room with RW and S. Remained seated in recliner at completion of session, all needs in reach.  Tx 2: Pt received seated in recliner, no c/o pain and agreeable to treatment. Daughter present to observe and prepare for d/c. Assessed all mobility as described above, including ambulation with RW on level and unlevel surface, stairs, bed mobility, couch/car/floor transfers at mod I level. Also assessed Berg balance scale, TUG and cognitive TUG with results as above. Continues to demonstrate increased falls risk based on Berg and TUG  scores. Daughter demonstrates ability to correctly cue patient if necessary. Educated pt in ongoing wellness activities he can pursue including yoga/tai chi as daughter expresses he has occasionally mentions interest in these activities. Educated pt in stability ball exercises for pt to perform at home. Pt returned to room and remained seated in w/c at completion of session, all needs in reach.    See Function Navigator for Current Functional Status.  Douglas Carrillo 12/28/2015, 3:58 PM

## 2015-12-28 NOTE — Progress Notes (Signed)
Occupational Therapy Discharge Summary  Patient Details  Name: Douglas Carrillo MRN: [id number] Date of Birth: [birth year]  Patient has met 75 of 11 long term goals due to improved activity tolerance, improved balance, postural control, ability to compensate for deficits, functional use of  RIGHT upper and RIGHT lower extremity, improved awareness and improved coordination.  Patient to discharge at overall Modified Independent level, recommending supervision with shower transfers and home management tasks.  Patient's care partner is independent to provide the necessary physical and cognitive assistance at discharge.  Patient's daughter plans to stay with patient for a couple weeks during transition and provide supervision with IADLs.    Reasons goals not met: N/A  Recommendation:  Patient will benefit from ongoing skilled OT services in home health setting to continue to advance functional skills in the area of BADL, iADL and Reduce care partner burden.  Equipment: tub bench  Reasons for discharge: treatment goals met and discharge from hospital  Patient/family agrees with progress made and goals achieved: Yes  OT Discharge Precautions/Restrictions  Precautions Precautions: Fall General   Vital Signs Oxygen Therapy O2 Device: Not Delivered Pain Pain Assessment Pain Assessment: No/denies pain Pain Score: 0-No pain ADL  See Function Navigator Vision/Perception  Vision- History Baseline Vision/History: Wears glasses Wears Glasses: At all times Patient Visual Report: No change from baseline Vision- Assessment Vision Assessment?: No apparent visual deficits Eye Alignment: Within Functional Limits Ocular Range of Motion: Within Functional Limits Alignment/Gaze Preference: Within Defined Limits  Cognition Overall Cognitive Status: Within Functional Limits for tasks assessed Arousal/Alertness: Awake/alert Orientation Level: Oriented X4 Memory: Appears intact Awareness: Appears  intact Problem Solving: Appears intact Safety/Judgment: Appears intact Sensation Sensation Light Touch: Impaired by gross assessment (per pt report sensation is improving, but still diminished in RUE) Stereognosis: Appears Intact Hot/Cold: Not tested Proprioception: Appears Intact Coordination Fine Motor Movements are Fluid and Coordinated: No Finger Nose Finger Test: improved coordination and motor control 9 Hole Peg Test: Rt: 33 seconds, Lt: 33 seconds Extremity/Trunk Assessment RUE Assessment RUE Assessment: Exceptions to WFL (ROM WFL, strength grossly 4/5) LUE Assessment LUE Assessment: Within Functional Limits   See Function Navigator for Current Functional Status.  [name] 12/28/2015, 11:03 AM

## 2015-12-29 LAB — BASIC METABOLIC PANEL
ANION GAP: 10 (ref 5–15)
BUN: 12 mg/dL (ref 6–20)
CHLORIDE: 111 mmol/L (ref 101–111)
CO2: 21 mmol/L — AB (ref 22–32)
Calcium: 9.4 mg/dL (ref 8.9–10.3)
Creatinine, Ser: 1.27 mg/dL — ABNORMAL HIGH (ref 0.61–1.24)
GFR calc Af Amer: 58 mL/min — ABNORMAL LOW (ref >60)
GFR calc non Af Amer: 50 mL/min — ABNORMAL LOW (ref >60)
GLUCOSE: 106 mg/dL — AB (ref 65–99)
POTASSIUM: 3.6 mmol/L (ref 3.5–5.1)
Sodium: 142 mmol/L (ref 135–145)

## 2015-12-29 LAB — CREATININE, SERUM
CREATININE: 1.33 mg/dL — AB (ref 0.61–1.24)
GFR calc Af Amer: 55 mL/min — ABNORMAL LOW (ref >60)
GFR calc non Af Amer: 47 mL/min — ABNORMAL LOW (ref >60)

## 2015-12-29 LAB — GLUCOSE, CAPILLARY: Glucose-Capillary: 112 mg/dL — ABNORMAL HIGH (ref 65–99)

## 2015-12-29 MED ORDER — POTASSIUM CHLORIDE CRYS ER 20 MEQ PO TBCR: EXTENDED_RELEASE_TABLET | ORAL | Status: AC

## 2015-12-29 MED ORDER — ISOSORBIDE MONONITRATE ER 60 MG PO TB24: 30.0000 mg | ORAL_TABLET | Freq: Every day | ORAL | Status: DC

## 2015-12-29 MED ORDER — METOPROLOL SUCCINATE ER 100 MG PO TB24: ORAL_TABLET | ORAL | Status: DC

## 2015-12-29 NOTE — Progress Notes (Signed)
Pt discharged to home with daughter. Discharge instructions given by Reesa Chew, PA with verbal understanding from pt and daughter. Belongings with pt.

## 2015-12-29 NOTE — Discharge Instructions (Signed)
Inpatient Rehab Discharge Instructions  Douglas Carrillo Discharge date and time: 12/29/15   Activities/Precautions/ Functional Status: Activity: no lifting, driving, or strenuous exercise  till cleared by MD Diet: cardiac diet Wound Care: none needed   Functional status:  ___ No restrictions     ___ Walk up steps independently ___ 24/7 supervision/assistance   ___ Walk up steps with assistance _X__ Intermittent supervision/assistance  ___ Bathe/dress independently _X__ Walk with walker    _X__ Bathe/dress with supervision ___ Walk Independently    ___ Shower independently ___ Walk with assistance    ___ Shower with assistance _X__ No alcohol     ___ Return to work/school ________  Special Instructions:    COMMUNITY REFERRALS UPON DISCHARGE:    Outpatient: PT & OT  Agency:[facility] [phone]   Date of Last Service:12/29/2015  Appointment Date/Time:1/12-Thursday 9:00-11:00 AM  Medical Equipment/Items [place]   [phone]   GENERAL COMMUNITY RESOURCES FOR PATIENT/FAMILY: Support Groups:[facility] SUPPORT GROUP SECOND Thursday OF EACH MONTH-3:00-4:00 PM  QUESTIONS CONTACT: Joellen Jersey [id number]    STROKE/TIA DISCHARGE INSTRUCTIONS SMOKING Cigarette smoking nearly doubles your risk of having a stroke & is the single most alterable risk factor  If you smoke or have smoked in the last 12 months, you are advised to quit smoking for your health.  Most of the excess cardiovascular risk related to smoking disappears within a year of stopping.  Ask you doctor about anti-smoking medications  [place] Quit Line: 1-800-QUIT NOW  Free Smoking Cessation Classes [phone]  CHOLESTEROL Know your levels; limit fat & cholesterol in your diet  Lipid Panel     Component Value Date/Time   CHOL 105 12/14/2015 0354   TRIG 76 12/14/2015 0354   HDL 37* 12/14/2015 0354   CHOLHDL 2.8 12/14/2015 0354   VLDL 15 12/14/2015 0354   LDLCALC 53 12/14/2015 0354      Many patients benefit from treatment even if their cholesterol is at goal.  Goal: Total Cholesterol (CHOL) less than 160  Goal:  Triglycerides (TRIG) less than 150  Goal:  HDL greater than 40  Goal:  LDL (LDLCALC) less than 100   BLOOD PRESSURE American Stroke Association blood pressure target is less that 120/80 mm/Hg  Your discharge blood pressure is:  BP: (!) 145/63 mmHg  Monitor your blood pressure  Limit your salt and alcohol intake  Many individuals will require more than one medication for high blood pressure  DIABETES (A1c is a blood sugar average for last 3 months) Goal HGBA1c is under 7% (HBGA1c is blood sugar average for last 3 months)  Diabetes:     Lab Results  Component Value Date   HGBA1C 6.8* 12/14/2015     Your HGBA1c can be lowered with medications, healthy diet, and exercise.  Check your blood sugar as directed by your physician  Call your physician if you experience unexplained or low blood sugars.  PHYSICAL ACTIVITY/REHABILITATION Goal is 30 minutes at least 4 days per week  Activity: No driving, Therapies:  See above Return to work: N/A  Activity decreases your risk of heart attack and stroke and makes your heart stronger.  It helps control your weight and blood pressure; helps you relax and can improve your mood.  Participate in a regular exercise program.  Talk with your doctor about the best form of exercise for you (dancing, walking, swimming, cycling).  DIET/WEIGHT Goal is to maintain a healthy weight  Your discharge diet is: Diet heart  healthy/carb modified Room service appropriate?: Yes; Fluid consistency:: Thin liquids Your height is:   Your current weight is: Weight: 92.6 kg (204 lb 2.3 oz) Your Body Mass Index (BMI) is:    Following the type of diet specifically designed for you will help prevent another stroke.  Your goal weight is:    Your goal Body Mass Index (BMI) is 19-24.  Healthy food habits  can help reduce 3 risk factors for stroke:  High cholesterol, hypertension, and excess weight.  RESOURCES Stroke/Support Group:  Call [phone]   STROKE EDUCATION PROVIDED/REVIEWED AND GIVEN TO PATIENT Stroke warning signs and symptoms How to activate emergency medical system (call 911). Medications prescribed at discharge. Need for follow-up after discharge. Personal risk factors for stroke. Pneumonia vaccine given:  Flu vaccine given:  My questions have been answered, the writing is legible, and I understand these instructions.  I will adhere to these goals & educational materials that have been provided to me after my discharge from the hospital.      My questions have been answered and I understand these instructions. I will adhere to these goals and the provided educational materials after my discharge from the hospital.  Patient/Caregiver Signature _______________________________ Date __________  Clinician Signature _______________________________________ Date __________  Please bring this form and your medication list with you to all your follow-up doctor's appointments.

## 2015-12-29 NOTE — Progress Notes (Signed)
[place] PHYSICAL MEDICINE & REHABILITATION     PROGRESS NOTE    Subjective/Complaints:  Pt has some nasal congestion with occ cough last noc , no sweats or chills.  Feels ready for D/C  ROS: Denies CP, SOB, N/V/D.no bladder issues  Objective: Vital Signs: Blood pressure 145/63, pulse 95, temperature 98.9 F (37.2 C), temperature source Oral, resp. rate 18, weight 92.6 kg (204 lb 2.3 oz), SpO2 94 %. No results found.  Recent Labs  12/26/15 1015  WBC 3.2*  HGB 9.4*  HCT 29.3*  PLT 322    Recent Labs  12/26/15 1015  NA 139  K 3.9  CL 111  GLUCOSE 112*  BUN 12  CREATININE 1.30*  CALCIUM 9.5   CBG (last 3)   Recent Labs  12/28/15 1636 12/28/15 2059 12/29/15 0638  GLUCAP 90 111* 112*    Wt Readings from Last 3 Encounters:  12/22/15 92.6 kg (204 lb 2.3 oz)  12/13/15 92.987 kg (205 lb)  12/12/15 92.987 kg (205 lb)    Physical Exam:  BP 145/63 mmHg  Pulse 95  Temp(Src) 98.9 F (37.2 C) (Oral)  Resp 18  Wt 92.6 kg (204 lb 2.3 oz)  SpO2 94% Constitutional: He appears well-developed and well-nourished. NAD. Vital signs reviewed.  Appears younger than stated age.  HENT: Normocephalic and atraumatic.  Mouth/Throat: No oropharyngeal exudate.  Eyes: Conjunctivae and EOM are normal. No scleral icterus.  Cardiovascular: Normal rate and regular rhythm. Holosystolic murmur.  Respiratory: Effort normal and breath sounds normal. No stridor. He exhibits no tenderness.  GI: Soft. Bowel sounds are normal. He exhibits no distension. There is no tenderness.  Musculoskeletal: He exhibits edema (right ankle with trace edema). He exhibits no tenderness.  Neurological: He is alert and oriented to person, place, and time.  Right facial weakness with minimal dysarthria.  Able to follow commands without difficulty.  Sensation diminished to light touch RUE/RLE (improving) Motor: LUE/LLE: Antigravity strength   Skin: Skin is warm and dry. No rash noted.   Psychiatric: He has a normal mood and affect. His behavior is normal. Judgment and thought content normal.    Assessment/Plan: 1. 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 Stable for D/C today F/u PCP in 1-2 weeks F/u PM&R 3 weeks See D/C summary See D/C instructions  Function:  Bathing Bathing position   Position: Shower  Bathing parts Body parts bathed by patient: Right arm, Right upper leg, Left arm, Left upper leg, Chest, Abdomen, Front perineal area, Left lower leg, Buttocks, Back, Right lower leg Body parts bathed by helper: Right lower leg  Bathing assist Assist Level: More than reasonable time      Upper Body Dressing/Undressing Upper body dressing   What is the patient wearing?: Pull over shirt/dress     Pull over shirt/dress - Perfomed by patient: Thread/unthread right sleeve, Thread/unthread left sleeve, Put head through opening, Pull shirt over trunk          Upper body assist Assist Level: More than reasonable time   Set up : To obtain clothing/put away  Lower Body Dressing/Undressing Lower body dressing   What is the patient wearing?: Pants, Shoes, [employer], Underwear, AFO Underwear - Performed by patient: Thread/unthread right underwear leg, Thread/unthread left underwear leg, Pull underwear up/down   Pants- Performed by patient: Thread/unthread left pants leg, Thread/unthread right pants leg, Pull pants up/down Pants- Performed by helper: Thread/unthread right pants leg Non-skid slipper socks- Performed by patient: Don/doff right sock, Don/doff left sock  Socks - Performed by patient: Don/doff right sock, Don/doff left sock   Shoes - Performed by patient: Don/doff left shoe, Fasten right, Fasten left, Don/doff right shoe Shoes - Performed by helper: Don/doff right shoe AFO - Performed by patient: Don/doff right AFO AFO - Performed by helper: Don/doff right AFO TED Hose - Performed by patient: Don/doff right TED hose, Don/doff  left TED hose TED Hose - Performed by helper: Don/doff right TED hose, Don/doff left TED hose  Lower body assist Assist for lower body dressing: More than reasonable time      Toileting Toileting   Toileting steps completed by patient: Adjust clothing prior to toileting, Performs perineal hygiene, Adjust clothing after toileting   Toileting Assistive Devices: Grab bar or rail  Toileting assist Assist level: More than reasonable time   Transfers Chair/bed transfer   Chair/bed transfer method: Stand pivot Chair/bed transfer assist level: No Help, no cues, assistive device, takes more than a reasonable amount of time Chair/bed transfer assistive device: Medical sales representative     Max distance: 320 Assist level: No help, No cues, assistive device, takes more than a reasonable amount of time   Wheelchair   Type: Manual Max wheelchair distance: 175 Assist Level: Supervision or verbal cues  Cognition Comprehension Comprehension assist level: Understands complex 90% of the time/cues 10% of the time  Expression Expression assist level: Expresses complex 90% of the time/cues < 10% of the time  Social Interaction Social Interaction assist level: Interacts appropriately with others with medication or extra time (anti-anxiety, antidepressant).  Problem Solving Problem solving assist level: Solves complex 90% of the time/cues < 10% of the time  Memory Memory assist level: More than reasonable amount of time    Medical Problem List and Plan: 1. 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 DVT Prophylaxis/Anticoagulation: Mechanical: Sequential compression devices, below knee Bilateral lower extremities, Pharmaceutical: Lovenox 3. Pain Management: Tylenol and Norco PRN-  4. Mood: LCSW to follow for evaluation and support 5. Neuropsych: This patient is capable of making decisions on his own  behalf. 6. Skin/Wound Care: Maintain adequate nutrition and hydration status. Routine pressure relief measures.  7. Fluids/Electrolytes/Nutrition: Monitor I/O.po intake improving             Protein supplement added  8. HTN Cont, Imdur , Lasix at 20mg - will increase to home dose of 40mg  Monitor VS during therapies and at rest and adjust as indicated to ensure goal BP maintained              Improved today         9. DM HbA1c 6.8 on 12/22 FSBS trend:  CBG (last 3)   Recent Labs  12/28/15 1636 12/28/15 2059 12/29/15 0638  GLUCAP 90 111* 112*     10. CAD s/p recent CABG-on Imdur Monitor in accordance with increased physical activity and avoid UE resistance excercises 11. OSA Cont O2 at night Evaluate signs of daytime somnolence. 12. AS See CAD 13. Anemia recheck CBC 9.4 on 1/3 PCP f/u  14. Spastic bladder Cont Ditropan  15. GERD Cont Protonix 16. Hypokalemia: Normalized after supplementation 79meq K+ supplement- 1/3 K+ 3.9, will need recheck next wk with HH  17. COPD Cont Spiriva- currently asymptomatic 18.  URI-minimal sx, avoid cold remedies with decongestant       LOS (Days) 14 A FACE TO FACE EVALUATION WAS PERFORMED  Charlett Blake 12/29/2015 6:51 AM

## 2015-12-29 NOTE — Discharge Summary (Signed)
Physician Discharge Summary  Patient ID: Douglas Carrillo MRN: [id number] DOB/AGE: 80-Dec-1929 80 y.o.  Admit date: 12/15/2015 Discharge date: 12/29/2015  Discharge Diagnoses:  Principal Problem:   Stroke, acute, thrombotic ([place]) Active Problems:   CAD (coronary artery disease) of artery bypass graft   Hyperlipidemia with target LDL less than 70   Essential hypertension   DM2 (diabetes mellitus, type 2) (HCC)   Aortic valve stenosis   COPD (chronic obstructive pulmonary disease) (HCC)   Nocturnal hypoxia   OSA (obstructive sleep apnea)   Prolonged Q-T interval on ECG   Hemiparesis affecting dominant side as late effect of stroke (HCC)   Anemia   Spastic neurogenic bladder   GERD (gastroesophageal reflux disease)   Hypokalemia   Discharged Condition: stable.     Labs:  Basic Metabolic Panel: BMP Latest Ref Rng 12/29/2015 12/26/2015 12/22/2015  Glucose 65 - 99 mg/dL - 112(H) 117(H)  BUN 6 - 20 mg/dL - 12 14  Creatinine 0.61 - 1.24 mg/dL 1.33(H) 1.30(H) 1.31(H)  Sodium 135 - 145 mmol/L - 139 139  Potassium 3.5 - 5.1 mmol/L - 3.9 3.7  Chloride 101 - 111 mmol/L - 111 107  CO2 22 - 32 mmol/L - 20(L) 21(L)  Calcium 8.9 - 10.3 mg/dL - 9.5 9.5     CBC: CBC Latest Ref Rng 12/26/2015 12/16/2015 12/15/2015  WBC 4.0 - 10.5 K/uL 3.2(L) 4.1 4.9  Hemoglobin 13.0 - 17.0 g/dL 9.4(L) 8.4(L) 8.6(L)  Hematocrit 39.0 - 52.0 % 29.3(L) 27.4(L) 27.3(L)  Platelets 150 - 400 K/uL 322 198 176     CBG:  Recent Labs Lab 12/28/15 0632 12/28/15 1135 12/28/15 1636 12/28/15 2059 12/29/15 0638  GLUCAP 111* 133* 90 111* 112*    Brief HPI:   Douglas Carrillo is an 82 y.o. right handed male with history of HTN, DM type2, CABG, OSA--oxygen at nights, severe aortic stenosis with recent onset of SOB and cardiac cath 12/20 with transient right arm and leg weakness post procedure likely due to embolic infarct. He was discharged to home but readmitted the next day on 12/21 am with fall out of bed due to  right leg weakness and decreased sensation right half of his body. MRI brain done revealing acute infarct along ventral surface of left medulla and MRA brain/head without significant stenosis. Dr. Leonie Man consulted and felt that stroke due to sequela of cardiac cath and to continue Plavix for secondary stroke prevention. Carotid dopplers without significant ICA stenosis. Patient with resultant mild right facial droop, RLE weakness affecting balance and mobility as well as hypoxia with activity. CIR recommended for follow up therapy   Hospital Course: Douglas Carrillo was admitted to rehab 12/15/2015 for inpatient therapies to consist of PT and OT at least three hours five days a week. Past admission physiatrist, therapy team and rehab RN have worked together to provide customized collaborative inpatient rehab.   He continues on Plavix for stroke prevention and H/H has improved to 9.4/29.3. No complaints of chest discomfort or dyspnea reported with increase in activity level. Renal status has been monitored with routine check of lytes and is close to baseline.  Blood pressures were monitored on bid basis and lasix was resumed due to labile blood pressures.  Hypokalemia has resolved with increase in supplement and she will need repeat BMET next week. Nutritional supplement was added to help with protein stores. Po intake has improved and he continent of bowel and bladder. Respiratory status is stable and he continues to use oxygen at bedtime for  management of sleep apnea. He has made steady progress during his rehab stay and is modified independent at discharge.  He will continue to receive follow up outpatient PT and OT at [facility] starting 01/03/15.    Rehab course: During patient's stay in rehab weekly team conferences were held to monitor patient's progress, set goals and discuss barriers to discharge. At admission, patient required He has had improvement in activity tolerance, balance, postural  control, as well as ability to compensate for deficits. He is has had improvement in functional use RUE  and RLE as well as improved awareness. He is modified independent for ADL tasks and requires supervision with shower transfers and home management tasks. He is modified independent for tranfers and ambulating 320' with RW. Family education was completed with daughter who will provide intermittent supervision after discharge.    Disposition: 01-Home or Self Care  Diet: Heart healthy. Low salt.   Special Instructions: 1. Check blood sugars 2-3 times a day till follow up with PMD. 2. Low salt diet. Check weights daily. 3.  No driving.  4. Recheck  BMET and CBC at post hospital follow up.       Discharge Instructions    Ambulatory referral to Physical Medicine Rehab    Complete by:  As directed   Post stroke follow up in 3- 4 weeks            Medication List    STOP taking these medications        benzonatate 100 MG capsule  Commonly known as:  TESSALON     losartan-hydrochlorothiazide 100-25 MG tablet  Commonly known as:  HYZAAR     metFORMIN 500 MG 24 hr tablet  Commonly known as:  GLUCOPHAGE-XR     ZETIA 10 MG tablet  Generic drug:  ezetimibe      TAKE these medications        AEROCHAMBER PLUS FLO-VU Misc     aspirin 81 MG tablet  Take 81 mg by mouth daily.     b complex vitamins capsule  Take 1 capsule by mouth daily.     B-D ULTRAFINE III SHORT PEN 31G X 8 MM Misc  Generic drug:  Insulin Pen Needle     CENTRUM SILVER ULTRA MENS PO  Take 1 tablet by mouth daily.     clopidogrel 75 MG tablet  Commonly known as:  PLAVIX  TAKE 1 TABLET DAILY     CRESTOR 20 MG tablet  Generic drug:  rosuvastatin  Take 20 mg by mouth daily.     furosemide 40 MG tablet  Commonly known as:  LASIX  TAKE 1 TABLET DAILY     insulin aspart 100 UNIT/ML injection  Commonly known as:  novoLOG  Inject 0-9 Units into the skin 3 (three) times daily with meals.     isosorbide  mononitrate 60 MG 24 hr tablet  Commonly known as:  IMDUR  Take 0.5 tablets (30 mg total) by mouth daily.     latanoprost 0.005 % ophthalmic solution  Commonly known as:  XALATAN  Place 1 drop into both eyes at bedtime.     meclizine 25 MG tablet  Commonly known as:  ANTIVERT  Take 25 mg by mouth as needed for dizziness.     metoprolol succinate 100 MG 24 hr tablet  Commonly known as:  TOPROL-XL  Take 1/2 tablet daily     NEXIUM 40 MG capsule  Generic drug:  esomeprazole  Take 40 mg  by mouth daily.     ONETOUCH DELICA LANCETS 99991111 Misc  1 each by Other route 2 (two) times daily. Use 1 lancet each to check glucose bid     ONETOUCH VERIO test strip  Generic drug:  glucose blood  1 strip by Other route 2 (two) times daily. Use 1 strip to check glucose bid     oxybutynin 5 MG 24 hr tablet  Commonly known as:  DITROPAN-XL  Take 5 mg by mouth daily.     potassium chloride SA 20 MEQ tablet  Commonly known as:  KLOR-CON M20  Take 3 tablets daily.     PROAIR HFA 108 (90 Base) MCG/ACT inhaler  Generic drug:  albuterol  Inhale into the lungs as needed.     RESTASIS 0.05 % ophthalmic emulsion  Generic drug:  cycloSPORINE  Place 1 drop into both eyes 2 (two) times daily.     STIOLTO RESPIMAT 2.5-2.5 MCG/ACT Aers  Generic drug:  Tiotropium Bromide-Olodaterol  USE 2 INHALATIONS ORALLY   DAILY     THERATEARS ALLERGY 0.025 % ophthalmic solution  Generic drug:  ketotifen  Place 1 drop into both eyes 2 (two) times daily.     VICTOZA 18 MG/3ML Sopn  Generic drug:  Liraglutide  Inject 1.8 mg as directed every morning.     vitamin C 500 MG tablet  Commonly known as:  ASCORBIC ACID  Take 500 mg by mouth 2 (two) times daily.     Vitamin D (Ergocalciferol) 50000 units Caps capsule  Commonly known as:  DRISDOL  Take 50,000 Units by mouth every 14 (fourteen) days. Every other week on Sunday     vitamin E 400 UNIT capsule  Take 400 Units by mouth daily.       Follow-up  Information    Follow up with Limmie Patricia, MD On 01/09/2016.   Specialty:  Endocrinology   Why:  APPT @ 2:40 PM   Contact information:   [place] [address] [phone]       Follow up with Charlett Blake, MD.   Specialty:  Physical Medicine and Rehabilitation   Why:  office will call you with appointment   Contact information:   [place] [address] [phone]       Follow up with Troy Sine, MD. Call today.   Specialty:  Cardiology   Why:  for follow up cardiac appointment    Contact information:   [address] [place] [address] [phone]       Follow up with Antony Contras, MD. Call today.   Specialties:  Neurology, Radiology   Why:  for follow up appointment in 4 weeks   Contact information:   [address] [place] [address] [phone]       Follow up with [facility], MD On 01/03/2016.   Specialty:  Pulmonary Disease   Why:  have routine follow up appointment   Contact information:   [place] [id number] [phone]       Signed: Bary Leriche 12/29/2015, 11:40 AM

## 2015-12-29 NOTE — Progress Notes (Signed)
Social Work  Discharge Note  The overall goal for the admission was met for:   Discharge location: [place]  Length of Stay: Yes-14 DAYS  Discharge activity level: Yes-MOD/I-SUPERVISION LEVEL  Home/community participation: Yes  Services provided included: MD, RD, PT, OT, RN, CM, TR, Pharmacy and SW  Financial Services: Medicare and Private Insurance: [facility]  Follow-up services arranged: Outpatient: [facility]-PT & OT 1/12 9:00-11:00, DME: [facility] and Patient/Family has no preference for HH/DME agencies  Comments (or additional information):DAUGHTER IS A RN AND WAS HERE FOR NUMEROUS THERAPIES, SHE IS AWARE OF PT'S CARE NEEDS. DAUGHTER TO TAKE TUB BENCH TO [facility] AND SWITCH OUT WITH TUB SEAT DUE TO TUB BENCH IS TOO BIG FOR THE TUB-PT HAS A SMALL TUB AT HOME.  Patient/Family verbalized understanding of follow-up arrangements: Yes  Individual responsible for coordination of the follow-up plan: PATIENT & KIM-DAUGHTER  Confirmed correct DME delivered: Elease Hashimoto 12/29/2015    Elease Hashimoto

## 2016-01-03 ENCOUNTER — Ambulatory Visit: Payer: Medicare Other | Admitting: Physical Therapy

## 2016-01-03 ENCOUNTER — Encounter: Payer: Medicare Other | Admitting: Occupational Therapy

## 2016-01-03 ENCOUNTER — Ambulatory Visit: Payer: Medicare Other | Admitting: Internal Medicine

## 2016-01-04 ENCOUNTER — Ambulatory Visit
Admission: RE | Admit: 2016-01-04 | Discharge: 2016-01-04 | Disposition: A | Discharge status: Home / Self Care | Payer: Medicare Other | Source: Ambulatory Visit | Attending: Allergy and Immunology | Admitting: Allergy and Immunology

## 2016-01-04 ENCOUNTER — Ambulatory Visit: Payer: Medicare Other | Admitting: Occupational Therapy

## 2016-01-04 ENCOUNTER — Ambulatory Visit: Payer: Medicare Other | Admitting: Rehabilitative and Restorative Service Providers"

## 2016-01-04 ENCOUNTER — Other Ambulatory Visit: Payer: Self-pay | Admitting: Allergy and Immunology

## 2016-01-04 DIAGNOSIS — R062 Wheezing: Secondary | ICD-10-CM

## 2016-01-08 ENCOUNTER — Encounter: Payer: Self-pay | Admitting: Occupational Therapy

## 2016-01-08 ENCOUNTER — Ambulatory Visit: Payer: Medicare Other | Attending: Physical Medicine & Rehabilitation | Admitting: Occupational Therapy

## 2016-01-08 ENCOUNTER — Ambulatory Visit: Payer: Medicare Other | Admitting: Physical Therapy

## 2016-01-08 ENCOUNTER — Encounter: Payer: Self-pay | Admitting: Physical Therapy

## 2016-01-08 VITALS — HR 97

## 2016-01-08 DIAGNOSIS — G8191 Hemiplegia, unspecified affecting right dominant side: Secondary | ICD-10-CM

## 2016-01-08 DIAGNOSIS — R6889 Other general symptoms and signs: Secondary | ICD-10-CM

## 2016-01-08 DIAGNOSIS — IMO0002 Reserved for concepts with insufficient information to code with codable children: Secondary | ICD-10-CM

## 2016-01-08 DIAGNOSIS — R29898 Other symptoms and signs involving the musculoskeletal system: Secondary | ICD-10-CM

## 2016-01-08 DIAGNOSIS — M6289 Other specified disorders of muscle: Secondary | ICD-10-CM | POA: Insufficient documentation

## 2016-01-08 DIAGNOSIS — I69898 Other sequelae of other cerebrovascular disease: Secondary | ICD-10-CM | POA: Insufficient documentation

## 2016-01-08 DIAGNOSIS — R269 Unspecified abnormalities of gait and mobility: Secondary | ICD-10-CM

## 2016-01-08 DIAGNOSIS — R413 Other amnesia: Secondary | ICD-10-CM

## 2016-01-08 DIAGNOSIS — Z7409 Other reduced mobility: Secondary | ICD-10-CM

## 2016-01-08 DIAGNOSIS — R0600 Dyspnea, unspecified: Secondary | ICD-10-CM | POA: Insufficient documentation

## 2016-01-08 DIAGNOSIS — R2689 Other abnormalities of gait and mobility: Secondary | ICD-10-CM

## 2016-01-08 DIAGNOSIS — R29818 Other symptoms and signs involving the nervous system: Secondary | ICD-10-CM | POA: Insufficient documentation

## 2016-01-08 DIAGNOSIS — R279 Unspecified lack of coordination: Secondary | ICD-10-CM | POA: Insufficient documentation

## 2016-01-08 DIAGNOSIS — R531 Weakness: Secondary | ICD-10-CM | POA: Insufficient documentation

## 2016-01-08 DIAGNOSIS — I6980 Unspecified sequelae of other cerebrovascular disease: Secondary | ICD-10-CM | POA: Insufficient documentation

## 2016-01-08 DIAGNOSIS — R2681 Unsteadiness on feet: Secondary | ICD-10-CM | POA: Insufficient documentation

## 2016-01-08 NOTE — Therapy (Signed)
[facility] [address] [place], [place], [address] Phone: [phone]   Fax:  [phone]  Occupational Therapy Evaluation  Patient Details  Name: Douglas Carrillo MRN: [id number] Date of Birth: 22-Jun-1928 Referring Provider: Dr. Alysia Penna  Encounter Date: 01/08/2016      OT End of Session - 01/08/16 1347    Visit Number 1   Number of Visits 17   Date for OT Re-Evaluation 03/06/16   Authorization Type MCR - G code needed   Authorization - Visit Number 1   Authorization - Number of Visits 10   OT Start Time 0850   OT Stop Time 0930   OT Time Calculation (min) 40 min   Activity Tolerance Patient tolerated treatment well      Past Medical History  Diagnosis Date  . Hypertension   . High cholesterol   . Prostate cancer ([place])   . CAD (coronary artery disease)   . OSA (obstructive sleep apnea)   . COPD (chronic obstructive pulmonary disease) (HCC)     MODERATE  . Diabetes mellitus ([employer])   . GERD (gastroesophageal reflux disease)   . Vertigo   . Arthritis     OA  . Anemia     Past Surgical History  Procedure Laterality Date  . Coronary artery bypass graft  03/29/1999    x 4:  LIMA to the LAD,SVG to the diagonal branch of the LAD,SVG to the intermediate coronary artery & SVG to the posterior descending branch of the RCA.  . Colon surgery      x 4  . Nm myocar perf wall motion  04/28/2012    Low Risk Scan  . Prostate surgery    . Cardiac catheterization    . Cardiac catheterization N/A 12/12/2015    Procedure: Right/Left Heart Cath and Coronary/Graft Angiography;  Surgeon: Troy Sine, MD;  Location: [facility];  Service: Cardiovascular;  Laterality: N/A;  . Peripheral vascular catheterization N/A 12/12/2015    Procedure: Aortic Arch Angiography;  Surgeon: Troy Sine, MD;  Location: [facility];  Service: Cardiovascular;  Laterality: N/A;  . Eye surgery      There were no vitals  filed for this visit.  Visit Diagnosis:  Right hemiparesis ([place]) - Plan: Ot plan of care cert/re-cert  Lack of coordination due to stroke - Plan: Ot plan of care cert/re-cert  Decreased functional mobility and endurance - Plan: Ot plan of care cert/re-cert  Weakness of right arm - Plan: Ot plan of care cert/re-cert  Weakness of right hand - Plan: Ot plan of care cert/re-cert  Memory deficit - Plan: Ot plan of care cert/re-cert      Subjective Assessment - 01/08/16 0856    Patient is accompained by: Family member  DAUGHTER AND GRANDDAUGHTER   Pertinent History CABG 2000, CAD, Stenosis   Patient Stated Goals Writing and returning to gym   Currently in Pain? No/denies           [facility] OT Assessment - 01/08/16 0001    Assessment   Diagnosis Lt CVA   Referring Provider Dr. Alysia Penna   Onset Date 12/13/15   Prior Therapy 12/15/15 - 12/29/15 inpatient rehab  acute 12/21 - 12/15/15   Precautions   Precautions Fall  no driving   Restrictions   Weight Bearing Restrictions No   Balance Screen   Has the patient fallen in the past 6 months Yes   How many times? once  Has the patient had a decrease in activity level because of a fear of falling?  No   Is the patient reluctant to leave their home because of a fear of falling?  No   Home  Environment   Additional Comments Daughter has been living with patient since d/c from hospital. DME: FWW, Quad cane, tub bench.    Lives With Alone  1 story house, 2 steps to enter   Prior Function   Level of Independence Independent  and driving   Vocation Retired   ADL   ADL comments independent with eating and grooming. Min assist with dressing (with compression stocking and getting Rt leg in pants). Supervision with bathing, min assist with bath transfer to get Rt leg in/out. Mod I with toileting. Dependent for cooking/cleaning. Prior to CVA could make egg, microwaveable, heat up something on stove but went out for dinner.    Mobility    Mobility Status Needs assist   Mobility Status Comments Using walker   Written Expression   Dominant Hand Right   Handwriting 90% legible   Vision - History   Baseline Vision Wears glasses all the time  bifocals   Visual History Glaucome   Patient Visual Report --  cataracts with implants both eyes   Vision Assessment   Comment denies change   Cognition   Overall Cognitive Status Cognition to be further assessed in functional context PRN  denies change   Mini Mental State Exam  Subtracts by 7's with max errors. Immediate recall: 3/3 Delayed recall: 1/3   Sensation   Light Touch Appears Intact   Coordination   9 Hole Peg Test Right;Left   Right 9 Hole Peg Test 36.50 sec.    Left 9 Hole Peg Test 32.09 sec   Box and Blocks Rt = 45   Edema   Edema none   ROM / Strength   AROM / PROM / Strength AROM;Strength   AROM   Overall AROM Comments LUE AROM WNL's. RUE WFL's (lacks end range shoulder flexion)   Strength   Overall Strength Comments LUE grossly 5/5. RUE grossly 4/5 to 4+/5   Hand Function   Right Hand Grip (lbs) 63 lbs, 57 lbs   Left Hand Grip (lbs) 80 lbs                           OT Short Term Goals - 01/08/16 1351    OT SHORT TERM GOAL #1   Title Independent with HEP for coordination and grip strength (all STG's due 02/07/16)    Time 4   Period Weeks   Status New   OT SHORT TERM GOAL #2   Title Independent with dressing   Baseline min assist   Time 4   Period Weeks   Status New   OT SHORT TERM GOAL #3   Title Independent with tub transfer   Baseline assist for Rt leg managment   Time 4   Period Weeks   Status New   OT SHORT TERM GOAL #4   Title Independent with memory strategies   Time 4   Period Weeks   Status New   OT SHORT TERM GOAL #5   Title Improve coordination as evidenced by performing 9 hole peg test Rt hand to 32 sec. or less   Time 4   Period Weeks   Status New   Additional Short Term Goals   Additional Short Term  Goals Yes  OT SHORT TERM GOAL #6   Title Improve grip strength by 5 lbs or greater Rt hand   Baseline eval - 63, 57 lbs (Lt = 80 lbs)   Time 4   Period Weeks   Status New   OT SHORT TERM GOAL #7   Title Pt to write 1/2 page in reasonable amount of time maintaining 90% or greater legibility   Time 4   Period Weeks   Status New           OT Long Term Goals - 01/11/16 1355    OT LONG TERM GOAL #1   Title Independent for RUE strengthening HEP (All LTG's due 03/06/16)   Time 8   Period Weeks   Status New   OT LONG TERM GOAL #2   Title Pt to perform 20 minutes of physical activity without rest   Time 8   Period Weeks   Status New   OT LONG TERM GOAL #3   Title Pt to return to light cooking (breakfast, sandwich prep, microwaveable) safely at mod I level in prep for living alone again   Time 8   Period Weeks   Status New   OT LONG TERM GOAL #4   Title Pt to return to prior level of cleaning at mod I level with DME prn   Time 8   Period Weeks   Status New   OT LONG TERM GOAL #5   Title Pt to verbalize understanding with community gym re-entry and possible precautions   Time 8   Period Weeks   Status New   Long Term Additional Goals   Additional Long Term Goals Yes   OT LONG TERM GOAL #6   Title Pt to perform simple physical and cognitive divided attention task (in prep for driving) at [id number] accuracy or greater   Time 8   Period Weeks   Status New               Plan - 2016/01/11 1347    Clinical Impression Statement Pt is a 82 y.o. male who presents to outpatient rehab s/p Lt CVA with Rt hemiparesis on 12/13/15. Pt went in for cardiac catherization on 12/12/15 but not admitted, then returned following day with symptoms of Rt sided weakness. Pt also with PMH for CABG, CAD, stenosis (see EPIC for full PMH).    Pt will benefit from skilled therapeutic intervention in order to improve on the following deficits (Retired) Decreased coordination;Difficulty walking;Decreased  endurance;Decreased safety awareness;Decreased activity tolerance;Decreased balance;Decreased knowledge of use of DME;Impaired UE functional use;Decreased mobility;Decreased strength;Decreased cognition   Rehab Potential Good   OT Frequency 2x / week   OT Duration 8 weeks  PLUS EVALUATION   OT Treatment/Interventions Self-care/ADL training;Patient/family education;Therapist, nutritional;Therapeutic exercise;Neuromuscular education;Manual Therapy;Splinting;DME and/or AE instruction;Therapeutic activities;Passive range of motion;Cognitive remediation/compensation;Fluidtherapy;Moist Heat;Electrical Stimulation   Plan coordination and putty HEP, Memory strategiues   Consulted and Agree with Plan of Care Patient;Family member/caregiver   Family Member Consulted Daughter          G-Codes - 01/11/2016 1358    Functional Assessment Tool Used RUE: GRIP = 60 lbs average, 9 hole = 36.50 sec.    Functional Limitation Carrying, moving and handling objects   Carrying, Moving and Handling Objects Current Status [phone]) At least 20 percent but less than 40 percent impaired, limited or restricted   Carrying, Moving and Handling Objects Goal Status [id number]) 0 percent impaired, limited or restricted      Problem List  Patient Active Problem List   Diagnosis Date Noted  . Stroke, acute, thrombotic ([place]) 12/15/2015  . Hemiparesis affecting dominant side as late effect of stroke ([place]) 12/15/2015  . Anemia 12/15/2015  . Spastic neurogenic bladder 12/15/2015  . GERD (gastroesophageal reflux disease) 12/15/2015  . Hypokalemia 12/15/2015  . Cerebral infarction due to unspecified mechanism   . CVA (cerebral infarction) 12/13/2015  . Prolonged Q-T interval on ECG 12/13/2015  . Right sided weakness 12/13/2015  . Elevated troponin 12/13/2015  . Acute kidney failure ([place]) 12/13/2015  . Normocytic anemia 12/13/2015  . Diabetes mellitus with complication ([place])   . CAD in native artery   . Aortic stenosis    . OSA (obstructive sleep apnea) 02/08/2015  . COPD, moderate ([place]) 01/12/2015  . COPD (chronic obstructive pulmonary disease) ([place]) 11/10/2014  . Nocturnal hypoxia 11/10/2014  . Dyspnea and respiratory abnormality 10/19/2014  . Aortic valve stenosis 05/27/2014  . CAD (coronary artery disease) of artery bypass graft 11/30/2013  . CAD (coronary artery disease) 11/30/2013  . Hyperlipidemia with target LDL less than 70 11/30/2013  . Essential hypertension 11/30/2013  . DM2 (diabetes mellitus, type 2) ([place]) 11/30/2013  . RBBB 11/30/2013    Carey Bullocks, OTR/L 01/08/2016, 2:01 PM  [facility] [address] [place], [place], [address] Phone: [phone]   Fax:  [phone]  Name: Kairee Amici MRN: [id number] Date of Birth: 05/22/1928

## 2016-01-08 NOTE — Therapy (Signed)
[facility] [address] [place], [place], [address] Phone: [phone]   Fax:  [phone]  Physical Therapy Evaluation  Patient Details  Name: Douglas Carrillo MRN: [id number] Date of Birth: [birth year] Referring Provider: Alysia Penna, MD  Encounter Date: 01/08/2016      PT End of Session - 01/08/16 1015    Visit Number 1   Number of Visits 27   Date for PT Re-Evaluation 03/08/16   Authorization Type Medicare G-code & progress report every 10 visits   PT Start Time 0930   PT Stop Time 1014   PT Time Calculation (min) 44 min   Equipment Utilized During Treatment Gait belt   Activity Tolerance Patient limited by fatigue   Behavior During Therapy [facility] for tasks assessed/performed      Past Medical History  Diagnosis Date  . Hypertension   . High cholesterol   . Prostate cancer ([place])   . CAD (coronary artery disease)   . OSA (obstructive sleep apnea)   . COPD (chronic obstructive pulmonary disease) (HCC)     MODERATE  . Diabetes mellitus ([place])   . GERD (gastroesophageal reflux disease)   . Vertigo   . Arthritis     OA  . Anemia     Past Surgical History  Procedure Laterality Date  . Coronary artery bypass graft  03/29/1999    x 4:  LIMA to the LAD,SVG to the diagonal branch of the LAD,SVG to the intermediate coronary artery & SVG to the posterior descending branch of the RCA.  . Colon surgery      x 4  . Nm myocar perf wall motion  04/28/2012    Low Risk Scan  . Prostate surgery    . Cardiac catheterization    . Cardiac catheterization N/A 12/12/2015    Procedure: Right/Left Heart Cath and Coronary/Graft Angiography;  Surgeon: Troy Sine, MD;  Location: [facility];  Service: Cardiovascular;  Laterality: N/A;  . Peripheral vascular catheterization N/A 12/12/2015    Procedure: Aortic Arch Angiography;  Surgeon: Troy Sine, MD;  Location: [facility];  Service: Cardiovascular;   Laterality: N/A;  . Eye surgery      Filed Vitals:   01/08/16 0949  Pulse: 97  SpO2: 94%  After 34M gait velocity with RW, HR 100 & SpO2 93% dyspnea 2/4 Seated rest: HR 93 SpO2 96% dyspnea 1/4 TUG with RW HR99 SpO2 94% dyspnea 2/4 Seated rest: HR 94 SpO2 95% dyspnea 1/4 30' gait with SBQC HR 100 SpO2 92% dyspnea 3/4  Visit Diagnosis:  Abnormality of gait  Weakness due to cerebrovascular accident  Unsteadiness  Balance problems  Decreased functional activity tolerance  Dyspnea      Subjective Assessment - 01/08/16 0908    Subjective This 82yo male had shortness of breath on 12/12/2015 and underwent a cardiac catherization in ED that showed aortic stenosis same as last study. He was discharged home with some numbness in both feet. On 12/13/2015 he awoke with right side weakness and fell trying to get out of bed. He was diagnosed with Left CVA with right hemiplegia on 12/13/2015 with acute hospitialization, transferred to Rehab 12/15/2015 with discharge 12/29/2015. He presents to [facility] for PT & OT evaluations with daughter & granddaughter.    Patient is accompained by: Family member   Patient Stated Goals Patient would like to live alone again, improve balance & gait, return to gym   Currently in Pain? No/denies  [facility] PT Assessment - 01/08/16 0914    Assessment   Medical Diagnosis CVA Right hemiplegia   Referring Provider Alysia Penna, MD   Onset Date/Surgical Date 12/13/15   Hand Dominance Right   Prior Therapy Inpatient Rehab 12/15/2015-12/29/2015   Precautions   Precautions Fall   Restrictions   Weight Bearing Restrictions No   Balance Screen   Has the patient fallen in the past 6 months Yes   How many times? 1  morning of CVA with weakness   Has the patient had a decrease in activity level because of a fear of falling?  No   Is the patient reluctant to leave their home because of a fear of falling?  No   Home Film/video editor residence   Living Arrangements Alone;Children  lived alone prior to CVA, adult dtr living with him now   Available Help at Discharge Family;Available 24 hours/day   Type of Home House   Home Access Stairs to enter   Entrance Stairs-Number of Steps 2   Entrance Stairs-Rails Left  right grab bar   Home Layout One level   [place] - 2 wheels;Cane - quad;Tub bench   Prior Function   Level of Independence Independent;Independent with household mobility without device;Independent with community mobility without device;Independent with gait;Independent with transfers   Vocation Retired   Observation/Other Assessments   Focus on Therapeutic Outcomes (FOTO)  49 Functional Status  47 Risk Adjusted   Stroke Impact Scale  52.8%   Fear Avoidance Belief Questionnaire (FABQ)  98 (24)   Posture/Postural Control   Posture/Postural Control Postural limitations   Postural Limitations Rounded Shoulders;Forward head;Flexed trunk  leans trunk right   ROM / Strength   AROM / PROM / Strength AROM;Strength   AROM   Overall AROM  Within functional limits for tasks performed   Strength   Strength Assessment Site Hip;Knee;Ankle   Right/Left Hip Right;Left   Right Hip Flexion 5/5   Right Hip Extension 3+/5   Right Hip ABduction 4/5   Left Hip Flexion 3-/5   Left Hip Extension 3-/5   Left Hip External Rotation 3-/5   Left Hip ABduction 3-/5   Right/Left Knee Right;Left   Right Knee Flexion 3-/5   Right Knee Extension 3+/5   Left Knee Flexion 4/5   Left Knee Extension 5/5   Right/Left Ankle Right;Left   Right Ankle Dorsiflexion 2+/5   Right Ankle Plantar Flexion 2-/5   Left Ankle Dorsiflexion 5/5   Transfers   Transfers Sit to Stand;Stand to Sit   Sit to Stand 5: Supervision;Without upper extremity assist;From chair/3-in-1;Multiple attempts   Stand to Sit 5: Supervision;With upper extremity assist;To bed   Ambulation/Gait   Ambulation/Gait Yes   Ambulation/Gait Assistance  3: Mod assist;5: Supervision  ModA SBQC, SBA with RW   Ambulation Distance (Feet) 100 Feet  100' X 3 with RW, 10' & 30' with [facility]   Assistive device Rolling walker;Small based quad cane  toe-up foot orthosis   Gait Pattern Step-to pattern;Decreased stance time - right;Decreased step length - left;Decreased stride length;Decreased hip/knee flexion - right;Decreased dorsiflexion - right;Right foot flat;Right flexed knee in stance;Lateral hip instability;Trunk flexed;Poor foot clearance - right   Ambulation Surface Indoor;Level   Gait velocity 0.87 ft/sec   Standardized Balance Assessment   Standardized Balance Assessment Berg Balance Test;Timed Up and Go Test   Berg Balance Test   Sit to Stand Able to stand  independently using hands   Standing  Unsupported Able to stand 2 minutes with supervision   Sitting with Back Unsupported but Feet Supported on Floor or Stool Able to sit safely and securely 2 minutes   Stand to Sit Controls descent by using hands   Transfers Able to transfer safely, definite need of hands   Standing Unsupported with Eyes Closed Able to stand 10 seconds with supervision   Standing Ubsupported with Feet Together Able to place feet together independently and stand for 1 minute with supervision   From Standing, Reach Forward with Outstretched Arm Reaches forward but needs supervision   From Standing Position, Pick up Object from [place] to pick up shoe, needs supervision   From Standing Position, Turn to Look Behind Over each Shoulder Needs supervision when turning   Turn 360 Degrees Needs assistance while turning   Standing Unsupported, Alternately Place Feet on Step/Stool Needs assistance to keep from falling or unable to try   Standing Unsupported, One Foot in [place] balance while stepping or standing   Standing on One Leg Unable to try or needs assist to prevent fall   Total Score 27   Timed Up and Go Test   Normal TUG (seconds) 44.11  rolling walker                              PT Short Term Goals - 01/08/16 1015    PT SHORT TERM GOAL #1   Title Patient demonstrates understanding of initial HEP (Target Date: 02/09/2016)   Time 1   Period Months   Status New   PT SHORT TERM GOAL #2   Title Patient ambulates 200' with rolling walker modified independent.  (Target Date: 02/09/2016)   Time 1   Period Months   Status New   PT SHORT TERM GOAL #3   Title Patient negotiates ramps & curbs with rolling walker and stairs with 2 rails with supervision.  (Target Date: 02/09/2016)   Time 1   Period Months   Status New   PT SHORT TERM GOAL #4   Title Berg Balance >32/56 to indicate lower fall risk (Target Date: 02/09/2016)   Time 1   Period Months   Status New   PT SHORT TERM GOAL #5   Title TImed Up & Go with rolling walker <35 seconds to indicate lower fall risk.  (Target Date: 02/09/2016)   Time 1   Period Months   Status New           PT Long Term Goals - 01/08/16 1015    PT LONG TERM GOAL #1   Title Patient demonstrates / verbalizes understanding of ongoing exercise program including gym  activities.  (Target Date: 03/08/2016)   Time 2   Period Months   Status New   PT LONG TERM GOAL #2   Title Patient ambulates 250' with SBQC modified independent for community mobility.  (Target Date: 03/08/2016)   Time 2   Period Months   Status New   PT LONG TERM GOAL #3   Title Patient negotiates ramps, curbs & stairs (1 rail) with SBQC modified independent.  (Target Date: 03/08/2016)   Time 2   Period Months   Status New   PT LONG TERM GOAL #4   Title Berg Balance >36/56 to indicate lower fall risk.  (Target Date: 03/08/2016)   Time 2   Period Months   Status New   PT LONG TERM GOAL #5   Title  Timed Up & Go with SBQC <30sec to indicate lower fall risk.  (Target Date: 03/08/2016)   Time 2   Period Months   Status New               Plan - 2016-02-05 1015    Clinical Impression Statement This 82yo lived alone  and has daughter living with him since CVA. He has co-morbities of HTN, DM, CAD w/CABG, aortic stenosis, COPD. CVA on 12/13/2015 resulted in right side hemiplegia with significant weakness in right LE impacting mobility & function. Patient has dyspnea with activity. Berg Balance of 27/56, Timed Up -Go of 44.11sec and Gait Velocity of 0.24ft/sec all indicate high fall risk. His gait is dependent & unsafe. Functional Status of 49  and Fear of 98% indicate limitation. Patient would benefit from skilled care to improve safety and mobility. Patient's condition is unstable & moderate complexity for plan of care.    Pt will benefit from skilled therapeutic intervention in order to improve on the following deficits Abnormal gait;Decreased activity tolerance;Decreased balance;Decreased endurance;Decreased knowledge of use of DME;Decreased mobility;Decreased strength;Postural dysfunction   Rehab Potential Good   PT Frequency 3x / week   PT Duration --  9 weeks (60 days)   PT Treatment/Interventions ADLs/Self Care Home Management;DME Instruction;Gait training;Stair training;Functional mobility training;Therapeutic activities;Therapeutic exercise;Balance training;Neuromuscular re-education;Patient/family education;Orthotic Fit/Training   PT Next Visit Plan Instruct in HEP, balance & gait    Consulted and Agree with Plan of Care Patient;Family member/caregiver   Family Member Consulted dtr & adult granddtr          G-Codes - February 05, 2016 1015    Functional Assessment Tool Used Timed Up Go with RW 44.11sec   Functional Limitation Mobility: Walking and moving around   Mobility: Walking and Moving Around Current Status [phone]) At least 80 percent but less than 100 percent impaired, limited or restricted   Mobility: Walking and Moving Around Goal Status [phone]) At least 40 percent but less than 60 percent impaired, limited or restricted       Problem List Patient Active Problem List   Diagnosis Date Noted  .  Stroke, acute, thrombotic ([place]) 12/15/2015  . Hemiparesis affecting dominant side as late effect of stroke ([place]) 12/15/2015  . Anemia 12/15/2015  . Spastic neurogenic bladder 12/15/2015  . GERD (gastroesophageal reflux disease) 12/15/2015  . Hypokalemia 12/15/2015  . Cerebral infarction due to unspecified mechanism   . CVA (cerebral infarction) 12/13/2015  . Prolonged Q-T interval on ECG 12/13/2015  . Right sided weakness 12/13/2015  . Elevated troponin 12/13/2015  . Acute kidney failure ([place]) 12/13/2015  . Normocytic anemia 12/13/2015  . Diabetes mellitus with complication ([place])   . CAD in native artery   . Aortic stenosis   . OSA (obstructive sleep apnea) 02/08/2015  . COPD, moderate ([place]) 01/12/2015  . COPD (chronic obstructive pulmonary disease) ([place]) 11/10/2014  . Nocturnal hypoxia 11/10/2014  . Dyspnea and respiratory abnormality 10/19/2014  . Aortic valve stenosis 05/27/2014  . CAD (coronary artery disease) of artery bypass graft 11/30/2013  . CAD (coronary artery disease) 11/30/2013  . Hyperlipidemia with target LDL less than 70 11/30/2013  . Essential hypertension 11/30/2013  . DM2 (diabetes mellitus, type 2) ([place]) 11/30/2013  . RBBB 11/30/2013    Matis Monnier PT, DPT Feb 05, 2016, 8:22 PM  [facility] [address] [place], [place], [address] Phone: [phone]   Fax:  [phone]  Name: Douglas Carrillo MRN: [id number] Date of Birth: 02-Jun-1928

## 2016-01-09 ENCOUNTER — Encounter (HOSPITAL_COMMUNITY): Payer: Self-pay | Admitting: Emergency Medicine

## 2016-01-09 ENCOUNTER — Other Ambulatory Visit: Payer: Self-pay

## 2016-01-09 ENCOUNTER — Ambulatory Visit: Payer: Medicare Other | Admitting: Physical Therapy

## 2016-01-09 ENCOUNTER — Emergency Department (HOSPITAL_COMMUNITY): Payer: Medicare Other

## 2016-01-09 ENCOUNTER — Inpatient Hospital Stay (HOSPITAL_COMMUNITY)
Admission: EM | Admit: 2016-01-09 | Discharge: 2016-01-16 | DRG: 871 | Disposition: A | Discharge status: Skilled Nursing Facility | Payer: Medicare Other | Attending: Internal Medicine | Admitting: Internal Medicine

## 2016-01-09 DIAGNOSIS — J9610 Chronic respiratory failure, unspecified whether with hypoxia or hypercapnia: Secondary | ICD-10-CM | POA: Diagnosis not present

## 2016-01-09 DIAGNOSIS — J9600 Acute respiratory failure, unspecified whether with hypoxia or hypercapnia: Secondary | ICD-10-CM | POA: Diagnosis present

## 2016-01-09 DIAGNOSIS — I10 Essential (primary) hypertension: Secondary | ICD-10-CM | POA: Diagnosis not present

## 2016-01-09 DIAGNOSIS — K219 Gastro-esophageal reflux disease without esophagitis: Secondary | ICD-10-CM | POA: Diagnosis present

## 2016-01-09 DIAGNOSIS — I272 Other secondary pulmonary hypertension: Secondary | ICD-10-CM | POA: Diagnosis present

## 2016-01-09 DIAGNOSIS — E7800 Pure hypercholesterolemia, unspecified: Secondary | ICD-10-CM | POA: Diagnosis present

## 2016-01-09 DIAGNOSIS — N189 Chronic kidney disease, unspecified: Secondary | ICD-10-CM | POA: Diagnosis not present

## 2016-01-09 DIAGNOSIS — A4102 Sepsis due to Methicillin resistant Staphylococcus aureus: Secondary | ICD-10-CM

## 2016-01-09 DIAGNOSIS — Z9981 Dependence on supplemental oxygen: Secondary | ICD-10-CM

## 2016-01-09 DIAGNOSIS — Z951 Presence of aortocoronary bypass graft: Secondary | ICD-10-CM

## 2016-01-09 DIAGNOSIS — Z888 Allergy status to other drugs, medicaments and biological substances status: Secondary | ICD-10-CM | POA: Diagnosis not present

## 2016-01-09 DIAGNOSIS — I2510 Atherosclerotic heart disease of native coronary artery without angina pectoris: Secondary | ICD-10-CM | POA: Diagnosis present

## 2016-01-09 DIAGNOSIS — I350 Nonrheumatic aortic (valve) stenosis: Secondary | ICD-10-CM | POA: Diagnosis present

## 2016-01-09 DIAGNOSIS — I248 Other forms of acute ischemic heart disease: Secondary | ICD-10-CM | POA: Diagnosis present

## 2016-01-09 DIAGNOSIS — Z841 Family history of disorders of kidney and ureter: Secondary | ICD-10-CM | POA: Diagnosis not present

## 2016-01-09 DIAGNOSIS — Y95 Nosocomial condition: Secondary | ICD-10-CM | POA: Diagnosis present

## 2016-01-09 DIAGNOSIS — E118 Type 2 diabetes mellitus with unspecified complications: Secondary | ICD-10-CM | POA: Diagnosis not present

## 2016-01-09 DIAGNOSIS — R5381 Other malaise: Secondary | ICD-10-CM | POA: Diagnosis not present

## 2016-01-09 DIAGNOSIS — Z8546 Personal history of malignant neoplasm of prostate: Secondary | ICD-10-CM | POA: Diagnosis not present

## 2016-01-09 DIAGNOSIS — Z22322 Carrier or suspected carrier of Methicillin resistant Staphylococcus aureus: Secondary | ICD-10-CM | POA: Diagnosis not present

## 2016-01-09 DIAGNOSIS — J440 Chronic obstructive pulmonary disease with acute lower respiratory infection: Secondary | ICD-10-CM | POA: Diagnosis present

## 2016-01-09 DIAGNOSIS — I25810 Atherosclerosis of coronary artery bypass graft(s) without angina pectoris: Secondary | ICD-10-CM | POA: Diagnosis present

## 2016-01-09 DIAGNOSIS — A4101 Sepsis due to Methicillin susceptible Staphylococcus aureus: Principal | ICD-10-CM | POA: Diagnosis present

## 2016-01-09 DIAGNOSIS — J449 Chronic obstructive pulmonary disease, unspecified: Secondary | ICD-10-CM

## 2016-01-09 DIAGNOSIS — Z79899 Other long term (current) drug therapy: Secondary | ICD-10-CM

## 2016-01-09 DIAGNOSIS — I25700 Atherosclerosis of coronary artery bypass graft(s), unspecified, with unstable angina pectoris: Secondary | ICD-10-CM | POA: Diagnosis not present

## 2016-01-09 DIAGNOSIS — I130 Hypertensive heart and chronic kidney disease with heart failure and stage 1 through stage 4 chronic kidney disease, or unspecified chronic kidney disease: Secondary | ICD-10-CM | POA: Diagnosis present

## 2016-01-09 DIAGNOSIS — I5033 Acute on chronic diastolic (congestive) heart failure: Secondary | ICD-10-CM | POA: Diagnosis present

## 2016-01-09 DIAGNOSIS — J189 Pneumonia, unspecified organism: Secondary | ICD-10-CM

## 2016-01-09 DIAGNOSIS — Z8673 Personal history of transient ischemic attack (TIA), and cerebral infarction without residual deficits: Secondary | ICD-10-CM

## 2016-01-09 DIAGNOSIS — N182 Chronic kidney disease, stage 2 (mild): Secondary | ICD-10-CM | POA: Diagnosis present

## 2016-01-09 DIAGNOSIS — Z7902 Long term (current) use of antithrombotics/antiplatelets: Secondary | ICD-10-CM

## 2016-01-09 DIAGNOSIS — E1122 Type 2 diabetes mellitus with diabetic chronic kidney disease: Secondary | ICD-10-CM | POA: Diagnosis present

## 2016-01-09 DIAGNOSIS — A4901 Methicillin susceptible Staphylococcus aureus infection, unspecified site: Secondary | ICD-10-CM | POA: Diagnosis not present

## 2016-01-09 DIAGNOSIS — I351 Nonrheumatic aortic (valve) insufficiency: Secondary | ICD-10-CM | POA: Diagnosis not present

## 2016-01-09 DIAGNOSIS — Z889 Allergy status to unspecified drugs, medicaments and biological substances status: Secondary | ICD-10-CM

## 2016-01-09 DIAGNOSIS — A403 Sepsis due to Streptococcus pneumoniae: Secondary | ICD-10-CM | POA: Diagnosis not present

## 2016-01-09 DIAGNOSIS — Z794 Long term (current) use of insulin: Secondary | ICD-10-CM

## 2016-01-09 DIAGNOSIS — E1100 Type 2 diabetes mellitus with hyperosmolarity without nonketotic hyperglycemic-hyperosmolar coma (NKHHC): Secondary | ICD-10-CM

## 2016-01-09 DIAGNOSIS — R7989 Other specified abnormal findings of blood chemistry: Secondary | ICD-10-CM

## 2016-01-09 DIAGNOSIS — A419 Sepsis, unspecified organism: Secondary | ICD-10-CM | POA: Diagnosis present

## 2016-01-09 DIAGNOSIS — G8191 Hemiplegia, unspecified affecting right dominant side: Secondary | ICD-10-CM | POA: Diagnosis not present

## 2016-01-09 DIAGNOSIS — D649 Anemia, unspecified: Secondary | ICD-10-CM | POA: Diagnosis present

## 2016-01-09 DIAGNOSIS — J9621 Acute and chronic respiratory failure with hypoxia: Secondary | ICD-10-CM | POA: Diagnosis present

## 2016-01-09 DIAGNOSIS — I471 Supraventricular tachycardia: Secondary | ICD-10-CM | POA: Diagnosis not present

## 2016-01-09 DIAGNOSIS — Z7982 Long term (current) use of aspirin: Secondary | ICD-10-CM

## 2016-01-09 DIAGNOSIS — G4733 Obstructive sleep apnea (adult) (pediatric): Secondary | ICD-10-CM | POA: Diagnosis present

## 2016-01-09 DIAGNOSIS — J9601 Acute respiratory failure with hypoxia: Secondary | ICD-10-CM | POA: Insufficient documentation

## 2016-01-09 DIAGNOSIS — Z87891 Personal history of nicotine dependence: Secondary | ICD-10-CM

## 2016-01-09 DIAGNOSIS — E119 Type 2 diabetes mellitus without complications: Secondary | ICD-10-CM

## 2016-01-09 DIAGNOSIS — A4902 Methicillin resistant Staphylococcus aureus infection, unspecified site: Secondary | ICD-10-CM | POA: Diagnosis not present

## 2016-01-09 DIAGNOSIS — R1310 Dysphagia, unspecified: Secondary | ICD-10-CM | POA: Diagnosis not present

## 2016-01-09 DIAGNOSIS — I25709 Atherosclerosis of coronary artery bypass graft(s), unspecified, with unspecified angina pectoris: Secondary | ICD-10-CM | POA: Diagnosis not present

## 2016-01-09 DIAGNOSIS — E785 Hyperlipidemia, unspecified: Secondary | ICD-10-CM | POA: Diagnosis present

## 2016-01-09 DIAGNOSIS — R778 Other specified abnormalities of plasma proteins: Secondary | ICD-10-CM

## 2016-01-09 DIAGNOSIS — J690 Pneumonitis due to inhalation of food and vomit: Secondary | ICD-10-CM | POA: Diagnosis present

## 2016-01-09 DIAGNOSIS — R918 Other nonspecific abnormal finding of lung field: Secondary | ICD-10-CM | POA: Diagnosis not present

## 2016-01-09 DIAGNOSIS — I639 Cerebral infarction, unspecified: Secondary | ICD-10-CM | POA: Diagnosis present

## 2016-01-09 HISTORY — DX: Pneumonia, unspecified organism: J18.9

## 2016-01-09 HISTORY — DX: Cerebral infarction, unspecified: I63.9

## 2016-01-09 LAB — URINE MICROSCOPIC-ADD ON
Bacteria, UA: NONE SEEN
RBC / HPF: NONE SEEN RBC/hpf (ref 0–5)
SQUAMOUS EPITHELIAL / LPF: NONE SEEN

## 2016-01-09 LAB — I-STAT ARTERIAL BLOOD GAS, ED
Acid-base deficit: 2 mmol/L (ref 0.0–2.0)
BICARBONATE: 19.8 meq/L — AB (ref 20.0–24.0)
O2 Saturation: 95 %
TCO2: 20 mmol/L (ref 0–100)
pCO2 arterial: 27.4 mmHg — ABNORMAL LOW (ref 35.0–45.0)
pH, Arterial: 7.475 — ABNORMAL HIGH (ref 7.350–7.450)
pO2, Arterial: 79 mmHg — ABNORMAL LOW (ref 80.0–100.0)

## 2016-01-09 LAB — CBC WITH DIFFERENTIAL/PLATELET
BASOS ABS: 0 10*3/uL (ref 0.0–0.1)
Basophils Relative: 0 %
EOS PCT: 0 %
Eosinophils Absolute: 0 10*3/uL (ref 0.0–0.7)
HCT: 31.9 % — ABNORMAL LOW (ref 39.0–52.0)
Hemoglobin: 10.3 g/dL — ABNORMAL LOW (ref 13.0–17.0)
Lymphocytes Relative: 9 %
Lymphs Abs: 0.6 10*3/uL — ABNORMAL LOW (ref 0.7–4.0)
MCH: 27.5 pg (ref 26.0–34.0)
MCHC: 32.3 g/dL (ref 30.0–36.0)
MCV: 85.1 fL (ref 78.0–100.0)
Monocytes Absolute: 0.3 10*3/uL (ref 0.1–1.0)
Monocytes Relative: 5 %
NEUTROS PCT: 86 %
Neutro Abs: 6.3 10*3/uL (ref 1.7–7.7)
PLATELETS: 271 10*3/uL (ref 150–400)
RBC: 3.75 MIL/uL — AB (ref 4.22–5.81)
RDW: 17.1 % — ABNORMAL HIGH (ref 11.5–15.5)
WBC: 7.2 10*3/uL (ref 4.0–10.5)

## 2016-01-09 LAB — I-STAT CG4 LACTIC ACID, ED
Lactic Acid, Venous: 2.54 mmol/L (ref 0.5–2.0)
Lactic Acid, Venous: 1.45 mmol/L (ref 0.5–2.0)

## 2016-01-09 LAB — URINALYSIS, ROUTINE W REFLEX MICROSCOPIC
BILIRUBIN URINE: NEGATIVE
HGB URINE DIPSTICK: NEGATIVE
KETONES UR: 15 mg/dL — AB
LEUKOCYTES UA: NEGATIVE
Nitrite: NEGATIVE
PROTEIN: 30 mg/dL — AB
Specific Gravity, Urine: 1.028 (ref 1.005–1.030)
pH: 5.5 (ref 5.0–8.0)

## 2016-01-09 LAB — COMPREHENSIVE METABOLIC PANEL
ALK PHOS: 73 U/L (ref 38–126)
ALT: 20 U/L (ref 17–63)
ANION GAP: 10 (ref 5–15)
AST: 21 U/L (ref 15–41)
Albumin: 2.9 g/dL — ABNORMAL LOW (ref 3.5–5.0)
BUN: 21 mg/dL — ABNORMAL HIGH (ref 6–20)
CALCIUM: 9.4 mg/dL (ref 8.9–10.3)
CHLORIDE: 103 mmol/L (ref 101–111)
CO2: 20 mmol/L — AB (ref 22–32)
Creatinine, Ser: 1.31 mg/dL — ABNORMAL HIGH (ref 0.61–1.24)
GFR calc non Af Amer: 48 mL/min — ABNORMAL LOW (ref >60)
GFR, EST AFRICAN AMERICAN: 56 mL/min — AB (ref >60)
Glucose, Bld: 325 mg/dL — ABNORMAL HIGH (ref 65–99)
POTASSIUM: 4.4 mmol/L (ref 3.5–5.1)
SODIUM: 133 mmol/L — AB (ref 135–145)
Total Bilirubin: 1 mg/dL (ref 0.3–1.2)
Total Protein: 7.3 g/dL (ref 6.5–8.1)

## 2016-01-09 LAB — INFLUENZA PANEL BY PCR (TYPE A & B)
H1N1 flu by pcr: NOT DETECTED
Influenza A By PCR: NEGATIVE
Influenza B By PCR: NEGATIVE

## 2016-01-09 LAB — PROTIME-INR
INR: 1.48 (ref 0.00–1.49)
PROTHROMBIN TIME: 18 s — AB (ref 11.6–15.2)

## 2016-01-09 LAB — BRAIN NATRIURETIC PEPTIDE: B Natriuretic Peptide: 338.3 pg/mL — ABNORMAL HIGH (ref 0.0–100.0)

## 2016-01-09 LAB — MRSA PCR SCREENING: MRSA BY PCR: POSITIVE — AB

## 2016-01-09 LAB — APTT: APTT: 24 s (ref 24–37)

## 2016-01-09 LAB — TROPONIN I
TROPONIN I: 0.07 ng/mL — AB (ref <0.031)
Troponin I: 0.11 ng/mL — ABNORMAL HIGH (ref <0.031)
Troponin I: 1.86 ng/mL (ref <0.031)

## 2016-01-09 LAB — PROCALCITONIN: PROCALCITONIN: 5.41 ng/mL

## 2016-01-09 LAB — GLUCOSE, CAPILLARY
GLUCOSE-CAPILLARY: 337 mg/dL — AB (ref 65–99)
GLUCOSE-CAPILLARY: 314 mg/dL — AB (ref 65–99)
Glucose-Capillary: 450 mg/dL — ABNORMAL HIGH (ref 65–99)
Glucose-Capillary: 397 mg/dL — ABNORMAL HIGH (ref 65–99)

## 2016-01-09 MED ORDER — SODIUM CHLORIDE 0.9 % IV SOLN
INTRAVENOUS | Status: DC
  Administered 2016-01-09: 50 mL/h via INTRAVENOUS
  Administered 2016-01-11 – 2016-01-16 (×5): via INTRAVENOUS

## 2016-01-09 MED ORDER — RESOURCE THICKENUP CLEAR PO POWD
ORAL | Status: DC | PRN
  Filled 2016-01-09: qty 125

## 2016-01-09 MED ORDER — DEXTROSE 5 % IV SOLN
2.0000 g | INTRAVENOUS | Status: DC
  Administered 2016-01-10 – 2016-01-11 (×2): 2 g via INTRAVENOUS
  Filled 2016-01-09 (×2): qty 2

## 2016-01-09 MED ORDER — IPRATROPIUM-ALBUTEROL 0.5-2.5 (3) MG/3ML IN SOLN: 3.0000 mL | RESPIRATORY_TRACT | Status: DC

## 2016-01-09 MED ORDER — VANCOMYCIN HCL IN DEXTROSE 1-5 GM/200ML-% IV SOLN
1000.0000 mg | Freq: Once | INTRAVENOUS | Status: DC
  Filled 2016-01-09: qty 200

## 2016-01-09 MED ORDER — ACETAMINOPHEN 650 MG RE SUPP: 650.0000 mg | Freq: Four times a day (QID) | RECTAL | Status: DC | PRN

## 2016-01-09 MED ORDER — CLOPIDOGREL BISULFATE 75 MG PO TABS
75.0000 mg | ORAL_TABLET | Freq: Every day | ORAL | Status: DC
  Administered 2016-01-09 – 2016-01-16 (×8): 75 mg via ORAL
  Filled 2016-01-09 (×8): qty 1

## 2016-01-09 MED ORDER — SODIUM CHLORIDE 0.9 % IV SOLN
1250.0000 mg | INTRAVENOUS | Status: DC
  Administered 2016-01-10 – 2016-01-11 (×2): 1250 mg via INTRAVENOUS
  Filled 2016-01-09 (×2): qty 1250

## 2016-01-09 MED ORDER — KETOTIFEN FUMARATE 0.025 % OP SOLN
1.0000 [drp] | Freq: Two times a day (BID) | OPHTHALMIC | Status: DC
  Administered 2016-01-09 – 2016-01-16 (×14): 1 [drp] via OPHTHALMIC
  Filled 2016-01-09: qty 5

## 2016-01-09 MED ORDER — VANCOMYCIN HCL 10 G IV SOLR
1500.0000 mg | Freq: Once | INTRAVENOUS | Status: AC
  Administered 2016-01-09: 1500 mg via INTRAVENOUS
  Filled 2016-01-09: qty 1500

## 2016-01-09 MED ORDER — INSULIN ASPART 100 UNIT/ML ~~LOC~~ SOLN
0.0000 [IU] | Freq: Every day | SUBCUTANEOUS | Status: DC
  Administered 2016-01-09: 4 [IU] via SUBCUTANEOUS
  Administered 2016-01-10 – 2016-01-12 (×3): 2 [IU] via SUBCUTANEOUS

## 2016-01-09 MED ORDER — ASPIRIN 81 MG PO TABS: 81.0000 mg | ORAL_TABLET | Freq: Every day | ORAL | Status: DC

## 2016-01-09 MED ORDER — ALBUTEROL SULFATE (2.5 MG/3ML) 0.083% IN NEBU
3.0000 mL | INHALATION_SOLUTION | RESPIRATORY_TRACT | Status: DC | PRN
  Administered 2016-01-12 – 2016-01-14 (×2): 3 mL via RESPIRATORY_TRACT
  Filled 2016-01-09 (×2): qty 3

## 2016-01-09 MED ORDER — VANCOMYCIN HCL 10 G IV SOLR
1750.0000 mg | Freq: Once | INTRAVENOUS | Status: DC
  Filled 2016-01-09: qty 1750

## 2016-01-09 MED ORDER — OXYBUTYNIN CHLORIDE ER 5 MG PO TB24
5.0000 mg | ORAL_TABLET | Freq: Every day | ORAL | Status: DC
  Administered 2016-01-09 – 2016-01-16 (×8): 5 mg via ORAL
  Filled 2016-01-09 (×8): qty 1

## 2016-01-09 MED ORDER — SODIUM CHLORIDE 0.9 % IV BOLUS (SEPSIS)
1000.0000 mL | INTRAVENOUS | Status: AC
  Administered 2016-01-09 (×2): 1000 mL via INTRAVENOUS

## 2016-01-09 MED ORDER — ONDANSETRON HCL 4 MG PO TABS: 4.0000 mg | ORAL_TABLET | Freq: Four times a day (QID) | ORAL | Status: DC | PRN

## 2016-01-09 MED ORDER — ROSUVASTATIN CALCIUM 20 MG PO TABS
20.0000 mg | ORAL_TABLET | Freq: Every day | ORAL | Status: DC
  Administered 2016-01-09 – 2016-01-16 (×8): 20 mg via ORAL
  Filled 2016-01-09 (×8): qty 1

## 2016-01-09 MED ORDER — LATANOPROST 0.005 % OP SOLN
1.0000 [drp] | Freq: Every day | OPHTHALMIC | Status: DC
Start: 2016-01-09 — End: 2016-01-17
  Administered 2016-01-09 – 2016-01-16 (×8): 1 [drp] via OPHTHALMIC
  Filled 2016-01-09: qty 2.5

## 2016-01-09 MED ORDER — CYCLOSPORINE 0.05 % OP EMUL
1.0000 [drp] | Freq: Two times a day (BID) | OPHTHALMIC | Status: DC
  Administered 2016-01-09 – 2016-01-16 (×15): 1 [drp] via OPHTHALMIC
  Filled 2016-01-09 (×15): qty 1

## 2016-01-09 MED ORDER — IPRATROPIUM-ALBUTEROL 0.5-2.5 (3) MG/3ML IN SOLN
3.0000 mL | RESPIRATORY_TRACT | Status: DC
  Administered 2016-01-09 (×3): 3 mL via RESPIRATORY_TRACT
  Filled 2016-01-09 (×3): qty 3

## 2016-01-09 MED ORDER — INSULIN ASPART 100 UNIT/ML ~~LOC~~ SOLN
18.0000 [IU] | Freq: Once | SUBCUTANEOUS | Status: AC
  Administered 2016-01-09: 18 [IU] via SUBCUTANEOUS

## 2016-01-09 MED ORDER — INSULIN ASPART 100 UNIT/ML ~~LOC~~ SOLN
0.0000 [IU] | Freq: Three times a day (TID) | SUBCUTANEOUS | Status: DC
  Administered 2016-01-09: 11 [IU] via SUBCUTANEOUS
  Administered 2016-01-10 (×2): 8 [IU] via SUBCUTANEOUS
  Administered 2016-01-10: 11 [IU] via SUBCUTANEOUS
  Administered 2016-01-11: 3 [IU] via SUBCUTANEOUS
  Administered 2016-01-11: 11 [IU] via SUBCUTANEOUS
  Administered 2016-01-11: 3 [IU] via SUBCUTANEOUS
  Administered 2016-01-12: 8 [IU] via SUBCUTANEOUS
  Administered 2016-01-12: 5 [IU] via SUBCUTANEOUS
  Administered 2016-01-12: 2 [IU] via SUBCUTANEOUS
  Administered 2016-01-13: 3 [IU] via SUBCUTANEOUS
  Administered 2016-01-13: 5 [IU] via SUBCUTANEOUS
  Administered 2016-01-13: 3 [IU] via SUBCUTANEOUS
  Administered 2016-01-14 (×2): 2 [IU] via SUBCUTANEOUS
  Administered 2016-01-14 – 2016-01-15 (×3): 3 [IU] via SUBCUTANEOUS
  Administered 2016-01-15: 2 [IU] via SUBCUTANEOUS
  Administered 2016-01-16 (×3): 3 [IU] via SUBCUTANEOUS

## 2016-01-09 MED ORDER — ONDANSETRON HCL 4 MG/2ML IJ SOLN: 4.0000 mg | Freq: Four times a day (QID) | INTRAMUSCULAR | Status: DC | PRN

## 2016-01-09 MED ORDER — IPRATROPIUM-ALBUTEROL 0.5-2.5 (3) MG/3ML IN SOLN
3.0000 mL | Freq: Four times a day (QID) | RESPIRATORY_TRACT | Status: DC
  Administered 2016-01-10: 3 mL via RESPIRATORY_TRACT
  Filled 2016-01-09: qty 3

## 2016-01-09 MED ORDER — METOPROLOL SUCCINATE ER 50 MG PO TB24
50.0000 mg | ORAL_TABLET | Freq: Every day | ORAL | Status: DC
Start: 2016-01-09 — End: 2016-01-13
  Administered 2016-01-09 – 2016-01-13 (×5): 50 mg via ORAL
  Filled 2016-01-09 (×5): qty 1

## 2016-01-09 MED ORDER — DEXTROSE 5 % IV SOLN
2.0000 g | Freq: Once | INTRAVENOUS | Status: AC
  Administered 2016-01-09: 2 g via INTRAVENOUS
  Filled 2016-01-09: qty 2

## 2016-01-09 MED ORDER — ASPIRIN 81 MG PO CHEW
81.0000 mg | CHEWABLE_TABLET | Freq: Every day | ORAL | Status: DC
  Administered 2016-01-09 – 2016-01-16 (×8): 81 mg via ORAL
  Filled 2016-01-09 (×8): qty 1

## 2016-01-09 MED ORDER — PANTOPRAZOLE SODIUM 40 MG PO TBEC
80.0000 mg | DELAYED_RELEASE_TABLET | Freq: Every day | ORAL | Status: DC
  Administered 2016-01-09 – 2016-01-16 (×8): 80 mg via ORAL
  Filled 2016-01-09 (×9): qty 2

## 2016-01-09 MED ORDER — HYDROCODONE-ACETAMINOPHEN 5-325 MG PO TABS: 1.0000 | ORAL_TABLET | ORAL | Status: DC | PRN

## 2016-01-09 MED ORDER — BENZONATATE 100 MG PO CAPS
100.0000 mg | ORAL_CAPSULE | Freq: Three times a day (TID) | ORAL | Status: DC
  Administered 2016-01-09 – 2016-01-16 (×22): 100 mg via ORAL
  Filled 2016-01-09 (×23): qty 1

## 2016-01-09 MED ORDER — ACETAMINOPHEN 325 MG PO TABS
650.0000 mg | ORAL_TABLET | Freq: Four times a day (QID) | ORAL | Status: DC | PRN
Start: 2016-01-09 — End: 2016-01-17
  Administered 2016-01-12: 650 mg via ORAL
  Filled 2016-01-09: qty 2

## 2016-01-09 NOTE — H&P (Signed)
[facility] History and Physical  Douglas Carrillo [id number] DOB: Mar 05, 1928 DOA: 01/09/2016  Referring physician: Dayna Barker PCP: Limmie Patricia, MD   Chief Complaint: sob/cough/weakness/fever  HPI: Douglas Carrillo is a very pleasant 82 y.o. male with a past medical history that includes hypertension, diabetes, SA on oxygen at night, severe aortic stenosis and most recently CVA since emergency department with a persistent gradual worsening shortness of breath/cough and fever. Initial evaluation reveals sepsis and acute respiratory failure secondary to pneumonia.  Patient reports he has been home from hospital for 10 days and has been coughing the entire time. Went to see [place] PCP and was provided with IM steroids and by mouth steroid taper which he finished. Reports today cough has become worse it sounds wet but is nonproductive. Associated symptoms include fever decreased appetite generalized weakness. He denies chest pain palpitations abdominal pain nausea vomiting diarrhea. He denies dysuria hematuria frequency or urgency. He denies lower extremity edema or orthopnea.  In the emergency department code sepsis is called he receives antibiotics IV fluids per protocol This provided with nebs en route and oxygen supplementation   Review of Systems:  10 point review of systems complete and all systems are negative except as indicated in history of present illness  Past Medical History  Diagnosis Date  . Hypertension   . High cholesterol   . Prostate cancer ([place])   . CAD (coronary artery disease)   . OSA (obstructive sleep apnea)   . COPD (chronic obstructive pulmonary disease) (HCC)     MODERATE  . Diabetes mellitus ([place])   . GERD (gastroesophageal reflux disease)   . Vertigo   . Arthritis     OA  . Anemia   . CHF (congestive heart failure) ([place])   . CKD (chronic kidney disease)   . Stroke (cerebrum) ([place])     11/2015   Past Surgical History  Procedure Laterality Date    . Coronary artery bypass graft  03/29/1999    x 4:  LIMA to the LAD,SVG to the diagonal branch of the LAD,SVG to the intermediate coronary artery & SVG to the posterior descending branch of the RCA.  . Colon surgery      x 4  . Nm myocar perf wall motion  04/28/2012    Low Risk Scan  . Prostate surgery    . Cardiac catheterization    . Cardiac catheterization N/A 12/12/2015    Procedure: Right/Left Heart Cath and Coronary/Graft Angiography;  Surgeon: Troy Sine, MD;  Location: [place] [facility];  Service: Cardiovascular;  Laterality: N/A;  . Peripheral vascular catheterization N/A 12/12/2015    Procedure: Aortic Arch Angiography;  Surgeon: Troy Sine, MD;  Location: [place] [facility];  Service: Cardiovascular;  Laterality: N/A;  . Eye surgery     Social History:  reports that he quit smoking about 32 years ago. His smoking use included Cigarettes. He has a 30 pack-year smoking history. He has never used smokeless tobacco. He reports that he drinks alcohol. He reports that he does not use illicit drugs.  Allergies  Allergen Reactions  . Mold Extract [Trichophyton] Anaphylaxis  . Lipitor [Atorvastatin] Other (See Comments)    Weakness in legs/myalgias    Family History  Problem Relation Age of Onset  . Kidney disease Brother   . Breast cancer Sister   . Rheum arthritis Mother      Prior to Admission medications   Medication Sig Start Date End Date Taking? Authorizing Provider  aspirin 81  MG tablet Take 81 mg by mouth daily.   Yes Historical Provider, MD  b complex vitamins capsule Take 1 capsule by mouth daily.   Yes Historical Provider, MD  benzonatate (TESSALON) 100 MG capsule Take 100 mg by mouth 3 (three) times daily. 11/24/15  Yes Historical Provider, MD  clopidogrel (PLAVIX) 75 MG tablet TAKE 1 TABLET DAILY 01/30/15  Yes Troy Sine, MD  CRESTOR 20 MG tablet Take 20 mg by mouth daily.  10/01/13  Yes Historical Provider, MD  furosemide (LASIX) 40 MG tablet TAKE 1  TABLET DAILY 11/27/15  Yes Troy Sine, MD  isosorbide mononitrate (IMDUR) 60 MG 24 hr tablet Take 0.5 tablets (30 mg total) by mouth daily. 12/29/15  Yes Ivan Anchors Love, PA-C  ketotifen (THERA TEARS ALLERGY) 0.025 % ophthalmic solution Place 1 drop into both eyes 2 (two) times daily.    Yes Historical Provider, MD  latanoprost (XALATAN) 0.005 % ophthalmic solution Place 1 drop into both eyes at bedtime. 08/25/13  Yes Historical Provider, MD  metoprolol succinate (TOPROL-XL) 100 MG 24 hr tablet Take 1/2 tablet daily 12/29/15  Yes Ivan Anchors Love, PA-C  Multiple Vitamins-Minerals (CENTRUM SILVER ULTRA MENS PO) Take 1 tablet by mouth daily.   Yes Historical Provider, MD  NEXIUM 40 MG capsule Take 40 mg by mouth daily.    Yes Historical Provider, MD  oxybutynin (DITROPAN-XL) 5 MG 24 hr tablet Take 5 mg by mouth daily.    Yes Historical Provider, MD  pioglitazone (ACTOS) 45 MG tablet Take 45 mg by mouth daily. 12/24/15  Yes Historical Provider, MD  potassium chloride SA (KLOR-CON M20) 20 MEQ tablet Take 3 tablets daily. Patient taking differently: Take 20 mEq by mouth 3 (three) times daily. Take 3 tablets daily. 12/29/15  Yes Ivan Anchors Love, PA-C  predniSONE (DELTASONE) 10 MG tablet Take 10 mg by mouth taper from 4 doses each day to 1 dose and stop. Last dose today 01/09/16 (10mg ) 01/04/16  Yes Historical Provider, MD  PROAIR HFA 108 (90 BASE) MCG/ACT inhaler Inhale into the lungs as needed.   Yes Historical Provider, MD  RESTASIS 0.05 % ophthalmic emulsion Place 1 drop into both eyes 2 (two) times daily. 10/01/13  Yes Historical Provider, MD  STIOLTO RESPIMAT 2.5-2.5 MCG/ACT AERS USE 2 INHALATIONS ORALLY   DAILY 12/27/15  Yes Brand Males, MD  VICTOZA 18 MG/3ML SOPN Inject 1.8 mg as directed every morning.  11/08/13  Yes Historical Provider, MD  vitamin C (ASCORBIC ACID) 500 MG tablet Take 500 mg by mouth 2 (two) times daily.   Yes Historical Provider, MD  vitamin E 400 UNIT capsule Take 400 Units by mouth daily.    Yes Historical Provider, MD  ZETIA 10 MG tablet Take 10 mg by mouth daily. 10/30/15  Yes Historical Provider, MD  B-D ULTRAFINE III SHORT PEN 31G X 8 MM MISC  10/19/14   Historical Provider, MD  insulin aspart (NOVOLOG) 100 UNIT/ML injection Inject 0-9 Units into the skin 3 (three) times daily with meals. Patient not taking: Reported on 01/08/2016 12/15/15   Albertine Patricia, MD  [facility] DELICA LANCETS [id number] MISC 1 each by Other route 2 (two) times daily. Use 1 lancet each to check glucose bid 09/03/15   Historical Provider, MD  [facility] VERIO test strip 1 strip by Other route 2 (two) times daily. Use 1 strip to check glucose bid 09/03/15   Historical Provider, MD  Spacer/Aero-Holding Chambers (AEROCHAMBER PLUS FLO-VU) [place]  10/10/14   Historical  Provider, MD  Vitamin D, Ergocalciferol, (DRISDOL) 50000 UNITS CAPS capsule Take 50,000 Units by mouth every 14 (fourteen) days. Every other week on Sunday 11/08/13   Historical Provider, MD   Physical Exam: Filed Vitals:   01/09/16 0845 01/09/16 0900 01/09/16 0915 01/09/16 0930  BP:  129/71 133/76 133/78  Pulse:  109 110 112  Temp:      TempSrc:      Resp: 26 22 22 22   Height:      Weight:      SpO2:  96% 96% 97%    Wt Readings from Last 3 Encounters:  01/09/16 90.719 kg (200 lb)  12/22/15 92.6 kg (204 lb 2.3 oz)  12/13/15 92.987 kg (205 lb)    General:  Appears calm and comfortable but somewhat ill Eyes: PERRL, normal lids, irises & conjunctiva ENT: grossly normal hearing, lips & tongue Neck: no LAD, masses or thyromegaly Cardiovascular: RRR, +murmur. No LE edema.  Respiratory: Mild increased work of breathing with conversation. Breath sounds are quite coarse with rhonchi particularly on the right constant moist nonproductive cough Abdomen: soft, ntnd positive bowel sounds Skin: no rash or induration seen on limited exam Musculoskeletal: grossly normal tone BUE/BLE Psychiatric: grossly normal mood and affect, speech fluent and  appropriate Neurologic: grossly non-focal. Speech clear facial symmetry           Labs on Admission:  Basic Metabolic Panel:  Recent Labs Lab 01/09/16 0704  NA 133*  K 4.4  CL 103  CO2 20*  GLUCOSE 325*  BUN 21*  CREATININE 1.31*  CALCIUM 9.4   Liver Function Tests:  Recent Labs Lab 01/09/16 0704  AST 21  ALT 20  ALKPHOS 73  BILITOT 1.0  PROT 7.3  ALBUMIN 2.9*   No results for input(s): LIPASE, AMYLASE in the last 168 hours. No results for input(s): AMMONIA in the last 168 hours. CBC:  Recent Labs Lab 01/09/16 0704  WBC 7.2  NEUTROABS 6.3  HGB 10.3*  HCT 31.9*  MCV 85.1  PLT 271   Cardiac Enzymes: No results for input(s): CKTOTAL, CKMB, CKMBINDEX, TROPONINI in the last 168 hours.  BNP (last 3 results)  Recent Labs  01/09/16 0704  BNP 338.3*    ProBNP (last 3 results) No results for input(s): PROBNP in the last 8760 hours.  CBG: No results for input(s): GLUCAP in the last 168 hours.  Radiological Exams on Admission: Dg Chest 2 View  01/09/2016  CLINICAL DATA:  Cough and shortness of breath for a week. EXAM: CHEST  2 VIEW COMPARISON:  January 04, 2016. FINDINGS: Stable cardiomegaly. Status post coronary artery bypass graft. No pneumothorax or significant pleural effusion is noted. There is interval development of right middle and lower lobe airspace opacities concerning for pneumonia. Left lung is clear. Bony thorax is unremarkable. IMPRESSION: Findings consistent with right middle and lower lobe pneumonia. Continued radiographic follow-up is recommended to ensure resolution. Electronically Signed   By: Marijo Conception, M.D.   On: 01/09/2016 08:08    EKG: Independently reviewed. Sinus tach with RBBB  Assessment/Plan Principal Problem:   Sepsis ([place]) Active Problems:   CAD (coronary artery disease) of artery bypass graft   Essential hypertension   DM2 (diabetes mellitus, type 2) (HCC)   Aortic valve stenosis   COPD (chronic obstructive  pulmonary disease) (HCC)   Stroke, acute, thrombotic ([place])   Acute respiratory failure ([place])   HCAP (healthcare-associated pneumonia)   CKD (chronic kidney disease)  #1. Sepsis likely related to healthcare  associated pneumonia. Max temp 102.8, lactic acid 2.54, heart heart rate 112 respiratory rate 30  blood pressure stable. ABG with pH of 7.47, PCO2 27.4, PO2 79.0, bicarbonate 19.8. He received 2 L of fluid in the emergency department and antibiotics initiated -Admit to step down -Continue antibiotics per protocol -Follow lactic acid -Follow blood cultures -IV fluids -Oxygen supplementation -Monitor closely  #2. Acute respiratory failure with hypoxia likely related to pneumonia. ABG as noted above. Oxygen saturation level 85% on room air. improved greater than 90% on 6 L nasal cannula. He wears oxygen at home only at night. Recently completed a prednisone taper provided by PCP -Continue oxygen supplementation -Nebulizers -Antibiotics per healthcare associated pneumonia protocol -Wean oxygen as able  #3. Healthcare associated pneumonia. Chest x-ray consistent with right middle and lower lobe pneumonia. Some concern for aspiration. -Antibiotics per protocol -Follow blood cultures -Nebulizers as noted above -Wean oxygen as able -Nothing by mouth until speech therapy evaluation   #4. Recent history of stroke. See discharge summary dated January 6. Spent 1 week inpatient rehabilitation. Review indicates some right upper and right lower extremity weakness. -Appears stable at baseline -Physical therapy -Continue Plavix  #5. Hypertension. Blood pressure controlled in the emergency department. Home medications include Lasix, imdur, Toprol. Will hold Lasix and imdur -Monitor closely  #6. Diabetes. Serum glucose 325 on admission. He just and a stay steroid taper -Sliding scale insulin -Hold oral agents for now  #7. Chronic kidney disease stage II. Creatinine 1.31 on admission. This  appears to be close to his baseline -Monitor intake and output -Check in the morning  #8. CAD. No chest pain. EKG as above. Initial troponin negative -cycle troponin -monitor -holding imdur for now  #9. Aortic stenosis/history of diastolic dysfunction. Echo with EF of [id number] grade 2 diastolic dysfunction. ENP 338. Does not appear overloaded -We'll hold his home Lasix for now -Continue his beta blocker -Monitor intake and output -Obtain daily weights   Code Status: full DVT Prophylaxis: Family Communication: daughter at bedside Disposition Plan: home when ready  Time spent: 89 minutes  [facility]

## 2016-01-09 NOTE — ED Notes (Signed)
Spoke with admit Doctor notified of updated bladder scan result and troponin of 0.07.

## 2016-01-09 NOTE — ED Notes (Signed)
Paged admit Doctor.

## 2016-01-09 NOTE — ED Notes (Signed)
Pt in EMS from home, reports SOB, cough present for past few days. Pt also febrile. Oral temp 102.9. Wheezing and rhonci noted. Given albuterol, atrovent, solumedrol en route. Pt recently DC from this facility dx with aortic stenosis

## 2016-01-09 NOTE — ED Provider Notes (Signed)
CSN: [id number]     Arrival date & time 01/09/16  [id number] History   First MD Initiated Contact with Patient 01/09/16 [phone]     Chief Complaint  Patient presents with  . Shortness of Breath  . Fever     (Consider location/radiation/quality/duration/timing/severity/associated sxs/prior Treatment) Patient is a 82 y.o. male presenting with shortness of breath and fever.  Shortness of Breath Severity:  Severe Onset quality:  Gradual Duration:  1 week Timing:  Constant Progression:  Worsening Chronicity:  New Context: not activity   Relieved by:  None tried Worsened by:  Nothing tried Ineffective treatments:  None tried Associated symptoms: cough and fever   Associated symptoms: no hemoptysis   Fever Associated symptoms: cough   Associated symptoms: no dysuria, no myalgias and no nausea     Past Medical History  Diagnosis Date  . Hypertension   . High cholesterol   . Prostate cancer ([place])   . CAD (coronary artery disease)   . OSA (obstructive sleep apnea)   . COPD (chronic obstructive pulmonary disease) (HCC)     MODERATE  . Diabetes mellitus ([place])   . GERD (gastroesophageal reflux disease)   . Vertigo   . Arthritis     OA  . Anemia   . CHF (congestive heart failure) [facility])    Past Surgical History  Procedure Laterality Date  . Coronary artery bypass graft  03/29/1999    x 4:  LIMA to the LAD,SVG to the diagonal branch of the LAD,SVG to the intermediate coronary artery & SVG to the posterior descending branch of the RCA.  . Colon surgery      x 4  . Nm myocar perf wall motion  04/28/2012    Low Risk Scan  . Prostate surgery    . Cardiac catheterization    . Cardiac catheterization N/A 12/12/2015    Procedure: Right/Left Heart Cath and Coronary/Graft Angiography;  Surgeon: Troy Sine, MD;  Location: [place] [facility];  Service: Cardiovascular;  Laterality: N/A;  . Peripheral vascular catheterization N/A 12/12/2015    Procedure: Aortic Arch Angiography;  Surgeon:  Troy Sine, MD;  Location: [place] [facility];  Service: Cardiovascular;  Laterality: N/A;  . Eye surgery     Family History  Problem Relation Age of Onset  . Kidney disease Brother   . Breast cancer Sister   . Rheum arthritis Mother    Social History  Substance Use Topics  . Smoking status: Former Smoker -- 1.00 packs/day for 30 years    Types: Cigarettes    Quit date: 12/24/1983  . Smokeless tobacco: Never Used     Comment: quit smoking in 1985  . Alcohol Use: 0.0 oz/week    0 Standard drinks or equivalent per week     Comment: rarely    Review of Systems  Constitutional: Positive for fever.  Respiratory: Positive for cough and shortness of breath. Negative for hemoptysis.   Cardiovascular: Positive for leg swelling.  Gastrointestinal: Negative for nausea.  Genitourinary: Negative for dysuria.  Musculoskeletal: Negative for myalgias.  All other systems reviewed and are negative.     Allergies  Mold extract and Lipitor  Home Medications   Prior to Admission medications   Medication Sig Start Date End Date Taking? Authorizing Provider  aspirin 81 MG tablet Take 81 mg by mouth daily.    Historical Provider, MD  b complex vitamins capsule Take 1 capsule by mouth daily.    Historical Provider, MD  B-D ULTRAFINE  III SHORT PEN 31G X 8 MM MISC  10/19/14   Historical Provider, MD  clopidogrel (PLAVIX) 75 MG tablet TAKE 1 TABLET DAILY 01/30/15   Troy Sine, MD  CRESTOR 20 MG tablet Take 20 mg by mouth daily.  10/01/13   Historical Provider, MD  furosemide (LASIX) 40 MG tablet TAKE 1 TABLET DAILY 11/27/15   Troy Sine, MD  insulin aspart (NOVOLOG) 100 UNIT/ML injection Inject 0-9 Units into the skin 3 (three) times daily with meals. Patient not taking: Reported on 01/08/2016 12/15/15   Silver Huguenin Elgergawy, MD  isosorbide mononitrate (IMDUR) 60 MG 24 hr tablet Take 0.5 tablets (30 mg total) by mouth daily. 12/29/15   Bary Leriche, PA-C  ketotifen (THERA TEARS ALLERGY)  0.025 % ophthalmic solution Place 1 drop into both eyes 2 (two) times daily.     Historical Provider, MD  latanoprost (XALATAN) 0.005 % ophthalmic solution Place 1 drop into both eyes at bedtime. 08/25/13   Historical Provider, MD  meclizine (ANTIVERT) 25 MG tablet Take 25 mg by mouth as needed for dizziness.    Historical Provider, MD  metoprolol succinate (TOPROL-XL) 100 MG 24 hr tablet Take 1/2 tablet daily 12/29/15   Ivan Anchors Love, PA-C  Multiple Vitamins-Minerals (CENTRUM SILVER ULTRA MENS PO) Take 1 tablet by mouth daily.    Historical Provider, MD  NEXIUM 40 MG capsule Take 40 mg by mouth daily.     Historical Provider, MD  [facility] DELICA LANCETS [id number] MISC 1 each by Other route 2 (two) times daily. Use 1 lancet each to check glucose bid 09/03/15   Historical Provider, MD  [facility] VERIO test strip 1 strip by Other route 2 (two) times daily. Use 1 strip to check glucose bid 09/03/15   Historical Provider, MD  oxybutynin (DITROPAN-XL) 5 MG 24 hr tablet Take 5 mg by mouth daily.     Historical Provider, MD  potassium chloride SA (KLOR-CON M20) 20 MEQ tablet Take 3 tablets daily. 12/29/15   Ivan Anchors Love, PA-C  PROAIR HFA 108 (90 BASE) MCG/ACT inhaler Inhale into the lungs as needed.    Historical Provider, MD  RESTASIS 0.05 % ophthalmic emulsion Place 1 drop into both eyes 2 (two) times daily. 10/01/13   Historical Provider, MD  Spacer/Aero-Holding Chambers (AEROCHAMBER PLUS FLO-VU) [place]  10/10/14   Historical Provider, MD  STIOLTO RESPIMAT 2.5-2.5 MCG/ACT AERS USE 2 INHALATIONS ORALLY   DAILY 12/27/15   Brand Males, MD  VICTOZA 18 MG/3ML SOPN Inject 1.8 mg as directed every morning.  11/08/13   Historical Provider, MD  vitamin C (ASCORBIC ACID) 500 MG tablet Take 500 mg by mouth 2 (two) times daily.    Historical Provider, MD  Vitamin D, Ergocalciferol, (DRISDOL) 50000 UNITS CAPS capsule Take 50,000 Units by mouth every 14 (fourteen) days. Every other week on Sunday 11/08/13   Historical Provider, MD   vitamin E 400 UNIT capsule Take 400 Units by mouth daily.    Historical Provider, MD   BP 135/67 mmHg  Pulse 122  Temp(Src) 102.9 F (39.4 C) (Oral)  Resp 37  Ht 6' (1.829 m)  Wt 200 lb (90.719 kg)  BMI 27.12 kg/m2  SpO2 93% Physical Exam  Constitutional: He appears well-developed and well-nourished. He appears distressed.  HENT:  Head: Normocephalic and atraumatic.  Eyes: Conjunctivae are normal. Pupils are equal, round, and reactive to light.  Neck: Normal range of motion.  Cardiovascular: Tachycardia present.   Pulmonary/Chest: Tachypnea noted. He is in respiratory  distress. He has wheezes. He has rales.  Abdominal: Soft. He exhibits no distension. There is no tenderness. There is no rebound.  Musculoskeletal: Normal range of motion. He exhibits edema. He exhibits no tenderness.  Neurological: He is alert.  Skin: Skin is warm and dry.  Nursing note and vitals reviewed.   ED Course  Procedures (including critical care time)  CRITICAL CARE Performed by: Merrily Pew   Total critical care time: 30 minutes  Critical care time was exclusive of separately billable procedures and treating other patients.  Critical care was necessary to treat or prevent imminent or life-threatening deterioration.  Critical care was time spent personally by me on the following activities: development of treatment plan with patient and/or surrogate as well as nursing, discussions with consultants, evaluation of patient's response to treatment, examination of patient, obtaining history from patient or surrogate, ordering and performing treatments and interventions, ordering and review of laboratory studies, ordering and review of radiographic studies, pulse oximetry and re-evaluation of patient's condition.   Labs Review Labs Reviewed  MRSA PCR SCREENING - Abnormal; Notable for the following:    MRSA by PCR POSITIVE (*)    All other components within normal limits  COMPREHENSIVE METABOLIC  PANEL - Abnormal; Notable for the following:    Sodium 133 (*)    CO2 20 (*)    Glucose, Bld 325 (*)    BUN 21 (*)    Creatinine, Ser 1.31 (*)    Albumin 2.9 (*)    GFR calc non Af Amer 48 (*)    GFR calc Af Amer 56 (*)    All other components within normal limits  CBC WITH DIFFERENTIAL/PLATELET - Abnormal; Notable for the following:    RBC 3.75 (*)    Hemoglobin 10.3 (*)    HCT 31.9 (*)    RDW 17.1 (*)    Lymphs Abs 0.6 (*)    All other components within normal limits  URINALYSIS, ROUTINE W REFLEX MICROSCOPIC (NOT AT [facility]) - Abnormal; Notable for the following:    Glucose, UA >1000 (*)    Ketones, ur 15 (*)    Protein, ur 30 (*)    All other components within normal limits  TROPONIN I - Abnormal; Notable for the following:    Troponin I 0.11 (*)    All other components within normal limits  BRAIN NATRIURETIC PEPTIDE - Abnormal; Notable for the following:    B Natriuretic Peptide 338.3 (*)    All other components within normal limits  TROPONIN I - Abnormal; Notable for the following:    Troponin I 0.07 (*)    All other components within normal limits  TROPONIN I - Abnormal; Notable for the following:    Troponin I 1.86 (*)    All other components within normal limits  TROPONIN I - Abnormal; Notable for the following:    Troponin I 2.98 (*)    All other components within normal limits  PROTIME-INR - Abnormal; Notable for the following:    Prothrombin Time 18.0 (*)    All other components within normal limits  COMPREHENSIVE METABOLIC PANEL - Abnormal; Notable for the following:    CO2 20 (*)    Glucose, Bld 351 (*)    BUN 23 (*)    Albumin 2.4 (*)    GFR calc non Af Amer 53 (*)    All other components within normal limits  CBC - Abnormal; Notable for the following:    RBC 3.02 (*)  Hemoglobin 8.2 (*)    HCT 25.7 (*)    RDW 17.0 (*)    All other components within normal limits  GLUCOSE, CAPILLARY - Abnormal; Notable for the following:    Glucose-Capillary 450 (*)     All other components within normal limits  GLUCOSE, CAPILLARY - Abnormal; Notable for the following:    Glucose-Capillary 397 (*)    All other components within normal limits  GLUCOSE, CAPILLARY - Abnormal; Notable for the following:    Glucose-Capillary 337 (*)    All other components within normal limits  GLUCOSE, CAPILLARY - Abnormal; Notable for the following:    Glucose-Capillary 314 (*)    All other components within normal limits  GLUCOSE, CAPILLARY - Abnormal; Notable for the following:    Glucose-Capillary 294 (*)    All other components within normal limits  TROPONIN I - Abnormal; Notable for the following:    Troponin I 2.21 (*)    All other components within normal limits  GLUCOSE, CAPILLARY - Abnormal; Notable for the following:    Glucose-Capillary 281 (*)    All other components within normal limits  GLUCOSE, CAPILLARY - Abnormal; Notable for the following:    Glucose-Capillary 301 (*)    All other components within normal limits  I-STAT CG4 LACTIC ACID, ED - Abnormal; Notable for the following:    Lactic Acid, Venous 2.54 (*)    All other components within normal limits  I-STAT ARTERIAL BLOOD GAS, ED - Abnormal; Notable for the following:    pH, Arterial 7.475 (*)    pCO2 arterial 27.4 (*)    pO2, Arterial 79.0 (*)    Bicarbonate 19.8 (*)    All other components within normal limits  CULTURE, BLOOD (ROUTINE X 2)  CULTURE, BLOOD (ROUTINE X 2)  URINE CULTURE  RESPIRATORY VIRUS PANEL  INFLUENZA PANEL BY PCR (TYPE A & B, H1N1)  URINE MICROSCOPIC-ADD ON  PROCALCITONIN  APTT  TROPONIN I  I-STAT CG4 LACTIC ACID, ED  I-STAT CG4 LACTIC ACID, ED    Imaging Review Dg Chest 2 View  01/09/2016  CLINICAL DATA:  Cough and shortness of breath for a week. EXAM: CHEST  2 VIEW COMPARISON:  January 04, 2016. FINDINGS: Stable cardiomegaly. Status post coronary artery bypass graft. No pneumothorax or significant pleural effusion is noted. There is interval development of  right middle and lower lobe airspace opacities concerning for pneumonia. Left lung is clear. Bony thorax is unremarkable. IMPRESSION: Findings consistent with right middle and lower lobe pneumonia. Continued radiographic follow-up is recommended to ensure resolution. Electronically Signed   By: Marijo Conception, M.D.   On: 01/09/2016 08:08    I have personally reviewed and evaluated these images and lab results as part of my medical decision-making.   EKG Interpretation   Date/Time:  Tuesday January 09 2016 06:50:29 EST Ventricular Rate:  121 PR Interval:  144 QRS Duration: 134 QT Interval:  345 QTC Calculation: 489 R Axis:   -89 Text Interpretation:  Sinus tachycardia RBBB and LAFB LVH by voltage  Probable posterior infarct, acute Confirmed by Christy Gentles  MD, DONALD  [phone]) on 01/09/2016 6:54:29 AM      MDM   Final diagnoses:  Acute respiratory failure with hypoxia (HCC)  Sepsis, due to unspecified organism ([place])  HCAP (healthcare-associated pneumonia)  Elevated troponin    1 week of worsening dyspnea, cough, fevr. In hospital at the end of December. No other obvious sources. Likely HCAP and will tx for same.  Patient with obvious pneumonia on cxr, elevated troponin/BNP possibly 2/2 demand ischemia, will admit for continued management.     Merrily Pew, MD 01/10/16 1325

## 2016-01-09 NOTE — Progress Notes (Signed)
ANTIBIOTIC CONSULT NOTE - INITIAL  Pharmacy Consult for vanc/cefepime Indication: HCAP  Allergies  Allergen Reactions  . Mold Extract [Trichophyton] Anaphylaxis  . Lipitor [Atorvastatin] Other (See Comments)    Weakness in legs/myalgias    Patient Measurements: Height: 6' (182.9 cm) Weight: 200 lb (90.719 kg) IBW/kg (Calculated) : 77.6 Adjusted Body Weight:   Vital Signs: Temp: 102.9 F (39.4 C) (01/17 0648) Temp Source: Oral (01/17 0648) BP: 135/67 mmHg (01/17 0648) Pulse Rate: 122 (01/17 0648) Intake/Output from previous day:   Intake/Output from this shift:    Labs: No results for input(s): WBC, HGB, PLT, LABCREA, CREATININE in the last 72 hours. Estimated Creatinine Clearance: 46.7 mL/min (by C-G formula based on Cr of 1.27). No results for input(s): VANCOTROUGH, VANCOPEAK, VANCORANDOM, GENTTROUGH, GENTPEAK, GENTRANDOM, TOBRATROUGH, TOBRAPEAK, TOBRARND, AMIKACINPEAK, AMIKACINTROU, AMIKACIN in the last 72 hours.   Microbiology: No results found for this or any previous visit (from the past 720 hour(s)).  Medical History: Past Medical History  Diagnosis Date  . Hypertension   . High cholesterol   . Prostate cancer ([place])   . CAD (coronary artery disease)   . OSA (obstructive sleep apnea)   . COPD (chronic obstructive pulmonary disease) (HCC)     MODERATE  . Diabetes mellitus ([place])   . GERD (gastroesophageal reflux disease)   . Vertigo   . Arthritis     OA  . Anemia   . CHF (congestive heart failure) (HCC)     Assessment: 69 yom with SOB, fever, wheezing. Pharmacy consulted to dose vanc/cefepime for HCAP. Code Sepsis also called. Tmax/24h 102.9, WBC wnl, LA 2.54. SCr 1.31 on admit, CrCl~45. Cefepime 2g x1 already given in ED.  1/17 vanc>> 1/17 cefepime>>  1/17 BCx2>> 1/17 UC>>  Goal of Therapy:  Vancomycin trough level 15-20 mcg/ml  Plan:  Cefepime 2g IV x1; then 2g IV q24h Vanc 1500mg  IV x1; then Vanc 1250mg  IV q24h Monitor clinical progress,  c/s, renal function, abx plan/LOT VT@SS  as indicated  Elicia Lamp, PharmD, [facility] Clinical Pharmacist Pager [phone] 01/09/2016 7:22 AM

## 2016-01-09 NOTE — ED Notes (Signed)
Bladder scan patient stomach for urine volume

## 2016-01-09 NOTE — Progress Notes (Signed)
Pharmacy Code Sepsis Protocol  Time of code sepsis page: 0725 [x]  Antibiotics delivered at 0729  Were antibiotics ordered at the time of the code sepsis page? Yes Was it required to contact the physician? [x]  Physician not contacted []  Physician contacted to order antibiotics for code sepsis []  Physician contacted to recommend changing antibiotics  Pharmacy consulted for: vanc/cefepime  Anti-infectives    Start     Dose/Rate Route Frequency Ordered Stop   01/09/16 0730  vancomycin (VANCOCIN) 1,750 mg in sodium chloride 0.9 % 500 mL IVPB  Status:  Discontinued     1,750 mg 250 mL/hr over 120 Minutes Intravenous  Once 01/09/16 0724 01/09/16 0725   01/09/16 0730  vancomycin (VANCOCIN) 1,500 mg in sodium chloride 0.9 % 500 mL IVPB     1,500 mg 250 mL/hr over 120 Minutes Intravenous  Once 01/09/16 0725     01/09/16 0715  ceFEPIme (MAXIPIME) 2 g in dextrose 5 % 50 mL IVPB     2 g 100 mL/hr over 30 Minutes Intravenous  Once 01/09/16 0703     01/09/16 0715  vancomycin (VANCOCIN) IVPB 1000 mg/200 mL premix  Status:  Discontinued     1,000 mg 200 mL/hr over 60 Minutes Intravenous  Once 01/09/16 0703 01/09/16 0724        Nurse education provided: [x]  Minutes left to administer antibiotics to achieve 1 hour goal [x]  Correct order of antibiotic administration [x]  Antibiotic Y-site compatibilities     Elicia Lamp, PharmD, BCPS Clinical Pharmacist Pager [phone] 01/09/2016 7:49 AM

## 2016-01-09 NOTE — ED Notes (Signed)
Spoke with Doctor ordered to bladder scan patient. To confirm patient did urinate and have a bowel movement at 0500 at home.

## 2016-01-09 NOTE — ED Notes (Signed)
Called floor for report nurse assisting another patient  

## 2016-01-09 NOTE — ED Notes (Signed)
EDP at bedside  

## 2016-01-09 NOTE — ED Notes (Signed)
Admit Doctor at bedside stated stop fluids after 2 liters.

## 2016-01-09 NOTE — ED Notes (Signed)
Repeated bladder scan. First scan completed by nurse tech second completed by nurse.

## 2016-01-09 NOTE — ED Notes (Signed)
In and out completed by Alfredo Martinez and Sunday Spillers Nurse Tech III. Patient tolerated without incident.

## 2016-01-09 NOTE — ED Notes (Signed)
EKG completed given to admit Doctor. Ordered for EKG for AM.

## 2016-01-09 NOTE — Evaluation (Signed)
Clinical/Bedside Swallow Evaluation Patient Details  Name: Douglas Carrillo MRN: [id number] Date of Birth: 11/02/1928  Today's Date: 01/09/2016 Time: SLP Start Time (ACUTE ONLY): 64 SLP Stop Time (ACUTE ONLY): 1538 SLP Time Calculation (min) (ACUTE ONLY): 28 min  Past Medical History:  Past Medical History  Diagnosis Date  . Hypertension   . High cholesterol   . Prostate cancer ([place])   . CAD (coronary artery disease)   . OSA (obstructive sleep apnea)   . COPD (chronic obstructive pulmonary disease) (HCC)     MODERATE  . Diabetes mellitus ([place])   . GERD (gastroesophageal reflux disease)   . Vertigo   . Arthritis     OA  . Anemia   . CHF (congestive heart failure) (HCC)     grade 2 diastolic dysfuction  . CKD (chronic kidney disease)   . Stroke (cerebrum) ([place])     11/2015  . HCAP (healthcare-associated pneumonia)     12/2015   Past Surgical History:  Past Surgical History  Procedure Laterality Date  . Coronary artery bypass graft  03/29/1999    x 4:  LIMA to the LAD,SVG to the diagonal branch of the LAD,SVG to the intermediate coronary artery & SVG to the posterior descending branch of the RCA.  . Colon surgery      x 4  . Nm myocar perf wall motion  04/28/2012    Low Risk Scan  . Prostate surgery    . Cardiac catheterization    . Cardiac catheterization N/A 12/12/2015    Procedure: Right/Left Heart Cath and Coronary/Graft Angiography;  Surgeon: Troy Sine, MD;  Location: [place] [facility];  Service: Cardiovascular;  Laterality: N/A;  . Peripheral vascular catheterization N/A 12/12/2015    Procedure: Aortic Arch Angiography;  Surgeon: Troy Sine, MD;  Location: [place] [facility];  Service: Cardiovascular;  Laterality: N/A;  . Eye surgery     HPI:  82 y.o. 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 He is admitted for sepsis secondary to PNA (RML and RLL per imaging report).   Assessment / Plan /  Recommendation Clinical Impression  Pt presents with concern for RML and RLL PNA s/p recent medullary CVA. His baseline cough appears to increase with thin and nectar thick liquids as well as with dry solids. Wet vocal quality noted with these liquid consistencies, but not with honey thick liquids. Given his risk factors and his current infection, favor a more conservative diet of Dys 2 textures and honey thick liquids until MBS can be completed on next date.    Aspiration Risk  Moderate aspiration risk    Diet Recommendation Dysphagia 2 (Fine chop);Honey-thick liquid   Liquid Administration via: Cup Medication Administration: Whole meds with puree Supervision: Patient able to self feed;Full supervision/cueing for compensatory strategies Compensations: Slow rate;Small sips/bites Postural Changes: Seated upright at 90 degrees    Other  Recommendations Oral Care Recommendations: Oral care BID Other Recommendations: Order thickener from pharmacy;Prohibited food (jello, ice cream, thin soups);Remove water pitcher   Follow up Recommendations       Frequency and Duration            Prognosis        Swallow Study   General HPI: 82 y.o. 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 He is admitted for sepsis secondary to PNA (RML and RLL  per imaging report). Type of Study: Bedside Swallow Evaluation Previous Swallow Assessment: none in chart Diet Prior to this Study: NPO Temperature Spikes Noted: Yes (102.9) Respiratory Status: Nasal cannula History of Recent Intubation: No Behavior/Cognition: Alert;Cooperative;Pleasant mood Oral Cavity Assessment: Within Functional Limits Oral Care Completed by SLP: No Oral Cavity - Dentition: Adequate natural dentition Vision: Functional for self-feeding Self-Feeding Abilities: Able to feed self Patient Positioning: Upright in bed Baseline Vocal Quality: Normal Volitional Cough:  Strong;Wet Volitional Swallow: Unable to elicit    Oral/Motor/Sensory Function Overall Oral Motor/Sensory Function: Within functional limits   Ice Chips Ice chips: Impaired Presentation: Spoon Pharyngeal Phase Impairments: Suspected delayed Swallow   Thin Liquid Thin Liquid: Impaired Presentation: Cup;Self Fed Pharyngeal  Phase Impairments: Suspected delayed Swallow;Wet Vocal Quality;Cough - Delayed    Nectar Thick Nectar Thick Liquid: Impaired Presentation: Cup;Self Fed Pharyngeal Phase Impairments: Suspected delayed Swallow;Wet Vocal Quality;Cough - Delayed   Honey Thick Honey Thick Liquid: Impaired Presentation: Cup;Self fed Pharyngeal Phase Impairments: Suspected delayed Swallow;Cough - Delayed   Puree Puree: Impaired Presentation: Self Fed;Spoon Pharyngeal Phase Impairments: Suspected delayed Swallow   Solid   GO   Solid: Impaired Presentation: Self Fed Pharyngeal Phase Impairments: Suspected delayed Swallow;Cough - Immediate       Germain Osgood, M.A. CCC-SLP [phone]  Germain Osgood 01/09/2016,3:58 PM

## 2016-01-09 NOTE — ED Notes (Signed)
Provider at bedside stated will speak to attending and possibly change bed assignment from telemetry to stepdown.

## 2016-01-09 NOTE — ED Notes (Signed)
O2 sats noted to be 85% RA, pt was placed on 6L [place]. O12 sats now up to 92%.

## 2016-01-09 NOTE — Progress Notes (Addendum)
Shift event: RN paged this NP earlier with elevated troponin result. Pt presented with SOB on admit and was found to have HCAP. Pt also has hx of CAD with CABG in past, HTN, OSA on O2 at night, DM, CHF, and very recent stroke.  Troponins have been #1-.11, #2-.07 and #3-up to 1.86. Verified with RN that pt has no active CP. 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 Didn't see any acute changes.  Review of pt's meds include Plavix, 81mg  ASA, Crestor and a BB.  Paged Dr. Radford Pax to discuss. EKGs were reviewed by her and her opinion is that pt's bumped troponin is likely due to sepsis given no CP and his acute illness. She noted no acute changes on EKG. Is coagulated with Plavix and ASA.  Will cycle another troponin and follow. Douglas Boll, NP [facility] Addendum: Next troponin 2.98. Still without CP. Will let cardiology know. Recheck trop in am.  Douglas Duel, NP [facility]

## 2016-01-10 ENCOUNTER — Inpatient Hospital Stay (HOSPITAL_COMMUNITY): Payer: Medicare Other

## 2016-01-10 DIAGNOSIS — I10 Essential (primary) hypertension: Secondary | ICD-10-CM

## 2016-01-10 DIAGNOSIS — I350 Nonrheumatic aortic (valve) stenosis: Secondary | ICD-10-CM

## 2016-01-10 DIAGNOSIS — J9601 Acute respiratory failure with hypoxia: Secondary | ICD-10-CM

## 2016-01-10 DIAGNOSIS — J189 Pneumonia, unspecified organism: Secondary | ICD-10-CM

## 2016-01-10 DIAGNOSIS — I25709 Atherosclerosis of coronary artery bypass graft(s), unspecified, with unspecified angina pectoris: Secondary | ICD-10-CM

## 2016-01-10 DIAGNOSIS — J9600 Acute respiratory failure, unspecified whether with hypoxia or hypercapnia: Secondary | ICD-10-CM

## 2016-01-10 LAB — RESPIRATORY VIRUS PANEL
Adenovirus: NEGATIVE
Influenza A: NEGATIVE
Influenza B: NEGATIVE
METAPNEUMOVIRUS: NEGATIVE
PARAINFLUENZA 1 A: NEGATIVE
PARAINFLUENZA 2 A: NEGATIVE
Parainfluenza 3: NEGATIVE
RESPIRATORY SYNCYTIAL VIRUS B: NEGATIVE
Respiratory Syncytial Virus A: NEGATIVE
Rhinovirus: NEGATIVE

## 2016-01-10 LAB — TROPONIN I
TROPONIN I: 2.98 ng/mL — AB (ref <0.031)
TROPONIN I: 1.64 ng/mL — AB (ref <0.031)
Troponin I: 2.21 ng/mL (ref <0.031)

## 2016-01-10 LAB — URINE CULTURE: Culture: NO GROWTH

## 2016-01-10 LAB — COMPREHENSIVE METABOLIC PANEL
ALT: 18 U/L (ref 17–63)
ANION GAP: 10 (ref 5–15)
AST: 26 U/L (ref 15–41)
Albumin: 2.4 g/dL — ABNORMAL LOW (ref 3.5–5.0)
Alkaline Phosphatase: 60 U/L (ref 38–126)
BUN: 23 mg/dL — AB (ref 6–20)
CHLORIDE: 107 mmol/L (ref 101–111)
CO2: 20 mmol/L — ABNORMAL LOW (ref 22–32)
CREATININE: 1.2 mg/dL (ref 0.61–1.24)
Calcium: 9.2 mg/dL (ref 8.9–10.3)
GFR, EST NON AFRICAN AMERICAN: 53 mL/min — AB (ref >60)
Glucose, Bld: 351 mg/dL — ABNORMAL HIGH (ref 65–99)
Potassium: 4 mmol/L (ref 3.5–5.1)
Sodium: 137 mmol/L (ref 135–145)
Total Bilirubin: 0.6 mg/dL (ref 0.3–1.2)
Total Protein: 6.6 g/dL (ref 6.5–8.1)

## 2016-01-10 LAB — CBC
HCT: 25.7 % — ABNORMAL LOW (ref 39.0–52.0)
Hemoglobin: 8.2 g/dL — ABNORMAL LOW (ref 13.0–17.0)
MCH: 27.2 pg (ref 26.0–34.0)
MCHC: 31.9 g/dL (ref 30.0–36.0)
MCV: 85.1 fL (ref 78.0–100.0)
PLATELETS: 262 10*3/uL (ref 150–400)
RBC: 3.02 MIL/uL — ABNORMAL LOW (ref 4.22–5.81)
RDW: 17 % — AB (ref 11.5–15.5)
WBC: 9.9 10*3/uL (ref 4.0–10.5)

## 2016-01-10 LAB — GLUCOSE, CAPILLARY
GLUCOSE-CAPILLARY: 301 mg/dL — AB (ref 65–99)
Glucose-Capillary: 294 mg/dL — ABNORMAL HIGH (ref 65–99)
Glucose-Capillary: 281 mg/dL — ABNORMAL HIGH (ref 65–99)
Glucose-Capillary: 263 mg/dL — ABNORMAL HIGH (ref 65–99)
Glucose-Capillary: 207 mg/dL — ABNORMAL HIGH (ref 65–99)

## 2016-01-10 MED ORDER — IPRATROPIUM-ALBUTEROL 0.5-2.5 (3) MG/3ML IN SOLN
3.0000 mL | Freq: Three times a day (TID) | RESPIRATORY_TRACT | Status: DC
  Administered 2016-01-10 – 2016-01-16 (×19): 3 mL via RESPIRATORY_TRACT
  Filled 2016-01-10 (×20): qty 3

## 2016-01-10 MED ORDER — MUPIROCIN 2 % EX OINT
1.0000 "application " | TOPICAL_OINTMENT | Freq: Two times a day (BID) | CUTANEOUS | Status: AC
  Administered 2016-01-10 – 2016-01-14 (×10): 1 via NASAL
  Filled 2016-01-10 (×4): qty 22

## 2016-01-10 MED ORDER — INSULIN ASPART 100 UNIT/ML ~~LOC~~ SOLN
8.0000 [IU] | SUBCUTANEOUS | Status: AC
  Administered 2016-01-10: 8 [IU] via SUBCUTANEOUS

## 2016-01-10 MED ORDER — INSULIN GLARGINE 100 UNIT/ML ~~LOC~~ SOLN
15.0000 [IU] | Freq: Every day | SUBCUTANEOUS | Status: DC
  Administered 2016-01-10 – 2016-01-11 (×2): 15 [IU] via SUBCUTANEOUS
  Filled 2016-01-10 (×4): qty 0.15

## 2016-01-10 MED ORDER — CHLORHEXIDINE GLUCONATE CLOTH 2 % EX PADS
6.0000 | MEDICATED_PAD | Freq: Every day | CUTANEOUS | Status: AC
  Administered 2016-01-10 – 2016-01-14 (×5): 6 via TOPICAL

## 2016-01-10 MED ORDER — ENSURE ENLIVE PO LIQD
237.0000 mL | Freq: Two times a day (BID) | ORAL | Status: DC
  Administered 2016-01-11 – 2016-01-16 (×7): 237 mL via ORAL

## 2016-01-10 NOTE — Progress Notes (Signed)
Inpatient Diabetes Program Recommendations  AACE/ADA: New Consensus Statement on Inpatient Glycemic Control (2015)  Target Ranges:  Prepandial:   less than 140 mg/dL      Peak postprandial:   less than 180 mg/dL (1-2 hours)      Critically ill patients:  140 - 180 mg/dL   Review of Glycemic Control:  Results for Douglas Carrillo, Douglas Carrillo (MRN [id number]) as of 01/10/2016 13:17  Ref. Range 01/09/2016 19:46 01/09/2016 21:32 01/09/2016 23:24 01/10/2016 02:03 01/10/2016 07:33  Glucose-Capillary Latest Ref Range: 65-99 mg/dL 397 (H) 337 (H) 314 (H) 294 (H) 281 (H)   Results for [name], [name] (MRN [id number]) as of 01/10/2016 13:17  Ref. Range 12/14/2015 03:54  Hemoglobin A1C Latest Ref Range: 4.8-5.6 % 6.8 (H)   Diabetes history: Type 2 diabetes Outpatient Diabetes medications: Actos 45 mg daily, Victoza 1.8 mg daily Current orders for Inpatient glycemic control:  Novolog moderate tid with meals and HS, Lantus 15 units daily  Inpatient Diabetes Program Recommendations:   Note that patient's blood sugars are increased.  Patient was on a PO prednisone taper prior to admit which has likely increased blood sugars.  Note that A1C indicates good control of diabetes when checked in December, 2016. Agree with current diabetes regimen.  Anticipate that blood sugars will improve since he is no longer on steroids.    Thanks, Adah Perl, RN, BC-ADM Inpatient Diabetes Coordinator Pager [phone] (8a-5p)

## 2016-01-10 NOTE — Progress Notes (Signed)
MBSS complete. Full report located under chart review in imaging section. Azlan Hanway, MA CCC-SLP [phone]  

## 2016-01-10 NOTE — Progress Notes (Signed)
CRITICAL VALUE ALERT  Critical value received:  Troponin 1.86  Date of notification:  01/09/16  Time of notification:  2104  Critical value read back:Yes.    Nurse who received alert:  Sylvan Cheese, RN   MD notified (1st page):  Tylene Fantasia  Time of first page:  2106  Responding MD:  Tylene Fantasia  Time MD responded:  2110

## 2016-01-10 NOTE — Care Management Note (Addendum)
Case Management Note  Patient Details  Name: Douglas Carrillo MRN: [id number] Date of Birth: Aug 20, 1928  Subjective/Objective:       Date: 01/10/16 Spoke with patient at the bedside.  Introduced self as Tourist information centre manager and explained role in discharge planning and how to be reached.  Verified patient lives in town, alone.   He has home oxygen that he only uses it at night, he can not remember the company who it is through, NCM checked with [facility] and he does not have oxygen with them.  Expressed potential need for no other DME.  Verified patient anticipates to go home alone at time of discharge and will have part-time supervision by family at this time to best of their knowledge.  Patient denied needing help with their medication.  Patient drives to MD appointments.  Verified patient has PCP [name].   Plan: CM will continue to follow for discharge planning and [facility] resources.              Action/Plan:   Expected Discharge Date:                  Expected Discharge Plan:  [name]  In-House Referral:     Discharge planning Services  CM Consult  Post Acute Care Choice:    Choice offered to:     DME Arranged:    DME Agency:     HH Arranged:    [place] Agency:     Status of Service:  In process, will continue to follow  Medicare Important Message Given:    Date Medicare IM Given:    Medicare IM give by:    Date Additional Medicare IM Given:    Additional Medicare Important Message give by:     If discussed at [place] of Stay Meetings, dates discussed:    Additional Comments:  Zenon Mayo, RN 01/10/2016, 3:59 PM

## 2016-01-10 NOTE — Progress Notes (Signed)
CRITICAL VALUE ALERT  Critical value received: POSITIVE blood cultures - Gram POSITIVE cocci in clusters in both aerobic and anaerobic bottles  Date of notification:  01/10/16  Time of notification:  [id number]  Critical value read back:Yes.    Nurse who received alert:  Sylvan Cheese, RN  MD notified (1st page):  Tylene Fantasia  Time of first page:  [phone]

## 2016-01-10 NOTE — Progress Notes (Signed)
Patient and belongings transferred to [phone].  Patient's daughter has been called to notify her of room change, I left a message for her to call me back.  Will try again in the morning if she has not returned my call.  VSS.

## 2016-01-10 NOTE — Progress Notes (Signed)
Initial Nutrition Assessment  DOCUMENTATION CODES:   Not applicable  INTERVENTION:   Ensure Enlive po BID, each supplement provides 350 kcal and 20 grams of protein  NUTRITION DIAGNOSIS:   Inadequate oral intake related to poor appetite as evidenced by per patient/family report.  GOAL:   Patient will meet greater than or equal to 90% of their needs  MONITOR:   PO intake, Supplement acceptance, Labs, Weight trends, Skin, I & O's  REASON FOR ASSESSMENT:   Malnutrition Screening Tool    ASSESSMENT:   Douglas Carrillo is a 82 y.o. male with a Past Medical History of HTN, HLD, CAD, OSA, COPD, DM, GERD, CHF, CVA, prostate cancer who presents with sepsis secondary to HCAP.   Pt admitted with sepsis secondary to HCAP.   Pt underwent BSE on 01/09/16, which revealed moderate aspiration risk and was placed in a dysphagia 2 diet with honey thick liquids. Pt underwent MBSS on 01/10/16 and upgraded to a regular diet with thin liquids.   Spoke with pt, pt granddaughter, and pt nephew at bedside. Pt reports his appetite is improving. Pt granddaughter reports poor appetite and weight loss for approximately one month. She endorses pt has had a 15# wt loss over the past month (documented wt hx reveals a 23# (11.2%) wt loss within the past month). Pt does consume 3 meals per day, however, pt only consumes small portions. Pt ate a good breakfast today, however, didn't eat well as lunch due to receiving multiple food items during MBSS. He consumed Ensure intermittently PTA and is amenable to receiving during hospitalization.   Nutrition-Focused physical exam completed. Findings are no fat depletion, mild muscle depletion, and no edema. Pt reports decreased mobility secondary to stroke; was just d/c from CIR and was receiving outpatient therapy a few times per week.   Discussed importance of good meal and supplement completion to promote healing.   Labs reviewed: CBGS: 281-301.   Diet Order:  Diet  regular Room service appropriate?: Yes; Fluid consistency:: Thin  Skin:  Reviewed, no issues  Last BM:  1/181/7  Height:   Ht Readings from Last 1 Encounters:  01/09/16 6' (1.829 m)    Weight:   Wt Readings from Last 1 Encounters:  01/10/16 182 lb 1.6 oz (82.6 kg)    Ideal Body Weight:  80.9 kg  BMI:  Body mass index is 24.69 kg/(m^2).  Estimated Nutritional Needs:   Kcal:  2000-2200  Protein:  90-105 grams  Fluid:  2.0-2.2 L  EDUCATION NEEDS:   Education needs addressed  Pasqualina Colasurdo A. Jimmye Norman, RD, LDN, CDE Pager: [phone] After hours Pager: [phone]

## 2016-01-10 NOTE — Progress Notes (Addendum)
[facility] PROGRESS NOTE  Douglas Carrillo [id number] DOB: October 10, 1928 DOA: 01/09/2016 PCP: Limmie Patricia, MD  Length of stay: 1   Assessment/Plan: Principal Problem:   Sepsis ([place]) Active Problems:   CAD (coronary artery disease) of artery bypass graft   Essential hypertension   DM2 (diabetes mellitus, type 2) (HCC)   Aortic valve stenosis   COPD (chronic obstructive pulmonary disease) (HCC)   Stroke, acute, thrombotic ([place])   Acute respiratory failure ([facility])   HCAP (healthcare-associated pneumonia)   CKD (chronic kidney disease)      82 y.o. male with a past medical historyHTN, HLD, CAD, OSA, COPD, DM, GERD, CHF, CVA, prostate cancer who presents with sepsis secondary to HCAP.   on oxygen at night, severe aortic stenosis and recent CVA and 12/12/15 ( Dr. Leonie Man consulted and felt that stroke due to sequela of cardiac cath and to continue Plavix for secondary stroke prevention. Carotid dopplers without significant ICA stenosis.) presents to emergency department with a persistent gradual worsening shortness of breath/cough and fever. Initial evaluation reveals sepsis and acute respiratory failure secondary to pneumonia.   Patient reports he has been home from hospital for 10 days and has been coughing the entire time. Went to see [place] PCP and was provided with IM steroids and by mouth steroid taper which he finished. Reports today cough has become worse it sounds wet but is nonproductive. Associated symptoms include fever decreased appetite generalized weakness. He denies chest pain palpitations abdominal pain nausea vomiting diarrhea. He denies dysuria hematuria frequency or urgency. He denies lower extremity edema or orthopnea.  Assessment and plan #1. Sepsis likely related to healthcare associated pneumonia  ,right middle and lower lobe pneumonia . Max temp 102.8, lactic acid 2.54, heart heart rate 112 respiratory rate 30 blood pressure stable. ABG with pH of 7.47, PCO2  27.4, PO2 79.0, bicarbonate 19.8. He received 2 L of fluid in the emergency department and antibiotics initiated Cont  step down -Continue cefepime and vancomycin, day #2 Initial lactic acid 1.45, pro calcitonin 5.41, influenza PCR negative, Blood cultures no growth so far, currently on 2 L of nasal cannula Speech therapy evaluation pending   #2. Acute respiratory failure with hypoxia likely related to pneumonia. ABG as noted above. Oxygen saturation level 85% on room air. improved greater than 90% on 6 L nasal cannula. He wears oxygen at home only at night. Recently completed a prednisone taper provided by PCP -Continue oxygen supplementation -Nebulizers -Antibiotics per healthcare associated pneumonia protocol -Wean oxygen as able   #3. Healthcare associated pneumonia. Chest x-ray consistent with right middle and lower lobe pneumonia. Some concern for aspiration. See #1   #4. Recent history of stroke. See discharge summary dated January 6. Spent 1 week inpatient rehabilitation. Review indicates some right upper and right lower extremity weakness. -Appears stable at baseline -Physical therapy -Continue Plavix/aspirin  #5. Hypertension. Blood pressure controlled in the emergency department. Home medications include Lasix, imdur, Toprol. Will hold Lasix and imdur -Monitor closely  #6. Diabetes. Uncontrolled, [phone]  Start Lantus in addition to [place] Hold Actos  #7. Chronic kidney disease stage II. Creatinine 1.31 on admission. This appears to be close to his baseline -Monitor intake and output -Check in the morning  #8. CAD. With abnormal troponin, likely demand ischemia, No chest pain. EKG as above. Initial troponin negative Troponin peaked at  2.98,> 2.21 Continue aspirin and Plavix Cardiology consultation requested  Repeat 2-D echo given abnormal troponin   #9. Aortic stenosis/history  of diastolic dysfunction. Echo with EF of [id number] grade 2 diastolic dysfunction. ENP 338.  Does not appear overloaded -We'll hold his home Lasix for now, continue metoprolol Cardiology consultation     DVT prophylaxsis   Code Status:      Code Status Orders        Start     Ordered   01/09/16 1429  Full code   Continuous     01/09/16 1428    Code Status History      Family Communication: Discussed in detail with the patient, all imaging results, lab results explained to the patient   Disposition Plan:  tx to tele after being seen by cards     Consultants:  None   Procedures:  None   Antibiotics: Anti-infectives    Start     Dose/Rate Route Frequency Ordered Stop   01/10/16 0730  ceFEPIme (MAXIPIME) 2 g in dextrose 5 % 50 mL IVPB     2 g 100 mL/hr over 30 Minutes Intravenous Every 24 hours 01/09/16 0807     01/10/16 0730  vancomycin (VANCOCIN) 1,250 mg in sodium chloride 0.9 % 250 mL IVPB     1,250 mg 166.7 mL/hr over 90 Minutes Intravenous Every 24 hours 01/09/16 0807     01/09/16 0730  vancomycin (VANCOCIN) 1,750 mg in sodium chloride 0.9 % 500 mL IVPB  Status:  Discontinued     1,750 mg 250 mL/hr over 120 Minutes Intravenous  Once 01/09/16 0724 01/09/16 0725   01/09/16 0730  vancomycin (VANCOCIN) 1,500 mg in sodium chloride 0.9 % 500 mL IVPB     1,500 mg 250 mL/hr over 120 Minutes Intravenous  Once 01/09/16 0725 01/09/16 0950   01/09/16 0715  ceFEPIme (MAXIPIME) 2 g in dextrose 5 % 50 mL IVPB     2 g 100 mL/hr over 30 Minutes Intravenous  Once 01/09/16 0703 01/09/16 0809   01/09/16 0715  vancomycin (VANCOCIN) IVPB 1000 mg/200 mL premix  Status:  Discontinued     1,000 mg 200 mL/hr over 60 Minutes Intravenous  Once 01/09/16 0703 01/09/16 0724         HPI/Subjective: Feeling well ,sitting in the chair , no cough, slight wheeze   Objective: Filed Vitals:   01/10/16 0347 01/10/16 0548 01/10/16 0741 01/10/16 0842  BP: 136/69  151/82   Pulse: 91     Temp:   98.3 F (36.8 C)   TempSrc:   Oral   Resp: 23     Height:      Weight:  82.6 kg  (182 lb 1.6 oz)    SpO2: 94%   100%    Intake/Output Summary (Last 24 hours) at 01/10/16 1011 Last data filed at 01/10/16 0240  Gross per 24 hour  Intake 439.17 ml  Output   1300 ml  Net -860.83 ml    Exam:  General: No acute respiratory distress Lungs: Clear to auscultation bilaterally , some  wheezes , no crackles Cardiovascular: Regular rate and rhythm without murmur gallop or rub normal S1 and S2 Abdomen: Nontender, nondistended, soft, bowel sounds positive, no rebound, no ascites, no appreciable mass Extremities: No significant cyanosis, clubbing, or edema bilateral lower extremities     Data Review   Micro Results Recent Results (from the past 240 hour(s))  Culture, blood (routine x 2)     Status: None (Preliminary result)   Collection Time: 01/09/16  7:05 AM  Result Value Ref Range Status   Specimen Description BLOOD RIGHT ANTECUBITAL  Final   Special Requests BOTTLES DRAWN AEROBIC AND ANAEROBIC 5MLS  Final   Culture   Final    GRAM POSITIVE COCCI IN CLUSTERS IN BOTH AEROBIC AND ANAEROBIC BOTTLES CRITICAL RESULT CALLED TO, READ BACK BY AND VERIFIED WITH: J MILLER @0236  01/10/16 MKELLY    Report Status PENDING  Incomplete  Culture, blood (routine x 2)     Status: None (Preliminary result)   Collection Time: 01/09/16  7:20 AM  Result Value Ref Range Status   Specimen Description BLOOD LEFT ANTECUBITAL  Final   Special Requests BOTTLES DRAWN AEROBIC AND ANAEROBIC 10MLS  Final   Culture  Setup Time   Final    GRAM POSITIVE COCCI IN CLUSTERS IN BOTH AEROBIC AND ANAEROBIC BOTTLES CRITICAL RESULT CALLED TO, READ BACK BY AND VERIFIED WITH: J MILLER @0236  01/10/16 MKELLY    Culture GRAM POSITIVE COCCI  Final   Report Status PENDING  Incomplete  Urine culture     Status: None (Preliminary result)   Collection Time: 01/09/16 11:46 AM  Result Value Ref Range Status   Specimen Description URINE, CATHETERIZED  Final   Special Requests NONE  Final   Culture NO GROWTH <  24 HOURS  Final   Report Status PENDING  Incomplete  MRSA PCR Screening     Status: Abnormal   Collection Time: 01/09/16  3:01 PM  Result Value Ref Range Status   MRSA by PCR POSITIVE (A) NEGATIVE Final    Comment:        The GeneXpert MRSA Assay (FDA approved for NASAL specimens only), is one component of a comprehensive MRSA colonization surveillance program. It is not intended to diagnose MRSA infection nor to guide or monitor treatment for MRSA infections. RESULT CALLED TO, READ BACK BY AND VERIFIED WITH: Jeannene Patella RN 17:15 01/09/16 ([email or web address])     Radiology Reports Dg Chest 2 View  01/09/2016  CLINICAL DATA:  Cough and shortness of breath for a week. EXAM: CHEST  2 VIEW COMPARISON:  January 04, 2016. FINDINGS: Stable cardiomegaly. Status post coronary artery bypass graft. No pneumothorax or significant pleural effusion is noted. There is interval development of right middle and lower lobe airspace opacities concerning for pneumonia. Left lung is clear. Bony thorax is unremarkable. IMPRESSION: Findings consistent with right middle and lower lobe pneumonia. Continued radiographic follow-up is recommended to ensure resolution. Electronically Signed   By: Marijo Conception, M.D.   On: 01/09/2016 08:08   Dg Chest 2 View  01/04/2016  CLINICAL DATA:  Wheezing, cough, congestion for 1 week EXAM: CHEST  2 VIEW COMPARISON:  12/13/2015 FINDINGS: Cardiomediastinal silhouette is stable. Central mild vascular congestion without convincing pulmonary edema. Status post CABG. Mild infrahilar bronchitic changes. No segmental infiltrate. Mild hyperinflation. IMPRESSION: Central mild vascular congestion without convincing pulmonary edema. Mild infrahilar bronchitic changes. Status post CABG. Mild hyperinflation. Electronically Signed   By: Lahoma Crocker M.D.   On: 01/04/2016 15:52   Dg Chest 2 View  12/11/2015  CLINICAL DATA:  History of coronary artery disease, pre catheterization examination, history of  prior tobacco use, COPD. EXAM: CHEST  2 VIEW COMPARISON:  Chest x-ray of September 25th first 2015 and CT scan of the chest of October 24, 2014. FINDINGS: The lungs remain hyperinflated. There is no focal infiltrate. There is no pleural effusion. The cardiac silhouette is mildly enlarged. The central pulmonary vascularity is prominent but there is no cephalization of the vascular pattern. There are 8 intact sternal wires. The patient has undergone previous  CABG. The bony thorax exhibits no acute abnormality. IMPRESSION: COPD with superimposed cardiomegaly and mild central pulmonary vascular prominence. These findings are stable. There is no evidence of pneumonia nor pulmonary edema. Electronically Signed   By: David  Martinique M.D.   On: 12/11/2015 10:51   Ct Head Wo Contrast  12/13/2015  CLINICAL DATA:  Recent fall EXAM: CT HEAD WITHOUT CONTRAST TECHNIQUE: Contiguous axial images were obtained from the base of the skull through the vertex without intravenous contrast. COMPARISON:  None. FINDINGS: Bony calvarium is intact. Mild atrophic changes are noted commenced with the patient's given age. No findings to suggest acute hemorrhage, acute infarction or space-occupying mass lesion are identified. IMPRESSION: No acute abnormality noted. Electronically Signed   By: Inez Catalina M.D.   On: 12/13/2015 08:20   Mr Jodene Nam Head Wo Contrast  12/13/2015  CLINICAL DATA:  Cardiac catheterization yesterday. Awoke with right-sided arm and leg weakness. EXAM: MRI HEAD WITHOUT AND WITH CONTRAST MRA HEAD WITHOUT CONTRAST MRA NECK WITHOUT AND WITH CONTRAST TECHNIQUE: Multiplanar, multiecho pulse sequences of the brain and surrounding structures were obtained without and with intravenous contrast. Angiographic images of the Circle of Willis were obtained using MRA technique without intravenous contrast. Angiographic images of the neck were obtained using MRA technique without and with intravenous contrast. Carotid stenosis  measurements (when applicable) are obtained utilizing NASCET criteria, using the distal internal carotid diameter as the denominator. CONTRAST:  66mL MULTIHANCE GADOBENATE DIMEGLUMINE 529 MG/ML IV SOLN COMPARISON:  CT same day FINDINGS: MRI HEAD FINDINGS Diffusion imaging shows acute infarction within the ventral surface of the left medulla. No other acute infarction. Elsewhere the brainstem and cerebellum are normal. The cerebral hemispheres show mild chronic small-vessel ischemic changes of the white matter, fairly typical for age. Small focus of hemosiderin deposition in the right parietal deep white matter. No cortical or large vessel territory infarction. No mass lesion, acute hemorrhage, hydrocephalus or extra-axial collection. No pituitary mass. No inflammatory sinus disease. Small mastoid effusions bilaterally. After contrast administration, no abnormal enhancement of the brain occurs. MRA HEAD FINDINGS Both internal carotid arteries are widely patent into the brain. The anterior and middle cerebral vessels are patent without proximal stenosis, aneurysm or vascular malformation. Both vertebral arteries are patent with the right being dominant. No basilar stenosis. Anterior inferior cerebellar arteries, superior cerebellar arteries and posterior cerebral arteries appear patent and normal. Posterior inferior cerebellar arteries not demonstrated, possibly congenitally hypoplastic or aplastic. MRA NECK FINDINGS Branching pattern of the brachiocephalic vessels from the arch is normal. No origin stenoses. Both common carotid arteries widely patent to their respective bifurcation. Mild atherosclerotic disease at both carotid bifurcations but no stenosis of the internal carotid artery on either side. 50% stenosis of the external carotid artery on the right. Vertebral artery origins are not well seen because of physiologic motion in the chest. I think the left vertebral artery arises from the arch. In the neck, both  vertebral arteries are widely patent without focal stenosis. IMPRESSION: Acute infarction along the ventral surface of the left medulla. No hemorrhage or swelling. Mild chronic small-vessel ischemic changes elsewhere affecting the brain, fairly typical for age. No carotid bifurcation stenosis other than 50% narrowing of the external carotid artery on the right. Vertebral artery origins can't be accurately evaluated because of physiologic motion. Small left vertebral artery arises directly from the arch. Vertebral arteries both widely patent in the neck to the basilar. Electronically Signed   By: Nelson Chimes M.D.   On: 12/13/2015 15:54  Mr Angiogram Neck W Wo Contrast  12/13/2015  CLINICAL DATA:  Cardiac catheterization yesterday. Awoke with right-sided arm and leg weakness. EXAM: MRI HEAD WITHOUT AND WITH CONTRAST MRA HEAD WITHOUT CONTRAST MRA NECK WITHOUT AND WITH CONTRAST TECHNIQUE: Multiplanar, multiecho pulse sequences of the brain and surrounding structures were obtained without and with intravenous contrast. Angiographic images of the Circle of Willis were obtained using MRA technique without intravenous contrast. Angiographic images of the neck were obtained using MRA technique without and with intravenous contrast. Carotid stenosis measurements (when applicable) are obtained utilizing NASCET criteria, using the distal internal carotid diameter as the denominator. CONTRAST:  14mL MULTIHANCE GADOBENATE DIMEGLUMINE 529 MG/ML IV SOLN COMPARISON:  CT same day FINDINGS: MRI HEAD FINDINGS Diffusion imaging shows acute infarction within the ventral surface of the left medulla. No other acute infarction. Elsewhere the brainstem and cerebellum are normal. The cerebral hemispheres show mild chronic small-vessel ischemic changes of the white matter, fairly typical for age. Small focus of hemosiderin deposition in the right parietal deep white matter. No cortical or large vessel territory infarction. No mass  lesion, acute hemorrhage, hydrocephalus or extra-axial collection. No pituitary mass. No inflammatory sinus disease. Small mastoid effusions bilaterally. After contrast administration, no abnormal enhancement of the brain occurs. MRA HEAD FINDINGS Both internal carotid arteries are widely patent into the brain. The anterior and middle cerebral vessels are patent without proximal stenosis, aneurysm or vascular malformation. Both vertebral arteries are patent with the right being dominant. No basilar stenosis. Anterior inferior cerebellar arteries, superior cerebellar arteries and posterior cerebral arteries appear patent and normal. Posterior inferior cerebellar arteries not demonstrated, possibly congenitally hypoplastic or aplastic. MRA NECK FINDINGS Branching pattern of the brachiocephalic vessels from the arch is normal. No origin stenoses. Both common carotid arteries widely patent to their respective bifurcation. Mild atherosclerotic disease at both carotid bifurcations but no stenosis of the internal carotid artery on either side. 50% stenosis of the external carotid artery on the right. Vertebral artery origins are not well seen because of physiologic motion in the chest. I think the left vertebral artery arises from the arch. In the neck, both vertebral arteries are widely patent without focal stenosis. IMPRESSION: Acute infarction along the ventral surface of the left medulla. No hemorrhage or swelling. Mild chronic small-vessel ischemic changes elsewhere affecting the brain, fairly typical for age. No carotid bifurcation stenosis other than 50% narrowing of the external carotid artery on the right. Vertebral artery origins can't be accurately evaluated because of physiologic motion. Small left vertebral artery arises directly from the arch. Vertebral arteries both widely patent in the neck to the basilar. Electronically Signed   By: Nelson Chimes M.D.   On: 12/13/2015 15:54   Mr Jeri Cos [id number]  Contrast  12/13/2015  CLINICAL DATA:  Cardiac catheterization yesterday. Awoke with right-sided arm and leg weakness. EXAM: MRI HEAD WITHOUT AND WITH CONTRAST MRA HEAD WITHOUT CONTRAST MRA NECK WITHOUT AND WITH CONTRAST TECHNIQUE: Multiplanar, multiecho pulse sequences of the brain and surrounding structures were obtained without and with intravenous contrast. Angiographic images of the Circle of Willis were obtained using MRA technique without intravenous contrast. Angiographic images of the neck were obtained using MRA technique without and with intravenous contrast. Carotid stenosis measurements (when applicable) are obtained utilizing NASCET criteria, using the distal internal carotid diameter as the denominator. CONTRAST:  49mL MULTIHANCE GADOBENATE DIMEGLUMINE 529 MG/ML IV SOLN COMPARISON:  CT same day FINDINGS: MRI HEAD FINDINGS Diffusion imaging shows acute infarction within the ventral surface of  the left medulla. No other acute infarction. Elsewhere the brainstem and cerebellum are normal. The cerebral hemispheres show mild chronic small-vessel ischemic changes of the white matter, fairly typical for age. Small focus of hemosiderin deposition in the right parietal deep white matter. No cortical or large vessel territory infarction. No mass lesion, acute hemorrhage, hydrocephalus or extra-axial collection. No pituitary mass. No inflammatory sinus disease. Small mastoid effusions bilaterally. After contrast administration, no abnormal enhancement of the brain occurs. MRA HEAD FINDINGS Both internal carotid arteries are widely patent into the brain. The anterior and middle cerebral vessels are patent without proximal stenosis, aneurysm or vascular malformation. Both vertebral arteries are patent with the right being dominant. No basilar stenosis. Anterior inferior cerebellar arteries, superior cerebellar arteries and posterior cerebral arteries appear patent and normal. Posterior inferior cerebellar  arteries not demonstrated, possibly congenitally hypoplastic or aplastic. MRA NECK FINDINGS Branching pattern of the brachiocephalic vessels from the arch is normal. No origin stenoses. Both common carotid arteries widely patent to their respective bifurcation. Mild atherosclerotic disease at both carotid bifurcations but no stenosis of the internal carotid artery on either side. 50% stenosis of the external carotid artery on the right. Vertebral artery origins are not well seen because of physiologic motion in the chest. I think the left vertebral artery arises from the arch. In the neck, both vertebral arteries are widely patent without focal stenosis. IMPRESSION: Acute infarction along the ventral surface of the left medulla. No hemorrhage or swelling. Mild chronic small-vessel ischemic changes elsewhere affecting the brain, fairly typical for age. No carotid bifurcation stenosis other than 50% narrowing of the external carotid artery on the right. Vertebral artery origins can't be accurately evaluated because of physiologic motion. Small left vertebral artery arises directly from the arch. Vertebral arteries both widely patent in the neck to the basilar. Electronically Signed   By: Nelson Chimes M.D.   On: 12/13/2015 15:54   Dg Chest Port 1 View  12/13/2015  CLINICAL DATA:  Right leg weakness today. Cardiac catheterization yesterday. EXAM: PORTABLE CHEST 1 VIEW COMPARISON:  Chest CT 10/24/2014.  Radiographs 12/11/2015. FINDINGS: Stable cardiomegaly status post CABG. There is mildly increased vascular congestion without overt pulmonary edema. There is no confluent airspace opacity, pneumothorax or significant pleural effusion. The bones appear unchanged. IMPRESSION: Mildly increased vascular congestion without overt pulmonary edema or confluent airspace opacity. Stable cardiomegaly. Electronically Signed   By: Richardean Sale M.D.   On: 12/13/2015 07:59     CBC  Recent Labs Lab 01/09/16 0704  01/10/16 0127  WBC 7.2 9.9  HGB 10.3* 8.2*  HCT 31.9* 25.7*  PLT 271 262  MCV 85.1 85.1  MCH 27.5 27.2  MCHC 32.3 31.9  RDW 17.1* 17.0*  LYMPHSABS 0.6*  --   MONOABS 0.3  --   EOSABS 0.0  --   BASOSABS 0.0  --     Chemistries   Recent Labs Lab 01/09/16 0704 01/10/16 0127  NA 133* 137  K 4.4 4.0  CL 103 107  CO2 20* 20*  GLUCOSE 325* 351*  BUN 21* 23*  CREATININE 1.31* 1.20  CALCIUM 9.4 9.2  AST 21 26  ALT 20 18  ALKPHOS 73 60  BILITOT 1.0 0.6   ------------------------------------------------------------------------------------------------------------------ estimated creatinine clearance is 49.4 mL/min (by C-G formula based on Cr of 1.2). ------------------------------------------------------------------------------------------------------------------ No results for input(s): HGBA1C in the last 72 hours. ------------------------------------------------------------------------------------------------------------------ No results for input(s): CHOL, HDL, LDLCALC, TRIG, CHOLHDL, LDLDIRECT in the last 72 hours. ------------------------------------------------------------------------------------------------------------------ No results for  input(s): TSH, T4TOTAL, T3FREE, THYROIDAB in the last 72 hours.  Invalid input(s): FREET3 ------------------------------------------------------------------------------------------------------------------ No results for input(s): VITAMINB12, FOLATE, FERRITIN, TIBC, IRON, RETICCTPCT in the last 72 hours.  Coagulation profile  Recent Labs Lab 01/09/16 1926  INR 1.48    No results for input(s): DDIMER in the last 72 hours.  Cardiac Enzymes  Recent Labs Lab 01/09/16 1926 01/10/16 0127 01/10/16 0755  TROPONINI 1.86* 2.98* 2.21*   ------------------------------------------------------------------------------------------------------------------ Invalid input(s): POCBNP   CBG:  Recent Labs Lab 01/09/16 1946  01/09/16 2132 01/09/16 2324 01/10/16 0203 01/10/16 0733  GLUCAP 397* 337* 314* 294* 281*       Studies: Dg Chest 2 View  01/09/2016  CLINICAL DATA:  Cough and shortness of breath for a week. EXAM: CHEST  2 VIEW COMPARISON:  January 04, 2016. FINDINGS: Stable cardiomegaly. Status post coronary artery bypass graft. No pneumothorax or significant pleural effusion is noted. There is interval development of right middle and lower lobe airspace opacities concerning for pneumonia. Left lung is clear. Bony thorax is unremarkable. IMPRESSION: Findings consistent with right middle and lower lobe pneumonia. Continued radiographic follow-up is recommended to ensure resolution. Electronically Signed   By: Marijo Conception, M.D.   On: 01/09/2016 08:08      Lab Results  Component Value Date   HGBA1C 6.8* 12/14/2015   Lab Results  Component Value Date   LDLCALC 53 12/14/2015   CREATININE 1.20 01/10/2016       Scheduled Meds: . aspirin  81 mg Oral Daily  . benzonatate  100 mg Oral TID  . ceFEPime (MAXIPIME) IV  2 g Intravenous Q24H  . Chlorhexidine Gluconate Cloth  6 each Topical Q0600  . clopidogrel  75 mg Oral Daily  . cycloSPORINE  1 drop Both Eyes BID  . insulin aspart  0-15 Units Subcutaneous TID WC  . insulin aspart  0-5 Units Subcutaneous QHS  . ipratropium-albuterol  3 mL Nebulization TID  . ketotifen  1 drop Both Eyes BID  . latanoprost  1 drop Both Eyes QHS  . metoprolol succinate  50 mg Oral Daily  . mupirocin ointment  1 application Nasal BID  . oxybutynin  5 mg Oral Daily  . pantoprazole  80 mg Oral Q1200  . rosuvastatin  20 mg Oral q1800  . vancomycin  1,250 mg Intravenous Q24H   Continuous Infusions: . sodium chloride Stopped (01/09/16 1900)    Principal Problem:   Sepsis ([place]) Active Problems:   CAD (coronary artery disease) of artery bypass graft   Essential hypertension   DM2 (diabetes mellitus, type 2) (HCC)   Aortic valve stenosis   COPD (chronic  obstructive pulmonary disease) (HCC)   Stroke, acute, thrombotic (HCC)   Acute respiratory failure ([place])   HCAP (healthcare-associated pneumonia)   CKD (chronic kidney disease)    Time spent: 45 minutes   [facility] Pager [phone]. If 7PM-7AM, please contact night-coverage at [email or web address], password [facility] 01/10/2016, 10:11 AM  LOS: 1 day

## 2016-01-10 NOTE — Progress Notes (Signed)
Pt did not get  Dinner tray.  Service response center called.

## 2016-01-10 NOTE — Consult Note (Signed)
Primary Physician: Primary Cardiologist:  Douglas Carrillo    HPI:Pt is an 82 yo who is followed by Douglas Carrillo  Hx of CAD (s/p CABG in 2000  LIMA to the LAD, vein to the diagonal, vein to the intermediate, and vein to the PDA). In January 2007 cardiac catheterization was done due to exertional dyspnea and mild abnormality on nuclear imaging. This revealed patent vein grafts to the diagonal, marginal, and PDA, but he had an atretic LIMA graft to the LAD with evidence for 60% narrowing in the proximal left subclavian. He also developed moderate aortic stenosis. A nuclear perfusion study in May 2013 showed normal perfusion. Echo in 2015 Normal LVEF Mean gradient across AV 27 mm Hg  Very mild M Cath on December 12 2015 Showed Ost LM lesion, 20% stenosed.  1st Diag lesion, 100% stenosed.  Lat Ramus lesion, 80% stenosed.  Ost RCA to Prox RCA lesion, 95% stenosed.  Prox RCA lesion, 80% stenosed.  SVG was injected is normal in caliber, and is anatomically normal.  RPDA lesion, 20% stenosed.  Ost LAD to Prox LAD lesion, 50% stenosed.  was injected is normal in caliber, and is anatomically normal.  SVG was injected is normal in caliber, and is anatomically normal.  LIMA was injected is normal in caliber, and is anatomically normal.  R heart cath showed mod to severe pulm HTN  PAP 70/22.  Mean gradient across AV of 30 mm Hg  LVEF 30 to 35%  Plan to refer to surgery for possible TAVR    He returned to hosp with CVA on 12/21  (L brainstem; felt embolic)  Underwent rehab [facility]  Discharged on 1/6 After D/C pt reported cnotinued coughing  Pt says cough bad the day before yesterday   Rx steroids as outpt.  Also fever  No CP    Initial trop 0.07  Increased to peak 2.98  Now 2.21    ON 1/17 admitted for  SOB, cough and fever     ABX staerted     Currently CP free   Breathing is short at times  Still some cough      Past Medical History  Diagnosis Date  . Hypertension   . High cholesterol     . Prostate cancer ([place])   . CAD (coronary artery disease)   . OSA (obstructive sleep apnea)   . COPD (chronic obstructive pulmonary disease) (HCC)     MODERATE  . Diabetes mellitus ([place])   . GERD (gastroesophageal reflux disease)   . Vertigo   . Arthritis     OA  . Anemia   . CHF (congestive heart failure) (HCC)     grade 2 diastolic dysfuction  . CKD (chronic kidney disease)   . Stroke (cerebrum) ([place])     11/2015  . HCAP (healthcare-associated pneumonia)     12/2015    Medications Prior to Admission  Medication Sig Dispense Refill  . aspirin 81 MG tablet Take 81 mg by mouth daily.    Marland Kitchen b complex vitamins capsule Take 1 capsule by mouth daily.    . benzonatate (TESSALON) 100 MG capsule Take 100 mg by mouth 3 (three) times daily.  1  . clopidogrel (PLAVIX) 75 MG tablet TAKE 1 TABLET DAILY 90 tablet 3  . CRESTOR 20 MG tablet Take 20 mg by mouth daily.     . furosemide (LASIX) 40 MG tablet TAKE 1 TABLET DAILY 90 tablet 0  . isosorbide mononitrate (IMDUR) 60 MG  24 hr tablet Take 0.5 tablets (30 mg total) by mouth daily. 135 tablet 0  . ketotifen (THERA TEARS ALLERGY) 0.025 % ophthalmic solution Place 1 drop into both eyes 2 (two) times daily.     Marland Kitchen latanoprost (XALATAN) 0.005 % ophthalmic solution Place 1 drop into both eyes at bedtime.    . metoprolol succinate (TOPROL-XL) 100 MG 24 hr tablet Take 1/2 tablet daily  0  . Multiple Vitamins-Minerals (CENTRUM SILVER ULTRA MENS PO) Take 1 tablet by mouth daily.    Marland Kitchen NEXIUM 40 MG capsule Take 40 mg by mouth daily.     Marland Kitchen oxybutynin (DITROPAN-XL) 5 MG 24 hr tablet Take 5 mg by mouth daily.     . pioglitazone (ACTOS) 45 MG tablet Take 45 mg by mouth daily.    . potassium chloride SA (KLOR-CON M20) 20 MEQ tablet Take 3 tablets daily. (Patient taking differently: Take 20 mEq by mouth 3 (three) times daily. Take 3 tablets daily.) 270 tablet 0  . predniSONE (DELTASONE) 10 MG tablet Take 10 mg by mouth taper from 4 doses each day to 1 dose and  stop. Last dose today 01/09/16 (10mg )    . PROAIR HFA 108 (90 BASE) MCG/ACT inhaler Inhale into the lungs as needed.    . RESTASIS 0.05 % ophthalmic emulsion Place 1 drop into both eyes 2 (two) times daily.    Marland Kitchen STIOLTO RESPIMAT 2.5-2.5 MCG/ACT AERS USE 2 INHALATIONS ORALLY   DAILY 12 g 3  . VICTOZA 18 MG/3ML SOPN Inject 1.8 mg as directed every morning.     . vitamin C (ASCORBIC ACID) 500 MG tablet Take 500 mg by mouth 2 (two) times daily.    . vitamin E 400 UNIT capsule Take 400 Units by mouth daily.    Marland Kitchen ZETIA 10 MG tablet Take 10 mg by mouth daily.    . B-D ULTRAFINE III SHORT PEN 31G X 8 MM MISC     . insulin aspart (NOVOLOG) 100 UNIT/ML injection Inject 0-9 Units into the skin 3 (three) times daily with meals. (Patient not taking: Reported on 01/08/2016) 10 mL 11  . ONETOUCH DELICA LANCETS 99991111 MISC 1 each by Other route 2 (two) times daily. Use 1 lancet each to check glucose bid    . ONETOUCH VERIO test strip 1 strip by Other route 2 (two) times daily. Use 1 strip to check glucose bid    . Spacer/Aero-Holding Chambers (AEROCHAMBER PLUS FLO-VU) MISC   1  . Vitamin D, Ergocalciferol, (DRISDOL) 50000 UNITS CAPS capsule Take 50,000 Units by mouth every 14 (fourteen) days. Every other week on Sunday       . aspirin  81 mg Oral Daily  . benzonatate  100 mg Oral TID  . ceFEPime (MAXIPIME) IV  2 g Intravenous Q24H  . Chlorhexidine Gluconate Cloth  6 each Topical Q0600  . clopidogrel  75 mg Oral Daily  . cycloSPORINE  1 drop Both Eyes BID  . insulin aspart  0-15 Units Subcutaneous TID WC  . insulin aspart  0-5 Units Subcutaneous QHS  . insulin glargine  15 Units Subcutaneous Daily  . ipratropium-albuterol  3 mL Nebulization TID  . ketotifen  1 drop Both Eyes BID  . latanoprost  1 drop Both Eyes QHS  . metoprolol succinate  50 mg Oral Daily  . mupirocin ointment  1 application Nasal BID  . oxybutynin  5 mg Oral Daily  . pantoprazole  80 mg Oral Q1200  . rosuvastatin  20 mg  Oral q1800  .  vancomycin  1,250 mg Intravenous Q24H    Infusions: . sodium chloride Stopped (01/09/16 1900)    Allergies  Allergen Reactions  . Mold Extract [Trichophyton] Anaphylaxis  . Lipitor [Atorvastatin] Other (See Comments)    Weakness in legs/myalgias    Social History   Social History  . Marital Status: Widowed    Spouse Name: N/A  . Number of Children: N/A  . Years of Education: N/A   Occupational History  . retired    Social History Main Topics  . Smoking status: Former Smoker -- 1.00 packs/day for 30 years    Types: Cigarettes    Quit date: 12/24/1983  . Smokeless tobacco: Never Used     Comment: quit smoking in 1985  . Alcohol Use: 0.0 oz/week    0 Standard drinks or equivalent per week     Comment: rarely  . Drug Use: No  . Sexual Activity: Not on file   Other Topics Concern  . Not on file   Social History Narrative    Family History  Problem Relation Age of Onset  . Kidney disease Brother   . Breast cancer Sister   . Rheum arthritis Mother     REVIEW OF SYSTEMS:  All systems reviewed  Negative to the above problem except as noted above.    PHYSICAL EXAM: Filed Vitals:   01/10/16 0347 01/10/16 0741  BP: 136/69 151/82  Pulse: 91   Temp:  98.3 F (36.8 C)  Resp: 23      Intake/Output Summary (Last 24 hours) at 01/10/16 1133 Last data filed at 01/10/16 0240  Gross per 24 hour  Intake 439.17 ml  Output   1300 ml  Net -860.83 ml    General: Thin  82 yo No respiratory difficulty HEENT: normal Neck: supple. no JVD. Carotids 2+ bilat; no bruits. No lymphadenopathy or thryomegaly appreciated. Cor: PMI nondisplaced. Regular rate & rhythm. Gr III/ VI systolid murmur LSB   Lungs: rhonchi and pops at bases L greater than R   Abdomen: soft, nontender, nondistended. No hepatosplenomegaly. No bruits or masses. Good bowel sounds. Extremities: no cyanosis, clubbing, rash, edema Neuro: alert & oriented x 3, cranial nerves grossly intact. moves all 4  extremities w/o difficulty. Affect pleasant.  ECG:  SR 89 bpm  Occasional PVC    RBBB LAFB  LVH   RVH  ST T wave changes consistent with strain pattern    Results for orders placed or performed during the hospital encounter of 01/09/16 (from the past 24 hour(s))  Urinalysis, Routine w reflex microscopic (not at [facility])     Status: Abnormal   Collection Time: 01/09/16 11:46 AM  Result Value Ref Range   Color, Urine YELLOW YELLOW   APPearance CLEAR CLEAR   Specific Gravity, Urine 1.028 1.005 - 1.030   pH 5.5 5.0 - 8.0   Glucose, UA >1000 (A) NEGATIVE mg/dL   Hgb urine dipstick NEGATIVE NEGATIVE   Bilirubin Urine NEGATIVE NEGATIVE   Ketones, ur 15 (A) NEGATIVE mg/dL   Protein, ur 30 (A) NEGATIVE mg/dL   Nitrite NEGATIVE NEGATIVE   Leukocytes, UA NEGATIVE NEGATIVE  Urine culture     Status: None (Preliminary result)   Collection Time: 01/09/16 11:46 AM  Result Value Ref Range   Specimen Description URINE, CATHETERIZED    Special Requests NONE    Culture NO GROWTH < 24 HOURS    Report Status PENDING   Urine microscopic-add on     Status:  None   Collection Time: 01/09/16 11:46 AM  Result Value Ref Range   Squamous Epithelial / LPF NONE SEEN NONE SEEN   WBC, UA 0-5 0 - 5 WBC/hpf   RBC / HPF NONE SEEN 0 - 5 RBC/hpf   Bacteria, UA NONE SEEN NONE SEEN  MRSA PCR Screening     Status: Abnormal   Collection Time: 01/09/16  3:01 PM  Result Value Ref Range   MRSA by PCR POSITIVE (A) NEGATIVE  Glucose, capillary     Status: Abnormal   Collection Time: 01/09/16  5:29 PM  Result Value Ref Range   Glucose-Capillary 450 (H) 65 - 99 mg/dL  Troponin I (q 6hr x 3)     Status: Abnormal   Collection Time: 01/09/16  7:26 PM  Result Value Ref Range   Troponin I 1.86 (HH) <0.031 ng/mL  Procalcitonin     Status: None   Collection Time: 01/09/16  7:26 PM  Result Value Ref Range   Procalcitonin 5.41 ng/mL  Protime-INR     Status: Abnormal   Collection Time: 01/09/16  7:26 PM  Result Value Ref Range    Prothrombin Time 18.0 (H) 11.6 - 15.2 seconds   INR 1.48 0.00 - 1.49  APTT     Status: None   Collection Time: 01/09/16  7:26 PM  Result Value Ref Range   aPTT 24 24 - 37 seconds  Glucose, capillary     Status: Abnormal   Collection Time: 01/09/16  7:46 PM  Result Value Ref Range   Glucose-Capillary 397 (H) 65 - 99 mg/dL  Glucose, capillary     Status: Abnormal   Collection Time: 01/09/16  9:32 PM  Result Value Ref Range   Glucose-Capillary 337 (H) 65 - 99 mg/dL  Glucose, capillary     Status: Abnormal   Collection Time: 01/09/16 11:24 PM  Result Value Ref Range   Glucose-Capillary 314 (H) 65 - 99 mg/dL  Troponin I (q 6hr x 3)     Status: Abnormal   Collection Time: 01/10/16  1:27 AM  Result Value Ref Range   Troponin I 2.98 (HH) <0.031 ng/mL  Comprehensive metabolic panel     Status: Abnormal   Collection Time: 01/10/16  1:27 AM  Result Value Ref Range   Sodium 137 135 - 145 mmol/L   Potassium 4.0 3.5 - 5.1 mmol/L   Chloride 107 101 - 111 mmol/L   CO2 20 (L) 22 - 32 mmol/L   Glucose, Bld 351 (H) 65 - 99 mg/dL   BUN 23 (H) 6 - 20 mg/dL   Creatinine, Ser 1.20 0.61 - 1.24 mg/dL   Calcium 9.2 8.9 - 10.3 mg/dL   Total Protein 6.6 6.5 - 8.1 g/dL   Albumin 2.4 (L) 3.5 - 5.0 g/dL   AST 26 15 - 41 U/L   ALT 18 17 - 63 U/L   Alkaline Phosphatase 60 38 - 126 U/L   Total Bilirubin 0.6 0.3 - 1.2 mg/dL   GFR calc non Af Amer 53 (L) >60 mL/min   GFR calc Af Amer >60 >60 mL/min   Anion gap 10 5 - 15  CBC     Status: Abnormal   Collection Time: 01/10/16  1:27 AM  Result Value Ref Range   WBC 9.9 4.0 - 10.5 K/uL   RBC 3.02 (L) 4.22 - 5.81 MIL/uL   Hemoglobin 8.2 (L) 13.0 - 17.0 g/dL   HCT 25.7 (L) 39.0 - 52.0 %   MCV 85.1 78.0 -  100.0 fL   MCH 27.2 26.0 - 34.0 pg   MCHC 31.9 30.0 - 36.0 g/dL   RDW 17.0 (H) 11.5 - 15.5 %   Platelets 262 150 - 400 K/uL  Glucose, capillary     Status: Abnormal   Collection Time: 01/10/16  2:03 AM  Result Value Ref Range   Glucose-Capillary 294  (H) 65 - 99 mg/dL  Glucose, capillary     Status: Abnormal   Collection Time: 01/10/16  7:33 AM  Result Value Ref Range   Glucose-Capillary 281 (H) 65 - 99 mg/dL  Troponin I     Status: Abnormal   Collection Time: 01/10/16  7:55 AM  Result Value Ref Range   Troponin I 2.21 (HH) <0.031 ng/mL   Dg Chest 2 View  01/09/2016  CLINICAL DATA:  Cough and shortness of breath for a week. EXAM: CHEST  2 VIEW COMPARISON:  January 04, 2016. FINDINGS: Stable cardiomegaly. Status post coronary artery bypass graft. No pneumothorax or significant pleural effusion is noted. There is interval development of right middle and lower lobe airspace opacities concerning for pneumonia. Left lung is clear. Bony thorax is unremarkable. IMPRESSION: Findings consistent with right middle and lower lobe pneumonia. Continued radiographic follow-up is recommended to ensure resolution. Electronically Signed   By: Marijo Conception, M.D.   On: 01/09/2016 08:08   Dg Swallowing Func-speech Pathology  01/10/2016  Objective Swallowing Evaluation:   Patient Details Name: Douglas Carrillo MRN: [id number] Date of Birth: 28-Sep-1928 Today's Date: 01/10/2016 Time: SLP Start Time (ACUTE ONLY): 0930-SLP Stop Time (ACUTE ONLY): 0945 SLP Time Calculation (min) (ACUTE ONLY): 15 min Past Medical History: Past Medical History Diagnosis Date . Hypertension  . High cholesterol  . Prostate cancer ([place])  . CAD (coronary artery disease)  . OSA (obstructive sleep apnea)  . COPD (chronic obstructive pulmonary disease) (HCC)    MODERATE . Diabetes mellitus ([place])  . GERD (gastroesophageal reflux disease)  . Vertigo  . Arthritis    OA . Anemia  . CHF (congestive heart failure) (HCC)    grade 2 diastolic dysfuction . CKD (chronic kidney disease)  . Stroke (cerebrum) ([place])    11/2015 . HCAP (healthcare-associated pneumonia)    12/2015 Past Surgical History: Past Surgical History Procedure Laterality Date . Coronary artery bypass graft  03/29/1999   x 4:  LIMA to the LAD,SVG  to the diagonal branch of the LAD,SVG to the intermediate coronary artery & SVG to the posterior descending branch of the RCA. . Colon surgery     x 4 . Nm myocar perf wall motion  04/28/2012   Low Risk Scan . Prostate surgery   . Cardiac catheterization   . Cardiac catheterization N/A 12/12/2015   Procedure: Right/Left Heart Cath and Coronary/Graft Angiography;  Surgeon: Troy Sine, MD;  Location: [facility];  Service: Cardiovascular;  Laterality: N/A; . Peripheral vascular catheterization N/A 12/12/2015   Procedure: Aortic Arch Angiography;  Surgeon: Troy Sine, MD;  Location: [facility];  Service: Cardiovascular;  Laterality: N/A; . Eye surgery   HPI: 82 y.o. 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 He is admitted for sepsis secondary to PNA (RML and RLL per imaging report). Subjective: pt alert, denies any difficulties with swallowing Assessment / Plan / Recommendation CHL IP CLINICAL IMPRESSIONS 01/10/2016 Therapy Diagnosis Mild oral phase dysphagia;Suspected primary esophageal dysphagia Clinical Impression Pt demonstrates no significant oropharyngeal dysphagia worrisome for  aspiration risk. There is slow bolus formation with occasional premature spillage to valleculae. Initial sips of nectar showed a delay in swallow initiation, but as study progressed pt was taking consecutive sips of thin liquids with timely swallow. Strength WNL, no penetration or aspiration occurred despite attempt to tax system. Pt did have some delayed coughing; esophageal sweep showed appearence of significant stasis with purees which did not clear with sips of liquid. Pill transited with multiple bites and sips. Discussed esophageal precautions. Recommend regular diet and thin liquids. SLP will f/u for education and tolerance at bedside.  Impact on safety and function Moderate aspiration risk   CHL IP TREATMENT RECOMMENDATION 01/10/2016 Treatment  Recommendations Therapy as outlined in treatment plan below   No flowsheet data found. CHL IP DIET RECOMMENDATION 01/10/2016 SLP Diet Recommendations Regular solids;Thin liquid Liquid Administration via Cup;Straw Medication Administration Whole meds with puree Compensations Slow rate;Small sips/bites;Follow solids with liquid Postural Changes Remain semi-upright after after feeds/meals (Comment);Seated upright at 90 degrees   CHL IP OTHER RECOMMENDATIONS 01/10/2016 Recommended Consults -- Oral Care Recommendations Patient independent with oral care Other Recommendations --   CHL IP FOLLOW UP RECOMMENDATIONS 01/10/2016 Follow up Recommendations None   CHL IP FREQUENCY AND DURATION 01/10/2016 Speech Therapy Frequency (ACUTE ONLY) min 1 x/week Treatment Duration 1 week      CHL IP ORAL PHASE 01/10/2016 Oral Phase Impaired Oral - Pudding Teaspoon -- Oral - Pudding Cup -- Oral - Honey Teaspoon -- Oral - Honey Cup -- Oral - Nectar Teaspoon -- Oral - Nectar Cup Delayed oral transit;Premature spillage Oral - Nectar Straw Delayed oral transit;Premature spillage Oral - Thin Teaspoon -- Oral - Thin Cup Premature spillage Oral - Thin Straw Premature spillage Oral - Puree WFL Oral - Mech Soft -- Oral - Regular WFL Oral - Multi-Consistency -- Oral - Pill -- Oral Phase - Comment --  CHL IP PHARYNGEAL PHASE 01/10/2016 Pharyngeal Phase Impaired Pharyngeal- Pudding Teaspoon -- Pharyngeal -- Pharyngeal- Pudding Cup -- Pharyngeal -- Pharyngeal- Honey Teaspoon -- Pharyngeal -- Pharyngeal- Honey Cup -- Pharyngeal -- Pharyngeal- Nectar Teaspoon -- Pharyngeal -- Pharyngeal- Nectar Cup Delayed swallow initiation-vallecula Pharyngeal -- Pharyngeal- Nectar Straw Delayed swallow initiation-pyriform sinuses Pharyngeal -- Pharyngeal- Thin Teaspoon -- Pharyngeal -- Pharyngeal- Thin Cup WFL Pharyngeal -- Pharyngeal- Thin Straw WFL Pharyngeal -- Pharyngeal- Puree WFL Pharyngeal -- Pharyngeal- Mechanical Soft -- Pharyngeal -- Pharyngeal- Regular WFL  Pharyngeal -- Pharyngeal- Multi-consistency -- Pharyngeal -- Pharyngeal- Pill WFL Pharyngeal -- Pharyngeal Comment --  CHL IP CERVICAL ESOPHAGEAL PHASE 01/10/2016 Cervical Esophageal Phase Impaired Pudding Teaspoon -- Pudding Cup -- Honey Teaspoon -- Honey Cup -- Nectar Teaspoon -- Nectar Cup -- Nectar Straw -- Thin Teaspoon -- Thin Cup -- Thin Straw -- Puree -- Mechanical Soft -- Regular -- Multi-consistency -- Pill -- Cervical Esophageal Comment -- CHL IP GO 12/14/2015 Functional Assessment Tool Used skilled clinical judgement Functional Limitations Motor speech Swallow Current Status [phone]) (None) Swallow Goal Status [id number]) (None) Swallow Discharge Status [id number]) (None) Motor Speech Current Status [id number]) [place] Motor Speech Goal Status [id number]) [place] Motor Speech Goal Status SA:931536) CH Spoken Language Comprehension Current Status [id number]) (None) Spoken Language Comprehension Goal Status [id number]) (None) Spoken Language Comprehension Discharge Status [id number]) (None) Spoken Language Expression Current Status [id number]) (None) Spoken Language Expression Goal Status [id number]) (None) Spoken Language Expression Discharge Status [id number]) (None) Attention Current Status [id number]) (None) Attention Goal Status [id number]) (None) Attention Discharge Status [id number]) (None) Memory Current Status [id number]) (None) Memory Goal Status [id number]) (None) Memory Discharge Status [id number]:115444) (None)  Voice Current Status [phone]) (None) Voice Goal Status [phone]) (None) Voice Discharge Status [phone]) (None) Other Speech-Language Pathology Functional Limitation [phone]) (None) Other Speech-Language Pathology Functional Limitation Goal Status [phone]) (None) Other Speech-Language Pathology Functional Limitation Discharge Status [phone]) (None) Herbie Baltimore, MA CCC-SLP [phone] [place], Katherene Ponto 01/10/2016, 10:15 AM                ASSESSMENT: 82 yo with hx of CAD and mod to severe AS  Recent cath showed patent grafts  Unfort complicated by CVA    Admitted  yesterday with fever, cough  Found to have pneumonia    Pt denies having CP  Has had cough  Exam signif for abnormal lung exam consistent with PNA  Murmur consistent with AS.  No signif edema    EKG is nondiagnostic    Labs signif for  Hypoxia on admit, Lactic acid 2.54  Trop was o.o7, 1.86, 2.9 adn 2.21  Troponin most likely reflects demand ischemia in setting of known CAD, AS with infection/sepsis  They are trending down.    Pt is pain free  I would continue suppportive care   I have cancelled echo  He has had recent eval of LV function and AV   Watch volume     2  Aortic stenosis   Watch BP  I/Os    3.  ID  Per primary team    4  Neuro  Recovering from CVA in December     Will continue to follow   Dorris Carnes

## 2016-01-11 ENCOUNTER — Ambulatory Visit: Payer: Medicare Other | Admitting: Physical Therapy

## 2016-01-11 DIAGNOSIS — I5033 Acute on chronic diastolic (congestive) heart failure: Secondary | ICD-10-CM

## 2016-01-11 DIAGNOSIS — Z8673 Personal history of transient ischemic attack (TIA), and cerebral infarction without residual deficits: Secondary | ICD-10-CM

## 2016-01-11 DIAGNOSIS — J9610 Chronic respiratory failure, unspecified whether with hypoxia or hypercapnia: Secondary | ICD-10-CM

## 2016-01-11 DIAGNOSIS — R1310 Dysphagia, unspecified: Secondary | ICD-10-CM

## 2016-01-11 DIAGNOSIS — Z9981 Dependence on supplemental oxygen: Secondary | ICD-10-CM

## 2016-01-11 DIAGNOSIS — R918 Other nonspecific abnormal finding of lung field: Secondary | ICD-10-CM

## 2016-01-11 DIAGNOSIS — A4901 Methicillin susceptible Staphylococcus aureus infection, unspecified site: Secondary | ICD-10-CM

## 2016-01-11 LAB — COMPREHENSIVE METABOLIC PANEL
ALT: 17 U/L (ref 17–63)
ANION GAP: 6 (ref 5–15)
AST: 21 U/L (ref 15–41)
Albumin: 2.3 g/dL — ABNORMAL LOW (ref 3.5–5.0)
Alkaline Phosphatase: 62 U/L (ref 38–126)
BUN: 20 mg/dL (ref 6–20)
CHLORIDE: 110 mmol/L (ref 101–111)
CO2: 24 mmol/L (ref 22–32)
Calcium: 9.1 mg/dL (ref 8.9–10.3)
Creatinine, Ser: 1.13 mg/dL (ref 0.61–1.24)
GFR, EST NON AFRICAN AMERICAN: 57 mL/min — AB (ref >60)
Glucose, Bld: 184 mg/dL — ABNORMAL HIGH (ref 65–99)
POTASSIUM: 3.7 mmol/L (ref 3.5–5.1)
Sodium: 140 mmol/L (ref 135–145)
Total Bilirubin: 0.7 mg/dL (ref 0.3–1.2)
Total Protein: 6.1 g/dL — ABNORMAL LOW (ref 6.5–8.1)

## 2016-01-11 LAB — CBC
HCT: 26.3 % — ABNORMAL LOW (ref 39.0–52.0)
Hemoglobin: 8.5 g/dL — ABNORMAL LOW (ref 13.0–17.0)
MCH: 28 pg (ref 26.0–34.0)
MCHC: 32.3 g/dL (ref 30.0–36.0)
MCV: 86.5 fL (ref 78.0–100.0)
PLATELETS: 282 10*3/uL (ref 150–400)
RBC: 3.04 MIL/uL — ABNORMAL LOW (ref 4.22–5.81)
RDW: 17.2 % — AB (ref 11.5–15.5)
WBC: 9.3 10*3/uL (ref 4.0–10.5)

## 2016-01-11 LAB — GLUCOSE, CAPILLARY
GLUCOSE-CAPILLARY: 173 mg/dL — AB (ref 65–99)
GLUCOSE-CAPILLARY: 193 mg/dL — AB (ref 65–99)
GLUCOSE-CAPILLARY: 211 mg/dL — AB (ref 65–99)
GLUCOSE-CAPILLARY: 212 mg/dL — AB (ref 65–99)
Glucose-Capillary: 304 mg/dL — ABNORMAL HIGH (ref 65–99)

## 2016-01-11 MED ORDER — CEFAZOLIN SODIUM-DEXTROSE 2-3 GM-% IV SOLR
2.0000 g | INTRAVENOUS | Status: DC
  Filled 2016-01-11: qty 50

## 2016-01-11 MED ORDER — VANCOMYCIN HCL IN DEXTROSE 750-5 MG/150ML-% IV SOLN
750.0000 mg | Freq: Two times a day (BID) | INTRAVENOUS | Status: DC
  Administered 2016-01-11 – 2016-01-13 (×5): 750 mg via INTRAVENOUS
  Filled 2016-01-11 (×6): qty 150

## 2016-01-11 MED ORDER — FUROSEMIDE 40 MG PO TABS
40.0000 mg | ORAL_TABLET | Freq: Every day | ORAL | Status: DC
  Administered 2016-01-12 – 2016-01-16 (×5): 40 mg via ORAL
  Filled 2016-01-11 (×5): qty 1

## 2016-01-11 MED ORDER — CEFAZOLIN SODIUM-DEXTROSE 2-3 GM-% IV SOLR
2.0000 g | Freq: Three times a day (TID) | INTRAVENOUS | Status: DC
  Administered 2016-01-11 – 2016-01-12 (×3): 2 g via INTRAVENOUS
  Filled 2016-01-11 (×5): qty 50

## 2016-01-11 MED ORDER — CEFAZOLIN SODIUM-DEXTROSE 2-3 GM-% IV SOLR
2.0000 g | Freq: Three times a day (TID) | INTRAVENOUS | Status: DC
  Filled 2016-01-11 (×2): qty 50

## 2016-01-11 MED ORDER — DEXTROSE 5 % IV SOLN
2.0000 g | Freq: Two times a day (BID) | INTRAVENOUS | Status: DC
  Filled 2016-01-11: qty 2

## 2016-01-11 MED ORDER — FUROSEMIDE 10 MG/ML IJ SOLN
40.0000 mg | Freq: Once | INTRAMUSCULAR | Status: AC
  Administered 2016-01-11: 40 mg via INTRAVENOUS
  Filled 2016-01-11: qty 4

## 2016-01-11 MED ORDER — POTASSIUM CHLORIDE ER 10 MEQ PO TBCR
20.0000 meq | EXTENDED_RELEASE_TABLET | Freq: Once | ORAL | Status: AC
  Administered 2016-01-11: 20 meq via ORAL
  Filled 2016-01-11 (×2): qty 2

## 2016-01-11 NOTE — Evaluation (Signed)
Physical Therapy Evaluation Patient Details Name: Douglas Carrillo MRN: [id number] DOB: [birth year] Today's Date: 01/11/2016   History of Present Illness  Pt adm with PNA. PMH - recent lt CVA, HTN, DM, CAD, CABG,   Clinical Impression  Pt admitted with above diagnosis and presents to PT with functional limitations due to deficits listed below (See PT problem list). Pt needs skilled PT to maximize independence and safety to allow discharge to CIR.      Follow Up Recommendations CIR    Equipment Recommendations  None recommended by PT    Recommendations for Other Services       Precautions / Restrictions Precautions Precautions: Fall Restrictions Weight Bearing Restrictions: No      Mobility  Bed Mobility Overal bed mobility: Needs Assistance Bed Mobility: Sidelying to Sit   Sidelying to sit: Mod assist       General bed mobility comments: Assist to elevate trunk and bring hips to EOB  Transfers Overall transfer level: Needs assistance Equipment used: Rolling walker (2 wheeled) Transfers: Sit to/from Stand Sit to Stand: Mod assist;+2 safety/equipment         General transfer comment: Assist to bring trunk up and for balance  Ambulation/Gait Ambulation/Gait assistance: Min assist;+2 safety/equipment Ambulation Distance (Feet): 10 Feet Assistive device: Rolling walker (2 wheeled) Gait Pattern/deviations: Step-through pattern;Decreased step length - right;Decreased step length - left;Shuffle;Narrow base of support Gait velocity: decr Gait velocity interpretation: <1.8 ft/sec, indicative of risk for recurrent falls General Gait Details: Assist for balancen  Stairs            Wheelchair Mobility    Modified Rankin (Stroke Patients Only)       Balance Overall balance assessment: Needs assistance Sitting-balance support: No upper extremity supported;Feet supported Sitting balance-Leahy Scale: Good     Standing balance support: Bilateral upper  extremity supported Standing balance-Leahy Scale: Poor Standing balance comment: walker and min A for static standing                             Pertinent Vitals/Pain Pain Assessment: No/denies pain    Home Living Family/patient expects to be discharged to:: Private residence Living Arrangements: Children (since return home after CVA) Available Help at Discharge: Available 24 hours/day;Family Type of Home: House Home Access: Stairs to enter Entrance Stairs-Rails: None Entrance Stairs-Number of Steps: 2 Home Layout: One level Home Equipment: Walker - 2 wheels;Cane - single point      Prior Function Level of Independence: Needs assistance   Gait / Transfers Assistance Needed: Assist with amb with rolling walker since CVA     Comments: Prior to CVA pt independent active and driving     Hand Dominance   Dominant Hand: Right    Extremity/Trunk Assessment   Upper Extremity Assessment: Defer to OT evaluation           Lower Extremity Assessment: Generalized weakness;RLE deficits/detail RLE Deficits / Details: grossly 3-/5       Communication   Communication: No difficulties  Cognition Arousal/Alertness: Awake/alert Behavior During Therapy: WFL for tasks assessed/performed Overall Cognitive Status: Within Functional Limits for tasks assessed                      General Comments      Exercises        Assessment/Plan    PT Assessment Patient needs continued PT services  PT Diagnosis Difficulty walking;Generalized weakness   PT Problem List Decreased  strength;Decreased activity tolerance;Decreased balance;Decreased mobility  PT Treatment Interventions DME instruction;Gait training;Functional mobility training;Therapeutic activities;Therapeutic exercise;Balance training;Patient/family education   PT Goals (Current goals can be found in the Care Plan section) Acute Rehab PT Goals Patient Stated Goal: return home PT Goal Formulation: With  patient/family Time For Goal Achievement: 01/18/16 Potential to Achieve Goals: Good    Frequency Min 3X/week   Barriers to discharge        Co-evaluation               End of Session Equipment Utilized During Treatment: Gait belt;Oxygen Activity Tolerance: Patient limited by fatigue Patient left: in chair;with call bell/phone within reach;with chair alarm set;with family/visitor present Nurse Communication: Mobility status         Time: 1432-1450 PT Time Calculation (min) (ACUTE ONLY): 18 min   Charges:   PT Evaluation $PT Eval Moderate Complexity: 1 Procedure     PT G Codes:        Sakshi Sermons 01/26/16, 4:20 PM [employer] PT [phone]

## 2016-01-11 NOTE — Progress Notes (Signed)
Inpatient Diabetes Program Recommendations  AACE/ADA: New Consensus Statement on Inpatient Glycemic Control (2015)  Target Ranges:  Prepandial:   less than 140 mg/dL      Peak postprandial:   less than 180 mg/dL (1-2 hours)      Critically ill patients:  140 - 180 mg/dL  Results for JOSON, GAYHEART (MRN [id number]) as of 01/11/2016 09:40  Ref. Range 01/10/2016 07:33 01/10/2016 11:45 01/10/2016 15:26 01/10/2016 21:27 01/11/2016 00:12 01/11/2016 08:05  Glucose-Capillary Latest Ref Range: 65-99 mg/dL 281 (H) 301 (H) 263 (H) 207 (H) 212 (H) 173 (H)   Review of Glycemic Control  Current orders for Inpatient glycemic control: Lantus 15 units daily, Novolog 0-15 units TID with meals, Novolog 0-5 units HS  Inpatient Diabetes Program Recommendations: Diet: Please consider changing diet from Regular to Carb Modified diet.  Thanks, Barnie Alderman, RN, MSN, CDE Diabetes Coordinator Inpatient Diabetes Program [phone] (Team Pager from [place] to [place]) [phone] ([facility] office) [phone] [facility] office) [phone] [facility] office)

## 2016-01-11 NOTE — Progress Notes (Signed)
Subjective: Pt coughing this AM   A little more SOB  NoCP  Objective: Filed Vitals:   01/10/16 2127 01/10/16 2323 01/11/16 0543 01/11/16 0723  BP:  125/54 154/76   Pulse:  95 98   Temp:  97.7 F (36.5 C) 99 F (37.2 C)   TempSrc:  Oral Oral   Resp: 25 26 22    Height:  6' (1.829 m)    Weight:  84.2 kg (185 lb 10 oz) 83.915 kg (185 lb)   SpO2: 92% 97% 100% 99%   Weight change: 1.6 kg (3 lb 8.4 oz)  Intake/Output Summary (Last 24 hours) at 01/11/16 0824 Last data filed at 01/11/16 [id number]  Gross per 24 hour  Intake 1884.16 ml  Output    750 ml  Net 1134.16 ml    General: Alert, awake, oriented x3, in no acute distress Neck:  JVP is normal Heart: Regular rate and rhythm, Gr III/VI systolic murmur , rubs, gallops.  Lungs: Mild rhonchi bilaterally  Exemities:  Tr  edema.   Neuro: Grossly intact, nonfocal.  Tle:  SR/ ST    Lab Results: Results for orders placed or performed during the hospital encounter of 01/09/16 (from the past 24 hour(s))  Glucose, capillary     Status: Abnormal   Collection Time: 01/10/16 11:45 AM  Result Value Ref Range   Glucose-Capillary 301 (H) 65 - 99 mg/dL  Troponin I     Status: Abnormal   Collection Time: 01/10/16  2:12 PM  Result Value Ref Range   Troponin I 1.64 (HH) <0.031 ng/mL  Glucose, capillary     Status: Abnormal   Collection Time: 01/10/16  3:26 PM  Result Value Ref Range   Glucose-Capillary 263 (H) 65 - 99 mg/dL  Glucose, capillary     Status: Abnormal   Collection Time: 01/10/16  9:27 PM  Result Value Ref Range   Glucose-Capillary 207 (H) 65 - 99 mg/dL  Glucose, capillary     Status: Abnormal   Collection Time: 01/11/16 12:12 AM  Result Value Ref Range   Glucose-Capillary 212 (H) 65 - 99 mg/dL  CBC     Status: Abnormal   Collection Time: 01/11/16  6:04 AM  Result Value Ref Range   WBC 9.3 4.0 - 10.5 K/uL   RBC 3.04 (L) 4.22 - 5.81 MIL/uL   Hemoglobin 8.5 (L) 13.0 - 17.0 g/dL   HCT 26.3 (L) 39.0 - 52.0 %   MCV 86.5 78.0  - 100.0 fL   MCH 28.0 26.0 - 34.0 pg   MCHC 32.3 30.0 - 36.0 g/dL   RDW 17.2 (H) 11.5 - 15.5 %   Platelets 282 150 - 400 K/uL  Comprehensive metabolic panel     Status: Abnormal   Collection Time: 01/11/16  6:04 AM  Result Value Ref Range   Sodium 140 135 - 145 mmol/L   Potassium 3.7 3.5 - 5.1 mmol/L   Chloride 110 101 - 111 mmol/L   CO2 24 22 - 32 mmol/L   Glucose, Bld 184 (H) 65 - 99 mg/dL   BUN 20 6 - 20 mg/dL   Creatinine, Ser 1.13 0.61 - 1.24 mg/dL   Calcium 9.1 8.9 - 10.3 mg/dL   Total Protein 6.1 (L) 6.5 - 8.1 g/dL   Albumin 2.3 (L) 3.5 - 5.0 g/dL   AST 21 15 - 41 U/L   ALT 17 17 - 63 U/L   Alkaline Phosphatase 62 38 - 126 U/L   Total Bilirubin 0.7 0.3 -  1.2 mg/dL   GFR calc non Af Amer 57 (L) >60 mL/min   GFR calc Af Amer >60 >60 mL/min   Anion gap 6 5 - 15  Glucose, capillary     Status: Abnormal   Collection Time: 01/11/16  8:05 AM  Result Value Ref Range   Glucose-Capillary 173 (H) 65 - 99 mg/dL     Medications:  @PROBHOSP @  1  Trop  Trending down   Reflects demand ischemia in setting of known CAD, AS and sepsis Follow  2.  Acute on chronic diastolic CHF  Pt with some increase in volume  Will dose with lasix x 1 IV then po in AM    3.  AS  Severe  Watch BP , I/O  No plan for intervention now.  4.  ID  Continues ABX  5  Neuro  Recent CVA      LOS: 2 days   Dorris Carnes 01/11/2016, 8:24 AM

## 2016-01-11 NOTE — Progress Notes (Signed)
Speech Language Pathology Treatment: Dysphagia  Patient Details Name: Douglas Carrillo MRN: [id number] DOB: 02/11/1928 Today's Date: 01/11/2016 Time: [id number] SLP Time Calculation (min) (ACUTE ONLY): 18 min  Assessment / Plan / Recommendation Clinical Impression  Pt seen for f/u after MBS yesterday showed excellent airway protection with thin liquids but signs of a possible esophageal dysphagia. Today pt is having constant hard coughing all morning, prior to and immediately after PO. Pts daughter reports he has had very little to eat or drink yesterday or today. However, timing of increased coughing following liberalization of diet is very concerning. Though no significant deficits with oropharyngeal phase were observed yesterday, today pts SOB increases risk. Will downgrade diet back to nectar to reduce risk. Will monitor for progress. Suggest esophageal assessment/GI consult given findings of esophageal stasis and concern for risk of postprandial aspiration.    HPI HPI: 82 y.o. 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 He is admitted for sepsis secondary to PNA (RML and RLL per imaging report).      SLP Plan  Continue with current plan of care     Recommendations  Diet recommendations: Dysphagia 3 (mechanical soft);Nectar-thick liquid Liquids provided via: Cup Medication Administration: Whole meds with puree Supervision: Patient able to self feed Compensations: Slow rate;Small sips/bites;Follow solids with liquid Postural Changes and/or Swallow Maneuvers: Seated upright 90 degrees;Upright 30-60 min after meal             Oral Care Recommendations: Oral care BID Follow up Recommendations: 24 hour supervision/assistance Plan: Continue with current plan of care     Tempe Leverett Camplin, MA CCC-SLP [id number]  Lynann Beaver 01/11/2016, 12:12 PM

## 2016-01-11 NOTE — Progress Notes (Signed)
Patient transferred from [place] to [place], alert and orient x4.  Patient is on contact and droplet isolation.  Patient on 2L O2, bed rails up x2, call bell and phone in reach, bed in low position, and patient oriented to room and unit.

## 2016-01-11 NOTE — Progress Notes (Addendum)
Pharmacy Antibiotic Follow-up Note  Douglas Carrillo is a 82 y.o. year-old male admitted on 01/09/2016.  The patient is currently on day 3 of Vancomycin/Cefepime for gram positive bateremia and pneumonia. 2/2 BCx grew out GPC in clusters. WBC remains wnl. Afebrile. CrCl improved to 50-55 mL/min.   Assessment/Plan: -Increase Vancomycin to 750 mg IV Q 12 hours and Cefepime to 2 gm IV Q 12 hours -Monitor CBC, renal fx, and cultures -Patient will likely need ECHO to r/o vegetation   Temp (24hrs), Avg:98.3 F (36.8 C), Min:97.7 F (36.5 C), Max:99 F (37.2 C)   Recent Labs Lab 01/09/16 0704 01/10/16 0127 01/11/16 0604  WBC 7.2 9.9 9.3    Recent Labs Lab 01/09/16 0704 01/10/16 0127 01/11/16 0604  CREATININE 1.31* 1.20 1.13   Estimated Creatinine Clearance: 52.5 mL/min (by C-G formula based on Cr of 1.13).    Allergies  Allergen Reactions  . Mold Extract [Trichophyton] Anaphylaxis  . Lipitor [Atorvastatin] Other (See Comments)    Weakness in legs/myalgias    Antimicrobials this admission: 1/17 vanc>> 1/17 cefepime>>  Levels/dose changes this admission: None   Microbiology results: 1/17 BCx2>> GPC in clusters in 2/2  1/17 UC>> NGF  1/17 MRSA PCR: positive   Thank you for allowing pharmacy to be a part of this patient's care.  Albertina Parr, PharmD., BCPS Clinical Pharmacist Pager [phone]   Cefepime stopped and pharmacy consulted to start Ancef. Will being Cefazolin 2 grams IV Q 8 hours.    Vincenza Hews, PharmD, BCPS 01/11/2016, 2:31 PM Pager: [phone]

## 2016-01-11 NOTE — Progress Notes (Addendum)
[facility] PROGRESS NOTE  Douglas Carrillo [id number] DOB: Aug 22, 1928 DOA: 01/09/2016 PCP: Limmie Patricia, MD  Length of stay: 2   Assessment/Plan: Principal Problem:   Sepsis ([place]) Active Problems:   CAD (coronary artery disease) of artery bypass graft   Essential hypertension   DM2 (diabetes mellitus, type 2) (HCC)   Aortic valve stenosis   COPD (chronic obstructive pulmonary disease) (HCC)   Stroke, acute, thrombotic ([place])   Acute respiratory failure ([place])   HCAP (healthcare-associated pneumonia)   CKD (chronic kidney disease)     82 y.o. male with a past medical historyHTN, HLD, CAD, OSA, COPD, DM, GERD, CHF, CVA, prostate cancer who presents with sepsis secondary to HCAP.   on oxygen at night, severe aortic stenosis and recent CVA and 12/12/15 ( Dr. Leonie Man consulted and felt that stroke due to sequela of cardiac cath and to continue Plavix for secondary stroke prevention. Carotid dopplers without significant ICA stenosis.) presents to emergency department with a persistent gradual worsening shortness of breath/cough and fever. Initial evaluation reveals sepsis and acute respiratory failure secondary to pneumonia.   Patient reports he has been home from hospital for 10 days and has been coughing the entire time. Went to see [place] PCP and was provided with IM steroids and by mouth steroid taper which he finished. Reports today cough has become worse it sounds wet but is nonproductive. Associated symptoms include fever decreased appetite generalized weakness. He denies chest pain palpitations abdominal pain nausea vomiting diarrhea. He denies dysuria hematuria frequency or urgency. He denies lower extremity edema or orthopnea.      Assessment and plan #1. Sepsis with staph aureus bacteremia, likely related to healthcare associated pneumonia  ,right middle and lower lobe pneumonia . Max temp 102.8, lactic acid 2.54, heart heart rate 112 respiratory rate 30 blood  pressure stable. ABG with pH of 7.47, PCO2 27.4, PO2 79.0, bicarbonate 19.8. He received 2 L of fluid in the emergency department and antibiotics initiated Continue telemetry -Continue cefepime and vancomycin, day #3 Initial lactic acid 1.45, pro calcitonin 5.41, influenza PCR negative, Blood cultures positive growing staph aureus in all 4 bottles, follow sensitivity panel, currently on 2 L of nasal cannula Speech therapy evaluation recommends dysphagia diet with nectar thick liquids   #2. Acute respiratory failure with hypoxia  likely related to pneumonia. ABG as noted above. Oxygen saturation level 85% on room air. Improved now  99% on 2 L nasal cannula. He wears oxygen at home only at night. Recently completed a prednisone taper provided by PCP -Continue oxygen supplementation -Nebulizers -Antibiotics per healthcare associated pneumonia protocol -Wean oxygen as able   #3. Healthcare associated pneumonia. Chest x-ray consistent with right middle and lower lobe pneumonia. Some concern for aspiration. See #1   #4. Recent history of stroke. See discharge summary dated January 6. Spent 1 week inpatient rehabilitation. Review indicates some right upper and right lower extremity weakness. -Appears stable at baseline -Physical therapy -Continue Plavix/aspirin  #5. Hypertension. Blood pressure controlled in the emergency department. Home medications include Lasix, imdur, Toprol. Will hold Lasix and imdur -Monitor closely  #6. Diabetes. Uncontrolled, 294-397  Increase  Lantus in addition to SSI Hold Actos  #7. Chronic kidney disease stage II. Creatinine 1.31 on admission. This appears to be close to his baseline -Monitor intake and output -Check in the morning  #8. CAD. With abnormal troponin, likely demand ischemia, No chest pain. EKG as above. Initial troponin negative Troponin peaked at  2.98,>  2.21>1.64 Continue aspirin and Plavix Cardiology consultation recommends conservative mx   No need for  2-D echo per cards    #9. Aortic stenosis/history of diastolic dysfunction. Echo with EF of [id number] grade 2 diastolic dysfunction. ENP 338. Does not appear overloaded -We'll hold his home Lasix for now, continue metoprolol       DVT prophylaxsis scd's  Code Status:      Code Status Orders        Start     Ordered   01/09/16 1429  Full code   Continuous     01/09/16 1428    Code Status History      Family Communication: Discussed in detail with the patient, all imaging results, lab results explained to the patient   Disposition Plan:  Continue telemetry, PT/OT consultation    Consultants:  None   Procedures:  None   Antibiotics: Anti-infectives    Start     Dose/Rate Route Frequency Ordered Stop   01/11/16 2000  ceFEPIme (MAXIPIME) 2 g in dextrose 5 % 50 mL IVPB     2 g 100 mL/hr over 30 Minutes Intravenous Every 12 hours 01/11/16 1243     01/11/16 1800  vancomycin (VANCOCIN) IVPB 750 mg/150 ml premix     750 mg 150 mL/hr over 60 Minutes Intravenous Every 12 hours 01/11/16 1242     01/10/16 0730  ceFEPIme (MAXIPIME) 2 g in dextrose 5 % 50 mL IVPB  Status:  Discontinued     2 g 100 mL/hr over 30 Minutes Intravenous Every 24 hours 01/09/16 0807 01/11/16 1243   01/10/16 0730  vancomycin (VANCOCIN) 1,250 mg in sodium chloride 0.9 % 250 mL IVPB  Status:  Discontinued     1,250 mg 166.7 mL/hr over 90 Minutes Intravenous Every 24 hours 01/09/16 0807 01/11/16 1242   01/09/16 0730  vancomycin (VANCOCIN) 1,750 mg in sodium chloride 0.9 % 500 mL IVPB  Status:  Discontinued     1,750 mg 250 mL/hr over 120 Minutes Intravenous  Once 01/09/16 0724 01/09/16 0725   01/09/16 0730  vancomycin (VANCOCIN) 1,500 mg in sodium chloride 0.9 % 500 mL IVPB     1,500 mg 250 mL/hr over 120 Minutes Intravenous  Once 01/09/16 0725 01/09/16 0950   01/09/16 0715  ceFEPIme (MAXIPIME) 2 g in dextrose 5 % 50 mL IVPB     2 g 100 mL/hr over 30 Minutes Intravenous  Once 01/09/16 0703  01/09/16 0809   01/09/16 0715  vancomycin (VANCOCIN) IVPB 1000 mg/200 mL premix  Status:  Discontinued     1,000 mg 200 mL/hr over 60 Minutes Intravenous  Once 01/09/16 0703 01/09/16 0724         HPI/Subjective: afebrile , cough better , bp stable   Objective: Filed Vitals:   01/10/16 2323 01/11/16 0543 01/11/16 0723 01/11/16 1025  BP: 125/54 154/76  152/82  Pulse: 95 98  81  Temp: 97.7 F (36.5 C) 99 F (37.2 C)    TempSrc: Oral Oral    Resp: 26 22    Height: 6' (1.829 m)     Weight: 84.2 kg (185 lb 10 oz) 83.915 kg (185 lb)    SpO2: 97% 100% 99%     Intake/Output Summary (Last 24 hours) at 01/11/16 1353 Last data filed at 01/11/16 1100  Gross per 24 hour  Intake 1524.16 ml  Output    950 ml  Net 574.16 ml    Exam:  General: No acute respiratory distress Lungs: Clear to  auscultation bilaterally , some  wheezes , no crackles Cardiovascular: Regular rate and rhythm without murmur gallop or rub normal S1 and S2 Abdomen: Nontender, nondistended, soft, bowel sounds positive, no rebound, no ascites, no appreciable mass Extremities: No significant cyanosis, clubbing, or edema bilateral lower extremities     Data Review   Micro Results Recent Results (from the past 240 hour(s))  Culture, blood (routine x 2)     Status: None (Preliminary result)   Collection Time: 01/09/16  7:05 AM  Result Value Ref Range Status   Specimen Description BLOOD RIGHT ANTECUBITAL  Final   Special Requests BOTTLES DRAWN AEROBIC AND ANAEROBIC 5MLS  Final   Culture  Setup Time   Final    GRAM POSITIVE COCCI IN CLUSTERS CRITICAL RESULT CALLED TO, READ BACK BY AND VERIFIED WITH: J. MILLER AT 0236 ON [id number] BY Craig Guess    Culture STAPHYLOCOCCUS AUREUS  Final   Report Status PENDING  Incomplete  Culture, blood (routine x 2)     Status: None (Preliminary result)   Collection Time: 01/09/16  7:20 AM  Result Value Ref Range Status   Specimen Description BLOOD LEFT ANTECUBITAL  Final    Special Requests BOTTLES DRAWN AEROBIC AND ANAEROBIC 10MLS  Final   Culture  Setup Time   Final    GRAM POSITIVE COCCI IN CLUSTERS IN BOTH AEROBIC AND ANAEROBIC BOTTLES CRITICAL RESULT CALLED TO, READ BACK BY AND VERIFIED WITH: J MILLER @0236  01/10/16 MKELLY    Culture STAPHYLOCOCCUS AUREUS  Final   Report Status PENDING  Incomplete  Respiratory virus panel     Status: None   Collection Time: 01/09/16  9:20 AM  Result Value Ref Range Status   Source - RVPAN NASAL SWAB  Corrected   Respiratory Syncytial Virus A Negative Negative Final   Respiratory Syncytial Virus B Negative Negative Final   Influenza A Negative Negative Final   Influenza B Negative Negative Final   Parainfluenza 1 Negative Negative Final   Parainfluenza 2 Negative Negative Final   Parainfluenza 3 Negative Negative Final   Metapneumovirus Negative Negative Final   Rhinovirus Negative Negative Final   Adenovirus Negative Negative Final    Comment: (NOTE) faxed results 01/17/187 @ 1926 lr Performed At: [facility] [address] [place], [place] [id number] Lindon Romp MD [id number]   Urine culture     Status: None   Collection Time: 01/09/16 11:46 AM  Result Value Ref Range Status   Specimen Description URINE, CATHETERIZED  Final   Special Requests NONE  Final   Culture NO GROWTH 1 DAY  Final   Report Status 01/10/2016 FINAL  Final  MRSA PCR Screening     Status: Abnormal   Collection Time: 01/09/16  3:01 PM  Result Value Ref Range Status   MRSA by PCR POSITIVE (A) NEGATIVE Final    Comment:        The GeneXpert MRSA Assay (FDA approved for NASAL specimens only), is one component of a comprehensive MRSA colonization surveillance program. It is not intended to diagnose MRSA infection nor to guide or monitor treatment for MRSA infections. RESULT CALLED TO, READ BACK BY AND VERIFIED WITH: Jeannene Patella RN 17:15 01/09/16 ([email or web address])     Radiology Reports Dg Chest 2 View  01/09/2016  CLINICAL  DATA:  Cough and shortness of breath for a week. EXAM: CHEST  2 VIEW COMPARISON:  January 04, 2016. FINDINGS: Stable cardiomegaly. Status post coronary artery bypass graft. No pneumothorax or significant pleural effusion is  noted. There is interval development of right middle and lower lobe airspace opacities concerning for pneumonia. Left lung is clear. Bony thorax is unremarkable. IMPRESSION: Findings consistent with right middle and lower lobe pneumonia. Continued radiographic follow-up is recommended to ensure resolution. Electronically Signed   By: Marijo Conception, M.D.   On: 01/09/2016 08:08   Dg Chest 2 View  01/04/2016  CLINICAL DATA:  Wheezing, cough, congestion for 1 week EXAM: CHEST  2 VIEW COMPARISON:  12/13/2015 FINDINGS: Cardiomediastinal silhouette is stable. Central mild vascular congestion without convincing pulmonary edema. Status post CABG. Mild infrahilar bronchitic changes. No segmental infiltrate. Mild hyperinflation. IMPRESSION: Central mild vascular congestion without convincing pulmonary edema. Mild infrahilar bronchitic changes. Status post CABG. Mild hyperinflation. Electronically Signed   By: Lahoma Crocker M.D.   On: 01/04/2016 15:52   Ct Head Wo Contrast  12/13/2015  CLINICAL DATA:  Recent fall EXAM: CT HEAD WITHOUT CONTRAST TECHNIQUE: Contiguous axial images were obtained from the base of the skull through the vertex without intravenous contrast. COMPARISON:  None. FINDINGS: Bony calvarium is intact. Mild atrophic changes are noted commenced with the patient's given age. No findings to suggest acute hemorrhage, acute infarction or space-occupying mass lesion are identified. IMPRESSION: No acute abnormality noted. Electronically Signed   By: Inez Catalina M.D.   On: 12/13/2015 08:20   Mr Jodene Nam Head Wo Contrast  12/13/2015  CLINICAL DATA:  Cardiac catheterization yesterday. Awoke with right-sided arm and leg weakness. EXAM: MRI HEAD WITHOUT AND WITH CONTRAST MRA HEAD WITHOUT CONTRAST  MRA NECK WITHOUT AND WITH CONTRAST TECHNIQUE: Multiplanar, multiecho pulse sequences of the brain and surrounding structures were obtained without and with intravenous contrast. Angiographic images of the Circle of Willis were obtained using MRA technique without intravenous contrast. Angiographic images of the neck were obtained using MRA technique without and with intravenous contrast. Carotid stenosis measurements (when applicable) are obtained utilizing NASCET criteria, using the distal internal carotid diameter as the denominator. CONTRAST:  66mL MULTIHANCE GADOBENATE DIMEGLUMINE 529 MG/ML IV SOLN COMPARISON:  CT same day FINDINGS: MRI HEAD FINDINGS Diffusion imaging shows acute infarction within the ventral surface of the left medulla. No other acute infarction. Elsewhere the brainstem and cerebellum are normal. The cerebral hemispheres show mild chronic small-vessel ischemic changes of the white matter, fairly typical for age. Small focus of hemosiderin deposition in the right parietal deep white matter. No cortical or large vessel territory infarction. No mass lesion, acute hemorrhage, hydrocephalus or extra-axial collection. No pituitary mass. No inflammatory sinus disease. Small mastoid effusions bilaterally. After contrast administration, no abnormal enhancement of the brain occurs. MRA HEAD FINDINGS Both internal carotid arteries are widely patent into the brain. The anterior and middle cerebral vessels are patent without proximal stenosis, aneurysm or vascular malformation. Both vertebral arteries are patent with the right being dominant. No basilar stenosis. Anterior inferior cerebellar arteries, superior cerebellar arteries and posterior cerebral arteries appear patent and normal. Posterior inferior cerebellar arteries not demonstrated, possibly congenitally hypoplastic or aplastic. MRA NECK FINDINGS Branching pattern of the brachiocephalic vessels from the arch is normal. No origin stenoses. Both  common carotid arteries widely patent to their respective bifurcation. Mild atherosclerotic disease at both carotid bifurcations but no stenosis of the internal carotid artery on either side. 50% stenosis of the external carotid artery on the right. Vertebral artery origins are not well seen because of physiologic motion in the chest. I think the left vertebral artery arises from the arch. In the neck, both vertebral arteries are  widely patent without focal stenosis. IMPRESSION: Acute infarction along the ventral surface of the left medulla. No hemorrhage or swelling. Mild chronic small-vessel ischemic changes elsewhere affecting the brain, fairly typical for age. No carotid bifurcation stenosis other than 50% narrowing of the external carotid artery on the right. Vertebral artery origins can't be accurately evaluated because of physiologic motion. Small left vertebral artery arises directly from the arch. Vertebral arteries both widely patent in the neck to the basilar. Electronically Signed   By: Nelson Chimes M.D.   On: 12/13/2015 15:54   Mr Angiogram Neck W Wo Contrast  12/13/2015  CLINICAL DATA:  Cardiac catheterization yesterday. Awoke with right-sided arm and leg weakness. EXAM: MRI HEAD WITHOUT AND WITH CONTRAST MRA HEAD WITHOUT CONTRAST MRA NECK WITHOUT AND WITH CONTRAST TECHNIQUE: Multiplanar, multiecho pulse sequences of the brain and surrounding structures were obtained without and with intravenous contrast. Angiographic images of the Circle of Willis were obtained using MRA technique without intravenous contrast. Angiographic images of the neck were obtained using MRA technique without and with intravenous contrast. Carotid stenosis measurements (when applicable) are obtained utilizing NASCET criteria, using the distal internal carotid diameter as the denominator. CONTRAST:  8mL MULTIHANCE GADOBENATE DIMEGLUMINE 529 MG/ML IV SOLN COMPARISON:  CT same day FINDINGS: MRI HEAD FINDINGS Diffusion imaging  shows acute infarction within the ventral surface of the left medulla. No other acute infarction. Elsewhere the brainstem and cerebellum are normal. The cerebral hemispheres show mild chronic small-vessel ischemic changes of the white matter, fairly typical for age. Small focus of hemosiderin deposition in the right parietal deep white matter. No cortical or large vessel territory infarction. No mass lesion, acute hemorrhage, hydrocephalus or extra-axial collection. No pituitary mass. No inflammatory sinus disease. Small mastoid effusions bilaterally. After contrast administration, no abnormal enhancement of the brain occurs. MRA HEAD FINDINGS Both internal carotid arteries are widely patent into the brain. The anterior and middle cerebral vessels are patent without proximal stenosis, aneurysm or vascular malformation. Both vertebral arteries are patent with the right being dominant. No basilar stenosis. Anterior inferior cerebellar arteries, superior cerebellar arteries and posterior cerebral arteries appear patent and normal. Posterior inferior cerebellar arteries not demonstrated, possibly congenitally hypoplastic or aplastic. MRA NECK FINDINGS Branching pattern of the brachiocephalic vessels from the arch is normal. No origin stenoses. Both common carotid arteries widely patent to their respective bifurcation. Mild atherosclerotic disease at both carotid bifurcations but no stenosis of the internal carotid artery on either side. 50% stenosis of the external carotid artery on the right. Vertebral artery origins are not well seen because of physiologic motion in the chest. I think the left vertebral artery arises from the arch. In the neck, both vertebral arteries are widely patent without focal stenosis. IMPRESSION: Acute infarction along the ventral surface of the left medulla. No hemorrhage or swelling. Mild chronic small-vessel ischemic changes elsewhere affecting the brain, fairly typical for age. No carotid  bifurcation stenosis other than 50% narrowing of the external carotid artery on the right. Vertebral artery origins can't be accurately evaluated because of physiologic motion. Small left vertebral artery arises directly from the arch. Vertebral arteries both widely patent in the neck to the basilar. Electronically Signed   By: Nelson Chimes M.D.   On: 12/13/2015 15:54   Mr Jeri Cos [id number] Contrast  12/13/2015  CLINICAL DATA:  Cardiac catheterization yesterday. Awoke with right-sided arm and leg weakness. EXAM: MRI HEAD WITHOUT AND WITH CONTRAST MRA HEAD WITHOUT CONTRAST MRA NECK WITHOUT AND WITH CONTRAST  TECHNIQUE: Multiplanar, multiecho pulse sequences of the brain and surrounding structures were obtained without and with intravenous contrast. Angiographic images of the Circle of Willis were obtained using MRA technique without intravenous contrast. Angiographic images of the neck were obtained using MRA technique without and with intravenous contrast. Carotid stenosis measurements (when applicable) are obtained utilizing NASCET criteria, using the distal internal carotid diameter as the denominator. CONTRAST:  67mL MULTIHANCE GADOBENATE DIMEGLUMINE 529 MG/ML IV SOLN COMPARISON:  CT same day FINDINGS: MRI HEAD FINDINGS Diffusion imaging shows acute infarction within the ventral surface of the left medulla. No other acute infarction. Elsewhere the brainstem and cerebellum are normal. The cerebral hemispheres show mild chronic small-vessel ischemic changes of the white matter, fairly typical for age. Small focus of hemosiderin deposition in the right parietal deep white matter. No cortical or large vessel territory infarction. No mass lesion, acute hemorrhage, hydrocephalus or extra-axial collection. No pituitary mass. No inflammatory sinus disease. Small mastoid effusions bilaterally. After contrast administration, no abnormal enhancement of the brain occurs. MRA HEAD FINDINGS Both internal carotid arteries are  widely patent into the brain. The anterior and middle cerebral vessels are patent without proximal stenosis, aneurysm or vascular malformation. Both vertebral arteries are patent with the right being dominant. No basilar stenosis. Anterior inferior cerebellar arteries, superior cerebellar arteries and posterior cerebral arteries appear patent and normal. Posterior inferior cerebellar arteries not demonstrated, possibly congenitally hypoplastic or aplastic. MRA NECK FINDINGS Branching pattern of the brachiocephalic vessels from the arch is normal. No origin stenoses. Both common carotid arteries widely patent to their respective bifurcation. Mild atherosclerotic disease at both carotid bifurcations but no stenosis of the internal carotid artery on either side. 50% stenosis of the external carotid artery on the right. Vertebral artery origins are not well seen because of physiologic motion in the chest. I think the left vertebral artery arises from the arch. In the neck, both vertebral arteries are widely patent without focal stenosis. IMPRESSION: Acute infarction along the ventral surface of the left medulla. No hemorrhage or swelling. Mild chronic small-vessel ischemic changes elsewhere affecting the brain, fairly typical for age. No carotid bifurcation stenosis other than 50% narrowing of the external carotid artery on the right. Vertebral artery origins can't be accurately evaluated because of physiologic motion. Small left vertebral artery arises directly from the arch. Vertebral arteries both widely patent in the neck to the basilar. Electronically Signed   By: Nelson Chimes M.D.   On: 12/13/2015 15:54   Dg Chest Port 1 View  12/13/2015  CLINICAL DATA:  Right leg weakness today. Cardiac catheterization yesterday. EXAM: PORTABLE CHEST 1 VIEW COMPARISON:  Chest CT 10/24/2014.  Radiographs 12/11/2015. FINDINGS: Stable cardiomegaly status post CABG. There is mildly increased vascular congestion without overt  pulmonary edema. There is no confluent airspace opacity, pneumothorax or significant pleural effusion. The bones appear unchanged. IMPRESSION: Mildly increased vascular congestion without overt pulmonary edema or confluent airspace opacity. Stable cardiomegaly. Electronically Signed   By: Richardean Sale M.D.   On: 12/13/2015 07:59   Dg Swallowing Func-speech Pathology  01/10/2016  Objective Swallowing Evaluation:   Patient Details Name: Audie Shakespear MRN: [id number] Date of Birth: September 19, 1928 Today's Date: 01/10/2016 Time: SLP Start Time (ACUTE ONLY): 0930-SLP Stop Time (ACUTE ONLY): 0945 SLP Time Calculation (min) (ACUTE ONLY): 15 min Past Medical History: Past Medical History Diagnosis Date . Hypertension  . High cholesterol  . Prostate cancer ([place])  . CAD (coronary artery disease)  . OSA (obstructive sleep apnea)  . COPD (  chronic obstructive pulmonary disease) (HCC)    MODERATE . Diabetes mellitus ([place])  . GERD (gastroesophageal reflux disease)  . Vertigo  . Arthritis    OA . Anemia  . CHF (congestive heart failure) (HCC)    grade 2 diastolic dysfuction . CKD (chronic kidney disease)  . Stroke (cerebrum) ([place])    11/2015 . HCAP (healthcare-associated pneumonia)    12/2015 Past Surgical History: Past Surgical History Procedure Laterality Date . Coronary artery bypass graft  03/29/1999   x 4:  LIMA to the LAD,SVG to the diagonal branch of the LAD,SVG to the intermediate coronary artery & SVG to the posterior descending branch of the RCA. . Colon surgery     x 4 . Nm myocar perf wall motion  04/28/2012   Low Risk Scan . Prostate surgery   . Cardiac catheterization   . Cardiac catheterization N/A 12/12/2015   Procedure: Right/Left Heart Cath and Coronary/Graft Angiography;  Surgeon: Troy Sine, MD;  Location: [facility];  Service: Cardiovascular;  Laterality: N/A; . Peripheral vascular catheterization N/A 12/12/2015   Procedure: Aortic Arch Angiography;  Surgeon: Troy Sine, MD;  Location: [facility];  Service: Cardiovascular;  Laterality: N/A; . Eye surgery   HPI: 82 y.o. 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 He is admitted for sepsis secondary to PNA (RML and RLL per imaging report). Subjective: pt alert, denies any difficulties with swallowing Assessment / Plan / Recommendation CHL IP CLINICAL IMPRESSIONS 01/10/2016 Therapy Diagnosis Mild oral phase dysphagia;Suspected primary esophageal dysphagia Clinical Impression Pt demonstrates no significant oropharyngeal dysphagia worrisome for aspiration risk. There is slow bolus formation with occasional premature spillage to valleculae. Initial sips of nectar showed a delay in swallow initiation, but as study progressed pt was taking consecutive sips of thin liquids with timely swallow. Strength WNL, no penetration or aspiration occurred despite attempt to tax system. Pt did have some delayed coughing; esophageal sweep showed appearence of significant stasis with purees which did not clear with sips of liquid. Pill transited with multiple bites and sips. Discussed esophageal precautions. Recommend regular diet and thin liquids. SLP will f/u for education and tolerance at bedside.  Impact on safety and function Moderate aspiration risk   CHL IP TREATMENT RECOMMENDATION 01/10/2016 Treatment Recommendations Therapy as outlined in treatment plan below   No flowsheet data found. CHL IP DIET RECOMMENDATION 01/10/2016 SLP Diet Recommendations Regular solids;Thin liquid Liquid Administration via Cup;Straw Medication Administration Whole meds with puree Compensations Slow rate;Small sips/bites;Follow solids with liquid Postural Changes Remain semi-upright after after feeds/meals (Comment);Seated upright at 90 degrees   CHL IP OTHER RECOMMENDATIONS 01/10/2016 Recommended Consults -- Oral Care Recommendations Patient independent with oral care Other Recommendations --   CHL IP FOLLOW UP RECOMMENDATIONS  01/10/2016 Follow up Recommendations None   CHL IP FREQUENCY AND DURATION 01/10/2016 Speech Therapy Frequency (ACUTE ONLY) min 1 x/week Treatment Duration 1 week      CHL IP ORAL PHASE 01/10/2016 Oral Phase Impaired Oral - Pudding Teaspoon -- Oral - Pudding Cup -- Oral - Honey Teaspoon -- Oral - Honey Cup -- Oral - Nectar Teaspoon -- Oral - Nectar Cup Delayed oral transit;Premature spillage Oral - Nectar Straw Delayed oral transit;Premature spillage Oral - Thin Teaspoon -- Oral - Thin Cup Premature spillage Oral - Thin Straw Premature spillage Oral - Puree WFL Oral - Mech Soft -- Oral - Regular WFL Oral - Multi-Consistency -- Oral -  Pill -- Oral Phase - Comment --  CHL IP PHARYNGEAL PHASE 01/10/2016 Pharyngeal Phase Impaired Pharyngeal- Pudding Teaspoon -- Pharyngeal -- Pharyngeal- Pudding Cup -- Pharyngeal -- Pharyngeal- Honey Teaspoon -- Pharyngeal -- Pharyngeal- Honey Cup -- Pharyngeal -- Pharyngeal- Nectar Teaspoon -- Pharyngeal -- Pharyngeal- Nectar Cup Delayed swallow initiation-vallecula Pharyngeal -- Pharyngeal- Nectar Straw Delayed swallow initiation-pyriform sinuses Pharyngeal -- Pharyngeal- Thin Teaspoon -- Pharyngeal -- Pharyngeal- Thin Cup WFL Pharyngeal -- Pharyngeal- Thin Straw WFL Pharyngeal -- Pharyngeal- Puree WFL Pharyngeal -- Pharyngeal- Mechanical Soft -- Pharyngeal -- Pharyngeal- Regular WFL Pharyngeal -- Pharyngeal- Multi-consistency -- Pharyngeal -- Pharyngeal- Pill WFL Pharyngeal -- Pharyngeal Comment --  CHL IP CERVICAL ESOPHAGEAL PHASE 01/10/2016 Cervical Esophageal Phase Impaired Pudding Teaspoon -- Pudding Cup -- Honey Teaspoon -- Honey Cup -- Nectar Teaspoon -- Nectar Cup -- Nectar Straw -- Thin Teaspoon -- Thin Cup -- Thin Straw -- Puree -- Mechanical Soft -- Regular -- Multi-consistency -- Pill -- Cervical Esophageal Comment -- CHL IP GO 12/14/2015 Functional Assessment Tool Used skilled clinical judgement Functional Limitations Motor speech Swallow Current Status [phone]) (None) Swallow  Goal Status [id number]) (None) Swallow Discharge Status [id number]) (None) Motor Speech Current Status [id number]) [place] Motor Speech Goal Status UK:060616) [place] Motor Speech Goal Status SA:931536) [place] Spoken Language Comprehension Current Status MZ:5018135) (None) Spoken Language Comprehension Goal Status YD:[id number]) (None) Spoken Language Comprehension Discharge Status [id number]) (None) Spoken Language Expression Current Status FP:837989) (None) Spoken Language Expression Goal Status LT:9098795) (None) Spoken Language Expression Discharge Status [phone]) (None) Attention Current Status OM:1732502) (None) Attention Goal Status EY:7266000) (None) Attention Discharge Status PJ:4613913) (None) Memory Current Status YL:3545582) (None) Memory Goal Status CF:3682075) (None) Memory Discharge Status QC:115444) (None) Voice Current Status BV:6183357) (None) Voice Goal Status EW:8517110) (None) Voice Discharge Status JH:9561856) (None) Other Speech-Language Pathology Functional Limitation UC:978821) (None) Other Speech-Language Pathology Functional Limitation Goal Status XD:1448828) (None) Other Speech-Language Pathology Functional Limitation Discharge Status [phone]) (None) Herbie Baltimore, MA CCC-SLP [phone] Lynann Beaver 01/10/2016, 10:15 AM                CBC  Recent Labs Lab 01/09/16 0704 01/10/16 0127 01/11/16 0604  WBC 7.2 9.9 9.3  HGB 10.3* 8.2* 8.5*  HCT 31.9* 25.7* 26.3*  PLT 271 262 282  MCV 85.1 85.1 86.5  MCH 27.5 27.2 28.0  MCHC 32.3 31.9 32.3  RDW 17.1* 17.0* 17.2*  LYMPHSABS 0.6*  --   --   MONOABS 0.3  --   --   EOSABS 0.0  --   --   BASOSABS 0.0  --   --     Chemistries   Recent Labs Lab 01/09/16 0704 01/10/16 0127 01/11/16 0604  NA 133* 137 140  K 4.4 4.0 3.7  CL 103 107 110  CO2 20* 20* 24  GLUCOSE 325* 351* 184*  BUN 21* 23* 20  CREATININE 1.31* 1.20 1.13  CALCIUM 9.4 9.2 9.1  AST 21 26 21   ALT 20 18 17   ALKPHOS 73 60 62  BILITOT 1.0 0.6 0.7    ------------------------------------------------------------------------------------------------------------------ estimated creatinine clearance is 52.5 mL/min (by C-G formula based on Cr of 1.13). ------------------------------------------------------------------------------------------------------------------ No results for input(s): HGBA1C in the last 72 hours. ------------------------------------------------------------------------------------------------------------------ No results for input(s): CHOL, HDL, LDLCALC, TRIG, CHOLHDL, LDLDIRECT in the last 72 hours. ------------------------------------------------------------------------------------------------------------------ No results for input(s): TSH, T4TOTAL, T3FREE, THYROIDAB in the last 72 hours.  Invalid input(s): FREET3 ------------------------------------------------------------------------------------------------------------------ No results for input(s): VITAMINB12, FOLATE, FERRITIN, TIBC, IRON, RETICCTPCT in the last 72 hours.  Coagulation profile  Recent Labs  Lab 01/09/16 1926  INR 1.48    No results for input(s): DDIMER in the last 72 hours.  Cardiac Enzymes  Recent Labs Lab 01/10/16 0127 01/10/16 0755 01/10/16 1412  TROPONINI 2.98* 2.21* 1.64*   ------------------------------------------------------------------------------------------------------------------ Invalid input(s): POCBNP   CBG:  Recent Labs Lab 01/10/16 1526 01/10/16 2127 01/11/16 0012 01/11/16 0805 01/11/16 1217  GLUCAP 263* 207* 212* 173* 193*       Studies: Dg Swallowing Func-speech Pathology  01/10/2016  Objective Swallowing Evaluation:   Patient Details Name: Damione Posten MRN: [id number] Date of Birth: 13-Jun-1928 Today's Date: 01/10/2016 Time: SLP Start Time (ACUTE ONLY): 0930-SLP Stop Time (ACUTE ONLY): 0945 SLP Time Calculation (min) (ACUTE ONLY): 15 min Past Medical History: Past Medical History Diagnosis Date .  Hypertension  . High cholesterol  . Prostate cancer ([place])  . CAD (coronary artery disease)  . OSA (obstructive sleep apnea)  . COPD (chronic obstructive pulmonary disease) (HCC)    MODERATE . Diabetes mellitus ([place])  . GERD (gastroesophageal reflux disease)  . Vertigo  . Arthritis    OA . Anemia  . CHF (congestive heart failure) (HCC)    grade 2 diastolic dysfuction . CKD (chronic kidney disease)  . Stroke (cerebrum) ([place])    11/2015 . HCAP (healthcare-associated pneumonia)    12/2015 Past Surgical History: Past Surgical History Procedure Laterality Date . Coronary artery bypass graft  03/29/1999   x 4:  LIMA to the LAD,SVG to the diagonal branch of the LAD,SVG to the intermediate coronary artery & SVG to the posterior descending branch of the RCA. . Colon surgery     x 4 . Nm myocar perf wall motion  04/28/2012   Low Risk Scan . Prostate surgery   . Cardiac catheterization   . Cardiac catheterization N/A 12/12/2015   Procedure: Right/Left Heart Cath and Coronary/Graft Angiography;  Surgeon: Troy Sine, MD;  Location: [facility];  Service: Cardiovascular;  Laterality: N/A; . Peripheral vascular catheterization N/A 12/12/2015   Procedure: Aortic Arch Angiography;  Surgeon: Troy Sine, MD;  Location: [facility];  Service: Cardiovascular;  Laterality: N/A; . Eye surgery   HPI: 82 y.o. 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 He is admitted for sepsis secondary to PNA (RML and RLL per imaging report). Subjective: pt alert, denies any difficulties with swallowing Assessment / Plan / Recommendation CHL IP CLINICAL IMPRESSIONS 01/10/2016 Therapy Diagnosis Mild oral phase dysphagia;Suspected primary esophageal dysphagia Clinical Impression Pt demonstrates no significant oropharyngeal dysphagia worrisome for aspiration risk. There is slow bolus formation with occasional premature spillage to valleculae. Initial sips of nectar showed a  delay in swallow initiation, but as study progressed pt was taking consecutive sips of thin liquids with timely swallow. Strength WNL, no penetration or aspiration occurred despite attempt to tax system. Pt did have some delayed coughing; esophageal sweep showed appearence of significant stasis with purees which did not clear with sips of liquid. Pill transited with multiple bites and sips. Discussed esophageal precautions. Recommend regular diet and thin liquids. SLP will f/u for education and tolerance at bedside.  Impact on safety and function Moderate aspiration risk   CHL IP TREATMENT RECOMMENDATION 01/10/2016 Treatment Recommendations Therapy as outlined in treatment plan below   No flowsheet data found. CHL IP DIET RECOMMENDATION 01/10/2016 SLP Diet Recommendations Regular solids;Thin liquid Liquid Administration via Cup;Straw Medication Administration Whole meds with puree Compensations Slow rate;Small sips/bites;Follow solids with liquid Postural  Changes Remain semi-upright after after feeds/meals (Comment);Seated upright at 90 degrees   CHL IP OTHER RECOMMENDATIONS 01/10/2016 Recommended Consults -- Oral Care Recommendations Patient independent with oral care Other Recommendations --   CHL IP FOLLOW UP RECOMMENDATIONS 01/10/2016 Follow up Recommendations None   CHL IP FREQUENCY AND DURATION 01/10/2016 Speech Therapy Frequency (ACUTE ONLY) min 1 x/week Treatment Duration 1 week      CHL IP ORAL PHASE 01/10/2016 Oral Phase Impaired Oral - Pudding Teaspoon -- Oral - Pudding Cup -- Oral - Honey Teaspoon -- Oral - Honey Cup -- Oral - Nectar Teaspoon -- Oral - Nectar Cup Delayed oral transit;Premature spillage Oral - Nectar Straw Delayed oral transit;Premature spillage Oral - Thin Teaspoon -- Oral - Thin Cup Premature spillage Oral - Thin Straw Premature spillage Oral - Puree WFL Oral - Mech Soft -- Oral - Regular WFL Oral - Multi-Consistency -- Oral - Pill -- Oral Phase - Comment --  CHL IP PHARYNGEAL PHASE 01/10/2016  Pharyngeal Phase Impaired Pharyngeal- Pudding Teaspoon -- Pharyngeal -- Pharyngeal- Pudding Cup -- Pharyngeal -- Pharyngeal- Honey Teaspoon -- Pharyngeal -- Pharyngeal- Honey Cup -- Pharyngeal -- Pharyngeal- Nectar Teaspoon -- Pharyngeal -- Pharyngeal- Nectar Cup Delayed swallow initiation-vallecula Pharyngeal -- Pharyngeal- Nectar Straw Delayed swallow initiation-pyriform sinuses Pharyngeal -- Pharyngeal- Thin Teaspoon -- Pharyngeal -- Pharyngeal- Thin Cup WFL Pharyngeal -- Pharyngeal- Thin Straw WFL Pharyngeal -- Pharyngeal- Puree WFL Pharyngeal -- Pharyngeal- Mechanical Soft -- Pharyngeal -- Pharyngeal- Regular WFL Pharyngeal -- Pharyngeal- Multi-consistency -- Pharyngeal -- Pharyngeal- Pill WFL Pharyngeal -- Pharyngeal Comment --  CHL IP CERVICAL ESOPHAGEAL PHASE 01/10/2016 Cervical Esophageal Phase Impaired Pudding Teaspoon -- Pudding Cup -- Honey Teaspoon -- Honey Cup -- Nectar Teaspoon -- Nectar Cup -- Nectar Straw -- Thin Teaspoon -- Thin Cup -- Thin Straw -- Puree -- Mechanical Soft -- Regular -- Multi-consistency -- Pill -- Cervical Esophageal Comment -- CHL IP GO 12/14/2015 Functional Assessment Tool Used skilled clinical judgement Functional Limitations Motor speech Swallow Current Status [phone]) (None) Swallow Goal Status MB:535449) (None) Swallow Discharge Status HL:7548781) (None) Motor Speech Current Status LZ:4190269) [place] Motor Speech Goal Status BA:6384036) [place] Motor Speech Goal Status SG:4719142) [place] Spoken Language Comprehension Current Status XK:431433) (None) Spoken Language Comprehension Goal Status JI:2804292) (None) Spoken Language Comprehension Discharge Status IA:8133106) (None) Spoken Language Expression Current Status PD:6807704) (None) Spoken Language Expression Goal Status XP:9498270) (None) Spoken Language Expression Discharge Status FB:275424) (None) Attention Current Status LV:671222) (None) Attention Goal Status FV:388293) (None) Attention Discharge Status VJ:2303441) (None) Memory Current Status AE:130515) (None) Memory Goal Status  GI:463060) (None) Memory Discharge Status UZ:5226335) (None) Voice Current Status PO:3169984) (None) Voice Goal Status SQ:4094147) (None) Voice Discharge Status DH:2984163) (None) Other Speech-Language Pathology Functional Limitation OZ:4168641) (None) Other Speech-Language Pathology Functional Limitation Goal Status RK:3086896) (None) Other Speech-Language Pathology Functional Limitation Discharge Status [phone]) (None) Herbie Baltimore, MA CCC-SLP [phone] Lynann Beaver 01/10/2016, 10:15 AM                 Lab Results  Component Value Date   HGBA1C 6.8* 12/14/2015   Lab Results  Component Value Date   LDLCALC 53 12/14/2015   CREATININE 1.13 01/11/2016       Scheduled Meds: . aspirin  81 mg Oral Daily  . benzonatate  100 mg Oral TID  . ceFEPime (MAXIPIME) IV  2 g Intravenous Q12H  . Chlorhexidine Gluconate Cloth  6 each Topical Q0600  . clopidogrel  75 mg Oral Daily  . cycloSPORINE  1 drop Both Eyes BID  .  feeding supplement (ENSURE ENLIVE)  237 mL Oral BID BM  . [START ON 01/12/2016] furosemide  40 mg Oral Daily  . insulin aspart  0-15 Units Subcutaneous TID WC  . insulin aspart  0-5 Units Subcutaneous QHS  . insulin glargine  15 Units Subcutaneous Daily  . ipratropium-albuterol  3 mL Nebulization TID  . ketotifen  1 drop Both Eyes BID  . latanoprost  1 drop Both Eyes QHS  . metoprolol succinate  50 mg Oral Daily  . mupirocin ointment  1 application Nasal BID  . oxybutynin  5 mg Oral Daily  . pantoprazole  80 mg Oral Q1200  . rosuvastatin  20 mg Oral q1800  . vancomycin  750 mg Intravenous Q12H   Continuous Infusions: . sodium chloride 50 mL/hr at 01/11/16 0401    Principal Problem:   Sepsis ([place]) Active Problems:   CAD (coronary artery disease) of artery bypass graft   Essential hypertension   DM2 (diabetes mellitus, type 2) (HCC)   Aortic valve stenosis   COPD (chronic obstructive pulmonary disease) (HCC)   Stroke, acute, thrombotic ([place])   Acute respiratory failure ([place])   HCAP  (healthcare-associated pneumonia)   CKD (chronic kidney disease)    Time spent: 45 minutes   [facility] Pager [phone]. If 7PM-7AM, please contact night-coverage at [email or web address], password [facility] 01/11/2016, 1:53 PM  LOS: 2 days

## 2016-01-11 NOTE — Consult Note (Signed)
[facility] for Infectious Disease       Reason for Consult: Staph aureus bacteremia    Referring Physician: Dr. Delena Serve  Principal Problem:   Sepsis ([place]) Active Problems:   CAD (coronary artery disease) of artery bypass graft   Essential hypertension   DM2 (diabetes mellitus, type 2) (HCC)   Aortic valve stenosis   COPD (chronic obstructive pulmonary disease) (HCC)   Stroke, acute, thrombotic ([place])   Acute respiratory failure ([place])   HCAP (healthcare-associated pneumonia)   CKD (chronic kidney disease)   . aspirin  81 mg Oral Daily  . benzonatate  100 mg Oral TID  . ceFEPime (MAXIPIME) IV  2 g Intravenous Q12H  . Chlorhexidine Gluconate Cloth  6 each Topical Q0600  . clopidogrel  75 mg Oral Daily  . cycloSPORINE  1 drop Both Eyes BID  . feeding supplement (ENSURE ENLIVE)  237 mL Oral BID BM  . [START ON 01/12/2016] furosemide  40 mg Oral Daily  . insulin aspart  0-15 Units Subcutaneous TID WC  . insulin aspart  0-5 Units Subcutaneous QHS  . insulin glargine  15 Units Subcutaneous Daily  . ipratropium-albuterol  3 mL Nebulization TID  . ketotifen  1 drop Both Eyes BID  . latanoprost  1 drop Both Eyes QHS  . metoprolol succinate  50 mg Oral Daily  . mupirocin ointment  1 application Nasal BID  . oxybutynin  5 mg Oral Daily  . pantoprazole  80 mg Oral Q1200  . rosuvastatin  20 mg Oral q1800  . vancomycin  750 mg Intravenous Q12H    Recommendations: Vancomycin and cefazolin pending sensitivities Repeat blood cultures TEE   Assessment: He has Staph aureus bacteremia and multiple medical issues.  ? Aspiration pneumonitis.  Doubt it is Staph aureus pneumonia.    Recent CVA.   Antibiotics: Vancomycin and cefepime  HPI: Douglas Carrillo is a 82 y.o. male with HTN, DM, chronic respiratory failure on O2 at night who was hospitalized in December for arm and leg weakness and noted acute infarct in the left medulla.  He went to rehab and discharged 1/6.  He  returned 1/17 with cough, sob, fever.  CXR with worsening findings, opacity and he was admitted for pneumonia and started on vancomycin and cefepime.  Blood cultures now 2/2 with Staph aureus.  Feels better since admission.  REspiratory viral panel negative. Having swallowing difficulty and getting swallow evaluation.  CXR independently reviewed and increased opacities.   Review of Systems:  Constitutional: negative for fevers and chills Gastrointestinal: negative for nausea and diarrhea Musculoskeletal: negative for myalgias and arthralgias All other systems reviewed and are negative   Past Medical History  Diagnosis Date  . Hypertension   . High cholesterol   . Prostate cancer ([place])   . CAD (coronary artery disease)   . OSA (obstructive sleep apnea)   . COPD (chronic obstructive pulmonary disease) (HCC)     MODERATE  . Diabetes mellitus ([place])   . GERD (gastroesophageal reflux disease)   . Vertigo   . Arthritis     OA  . Anemia   . CHF (congestive heart failure) (HCC)     grade 2 diastolic dysfuction  . CKD (chronic kidney disease)   . Stroke (cerebrum) ([place])     11/2015  . HCAP (healthcare-associated pneumonia)     12/2015    Social History  Substance Use Topics  . Smoking status: Former Smoker -- 1.00 packs/day for 30 years  Types: Cigarettes    Quit date: 12/24/1983  . Smokeless tobacco: Never Used     Comment: quit smoking in 1985  . Alcohol Use: 0.0 oz/week    0 Standard drinks or equivalent per week     Comment: rarely    Family History  Problem Relation Age of Onset  . Kidney disease Brother   . Breast cancer Sister   . Rheum arthritis Mother     Allergies  Allergen Reactions  . Mold Extract [Trichophyton] Anaphylaxis  . Lipitor [Atorvastatin] Other (See Comments)    Weakness in legs/myalgias    Physical Exam: Constitutional: in no apparent distress and alert  Filed Vitals:   01/11/16 0543 01/11/16 1025  BP: 154/76 152/82  Pulse: 98 81  Temp:  99 F (37.2 C)   Resp: 22    EYES: anicteric ENMT: no thrush Cardiovascular: Cor RRR Respiratory: CTA B; normal respiratory effort GI: Bowel sounds are normal, liver is not enlarged, spleen is not enlarged Musculoskeletal: peripheral pulses normal, no pedal edema, no clubbing or cyanosis Skin: negatives: no rash Hematologic: no cervical lad  Lab Results  Component Value Date   WBC 9.3 01/11/2016   HGB 8.5* 01/11/2016   HCT 26.3* 01/11/2016   MCV 86.5 01/11/2016   PLT 282 01/11/2016    Lab Results  Component Value Date   CREATININE 1.13 01/11/2016   BUN 20 01/11/2016   NA 140 01/11/2016   K 3.7 01/11/2016   CL 110 01/11/2016   CO2 24 01/11/2016    Lab Results  Component Value Date   ALT 17 01/11/2016   AST 21 01/11/2016   ALKPHOS 62 01/11/2016     Microbiology: Recent Results (from the past 240 hour(s))  Culture, blood (routine x 2)     Status: None (Preliminary result)   Collection Time: 01/09/16  7:05 AM  Result Value Ref Range Status   Specimen Description BLOOD RIGHT ANTECUBITAL  Final   Special Requests BOTTLES DRAWN AEROBIC AND ANAEROBIC 5MLS  Final   Culture  Setup Time   Final    GRAM POSITIVE COCCI IN CLUSTERS CRITICAL RESULT CALLED TO, READ BACK BY AND VERIFIED WITH: J. MILLER AT 0236 ON [id number] BY Craig Guess    Culture STAPHYLOCOCCUS AUREUS  Final   Report Status PENDING  Incomplete  Culture, blood (routine x 2)     Status: None (Preliminary result)   Collection Time: 01/09/16  7:20 AM  Result Value Ref Range Status   Specimen Description BLOOD LEFT ANTECUBITAL  Final   Special Requests BOTTLES DRAWN AEROBIC AND ANAEROBIC 10MLS  Final   Culture  Setup Time   Final    GRAM POSITIVE COCCI IN CLUSTERS IN BOTH AEROBIC AND ANAEROBIC BOTTLES CRITICAL RESULT CALLED TO, READ BACK BY AND VERIFIED WITH: J MILLER @0236  01/10/16 MKELLY    Culture STAPHYLOCOCCUS AUREUS  Final   Report Status PENDING  Incomplete  Respiratory virus panel     Status: None    Collection Time: 01/09/16  9:20 AM  Result Value Ref Range Status   Source - RVPAN NASAL SWAB  Corrected   Respiratory Syncytial Virus A Negative Negative Final   Respiratory Syncytial Virus B Negative Negative Final   Influenza A Negative Negative Final   Influenza B Negative Negative Final   Parainfluenza 1 Negative Negative Final   Parainfluenza 2 Negative Negative Final   Parainfluenza 3 Negative Negative Final   Metapneumovirus Negative Negative Final   Rhinovirus Negative Negative Final   Adenovirus  Negative Negative Final    Comment: (NOTE) faxed results 01/17/187 @ 1926 lr Performed At: [facility] [address] [place], [place] [id number] Lindon Romp MD [id number]   Urine culture     Status: None   Collection Time: 01/09/16 11:46 AM  Result Value Ref Range Status   Specimen Description URINE, CATHETERIZED  Final   Special Requests NONE  Final   Culture NO GROWTH 1 DAY  Final   Report Status 01/10/2016 FINAL  Final  MRSA PCR Screening     Status: Abnormal   Collection Time: 01/09/16  3:01 PM  Result Value Ref Range Status   MRSA by PCR POSITIVE (A) NEGATIVE Final    Comment:        The GeneXpert MRSA Assay (FDA approved for NASAL specimens only), is one component of a comprehensive MRSA colonization surveillance program. It is not intended to diagnose MRSA infection nor to guide or monitor treatment for MRSA infections. RESULT CALLED TO, READ BACK BY AND VERIFIED WITH: Jeannene Patella RN 17:15 01/09/16 ([email or web address])     Scharlene Gloss, [place] [facility] Infectious [facility] [email or web address] [phone] pager  [phone] cell 01/11/2016, 2:11 PM

## 2016-01-12 ENCOUNTER — Encounter (HOSPITAL_COMMUNITY): Payer: Self-pay

## 2016-01-12 ENCOUNTER — Encounter (HOSPITAL_COMMUNITY)
Admission: EM | Disposition: A | Discharge status: Skilled Nursing Facility | Payer: Self-pay | Source: Home / Self Care | Attending: Internal Medicine

## 2016-01-12 ENCOUNTER — Ambulatory Visit (HOSPITAL_COMMUNITY): Payer: Medicare Other

## 2016-01-12 DIAGNOSIS — N189 Chronic kidney disease, unspecified: Secondary | ICD-10-CM

## 2016-01-12 DIAGNOSIS — A4902 Methicillin resistant Staphylococcus aureus infection, unspecified site: Secondary | ICD-10-CM

## 2016-01-12 DIAGNOSIS — G8191 Hemiplegia, unspecified affecting right dominant side: Secondary | ICD-10-CM

## 2016-01-12 DIAGNOSIS — R5381 Other malaise: Secondary | ICD-10-CM

## 2016-01-12 DIAGNOSIS — I351 Nonrheumatic aortic (valve) insufficiency: Secondary | ICD-10-CM

## 2016-01-12 DIAGNOSIS — A419 Sepsis, unspecified organism: Secondary | ICD-10-CM

## 2016-01-12 HISTORY — PX: TEE WITHOUT CARDIOVERSION: SHX5443

## 2016-01-12 LAB — CULTURE, BLOOD (ROUTINE X 2)

## 2016-01-12 LAB — COMPREHENSIVE METABOLIC PANEL
ALT: 22 U/L (ref 17–63)
ANION GAP: 11 (ref 5–15)
AST: 24 U/L (ref 15–41)
Albumin: 2.3 g/dL — ABNORMAL LOW (ref 3.5–5.0)
Alkaline Phosphatase: 61 U/L (ref 38–126)
BILIRUBIN TOTAL: 0.8 mg/dL (ref 0.3–1.2)
BUN: 17 mg/dL (ref 6–20)
CO2: 24 mmol/L (ref 22–32)
Calcium: 9.1 mg/dL (ref 8.9–10.3)
Chloride: 104 mmol/L (ref 101–111)
Creatinine, Ser: 1.11 mg/dL (ref 0.61–1.24)
GFR, EST NON AFRICAN AMERICAN: 59 mL/min — AB (ref >60)
Glucose, Bld: 204 mg/dL — ABNORMAL HIGH (ref 65–99)
POTASSIUM: 3.5 mmol/L (ref 3.5–5.1)
Sodium: 139 mmol/L (ref 135–145)
TOTAL PROTEIN: 6.2 g/dL — AB (ref 6.5–8.1)

## 2016-01-12 LAB — CBC
HEMATOCRIT: 27.5 % — AB (ref 39.0–52.0)
Hemoglobin: 8.8 g/dL — ABNORMAL LOW (ref 13.0–17.0)
MCH: 27.2 pg (ref 26.0–34.0)
MCHC: 32 g/dL (ref 30.0–36.0)
MCV: 85.1 fL (ref 78.0–100.0)
Platelets: 319 10*3/uL (ref 150–400)
RBC: 3.23 MIL/uL — ABNORMAL LOW (ref 4.22–5.81)
RDW: 17 % — AB (ref 11.5–15.5)
WBC: 9 10*3/uL (ref 4.0–10.5)

## 2016-01-12 LAB — GLUCOSE, CAPILLARY
GLUCOSE-CAPILLARY: 238 mg/dL — AB (ref 65–99)
GLUCOSE-CAPILLARY: 138 mg/dL — AB (ref 65–99)
GLUCOSE-CAPILLARY: 259 mg/dL — AB (ref 65–99)
GLUCOSE-CAPILLARY: 225 mg/dL — AB (ref 65–99)

## 2016-01-12 SURGERY — ECHOCARDIOGRAM, TRANSESOPHAGEAL
Anesthesia: Moderate Sedation

## 2016-01-12 MED ORDER — BUTAMBEN-TETRACAINE-BENZOCAINE 2-2-14 % EX AERO
INHALATION_SPRAY | CUTANEOUS | Status: DC | PRN
  Administered 2016-01-12: 2 via TOPICAL

## 2016-01-12 MED ORDER — FENTANYL CITRATE (PF) 100 MCG/2ML IJ SOLN
INTRAMUSCULAR | Status: AC
  Filled 2016-01-12: qty 2

## 2016-01-12 MED ORDER — MIDAZOLAM HCL 5 MG/ML IJ SOLN
INTRAMUSCULAR | Status: AC
  Filled 2016-01-12: qty 2

## 2016-01-12 MED ORDER — INSULIN GLARGINE 100 UNIT/ML ~~LOC~~ SOLN
25.0000 [IU] | Freq: Every day | SUBCUTANEOUS | Status: DC
  Administered 2016-01-12 – 2016-01-16 (×5): 25 [IU] via SUBCUTANEOUS
  Filled 2016-01-12 (×7): qty 0.25

## 2016-01-12 MED ORDER — FENTANYL CITRATE (PF) 100 MCG/2ML IJ SOLN
INTRAMUSCULAR | Status: DC | PRN
  Administered 2016-01-12: 25 ug via INTRAVENOUS

## 2016-01-12 MED ORDER — MIDAZOLAM HCL 10 MG/2ML IJ SOLN
INTRAMUSCULAR | Status: DC | PRN
  Administered 2016-01-12: 2 mg via INTRAVENOUS

## 2016-01-12 NOTE — Progress Notes (Signed)
  Echocardiogram Echocardiogram Transesophageal has been performed.  Douglas Carrillo 01/12/2016, 12:30 PM

## 2016-01-12 NOTE — Interval H&P Note (Signed)
History and Physical Interval Note:  01/12/2016 12:05 PM  Douglas Carrillo  has presented today for surgery, with the diagnosis of bacteremia  The various methods of treatment have been discussed with the patient and family. After consideration of risks, benefits and other options for treatment, the patient has consented to  Procedure(s): TRANSESOPHAGEAL ECHOCARDIOGRAM (TEE) (N/A) as a surgical intervention .  The patient's history has been reviewed, patient examined, no change in status, stable for surgery.  I have reviewed the patient's chart and labs.  Questions were answered to the patient's satisfaction.     Shauni Henner

## 2016-01-12 NOTE — Progress Notes (Signed)
[facility]   Reason for visit: Follow up on Staph aureus bacteremia  Interval History: TEE without vegetation.  Repeat blood cultures sent. Feels better. Afebrile, no chills.   Physical Exam: Constitutional:  Filed Vitals:   01/12/16 1300 01/12/16 1358  BP: 157/64 133/64  Pulse: 98 102  Temp:  101 F (38.3 C)  Resp: 23 18   patient appears in NAD Respiratory: Normal respiratory effort; CTA B Cardiovascular: RRR + murmur  Review of Systems: Constitutional: negative for fevers and chills Gastrointestinal: negative for nausea, vomiting and diarrhea  Lab Results  Component Value Date   WBC 9.0 01/12/2016   HGB 8.8* 01/12/2016   HCT 27.5* 01/12/2016   MCV 85.1 01/12/2016   PLT 319 01/12/2016    Lab Results  Component Value Date   CREATININE 1.11 01/12/2016   BUN 17 01/12/2016   NA 139 01/12/2016   K 3.5 01/12/2016   CL 104 01/12/2016   CO2 24 01/12/2016    Lab Results  Component Value Date   ALT 22 01/12/2016   AST 24 01/12/2016   ALKPHOS 61 01/12/2016     Microbiology: Recent Results (from the past 240 hour(s))  Culture, blood (routine x 2)     Status: None   Collection Time: 01/09/16  7:05 AM  Result Value Ref Range Status   Specimen Description BLOOD RIGHT ANTECUBITAL  Final   Special Requests BOTTLES DRAWN AEROBIC AND ANAEROBIC 5MLS  Final   Culture  Setup Time   Final    GRAM POSITIVE COCCI IN CLUSTERS IN BOTH AEROBIC AND ANAEROBIC BOTTLES CRITICAL RESULT CALLED TO, READ BACK BY AND VERIFIED WITH: J. MILLER AT 0236 ON [id number] BY Craig Guess    Culture METHICILLIN RESISTANT STAPHYLOCOCCUS AUREUS  Final   Report Status 01/12/2016 FINAL  Final   Organism ID, Bacteria METHICILLIN RESISTANT STAPHYLOCOCCUS AUREUS  Final      Susceptibility   Methicillin resistant staphylococcus aureus - MIC*    CIPROFLOXACIN <=0.5 SENSITIVE Sensitive     ERYTHROMYCIN <=0.25 SENSITIVE Sensitive     GENTAMICIN <=0.5 SENSITIVE Sensitive    OXACILLIN >=4 RESISTANT Resistant     TETRACYCLINE <=1 SENSITIVE Sensitive     VANCOMYCIN <=0.5 SENSITIVE Sensitive     TRIMETH/SULFA <=10 SENSITIVE Sensitive     CLINDAMYCIN <=0.25 SENSITIVE Sensitive     RIFAMPIN <=0.5 SENSITIVE Sensitive     Inducible Clindamycin NEGATIVE Sensitive     * METHICILLIN RESISTANT STAPHYLOCOCCUS AUREUS  Culture, blood (routine x 2)     Status: None   Collection Time: 01/09/16  7:20 AM  Result Value Ref Range Status   Specimen Description BLOOD LEFT ANTECUBITAL  Final   Special Requests BOTTLES DRAWN AEROBIC AND ANAEROBIC 10MLS  Final   Culture  Setup Time   Final    GRAM POSITIVE COCCI IN CLUSTERS IN BOTH AEROBIC AND ANAEROBIC BOTTLES CRITICAL RESULT CALLED TO, READ BACK BY AND VERIFIED WITH: J MILLER @0236  01/10/16 MKELLY    Culture   Final    STAPHYLOCOCCUS AUREUS SUSCEPTIBILITIES PERFORMED ON PREVIOUS CULTURE WITHIN THE LAST 5 DAYS.    Report Status 01/12/2016 FINAL  Final  Respiratory virus panel     Status: None   Collection Time: 01/09/16  9:20 AM  Result Value Ref Range Status   Source - RVPAN NASAL SWAB  Corrected   Respiratory Syncytial Virus A Negative Negative Final   Respiratory Syncytial Virus B Negative Negative Final   Influenza A Negative Negative Final  Influenza B Negative Negative Final   Parainfluenza 1 Negative Negative Final   Parainfluenza 2 Negative Negative Final   Parainfluenza 3 Negative Negative Final   Metapneumovirus Negative Negative Final   Rhinovirus Negative Negative Final   Adenovirus Negative Negative Final    Comment: (NOTE) faxed results 01/17/187 @ 1926 lr Performed At: [facility] [address] [place], [place] [id number] Lindon Romp MD [id number]   Urine culture     Status: None   Collection Time: 01/09/16 11:46 AM  Result Value Ref Range Status   Specimen Description URINE, CATHETERIZED  Final   Special Requests NONE  Final   Culture NO GROWTH 1 DAY  Final   Report Status  01/10/2016 FINAL  Final  MRSA PCR Screening     Status: Abnormal   Collection Time: 01/09/16  3:01 PM  Result Value Ref Range Status   MRSA by PCR POSITIVE (A) NEGATIVE Final    Comment:        The GeneXpert MRSA Assay (FDA approved for NASAL specimens only), is one component of a comprehensive MRSA colonization surveillance program. It is not intended to diagnose MRSA infection nor to guide or monitor treatment for MRSA infections. RESULT CALLED TO, READ BACK BY AND VERIFIED WITH: Jeannene Patella RN 17:15 01/09/16 ([email or web address])     Impression/Plan:  1. Staph aureus bacteremia - negative vegetation. Monitor blood cultures.  Picc line ok Monday if cultures remain negative and would treat for two weeks then. 2.  CKD - will need to monitor with vancomycin per pharmacy.

## 2016-01-12 NOTE — Progress Notes (Signed)
CSW left message for patient's daughter, [name], regarding discharge plans.  Percell Locus Ryann Leavitt LCSWA [phone]

## 2016-01-12 NOTE — Op Note (Signed)
INDICATIONS: staph bacteremia  PROCEDURE:   Informed consent was obtained prior to the procedure. The risks, benefits and alternatives for the procedure were discussed and the patient comprehended these risks. Risks include, but are not limited to, cough, sore throat, vomiting, nausea, somnolence, esophageal and stomach trauma or perforation, bleeding, low blood pressure, aspiration, pneumonia, infection, trauma to the teeth and death.   After a procedural time-out, the oropharynx was anesthetized with 20% benzocaine spray. The patient was given 2 mg versed and 25 mcg fentanyl for moderate sedation. The transesophageal probe was inserted in the esophagus and stomach without difficulty and multiple views were obtained. The patient was kept under observation until the patient left the procedure room. The patient left the procedure room in stable condition.   Agitated microbubble saline contrast was not administered.  COMPLICATIONS:   There were no immediate complications.  FINDINGS:  Severe aortic stenosis, heavy calcification. Mild-moderate aortic insufficiency. Normal LV systolic function. Moderate LVH. No vegetations seen. Moderate atheromatous plaque in the aortic arch  RECOMMENDATIONS:   No echo evidence for endocarditis. Heavy aortic valve degenerative calcification could mask a small vegetation. Consider repeat TEE in 2 weeks if there is persistent bacteremia  Time Spent Directly with the Patient:  30 minutes   Douglas Carrillo 01/12/2016, 12:23 PM

## 2016-01-12 NOTE — Progress Notes (Addendum)
MD notified that patient had run of SVT and is having a lot of PVCs. Patient is asymptomatic. Will continue to monitor.

## 2016-01-12 NOTE — Progress Notes (Addendum)
[facility] PROGRESS NOTE  Douglas Carrillo [id number] DOB: [birth year] DOA: 01/09/2016 PCP: Limmie Patricia, MD  Length of stay: 3   Assessment/Plan: Principal Problem:   Sepsis ([place]) Active Problems:   CAD (coronary artery disease) of artery bypass graft   Essential hypertension   DM2 (diabetes mellitus, type 2) (HCC)   Aortic valve stenosis   COPD (chronic obstructive pulmonary disease) (HCC)   Stroke, acute, thrombotic ([place])   Acute respiratory failure ([place])   HCAP (healthcare-associated pneumonia)   CKD (chronic kidney disease)     82 y.o. male with a past medical historyHTN, HLD, CAD, OSA, COPD, DM, GERD, CHF, CVA, prostate cancer who presents with sepsis secondary to HCAP.   on oxygen at night, severe aortic stenosis and recent CVA and 12/12/15 ( Dr. Leonie Man consulted and felt that stroke due to sequela of cardiac cath and to continue Plavix for secondary stroke prevention. Carotid dopplers without significant ICA stenosis.) presents to emergency department with a persistent gradual worsening shortness of breath/cough and fever. Initial evaluation reveals sepsis and acute respiratory failure secondary to pneumonia.   Patient reports he has been home from hospital for 10 days and has been coughing the entire time. Went to see [place] PCP and was provided with IM steroids and by mouth steroid taper which he finished. Reports today cough has become worse it sounds wet but is nonproductive. Associated symptoms include fever decreased appetite generalized weakness. He denies chest pain palpitations abdominal pain nausea vomiting diarrhea. He denies dysuria hematuria frequency or urgency. He denies lower extremity edema or orthopnea.      Assessment and plan #1. Sepsis with staph aureus bacteremia, likely related to healthcare associated pneumonia  ,right middle and lower lobe pneumonia Doubt it is Staph aureus pneumonia. Max temp 102.8, lactic acid 2.54, heart heart rate  112 respiratory rate 30 blood pressure stable. ABG with pH of 7.47, PCO2 27.4, PO2 79.0, bicarbonate 19.8. He received 2 L of fluid in the emergency department and antibiotics initiated Continue telemetry Vancomycin and cefazolin pending sensitivities Initial lactic acid 1.45, pro calcitonin 5.41, influenza PCR negative, Blood cultures positive growing staph aureus in all 4 bottles, follow sensitivity panel, currently on 2 L of nasal cannula Speech therapy evaluation recommends dysphagia diet with nectar thick liquids TEE shows No echo evidence for endocarditis. Heavy aortic valve degenerative calcification could mask a small vegetation. Consider repeat TEE in 2 weeks if there is persistent bacteremia  #2. Acute respiratory failure with hypoxia  likely related to pneumonia. ABG as noted above. Oxygen saturation level 85% on room air. Improved now  95% on 2 L nasal cannula. He wears oxygen at home only at night. Recently completed a prednisone taper provided by PCP -Continue oxygen supplementation -Nebulizers -Antibiotics per healthcare associated pneumonia protocol -Wean oxygen as able   #3. Healthcare associated pneumonia. Chest x-ray consistent with right middle and lower lobe pneumonia. Some concern for aspiration. See #1   #4. Recent history of stroke. See discharge summary dated January 6. Spent 1 week inpatient rehabilitation. Review indicates some right upper and right lower extremity weakness. -Appears stable at baseline -Physical therapy -Continue Plavix/aspirin  #5. Hypertension. Blood pressure controlled in the emergency department. Home medications include Lasix, imdur, Toprol. Will hold Lasix and imdur -Monitor closely  #6. Diabetes. Uncontrolled, 294-397  Increase  Lantus to 25 Units in addition to SSI Hold Actos  #7. Chronic kidney disease stage II. Creatinine 1.31 on admission. This appears to be close  to his baseline -Monitor intake and output -Check in the  morning  #8. CAD. With abnormal troponin, likely demand ischemia, No chest pain. EKG as above. Initial troponin negative Troponin peaked at  2.98,> 2.21>1.64 Continue aspirin and Plavix Cardiology consultation recommends conservative mx  No need for  2-D echo per cards    #9. Aortic stenosis/history of diastolic dysfunction. Echo with EF of [id number] grade 2 diastolic dysfunction. ENP 338. Does not appear overloaded -We'll hold his home Lasix for now, continue metoprolol       DVT prophylaxsis scd's  Code Status:      Code Status Orders        Start     Ordered   01/09/16 1429  Full code   Continuous     01/09/16 1428    Code Status History      Family Communication: Discussed in detail with the patient, all imaging results, lab results explained to the patient   Disposition Plan:  Continue telemetry, PT/OT consultation, cir consult     Consultants:  None   Procedures:  None   Antibiotics: Anti-infectives    Start     Dose/Rate Route Frequency Ordered Stop   01/11/16 2200  ceFAZolin (ANCEF) IVPB 2 g/50 mL premix  Status:  Discontinued     2 g 100 mL/hr over 30 Minutes Intravenous 3 times per day 01/11/16 1430 01/11/16 1434   01/11/16 2000  ceFEPIme (MAXIPIME) 2 g in dextrose 5 % 50 mL IVPB  Status:  Discontinued     2 g 100 mL/hr over 30 Minutes Intravenous Every 12 hours 01/11/16 1243 01/11/16 1413   01/11/16 1800  [MAR Hold]  vancomycin (VANCOCIN) IVPB 750 mg/150 ml premix     (MAR Hold since 01/12/16 1120)   750 mg 150 mL/hr over 60 Minutes Intravenous Every 12 hours 01/11/16 1242     01/11/16 1800  [MAR Hold]  ceFAZolin (ANCEF) IVPB 2 g/50 mL premix     (MAR Hold since 01/12/16 1120)   2 g 100 mL/hr over 30 Minutes Intravenous Every 8 hours 01/11/16 1434     01/11/16 1500  ceFAZolin (ANCEF) IVPB 2 g/50 mL premix  Status:  Discontinued     2 g 100 mL/hr over 30 Minutes Intravenous NOW 01/11/16 1431 01/11/16 1434   01/10/16 0730  ceFEPIme (MAXIPIME) 2 g in  dextrose 5 % 50 mL IVPB  Status:  Discontinued     2 g 100 mL/hr over 30 Minutes Intravenous Every 24 hours 01/09/16 0807 01/11/16 1243   01/10/16 0730  vancomycin (VANCOCIN) 1,250 mg in sodium chloride 0.9 % 250 mL IVPB  Status:  Discontinued     1,250 mg 166.7 mL/hr over 90 Minutes Intravenous Every 24 hours 01/09/16 0807 01/11/16 1242   01/09/16 0730  vancomycin (VANCOCIN) 1,750 mg in sodium chloride 0.9 % 500 mL IVPB  Status:  Discontinued     1,750 mg 250 mL/hr over 120 Minutes Intravenous  Once 01/09/16 0724 01/09/16 0725   01/09/16 0730  vancomycin (VANCOCIN) 1,500 mg in sodium chloride 0.9 % 500 mL IVPB     1,500 mg 250 mL/hr over 120 Minutes Intravenous  Once 01/09/16 0725 01/09/16 0950   01/09/16 0715  ceFEPIme (MAXIPIME) 2 g in dextrose 5 % 50 mL IVPB     2 g 100 mL/hr over 30 Minutes Intravenous  Once 01/09/16 0703 01/09/16 0809   01/09/16 0715  vancomycin (VANCOCIN) IVPB 1000 mg/200 mL premix  Status:  Discontinued  1,000 mg 200 mL/hr over 60 Minutes Intravenous  Once 01/09/16 0703 01/09/16 0724         HPI/Subjective: Deneis CP Breathing OK    Objective: Filed Vitals:   01/12/16 0053 01/12/16 0737 01/12/16 0740 01/12/16 1120  BP:   136/65 162/73  Pulse: 82  86 94  Temp:   98.6 F (37 C) 100 F (37.8 C)  TempSrc:   Oral Oral  Resp: 24  28 25   Height:    6' (1.829 m)  Weight:  83.371 kg (183 lb 12.8 oz)  83.008 kg (183 lb)  SpO2: 94%  100% 95%    Intake/Output Summary (Last 24 hours) at 01/12/16 1150 Last data filed at 01/12/16 0602  Gross per 24 hour  Intake 1688.33 ml  Output   1000 ml  Net 688.33 ml    Exam:  General: No acute respiratory distress Lungs: Clear to auscultation bilaterally , some  wheezes , no crackles Cardiovascular: Regular rate and rhythm without murmur gallop or rub normal S1 and S2 Abdomen: Nontender, nondistended, soft, bowel sounds positive, no rebound, no ascites, no appreciable mass Extremities: No significant  cyanosis, clubbing, or edema bilateral lower extremities     Data Review   Micro Results Recent Results (from the past 240 hour(s))  Culture, blood (routine x 2)     Status: None   Collection Time: 01/09/16  7:05 AM  Result Value Ref Range Status   Specimen Description BLOOD RIGHT ANTECUBITAL  Final   Special Requests BOTTLES DRAWN AEROBIC AND ANAEROBIC 5MLS  Final   Culture  Setup Time   Final    GRAM POSITIVE COCCI IN CLUSTERS IN BOTH AEROBIC AND ANAEROBIC BOTTLES CRITICAL RESULT CALLED TO, READ BACK BY AND VERIFIED WITH: J. MILLER AT 0236 ON [id number] BY Craig Guess    Culture METHICILLIN RESISTANT STAPHYLOCOCCUS AUREUS  Final   Report Status 01/12/2016 FINAL  Final   Organism ID, Bacteria METHICILLIN RESISTANT STAPHYLOCOCCUS AUREUS  Final      Susceptibility   Methicillin resistant staphylococcus aureus - MIC*    CIPROFLOXACIN <=0.5 SENSITIVE Sensitive     ERYTHROMYCIN <=0.25 SENSITIVE Sensitive     GENTAMICIN <=0.5 SENSITIVE Sensitive     OXACILLIN >=4 RESISTANT Resistant     TETRACYCLINE <=1 SENSITIVE Sensitive     VANCOMYCIN <=0.5 SENSITIVE Sensitive     TRIMETH/SULFA <=10 SENSITIVE Sensitive     CLINDAMYCIN <=0.25 SENSITIVE Sensitive     RIFAMPIN <=0.5 SENSITIVE Sensitive     Inducible Clindamycin NEGATIVE Sensitive     * METHICILLIN RESISTANT STAPHYLOCOCCUS AUREUS  Culture, blood (routine x 2)     Status: None   Collection Time: 01/09/16  7:20 AM  Result Value Ref Range Status   Specimen Description BLOOD LEFT ANTECUBITAL  Final   Special Requests BOTTLES DRAWN AEROBIC AND ANAEROBIC 10MLS  Final   Culture  Setup Time   Final    GRAM POSITIVE COCCI IN CLUSTERS IN BOTH AEROBIC AND ANAEROBIC BOTTLES CRITICAL RESULT CALLED TO, READ BACK BY AND VERIFIED WITH: J MILLER @0236  01/10/16 MKELLY    Culture   Final    STAPHYLOCOCCUS AUREUS SUSCEPTIBILITIES PERFORMED ON PREVIOUS CULTURE WITHIN THE LAST 5 DAYS.    Report Status 01/12/2016 FINAL  Final  Respiratory virus panel      Status: None   Collection Time: 01/09/16  9:20 AM  Result Value Ref Range Status   Source - RVPAN NASAL SWAB  Corrected   Respiratory Syncytial Virus A Negative Negative Final  Respiratory Syncytial Virus B Negative Negative Final   Influenza A Negative Negative Final   Influenza B Negative Negative Final   Parainfluenza 1 Negative Negative Final   Parainfluenza 2 Negative Negative Final   Parainfluenza 3 Negative Negative Final   Metapneumovirus Negative Negative Final   Rhinovirus Negative Negative Final   Adenovirus Negative Negative Final    Comment: (NOTE) faxed results 01/17/187 @ 1926 lr Performed At: [facility] [address] [place], [place] [id number] Lindon Romp MD [id number]   Urine culture     Status: None   Collection Time: 01/09/16 11:46 AM  Result Value Ref Range Status   Specimen Description URINE, CATHETERIZED  Final   Special Requests NONE  Final   Culture NO GROWTH 1 DAY  Final   Report Status 01/10/2016 FINAL  Final  MRSA PCR Screening     Status: Abnormal   Collection Time: 01/09/16  3:01 PM  Result Value Ref Range Status   MRSA by PCR POSITIVE (A) NEGATIVE Final    Comment:        The GeneXpert MRSA Assay (FDA approved for NASAL specimens only), is one component of a comprehensive MRSA colonization surveillance program. It is not intended to diagnose MRSA infection nor to guide or monitor treatment for MRSA infections. RESULT CALLED TO, READ BACK BY AND VERIFIED WITH: Jeannene Patella RN 17:15 01/09/16 ([email or web address])     Radiology Reports Dg Chest 2 View  01/09/2016  CLINICAL DATA:  Cough and shortness of breath for a week. EXAM: CHEST  2 VIEW COMPARISON:  January 04, 2016. FINDINGS: Stable cardiomegaly. Status post coronary artery bypass graft. No pneumothorax or significant pleural effusion is noted. There is interval development of right middle and lower lobe airspace opacities concerning for pneumonia. Left lung is clear. Bony thorax  is unremarkable. IMPRESSION: Findings consistent with right middle and lower lobe pneumonia. Continued radiographic follow-up is recommended to ensure resolution. Electronically Signed   By: Marijo Conception, M.D.   On: 01/09/2016 08:08   Dg Chest 2 View  01/04/2016  CLINICAL DATA:  Wheezing, cough, congestion for 1 week EXAM: CHEST  2 VIEW COMPARISON:  12/13/2015 FINDINGS: Cardiomediastinal silhouette is stable. Central mild vascular congestion without convincing pulmonary edema. Status post CABG. Mild infrahilar bronchitic changes. No segmental infiltrate. Mild hyperinflation. IMPRESSION: Central mild vascular congestion without convincing pulmonary edema. Mild infrahilar bronchitic changes. Status post CABG. Mild hyperinflation. Electronically Signed   By: Lahoma Crocker M.D.   On: 01/04/2016 15:52   Mr Jodene Nam Head Wo Contrast  12/13/2015  CLINICAL DATA:  Cardiac catheterization yesterday. Awoke with right-sided arm and leg weakness. EXAM: MRI HEAD WITHOUT AND WITH CONTRAST MRA HEAD WITHOUT CONTRAST MRA NECK WITHOUT AND WITH CONTRAST TECHNIQUE: Multiplanar, multiecho pulse sequences of the brain and surrounding structures were obtained without and with intravenous contrast. Angiographic images of the Circle of Willis were obtained using MRA technique without intravenous contrast. Angiographic images of the neck were obtained using MRA technique without and with intravenous contrast. Carotid stenosis measurements (when applicable) are obtained utilizing NASCET criteria, using the distal internal carotid diameter as the denominator. CONTRAST:  29mL MULTIHANCE GADOBENATE DIMEGLUMINE 529 MG/ML IV SOLN COMPARISON:  CT same day FINDINGS: MRI HEAD FINDINGS Diffusion imaging shows acute infarction within the ventral surface of the left medulla. No other acute infarction. Elsewhere the brainstem and cerebellum are normal. The cerebral hemispheres show mild chronic small-vessel ischemic changes of the white matter, fairly  typical for age. Small focus of  hemosiderin deposition in the right parietal deep white matter. No cortical or large vessel territory infarction. No mass lesion, acute hemorrhage, hydrocephalus or extra-axial collection. No pituitary mass. No inflammatory sinus disease. Small mastoid effusions bilaterally. After contrast administration, no abnormal enhancement of the brain occurs. MRA HEAD FINDINGS Both internal carotid arteries are widely patent into the brain. The anterior and middle cerebral vessels are patent without proximal stenosis, aneurysm or vascular malformation. Both vertebral arteries are patent with the right being dominant. No basilar stenosis. Anterior inferior cerebellar arteries, superior cerebellar arteries and posterior cerebral arteries appear patent and normal. Posterior inferior cerebellar arteries not demonstrated, possibly congenitally hypoplastic or aplastic. MRA NECK FINDINGS Branching pattern of the brachiocephalic vessels from the arch is normal. No origin stenoses. Both common carotid arteries widely patent to their respective bifurcation. Mild atherosclerotic disease at both carotid bifurcations but no stenosis of the internal carotid artery on either side. 50% stenosis of the external carotid artery on the right. Vertebral artery origins are not well seen because of physiologic motion in the chest. I think the left vertebral artery arises from the arch. In the neck, both vertebral arteries are widely patent without focal stenosis. IMPRESSION: Acute infarction along the ventral surface of the left medulla. No hemorrhage or swelling. Mild chronic small-vessel ischemic changes elsewhere affecting the brain, fairly typical for age. No carotid bifurcation stenosis other than 50% narrowing of the external carotid artery on the right. Vertebral artery origins can't be accurately evaluated because of physiologic motion. Small left vertebral artery arises directly from the arch. Vertebral  arteries both widely patent in the neck to the basilar. Electronically Signed   By: Nelson Chimes M.D.   On: 12/13/2015 15:54   Mr Angiogram Neck W Wo Contrast  12/13/2015  CLINICAL DATA:  Cardiac catheterization yesterday. Awoke with right-sided arm and leg weakness. EXAM: MRI HEAD WITHOUT AND WITH CONTRAST MRA HEAD WITHOUT CONTRAST MRA NECK WITHOUT AND WITH CONTRAST TECHNIQUE: Multiplanar, multiecho pulse sequences of the brain and surrounding structures were obtained without and with intravenous contrast. Angiographic images of the Circle of Willis were obtained using MRA technique without intravenous contrast. Angiographic images of the neck were obtained using MRA technique without and with intravenous contrast. Carotid stenosis measurements (when applicable) are obtained utilizing NASCET criteria, using the distal internal carotid diameter as the denominator. CONTRAST:  110mL MULTIHANCE GADOBENATE DIMEGLUMINE 529 MG/ML IV SOLN COMPARISON:  CT same day FINDINGS: MRI HEAD FINDINGS Diffusion imaging shows acute infarction within the ventral surface of the left medulla. No other acute infarction. Elsewhere the brainstem and cerebellum are normal. The cerebral hemispheres show mild chronic small-vessel ischemic changes of the white matter, fairly typical for age. Small focus of hemosiderin deposition in the right parietal deep white matter. No cortical or large vessel territory infarction. No mass lesion, acute hemorrhage, hydrocephalus or extra-axial collection. No pituitary mass. No inflammatory sinus disease. Small mastoid effusions bilaterally. After contrast administration, no abnormal enhancement of the brain occurs. MRA HEAD FINDINGS Both internal carotid arteries are widely patent into the brain. The anterior and middle cerebral vessels are patent without proximal stenosis, aneurysm or vascular malformation. Both vertebral arteries are patent with the right being dominant. No basilar stenosis. Anterior  inferior cerebellar arteries, superior cerebellar arteries and posterior cerebral arteries appear patent and normal. Posterior inferior cerebellar arteries not demonstrated, possibly congenitally hypoplastic or aplastic. MRA NECK FINDINGS Branching pattern of the brachiocephalic vessels from the arch is normal. No origin stenoses. Both common carotid arteries  widely patent to their respective bifurcation. Mild atherosclerotic disease at both carotid bifurcations but no stenosis of the internal carotid artery on either side. 50% stenosis of the external carotid artery on the right. Vertebral artery origins are not well seen because of physiologic motion in the chest. I think the left vertebral artery arises from the arch. In the neck, both vertebral arteries are widely patent without focal stenosis. IMPRESSION: Acute infarction along the ventral surface of the left medulla. No hemorrhage or swelling. Mild chronic small-vessel ischemic changes elsewhere affecting the brain, fairly typical for age. No carotid bifurcation stenosis other than 50% narrowing of the external carotid artery on the right. Vertebral artery origins can't be accurately evaluated because of physiologic motion. Small left vertebral artery arises directly from the arch. Vertebral arteries both widely patent in the neck to the basilar. Electronically Signed   By: Nelson Chimes M.D.   On: 12/13/2015 15:54   Mr Jeri Cos [id number] Contrast  12/13/2015  CLINICAL DATA:  Cardiac catheterization yesterday. Awoke with right-sided arm and leg weakness. EXAM: MRI HEAD WITHOUT AND WITH CONTRAST MRA HEAD WITHOUT CONTRAST MRA NECK WITHOUT AND WITH CONTRAST TECHNIQUE: Multiplanar, multiecho pulse sequences of the brain and surrounding structures were obtained without and with intravenous contrast. Angiographic images of the Circle of Willis were obtained using MRA technique without intravenous contrast. Angiographic images of the neck were obtained using MRA technique  without and with intravenous contrast. Carotid stenosis measurements (when applicable) are obtained utilizing NASCET criteria, using the distal internal carotid diameter as the denominator. CONTRAST:  23mL MULTIHANCE GADOBENATE DIMEGLUMINE 529 MG/ML IV SOLN COMPARISON:  CT same day FINDINGS: MRI HEAD FINDINGS Diffusion imaging shows acute infarction within the ventral surface of the left medulla. No other acute infarction. Elsewhere the brainstem and cerebellum are normal. The cerebral hemispheres show mild chronic small-vessel ischemic changes of the white matter, fairly typical for age. Small focus of hemosiderin deposition in the right parietal deep white matter. No cortical or large vessel territory infarction. No mass lesion, acute hemorrhage, hydrocephalus or extra-axial collection. No pituitary mass. No inflammatory sinus disease. Small mastoid effusions bilaterally. After contrast administration, no abnormal enhancement of the brain occurs. MRA HEAD FINDINGS Both internal carotid arteries are widely patent into the brain. The anterior and middle cerebral vessels are patent without proximal stenosis, aneurysm or vascular malformation. Both vertebral arteries are patent with the right being dominant. No basilar stenosis. Anterior inferior cerebellar arteries, superior cerebellar arteries and posterior cerebral arteries appear patent and normal. Posterior inferior cerebellar arteries not demonstrated, possibly congenitally hypoplastic or aplastic. MRA NECK FINDINGS Branching pattern of the brachiocephalic vessels from the arch is normal. No origin stenoses. Both common carotid arteries widely patent to their respective bifurcation. Mild atherosclerotic disease at both carotid bifurcations but no stenosis of the internal carotid artery on either side. 50% stenosis of the external carotid artery on the right. Vertebral artery origins are not well seen because of physiologic motion in the chest. I think the left  vertebral artery arises from the arch. In the neck, both vertebral arteries are widely patent without focal stenosis. IMPRESSION: Acute infarction along the ventral surface of the left medulla. No hemorrhage or swelling. Mild chronic small-vessel ischemic changes elsewhere affecting the brain, fairly typical for age. No carotid bifurcation stenosis other than 50% narrowing of the external carotid artery on the right. Vertebral artery origins can't be accurately evaluated because of physiologic motion. Small left vertebral artery arises directly from the arch. Vertebral  arteries both widely patent in the neck to the basilar. Electronically Signed   By: Nelson Chimes M.D.   On: 12/13/2015 15:54   Dg Swallowing Func-speech Pathology  01/10/2016  Objective Swallowing Evaluation:   Patient Details Name: Douglas Carrillo MRN: [id number] Date of Birth: March 07, 1928 Today's Date: 01/10/2016 Time: SLP Start Time (ACUTE ONLY): 0930-SLP Stop Time (ACUTE ONLY): 0945 SLP Time Calculation (min) (ACUTE ONLY): 15 min Past Medical History: Past Medical History Diagnosis Date . Hypertension  . High cholesterol  . Prostate cancer ([place])  . CAD (coronary artery disease)  . OSA (obstructive sleep apnea)  . COPD (chronic obstructive pulmonary disease) (HCC)    MODERATE . Diabetes mellitus ([place])  . GERD (gastroesophageal reflux disease)  . Vertigo  . Arthritis    OA . Anemia  . CHF (congestive heart failure) (HCC)    grade 2 diastolic dysfuction . CKD (chronic kidney disease)  . Stroke (cerebrum) ([place])    11/2015 . HCAP (healthcare-associated pneumonia)    12/2015 Past Surgical History: Past Surgical History Procedure Laterality Date . Coronary artery bypass graft  03/29/1999   x 4:  LIMA to the LAD,SVG to the diagonal branch of the LAD,SVG to the intermediate coronary artery & SVG to the posterior descending branch of the RCA. . Colon surgery     x 4 . Nm myocar perf wall motion  04/28/2012   Low Risk Scan . Prostate surgery   . Cardiac  catheterization   . Cardiac catheterization N/A 12/12/2015   Procedure: Right/Left Heart Cath and Coronary/Graft Angiography;  Surgeon: Troy Sine, MD;  Location: [facility];  Service: Cardiovascular;  Laterality: N/A; . Peripheral vascular catheterization N/A 12/12/2015   Procedure: Aortic Arch Angiography;  Surgeon: Troy Sine, MD;  Location: [facility];  Service: Cardiovascular;  Laterality: N/A; . Eye surgery   HPI: 82 y.o. 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 He is admitted for sepsis secondary to PNA (RML and RLL per imaging report). Subjective: pt alert, denies any difficulties with swallowing Assessment / Plan / Recommendation CHL IP CLINICAL IMPRESSIONS 01/10/2016 Therapy Diagnosis Mild oral phase dysphagia;Suspected primary esophageal dysphagia Clinical Impression Pt demonstrates no significant oropharyngeal dysphagia worrisome for aspiration risk. There is slow bolus formation with occasional premature spillage to valleculae. Initial sips of nectar showed a delay in swallow initiation, but as study progressed pt was taking consecutive sips of thin liquids with timely swallow. Strength WNL, no penetration or aspiration occurred despite attempt to tax system. Pt did have some delayed coughing; esophageal sweep showed appearence of significant stasis with purees which did not clear with sips of liquid. Pill transited with multiple bites and sips. Discussed esophageal precautions. Recommend regular diet and thin liquids. SLP will f/u for education and tolerance at bedside.  Impact on safety and function Moderate aspiration risk   CHL IP TREATMENT RECOMMENDATION 01/10/2016 Treatment Recommendations Therapy as outlined in treatment plan below   No flowsheet data found. CHL IP DIET RECOMMENDATION 01/10/2016 SLP Diet Recommendations Regular solids;Thin liquid Liquid Administration via Cup;Straw Medication Administration  Whole meds with puree Compensations Slow rate;Small sips/bites;Follow solids with liquid Postural Changes Remain semi-upright after after feeds/meals (Comment);Seated upright at 90 degrees   CHL IP OTHER RECOMMENDATIONS 01/10/2016 Recommended Consults -- Oral Care Recommendations Patient independent with oral care Other Recommendations --   CHL IP FOLLOW UP RECOMMENDATIONS 01/10/2016 Follow up Recommendations None   CHL IP  FREQUENCY AND DURATION 01/10/2016 Speech Therapy Frequency (ACUTE ONLY) min 1 x/week Treatment Duration 1 week      CHL IP ORAL PHASE 01/10/2016 Oral Phase Impaired Oral - Pudding Teaspoon -- Oral - Pudding Cup -- Oral - Honey Teaspoon -- Oral - Honey Cup -- Oral - Nectar Teaspoon -- Oral - Nectar Cup Delayed oral transit;Premature spillage Oral - Nectar Straw Delayed oral transit;Premature spillage Oral - Thin Teaspoon -- Oral - Thin Cup Premature spillage Oral - Thin Straw Premature spillage Oral - Puree WFL Oral - Mech Soft -- Oral - Regular WFL Oral - Multi-Consistency -- Oral - Pill -- Oral Phase - Comment --  CHL IP PHARYNGEAL PHASE 01/10/2016 Pharyngeal Phase Impaired Pharyngeal- Pudding Teaspoon -- Pharyngeal -- Pharyngeal- Pudding Cup -- Pharyngeal -- Pharyngeal- Honey Teaspoon -- Pharyngeal -- Pharyngeal- Honey Cup -- Pharyngeal -- Pharyngeal- Nectar Teaspoon -- Pharyngeal -- Pharyngeal- Nectar Cup Delayed swallow initiation-vallecula Pharyngeal -- Pharyngeal- Nectar Straw Delayed swallow initiation-pyriform sinuses Pharyngeal -- Pharyngeal- Thin Teaspoon -- Pharyngeal -- Pharyngeal- Thin Cup WFL Pharyngeal -- Pharyngeal- Thin Straw WFL Pharyngeal -- Pharyngeal- Puree WFL Pharyngeal -- Pharyngeal- Mechanical Soft -- Pharyngeal -- Pharyngeal- Regular WFL Pharyngeal -- Pharyngeal- Multi-consistency -- Pharyngeal -- Pharyngeal- Pill WFL Pharyngeal -- Pharyngeal Comment --  CHL IP CERVICAL ESOPHAGEAL PHASE 01/10/2016 Cervical Esophageal Phase Impaired Pudding Teaspoon -- Pudding Cup -- Honey  Teaspoon -- Honey Cup -- Nectar Teaspoon -- Nectar Cup -- Nectar Straw -- Thin Teaspoon -- Thin Cup -- Thin Straw -- Puree -- Mechanical Soft -- Regular -- Multi-consistency -- Pill -- Cervical Esophageal Comment -- CHL IP GO 12/14/2015 Functional Assessment Tool Used skilled clinical judgement Functional Limitations Motor speech Swallow Current Status [id number]) (None) Swallow Goal Status [id number]) (None) Swallow Discharge Status [id number]) (None) Motor Speech Current Status [id number]) [place] Motor Speech Goal Status [id number]) [place] Motor Speech Goal Status [id number]) [place] Spoken Language Comprehension Current Status [id number]) (None) Spoken Language Comprehension Goal Status [id number]) (None) Spoken Language Comprehension Discharge Status [id number]) (None) Spoken Language Expression Current Status [id number]) (None) Spoken Language Expression Goal Status [id number]) (None) Spoken Language Expression Discharge Status [id number]) (None) Attention Current Status LV:[id number]) (None) Attention Goal Status FV:[id number]) (None) Attention Discharge Status [id number]) (None) Memory Current Status AE:130515) (None) Memory Goal Status GI:[id number]) (None) Memory Discharge Status [id number]) (None) Voice Current Status [id number]) (None) Voice Goal Status [id number]) (None) Voice Discharge Status [id number]) (None) Other Speech-Language Pathology Functional Limitation [id number]) (None) Other Speech-Language Pathology Functional Limitation Goal Status [id number]) (None) Other Speech-Language Pathology Functional Limitation Discharge Status [phone]) (None) Herbie [place], MA CCC-SLP [phone] Lynann Beaver 01/10/2016, 10:15 AM                CBC  Recent Labs Lab 01/09/16 0704 01/10/16 0127 01/11/16 0604 01/12/16 0526  WBC 7.2 9.9 9.3 9.0  HGB 10.3* 8.2* 8.5* 8.8*  HCT 31.9* 25.7* 26.3* 27.5*  PLT 271 262 282 319  MCV 85.1 85.1 86.5 85.1  MCH 27.5 27.2 28.0 27.2  MCHC 32.3 31.9 32.3 32.0  RDW 17.1* 17.0* 17.2* 17.0*  LYMPHSABS 0.6*  --   --   --   MONOABS 0.3  --   --   --   EOSABS 0.0  --    --   --   BASOSABS 0.0  --   --   --     Chemistries   Recent Labs Lab 01/09/16 0704 01/10/16 0127 01/11/16 0604 01/12/16 0526  NA 133* 137 140 139  K 4.4 4.0 3.7 3.5  CL  103 107 110 104  CO2 20* 20* 24 24  GLUCOSE 325* 351* 184* 204*  BUN 21* 23* 20 17  CREATININE 1.31* 1.20 1.13 1.11  CALCIUM 9.4 9.2 9.1 9.1  AST 21 26 21 24   ALT 20 18 17 22   ALKPHOS 73 60 62 61  BILITOT 1.0 0.6 0.7 0.8   ------------------------------------------------------------------------------------------------------------------ estimated creatinine clearance is 53.4 mL/min (by C-G formula based on Cr of 1.11). ------------------------------------------------------------------------------------------------------------------ No results for input(s): HGBA1C in the last 72 hours. ------------------------------------------------------------------------------------------------------------------ No results for input(s): CHOL, HDL, LDLCALC, TRIG, CHOLHDL, LDLDIRECT in the last 72 hours. ------------------------------------------------------------------------------------------------------------------ No results for input(s): TSH, T4TOTAL, T3FREE, THYROIDAB in the last 72 hours.  Invalid input(s): FREET3 ------------------------------------------------------------------------------------------------------------------ No results for input(s): VITAMINB12, FOLATE, FERRITIN, TIBC, IRON, RETICCTPCT in the last 72 hours.  Coagulation profile  Recent Labs Lab 01/09/16 1926  INR 1.48    No results for input(s): DDIMER in the last 72 hours.  Cardiac Enzymes  Recent Labs Lab 01/10/16 0127 01/10/16 0755 01/10/16 1412  TROPONINI 2.98* 2.21* 1.64*   ------------------------------------------------------------------------------------------------------------------ Invalid input(s): POCBNP   CBG:  Recent Labs Lab 01/11/16 0805 01/11/16 1217 01/11/16 1800 01/11/16 2138 01/12/16 0800  GLUCAP 173*  193* 304* 211* 238*       Studies: No results found.    Lab Results  Component Value Date   HGBA1C 6.8* 12/14/2015   Lab Results  Component Value Date   LDLCALC 53 12/14/2015   CREATININE 1.11 01/12/2016       Scheduled Meds: . [MAR Hold] aspirin  81 mg Oral Daily  . [MAR Hold] benzonatate  100 mg Oral TID  . [MAR Hold]  ceFAZolin (ANCEF) IV  2 g Intravenous Q8H  . [MAR Hold] Chlorhexidine Gluconate Cloth  6 each Topical Q0600  . [MAR Hold] clopidogrel  75 mg Oral Daily  . [MAR Hold] cycloSPORINE  1 drop Both Eyes BID  . [MAR Hold] feeding supplement (ENSURE ENLIVE)  237 mL Oral BID BM  . [MAR Hold] furosemide  40 mg Oral Daily  . [MAR Hold] insulin aspart  0-15 Units Subcutaneous TID WC  . [MAR Hold] insulin aspart  0-5 Units Subcutaneous QHS  . [MAR Hold] insulin glargine  15 Units Subcutaneous Daily  . [MAR Hold] ipratropium-albuterol  3 mL Nebulization TID  . [MAR Hold] ketotifen  1 drop Both Eyes BID  . [MAR Hold] latanoprost  1 drop Both Eyes QHS  . [MAR Hold] metoprolol succinate  50 mg Oral Daily  . [MAR Hold] mupirocin ointment  1 application Nasal BID  . [MAR Hold] oxybutynin  5 mg Oral Daily  . [MAR Hold] pantoprazole  80 mg Oral Q1200  . [MAR Hold] rosuvastatin  20 mg Oral q1800  . [MAR Hold] vancomycin  750 mg Intravenous Q12H   Continuous Infusions: . sodium chloride 50 mL/hr at 01/12/16 1051    Principal Problem:   Sepsis ([place]) Active Problems:   CAD (coronary artery disease) of artery bypass graft   Essential hypertension   DM2 (diabetes mellitus, type 2) (HCC)   Aortic valve stenosis   COPD (chronic obstructive pulmonary disease) (HCC)   Stroke, acute, thrombotic ([place])   Acute respiratory failure ([place])   HCAP (healthcare-associated pneumonia)   CKD (chronic kidney disease)    Time spent: 45 minutes   [facility] Pager [phone]. If 7PM-7AM, please contact night-coverage at [email or web address], password  [facility] 01/12/2016, 11:50 AM  LOS: 3 days

## 2016-01-12 NOTE — Progress Notes (Signed)
Subjective: Deneis CP  Breathing OK  Objective: Filed Vitals:   01/11/16 2231 01/12/16 0053 01/12/16 0737 01/12/16 0740  BP:    136/65  Pulse: 88 82  86  Temp:    98.6 F (37 C)  TempSrc:    Oral  Resp: 24 24  28   Height:      Weight:   83.371 kg (183 lb 12.8 oz)   SpO2:  94%  100%   Weight change:   Intake/Output Summary (Last 24 hours) at 01/12/16 0815 Last data filed at 01/12/16 0602  Gross per 24 hour  Intake 1808.33 ml  Output   1300 ml  Net 508.33 ml    General: Alert, awake, oriented x3, in no acute distress Neck:  JVP is normal Heart: Regular rate and rhythm, Gr III/VI sytolic murmur LSB  No, rubs, gallops.  Lungs: Clear to auscultation anteriorly    No rales or wheezes. Exemities:  Tr  edema.   Neuro: Grossly intact, nonfocal.   Lab Results: Results for orders placed or performed during the hospital encounter of 01/09/16 (from the past 24 hour(s))  Glucose, capillary     Status: Abnormal   Collection Time: 01/11/16 12:17 PM  Result Value Ref Range   Glucose-Capillary 193 (H) 65 - 99 mg/dL  Glucose, capillary     Status: Abnormal   Collection Time: 01/11/16  6:00 PM  Result Value Ref Range   Glucose-Capillary 304 (H) 65 - 99 mg/dL  Glucose, capillary     Status: Abnormal   Collection Time: 01/11/16  9:38 PM  Result Value Ref Range   Glucose-Capillary 211 (H) 65 - 99 mg/dL  CBC     Status: Abnormal   Collection Time: 01/12/16  5:26 AM  Result Value Ref Range   WBC 9.0 4.0 - 10.5 K/uL   RBC 3.23 (L) 4.22 - 5.81 MIL/uL   Hemoglobin 8.8 (L) 13.0 - 17.0 g/dL   HCT 27.5 (L) 39.0 - 52.0 %   MCV 85.1 78.0 - 100.0 fL   MCH 27.2 26.0 - 34.0 pg   MCHC 32.0 30.0 - 36.0 g/dL   RDW 17.0 (H) 11.5 - 15.5 %   Platelets 319 150 - 400 K/uL  Comprehensive metabolic panel     Status: Abnormal   Collection Time: 01/12/16  5:26 AM  Result Value Ref Range   Sodium 139 135 - 145 mmol/L   Potassium 3.5 3.5 - 5.1 mmol/L   Chloride 104 101 - 111 mmol/L   CO2 24 22 -  32 mmol/L   Glucose, Bld 204 (H) 65 - 99 mg/dL   BUN 17 6 - 20 mg/dL   Creatinine, Ser 1.11 0.61 - 1.24 mg/dL   Calcium 9.1 8.9 - 10.3 mg/dL   Total Protein 6.2 (L) 6.5 - 8.1 g/dL   Albumin 2.3 (L) 3.5 - 5.0 g/dL   AST 24 15 - 41 U/L   ALT 22 17 - 63 U/L   Alkaline Phosphatase 61 38 - 126 U/L   Total Bilirubin 0.8 0.3 - 1.2 mg/dL   GFR calc non Af Amer 59 (L) >60 mL/min   GFR calc Af Amer >60 >60 mL/min   Anion gap 11 5 - 15  Glucose, capillary     Status: Abnormal   Collection Time: 01/12/16  8:00 AM  Result Value Ref Range   Glucose-Capillary 238 (H) 65 - 99 mg/dL    Studies/Results: No results found.  Medications: Reviewed   @PROBHOSP @  1  CAD  No sympotms to suggest  Active angina  2.  ID  Staph bacteremia  TEE sched today  ID following    3  Chronic diastolic CHF  VOlume is up some  Follow for now with planned procedure     3.  Aortic stenosis  Severe      LOS: 3 days   Dorris Carnes 01/12/2016, 8:15 AM

## 2016-01-12 NOTE — Progress Notes (Signed)
Rehab Admissions Coordinator Note:  Patient was screened by [name] for appropriateness for an Inpatient Acute Rehab Consult.  Noted PT recommending CIR.  At this time, we are recommending Inpatient Rehab consult.  Jodell Cipro M 01/12/2016, 10:24 AM  I can be reached at [phone].

## 2016-01-12 NOTE — Consult Note (Signed)
Physical Medicine and Rehabilitation Consult   Reason for Consult: Sepsis/recent CVA Referring Physician: Dr. Allyson Sabal.    HPI: Douglas Carrillo is a 82 y.o. male with history of HTN, CAD, OSA, COPD- oxygen dependent, CKD, severe AS, recent rehab stay for CVA 12/12/2015 who was admitted on 01/09/16 with sepsis secondary to HCAP and question of aspiration. He was started on antibiotics for treatment and MBS done revealing delay in swallow but no signs of aspiration. He was placed on regular diet. Blood culture done X 2 done and one positive for MRSA.  He had elevated troponins felt to be due to demand ischemia as patient without angina. Cardiology following for input and fluid overload treated with lasix prn. ID consulted for input and recommended continuing IV Vanc/Cefazolin for now and TEE for work up.   TEE without vegetations and PICC to be placed Monday if repeat BC negative and to continue 2 weeks of IV antibiotics. PT evaluation done and CIR recommended due to balance issues.     Review of Systems  Constitutional: Positive for malaise/fatigue.  HENT: Negative for hearing loss.   Eyes: Negative for blurred vision.  Respiratory: Positive for cough and shortness of breath. Negative for wheezing.   Cardiovascular: Negative for chest pain and palpitations.  Gastrointestinal: Positive for heartburn. Negative for nausea, vomiting and constipation.  Genitourinary: Negative for dysuria.  Musculoskeletal: Positive for myalgias.  Skin: Negative for itching.  Neurological: Positive for focal weakness (residual mild right sided weakness) and weakness. Negative for dizziness, speech change and headaches.  Psychiatric/Behavioral: The patient has insomnia (due to cough).       Past Medical History  Diagnosis Date  . Hypertension   . High cholesterol   . Prostate cancer ([place])   . CAD (coronary artery disease)   . OSA (obstructive sleep apnea)   . COPD (chronic obstructive pulmonary  disease) ([facility])     MODERATE  . Diabetes mellitus ([place])   . GERD (gastroesophageal reflux disease)   . Vertigo   . Arthritis     OA  . Anemia   . CHF (congestive heart failure) ([facility])     grade 2 diastolic dysfuction  . CKD (chronic kidney disease)   . Stroke (cerebrum) ([place])     11/2015  . HCAP (healthcare-associated pneumonia)     12/2015    Past Surgical History  Procedure Laterality Date  . Coronary artery bypass graft  03/29/1999    x 4:  LIMA to the LAD,SVG to the diagonal branch of the LAD,SVG to the intermediate coronary artery & SVG to the posterior descending branch of the RCA.  . Colon surgery      x 4  . Nm myocar perf wall motion  04/28/2012    Low Risk Scan  . Prostate surgery    . Cardiac catheterization    . Cardiac catheterization N/A 12/12/2015    Procedure: Right/Left Heart Cath and Coronary/Graft Angiography;  Surgeon: Troy Sine, MD;  Location: [facility];  Service: Cardiovascular;  Laterality: N/A;  . Peripheral vascular catheterization N/A 12/12/2015    Procedure: Aortic Arch Angiography;  Surgeon: Troy Sine, MD;  Location: [facility];  Service: Cardiovascular;  Laterality: N/A;  . Eye surgery      Family History  Problem Relation Age of Onset  . Kidney disease Brother   . Breast cancer Sister   . Rheum arthritis Mother     Social History:  Lives alone. Daughter  lives next door and has been providing supervision since discharge. He reports that he quit smoking about 32 years ago. His smoking use included Cigarettes. He has a 30 pack-year smoking history. He has never used smokeless tobacco. He reports that he drinks alcohol. He reports that he does not use illicit drugs.     Allergies  Allergen Reactions  . Mold Extract [Trichophyton] Anaphylaxis  . Lipitor [Atorvastatin] Other (See Comments)    Weakness in legs/myalgias    Medications Prior to Admission  Medication Sig Dispense Refill  . aspirin 81 MG tablet Take 81 mg by  mouth daily.    Marland Kitchen b complex vitamins capsule Take 1 capsule by mouth daily.    . benzonatate (TESSALON) 100 MG capsule Take 100 mg by mouth 3 (three) times daily.  1  . clopidogrel (PLAVIX) 75 MG tablet TAKE 1 TABLET DAILY 90 tablet 3  . CRESTOR 20 MG tablet Take 20 mg by mouth daily.     . furosemide (LASIX) 40 MG tablet TAKE 1 TABLET DAILY 90 tablet 0  . isosorbide mononitrate (IMDUR) 60 MG 24 hr tablet Take 0.5 tablets (30 mg total) by mouth daily. 135 tablet 0  . ketotifen (THERA TEARS ALLERGY) 0.025 % ophthalmic solution Place 1 drop into both eyes 2 (two) times daily.     Marland Kitchen latanoprost (XALATAN) 0.005 % ophthalmic solution Place 1 drop into both eyes at bedtime.    . metoprolol succinate (TOPROL-XL) 100 MG 24 hr tablet Take 1/2 tablet daily  0  . Multiple Vitamins-Minerals (CENTRUM SILVER ULTRA MENS PO) Take 1 tablet by mouth daily.    Marland Kitchen NEXIUM 40 MG capsule Take 40 mg by mouth daily.     Marland Kitchen oxybutynin (DITROPAN-XL) 5 MG 24 hr tablet Take 5 mg by mouth daily.     . pioglitazone (ACTOS) 45 MG tablet Take 45 mg by mouth daily.    . potassium chloride SA (KLOR-CON M20) 20 MEQ tablet Take 3 tablets daily. (Patient taking differently: Take 20 mEq by mouth 3 (three) times daily. Take 3 tablets daily.) 270 tablet 0  . predniSONE (DELTASONE) 10 MG tablet Take 10 mg by mouth taper from 4 doses each day to 1 dose and stop. Last dose today 01/09/16 (10mg )    . PROAIR HFA 108 (90 BASE) MCG/ACT inhaler Inhale into the lungs as needed.    . RESTASIS 0.05 % ophthalmic emulsion Place 1 drop into both eyes 2 (two) times daily.    Marland Kitchen STIOLTO RESPIMAT 2.5-2.5 MCG/ACT AERS USE 2 INHALATIONS ORALLY   DAILY 12 g 3  . VICTOZA 18 MG/3ML SOPN Inject 1.8 mg as directed every morning.     . vitamin C (ASCORBIC ACID) 500 MG tablet Take 500 mg by mouth 2 (two) times daily.    . vitamin E 400 UNIT capsule Take 400 Units by mouth daily.    Marland Kitchen ZETIA 10 MG tablet Take 10 mg by mouth daily.    . B-D ULTRAFINE III SHORT PEN  31G X 8 MM MISC     . insulin aspart (NOVOLOG) 100 UNIT/ML injection Inject 0-9 Units into the skin 3 (three) times daily with meals. (Patient not taking: Reported on 01/08/2016) 10 mL 11  . ONETOUCH DELICA LANCETS 99991111 MISC 1 each by Other route 2 (two) times daily. Use 1 lancet each to check glucose bid    . ONETOUCH VERIO test strip 1 strip by Other route 2 (two) times daily. Use 1 strip to check glucose bid    .  Spacer/Aero-Holding Chambers (AEROCHAMBER PLUS FLO-VU) MISC   1  . Vitamin D, Ergocalciferol, (DRISDOL) 50000 UNITS CAPS capsule Take 50,000 Units by mouth every 14 (fourteen) days. Every other week on Sunday      Home: [place] expects to be discharged to:: Private residence Living Arrangements: Children (since return home after CVA) Available Help at Discharge: Available 24 hours/day, Family Type of Home: House Home Access: Stairs to enter Technical brewer of Steps: 2 Entrance Stairs-Rails: None Home Layout: One level Bathroom Shower/Tub: Tub/shower unit, Architectural technologist: Standard Home Equipment: Environmental consultant - 2 wheels, [place] - single point  Functional History: Prior Function Level of Independence: Needs assistance Gait / Transfers Assistance Needed: Assist with amb with rolling walker since CVA Comments: Prior to CVA pt independent active and driving Functional Status:  Mobility: Bed Mobility Overal bed mobility: Needs Assistance Bed Mobility: Sidelying to Sit Sidelying to sit: Mod assist General bed mobility comments: Assist to elevate trunk and bring hips to EOB Transfers Overall transfer level: Needs assistance Equipment used: Rolling walker (2 wheeled) Transfers: Sit to/from Stand Sit to Stand: Mod assist, +2 safety/equipment General transfer comment: Assist to bring trunk up and for balance Ambulation/Gait Ambulation/Gait assistance: Min assist, +2 safety/equipment Ambulation Distance (Feet): 10 Feet Assistive device: Rolling  walker (2 wheeled) Gait Pattern/deviations: Step-through pattern, Decreased step length - right, Decreased step length - left, Shuffle, Narrow base of support General Gait Details: Assist for balancen Gait velocity: decr Gait velocity interpretation: <1.8 ft/sec, indicative of risk for recurrent falls    ADL:    Cognition: Cognition Overall Cognitive Status: Within Functional Limits for tasks assessed Orientation Level: Oriented X4 Cognition Arousal/Alertness: Awake/alert Behavior During Therapy: WFL for tasks assessed/performed Overall Cognitive Status: Within Functional Limits for tasks assessed  Blood pressure 162/73, pulse 94, temperature 100 F (37.8 C), temperature source Oral, resp. rate 25, height 6' (1.829 m), weight 83.008 kg (183 lb), SpO2 95 %. Physical Exam  Nursing note and vitals reviewed. Constitutional: He is oriented to person, place, and time. He appears well-developed and well-nourished. No distress.  HENT:  Head: Normocephalic and atraumatic.  Eyes: Conjunctivae and EOM are normal. Pupils are equal, round, and reactive to light.  Neck: Normal range of motion. Neck supple.  Cardiovascular: Normal rate and regular rhythm.   Murmur heard. Respiratory: Effort normal. He has no wheezes. He has rales in the right middle field and the right lower field.  GI: He exhibits no distension. There is no tenderness.  Musculoskeletal: He exhibits no edema or tenderness.  Neurological: He is alert and oriented to person, place, and time.  Speech clear. Follows commands without difficulty. Mild right sided weakness persists. RUE 4 to 4+/5. RLE: 2/5 HF, 2/5 KE and 2- ADF/PF. LUE 4+ LLE 3/5 HF and KE and 4/5 ADF/PF. Left sided sensory loss 1/2? Cognitively intact  Skin: Skin is warm and dry. He is not diaphoretic.  Psychiatric: He has a normal mood and affect. His behavior is normal. Judgment and thought content normal.    Results for orders placed or performed during the  hospital encounter of 01/09/16 (from the past 24 hour(s))  Glucose, capillary     Status: Abnormal   Collection Time: 01/11/16 12:17 PM  Result Value Ref Range   Glucose-Capillary 193 (H) 65 - 99 mg/dL  Glucose, capillary     Status: Abnormal   Collection Time: 01/11/16  6:00 PM  Result Value Ref Range   Glucose-Capillary 304 (H) 65 - 99 mg/dL  Glucose, capillary     Status: Abnormal   Collection Time: 01/11/16  9:38 PM  Result Value Ref Range   Glucose-Capillary 211 (H) 65 - 99 mg/dL  CBC     Status: Abnormal   Collection Time: 01/12/16  5:26 AM  Result Value Ref Range   WBC 9.0 4.0 - 10.5 K/uL   RBC 3.23 (L) 4.22 - 5.81 MIL/uL   Hemoglobin 8.8 (L) 13.0 - 17.0 g/dL   HCT 27.5 (L) 39.0 - 52.0 %   MCV 85.1 78.0 - 100.0 fL   MCH 27.2 26.0 - 34.0 pg   MCHC 32.0 30.0 - 36.0 g/dL   RDW 17.0 (H) 11.5 - 15.5 %   Platelets 319 150 - 400 K/uL  Comprehensive metabolic panel     Status: Abnormal   Collection Time: 01/12/16  5:26 AM  Result Value Ref Range   Sodium 139 135 - 145 mmol/L   Potassium 3.5 3.5 - 5.1 mmol/L   Chloride 104 101 - 111 mmol/L   CO2 24 22 - 32 mmol/L   Glucose, Bld 204 (H) 65 - 99 mg/dL   BUN 17 6 - 20 mg/dL   Creatinine, Ser 1.11 0.61 - 1.24 mg/dL   Calcium 9.1 8.9 - 10.3 mg/dL   Total Protein 6.2 (L) 6.5 - 8.1 g/dL   Albumin 2.3 (L) 3.5 - 5.0 g/dL   AST 24 15 - 41 U/L   ALT 22 17 - 63 U/L   Alkaline Phosphatase 61 38 - 126 U/L   Total Bilirubin 0.8 0.3 - 1.2 mg/dL   GFR calc non Af Amer 59 (L) >60 mL/min   GFR calc Af Amer >60 >60 mL/min   Anion gap 11 5 - 15  Glucose, capillary     Status: Abnormal   Collection Time: 01/12/16  8:00 AM  Result Value Ref Range   Glucose-Capillary 238 (H) 65 - 99 mg/dL   No results found.  Assessment/Plan: Diagnosis: debility after pneumonia/repsiratory failure in the setting of baseline right hemiparesis due to CVA 1. Does the need for close, 24 hr/day medical supervision in concert with the patient's rehab needs make  it unreasonable for this patient to be served in a less intensive setting? Yes 2. Co-Morbidities requiring supervision/potential complications: cad, ckd, dm2, htn 3. Due to bladder management, bowel management, safety, skin/wound care, disease management, medication administration, pain management and patient education, does the patient require 24 hr/day rehab nursing? Yes 4. Does the patient require coordinated care of a physician, rehab nurse, PT (1-2 hrs/day, 5 days/week) and OT (1-2 hrs/day, 5 days/week) to address physical and functional deficits in the context of the above medical diagnosis(es)? Yes Addressing deficits in the following areas: balance, endurance, locomotion, strength, transferring, bowel/bladder control, bathing, dressing, feeding, grooming and psychosocial support 5. Can the patient actively participate in an intensive therapy program of at least 3 hrs of therapy per day at least 5 days per week? Yes 6. The potential for patient to make measurable gains while on inpatient rehab is excellent 7. Anticipated functional outcomes upon discharge from inpatient rehab are modified independent and supervision  with PT, modified independent and supervision to occasional min assist with OT, n/a with SLP. 8. Estimated rehab length of stay to reach the above functional goals is: 7-10 days 9. Does the patient have adequate social supports and living environment to accommodate these discharge functional goals? Yes 10. Anticipated D/C setting: Home 11. Anticipated post D/C treatments: HH therapy and Outpatient therapy 12.  Overall Rehab/Functional Prognosis: excellent  RECOMMENDATIONS: This patient's condition is appropriate for continued rehabilitative care in the following setting: CIR Patient has agreed to participate in recommended program. Yes Note that insurance prior authorization may be required for reimbursement for recommended care.  Comment: Rehab Admissions Coordinator to follow  up.  Thanks,  Meredith Staggers, MD, Mellody Drown     01/12/2016

## 2016-01-12 NOTE — H&P (View-Only) (Signed)
Primary Physician: Primary Cardiologist:  Claiborne Billings    HPI:Pt is an 82 yo who is followed by [name]  Hx of CAD (s/p CABG in 2000  LIMA to the LAD, vein to the diagonal, vein to the intermediate, and vein to the PDA). In January 2007 cardiac catheterization was done due to exertional dyspnea and mild abnormality on nuclear imaging. This revealed patent vein grafts to the diagonal, marginal, and PDA, but he had an atretic LIMA graft to the LAD with evidence for 60% narrowing in the proximal left subclavian. He also developed moderate aortic stenosis. A nuclear perfusion study in May 2013 showed normal perfusion. Echo in 2015 Normal LVEF Mean gradient across AV 27 mm Hg  Very mild M Cath on December 12 2015 Showed Ost LM lesion, 20% stenosed.  1st Diag lesion, 100% stenosed.  Lat Ramus lesion, 80% stenosed.  Ost RCA to Prox RCA lesion, 95% stenosed.  Prox RCA lesion, 80% stenosed.  SVG was injected is normal in caliber, and is anatomically normal.  RPDA lesion, 20% stenosed.  Ost LAD to Prox LAD lesion, 50% stenosed.  was injected is normal in caliber, and is anatomically normal.  SVG was injected is normal in caliber, and is anatomically normal.  LIMA was injected is normal in caliber, and is anatomically normal.  R heart cath showed mod to severe pulm HTN  PAP 70/22.  Mean gradient across AV of 30 mm Hg  LVEF 30 to 35%  Plan to refer to surgery for possible TAVR    He returned to hosp with CVA on 12/21  (L brainstem; felt embolic)  Underwent rehab at [facility]  Discharged on 1/6 After D/C pt reported cnotinued coughing  Pt says cough bad the day before yesterday   Rx steroids as outpt.  Also fever  No CP    Initial trop 0.07  Increased to peak 2.98  Now 2.21    ON 1/17 admitted for  SOB, cough and fever     ABX staerted     Currently CP free   Breathing is short at times  Still some cough      Past Medical History  Diagnosis Date  . Hypertension   . High cholesterol     . Prostate cancer ([place])   . CAD (coronary artery disease)   . OSA (obstructive sleep apnea)   . COPD (chronic obstructive pulmonary disease) (HCC)     MODERATE  . Diabetes mellitus ([place])   . GERD (gastroesophageal reflux disease)   . Vertigo   . Arthritis     OA  . Anemia   . CHF (congestive heart failure) (HCC)     grade 2 diastolic dysfuction  . CKD (chronic kidney disease)   . 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 HCAP (healthcare-associated pneumonia)     12/2015    Medications Prior to Admission  Medication Sig Dispense Refill  . aspirin 81 MG tablet Take 81 mg by mouth daily.    Marland Kitchen b complex vitamins capsule Take 1 capsule by mouth daily.    . benzonatate (TESSALON) 100 MG capsule Take 100 mg by mouth 3 (three) times daily.  1  . clopidogrel (PLAVIX) 75 MG tablet TAKE 1 TABLET DAILY 90 tablet 3  . CRESTOR 20 MG tablet Take 20 mg by mouth daily.     . furosemide (LASIX) 40 MG tablet TAKE 1 TABLET DAILY 90 tablet 0  . isosorbide mononitrate (IMDUR) 60 MG  24 hr tablet Take 0.5 tablets (30 mg total) by mouth daily. 135 tablet 0  . ketotifen (THERA TEARS ALLERGY) 0.025 % ophthalmic solution Place 1 drop into both eyes 2 (two) times daily.     Marland Kitchen latanoprost (XALATAN) 0.005 % ophthalmic solution Place 1 drop into both eyes at bedtime.    . metoprolol succinate (TOPROL-XL) 100 MG 24 hr tablet Take 1/2 tablet daily  0  . Multiple Vitamins-Minerals (CENTRUM SILVER ULTRA MENS PO) Take 1 tablet by mouth daily.    Marland Kitchen NEXIUM 40 MG capsule Take 40 mg by mouth daily.     Marland Kitchen oxybutynin (DITROPAN-XL) 5 MG 24 hr tablet Take 5 mg by mouth daily.     . pioglitazone (ACTOS) 45 MG tablet Take 45 mg by mouth daily.    . potassium chloride SA (KLOR-CON M20) 20 MEQ tablet Take 3 tablets daily. (Patient taking differently: Take 20 mEq by mouth 3 (three) times daily. Take 3 tablets daily.) 270 tablet 0  . predniSONE (DELTASONE) 10 MG tablet Take 10 mg by mouth taper from 4 doses each day to 1 dose and  stop. Last dose today 01/09/16 (10mg )    . PROAIR HFA 108 (90 BASE) MCG/ACT inhaler Inhale into the lungs as needed.    . RESTASIS 0.05 % ophthalmic emulsion Place 1 drop into both eyes 2 (two) times daily.    Marland Kitchen STIOLTO RESPIMAT 2.5-2.5 MCG/ACT AERS USE 2 INHALATIONS ORALLY   DAILY 12 g 3  . VICTOZA 18 MG/3ML SOPN Inject 1.8 mg as directed every morning.     . vitamin C (ASCORBIC ACID) 500 MG tablet Take 500 mg by mouth 2 (two) times daily.    . vitamin E 400 UNIT capsule Take 400 Units by mouth daily.    Marland Kitchen ZETIA 10 MG tablet Take 10 mg by mouth daily.    . B-D ULTRAFINE III SHORT PEN 31G X 8 MM MISC     . insulin aspart (NOVOLOG) 100 UNIT/ML injection Inject 0-9 Units into the skin 3 (three) times daily with meals. (Patient not taking: Reported on 01/08/2016) 10 mL 11  . ONETOUCH DELICA LANCETS 99991111 MISC 1 each by Other route 2 (two) times daily. Use 1 lancet each to check glucose bid    . ONETOUCH VERIO test strip 1 strip by Other route 2 (two) times daily. Use 1 strip to check glucose bid    . Spacer/Aero-Holding Chambers (AEROCHAMBER PLUS FLO-VU) MISC   1  . Vitamin D, Ergocalciferol, (DRISDOL) 50000 UNITS CAPS capsule Take 50,000 Units by mouth every 14 (fourteen) days. Every other week on Sunday       . aspirin  81 mg Oral Daily  . benzonatate  100 mg Oral TID  . ceFEPime (MAXIPIME) IV  2 g Intravenous Q24H  . Chlorhexidine Gluconate Cloth  6 each Topical Q0600  . clopidogrel  75 mg Oral Daily  . cycloSPORINE  1 drop Both Eyes BID  . insulin aspart  0-15 Units Subcutaneous TID WC  . insulin aspart  0-5 Units Subcutaneous QHS  . insulin glargine  15 Units Subcutaneous Daily  . ipratropium-albuterol  3 mL Nebulization TID  . ketotifen  1 drop Both Eyes BID  . latanoprost  1 drop Both Eyes QHS  . metoprolol succinate  50 mg Oral Daily  . mupirocin ointment  1 application Nasal BID  . oxybutynin  5 mg Oral Daily  . pantoprazole  80 mg Oral Q1200  . rosuvastatin  20 mg  Oral q1800  .  vancomycin  1,250 mg Intravenous Q24H    Infusions: . sodium chloride Stopped (01/09/16 1900)    Allergies  Allergen Reactions  . Mold Extract [Trichophyton] Anaphylaxis  . Lipitor [Atorvastatin] Other (See Comments)    Weakness in legs/myalgias    Social History   Social History  . Marital Status: Widowed    Spouse Name: N/A  . Number of Children: N/A  . Years of Education: N/A   Occupational History  . retired    Social History Main Topics  . Smoking status: Former Smoker -- 1.00 packs/day for 30 years    Types: Cigarettes    Quit date: 12/24/1983  . Smokeless tobacco: Never Used     Comment: quit smoking in 1985  . Alcohol Use: 0.0 oz/week    0 Standard drinks or equivalent per week     Comment: rarely  . Drug Use: No  . Sexual Activity: Not on file   Other Topics Concern  . Not on file   Social History Narrative    Family History  Problem Relation Age of Onset  . Kidney disease Brother   . Breast cancer Sister   . Rheum arthritis Mother     REVIEW OF SYSTEMS:  All systems reviewed  Negative to the above problem except as noted above.    PHYSICAL EXAM: Filed Vitals:   01/10/16 0347 01/10/16 0741  BP: 136/69 151/82  Pulse: 91   Temp:  98.3 F (36.8 C)  Resp: 23      Intake/Output Summary (Last 24 hours) at 01/10/16 1133 Last data filed at 01/10/16 0240  Gross per 24 hour  Intake 439.17 ml  Output   1300 ml  Net -860.83 ml    General: Thin  82 yo No respiratory difficulty HEENT: normal Neck: supple. no JVD. Carotids 2+ bilat; no bruits. No lymphadenopathy or thryomegaly appreciated. Cor: PMI nondisplaced. Regular rate & rhythm. Gr III/ VI systolid murmur LSB   Lungs: rhonchi and pops at bases L greater than R   Abdomen: soft, nontender, nondistended. No hepatosplenomegaly. No bruits or masses. Good bowel sounds. Extremities: no cyanosis, clubbing, rash, edema Neuro: alert & oriented x 3, cranial nerves grossly intact. moves all 4  extremities w/o difficulty. Affect pleasant.  ECG:  SR 89 bpm  Occasional PVC    RBBB LAFB  LVH   RVH  ST T wave changes consistent with strain pattern    Results for orders placed or performed during the hospital encounter of 01/09/16 (from the past 24 hour(s))  Urinalysis, Routine w reflex microscopic (not at [facility])     Status: Abnormal   Collection Time: 01/09/16 11:46 AM  Result Value Ref Range   Color, Urine YELLOW YELLOW   APPearance CLEAR CLEAR   Specific Gravity, Urine 1.028 1.005 - 1.030   pH 5.5 5.0 - 8.0   Glucose, UA >1000 (A) NEGATIVE mg/dL   Hgb urine dipstick NEGATIVE NEGATIVE   Bilirubin Urine NEGATIVE NEGATIVE   Ketones, ur 15 (A) NEGATIVE mg/dL   Protein, ur 30 (A) NEGATIVE mg/dL   Nitrite NEGATIVE NEGATIVE   Leukocytes, UA NEGATIVE NEGATIVE  Urine culture     Status: None (Preliminary result)   Collection Time: 01/09/16 11:46 AM  Result Value Ref Range   Specimen Description URINE, CATHETERIZED    Special Requests NONE    Culture NO GROWTH < 24 HOURS    Report Status PENDING   Urine microscopic-add on     Status:  None   Collection Time: 01/09/16 11:46 AM  Result Value Ref Range   Squamous Epithelial / LPF NONE SEEN NONE SEEN   WBC, UA 0-5 0 - 5 WBC/hpf   RBC / HPF NONE SEEN 0 - 5 RBC/hpf   Bacteria, UA NONE SEEN NONE SEEN  MRSA PCR Screening     Status: Abnormal   Collection Time: 01/09/16  3:01 PM  Result Value Ref Range   MRSA by PCR POSITIVE (A) NEGATIVE  Glucose, capillary     Status: Abnormal   Collection Time: 01/09/16  5:29 PM  Result Value Ref Range   Glucose-Capillary 450 (H) 65 - 99 mg/dL  Troponin I (q 6hr x 3)     Status: Abnormal   Collection Time: 01/09/16  7:26 PM  Result Value Ref Range   Troponin I 1.86 (HH) <0.031 ng/mL  Procalcitonin     Status: None   Collection Time: 01/09/16  7:26 PM  Result Value Ref Range   Procalcitonin 5.41 ng/mL  Protime-INR     Status: Abnormal   Collection Time: 01/09/16  7:26 PM  Result Value Ref Range    Prothrombin Time 18.0 (H) 11.6 - 15.2 seconds   INR 1.48 0.00 - 1.49  APTT     Status: None   Collection Time: 01/09/16  7:26 PM  Result Value Ref Range   aPTT 24 24 - 37 seconds  Glucose, capillary     Status: Abnormal   Collection Time: 01/09/16  7:46 PM  Result Value Ref Range   Glucose-Capillary 397 (H) 65 - 99 mg/dL  Glucose, capillary     Status: Abnormal   Collection Time: 01/09/16  9:32 PM  Result Value Ref Range   Glucose-Capillary 337 (H) 65 - 99 mg/dL  Glucose, capillary     Status: Abnormal   Collection Time: 01/09/16 11:24 PM  Result Value Ref Range   Glucose-Capillary 314 (H) 65 - 99 mg/dL  Troponin I (q 6hr x 3)     Status: Abnormal   Collection Time: 01/10/16  1:27 AM  Result Value Ref Range   Troponin I 2.98 (HH) <0.031 ng/mL  Comprehensive metabolic panel     Status: Abnormal   Collection Time: 01/10/16  1:27 AM  Result Value Ref Range   Sodium 137 135 - 145 mmol/L   Potassium 4.0 3.5 - 5.1 mmol/L   Chloride 107 101 - 111 mmol/L   CO2 20 (L) 22 - 32 mmol/L   Glucose, Bld 351 (H) 65 - 99 mg/dL   BUN 23 (H) 6 - 20 mg/dL   Creatinine, Ser 1.20 0.61 - 1.24 mg/dL   Calcium 9.2 8.9 - 10.3 mg/dL   Total Protein 6.6 6.5 - 8.1 g/dL   Albumin 2.4 (L) 3.5 - 5.0 g/dL   AST 26 15 - 41 U/L   ALT 18 17 - 63 U/L   Alkaline Phosphatase 60 38 - 126 U/L   Total Bilirubin 0.6 0.3 - 1.2 mg/dL   GFR calc non Af Amer 53 (L) >60 mL/min   GFR calc Af Amer >60 >60 mL/min   Anion gap 10 5 - 15  CBC     Status: Abnormal   Collection Time: 01/10/16  1:27 AM  Result Value Ref Range   WBC 9.9 4.0 - 10.5 K/uL   RBC 3.02 (L) 4.22 - 5.81 MIL/uL   Hemoglobin 8.2 (L) 13.0 - 17.0 g/dL   HCT 25.7 (L) 39.0 - 52.0 %   MCV 85.1 78.0 -  100.0 fL   MCH 27.2 26.0 - 34.0 pg   MCHC 31.9 30.0 - 36.0 g/dL   RDW 17.0 (H) 11.5 - 15.5 %   Platelets 262 150 - 400 K/uL  Glucose, capillary     Status: Abnormal   Collection Time: 01/10/16  2:03 AM  Result Value Ref Range   Glucose-Capillary 294  (H) 65 - 99 mg/dL  Glucose, capillary     Status: Abnormal   Collection Time: 01/10/16  7:33 AM  Result Value Ref Range   Glucose-Capillary 281 (H) 65 - 99 mg/dL  Troponin I     Status: Abnormal   Collection Time: 01/10/16  7:55 AM  Result Value Ref Range   Troponin I 2.21 (HH) <0.031 ng/mL   Dg Chest 2 View  01/09/2016  CLINICAL DATA:  Cough and shortness of breath for a week. EXAM: CHEST  2 VIEW COMPARISON:  January 04, 2016. FINDINGS: Stable cardiomegaly. Status post coronary artery bypass graft. No pneumothorax or significant pleural effusion is noted. There is interval development of right middle and lower lobe airspace opacities concerning for pneumonia. Left lung is clear. Bony thorax is unremarkable. IMPRESSION: Findings consistent with right middle and lower lobe pneumonia. Continued radiographic follow-up is recommended to ensure resolution. Electronically Signed   By: Marijo Conception, M.D.   On: 01/09/2016 08:08   Dg Swallowing Func-speech Pathology  01/10/2016  Objective Swallowing Evaluation:   Patient Details Name: Thedore Jeanmarie MRN: [id number] Date of Birth: 27-Aug-1928 Today's Date: 01/10/2016 Time: SLP Start Time (ACUTE ONLY): 0930-SLP Stop Time (ACUTE ONLY): 0945 SLP Time Calculation (min) (ACUTE ONLY): 15 min Past Medical History: Past Medical History Diagnosis Date . Hypertension  . High cholesterol  . Prostate cancer ([place])  . CAD (coronary artery disease)  . OSA (obstructive sleep apnea)  . COPD (chronic obstructive pulmonary disease) (HCC)    MODERATE . Diabetes mellitus ([place])  . GERD (gastroesophageal reflux disease)  . Vertigo  . Arthritis    OA . Anemia  . CHF (congestive heart failure) (HCC)    grade 2 diastolic dysfuction . CKD (chronic kidney disease)  . Stroke (cerebrum) ([place])    11/2015 . HCAP (healthcare-associated pneumonia)    12/2015 Past Surgical History: Past Surgical History Procedure Laterality Date . Coronary artery bypass graft  03/29/1999   x 4:  LIMA to the LAD,SVG  to the diagonal branch of the LAD,SVG to the intermediate coronary artery & SVG to the posterior descending branch of the RCA. . Colon surgery     x 4 . Nm myocar perf wall motion  04/28/2012   Low Risk Scan . Prostate surgery   . Cardiac catheterization   . Cardiac catheterization N/A 12/12/2015   Procedure: Right/Left Heart Cath and Coronary/Graft Angiography;  Surgeon: Troy Sine, MD;  Location: [place];  Service: Cardiovascular;  Laterality: N/A; . Peripheral vascular catheterization N/A 12/12/2015   Procedure: Aortic Arch Angiography;  Surgeon: Troy Sine, MD;  Location: [place];  Service: Cardiovascular;  Laterality: N/A; . Eye surgery   HPI: 82 y.o. 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 He is admitted for sepsis secondary to PNA (RML and RLL per imaging report). Subjective: pt alert, denies any difficulties with swallowing Assessment / Plan / Recommendation CHL IP CLINICAL IMPRESSIONS 01/10/2016 Therapy Diagnosis Mild oral phase dysphagia;Suspected primary esophageal dysphagia Clinical Impression Pt demonstrates no significant oropharyngeal dysphagia worrisome for  aspiration risk. There is slow bolus formation with occasional premature spillage to valleculae. Initial sips of nectar showed a delay in swallow initiation, but as study progressed pt was taking consecutive sips of thin liquids with timely swallow. Strength WNL, no penetration or aspiration occurred despite attempt to tax system. Pt did have some delayed coughing; esophageal sweep showed appearence of significant stasis with purees which did not clear with sips of liquid. Pill transited with multiple bites and sips. Discussed esophageal precautions. Recommend regular diet and thin liquids. SLP will f/u for education and tolerance at bedside.  Impact on safety and function Moderate aspiration risk   CHL IP TREATMENT RECOMMENDATION 01/10/2016 Treatment  Recommendations Therapy as outlined in treatment plan below   No flowsheet data found. CHL IP DIET RECOMMENDATION 01/10/2016 SLP Diet Recommendations Regular solids;Thin liquid Liquid Administration via Cup;Straw Medication Administration Whole meds with puree Compensations Slow rate;Small sips/bites;Follow solids with liquid Postural Changes Remain semi-upright after after feeds/meals (Comment);Seated upright at 90 degrees   CHL IP OTHER RECOMMENDATIONS 01/10/2016 Recommended Consults -- Oral Care Recommendations Patient independent with oral care Other Recommendations --   CHL IP FOLLOW UP RECOMMENDATIONS 01/10/2016 Follow up Recommendations None   CHL IP FREQUENCY AND DURATION 01/10/2016 Speech Therapy Frequency (ACUTE ONLY) min 1 x/week Treatment Duration 1 week      CHL IP ORAL PHASE 01/10/2016 Oral Phase Impaired Oral - Pudding Teaspoon -- Oral - Pudding Cup -- Oral - Honey Teaspoon -- Oral - Honey Cup -- Oral - Nectar Teaspoon -- Oral - Nectar Cup Delayed oral transit;Premature spillage Oral - Nectar Straw Delayed oral transit;Premature spillage Oral - Thin Teaspoon -- Oral - Thin Cup Premature spillage Oral - Thin Straw Premature spillage Oral - Puree WFL Oral - Mech Soft -- Oral - Regular WFL Oral - Multi-Consistency -- Oral - Pill -- Oral Phase - Comment --  CHL IP PHARYNGEAL PHASE 01/10/2016 Pharyngeal Phase Impaired Pharyngeal- Pudding Teaspoon -- Pharyngeal -- Pharyngeal- Pudding Cup -- Pharyngeal -- Pharyngeal- Honey Teaspoon -- Pharyngeal -- Pharyngeal- Honey Cup -- Pharyngeal -- Pharyngeal- Nectar Teaspoon -- Pharyngeal -- Pharyngeal- Nectar Cup Delayed swallow initiation-vallecula Pharyngeal -- Pharyngeal- Nectar Straw Delayed swallow initiation-pyriform sinuses Pharyngeal -- Pharyngeal- Thin Teaspoon -- Pharyngeal -- Pharyngeal- Thin Cup WFL Pharyngeal -- Pharyngeal- Thin Straw WFL Pharyngeal -- Pharyngeal- Puree WFL Pharyngeal -- Pharyngeal- Mechanical Soft -- Pharyngeal -- Pharyngeal- Regular WFL  Pharyngeal -- Pharyngeal- Multi-consistency -- Pharyngeal -- Pharyngeal- Pill WFL Pharyngeal -- Pharyngeal Comment --  CHL IP CERVICAL ESOPHAGEAL PHASE 01/10/2016 Cervical Esophageal Phase Impaired Pudding Teaspoon -- Pudding Cup -- Honey Teaspoon -- Honey Cup -- Nectar Teaspoon -- Nectar Cup -- Nectar Straw -- Thin Teaspoon -- Thin Cup -- Thin Straw -- Puree -- Mechanical Soft -- Regular -- Multi-consistency -- Pill -- Cervical Esophageal Comment -- CHL IP GO 12/14/2015 Functional Assessment Tool Used skilled clinical judgement Functional Limitations Motor speech Swallow Current Status [phone]) (None) Swallow Goal Status [id number]) (None) Swallow Discharge Status [id number]) (None) Motor Speech Current Status [id number]) [place] Motor Speech Goal Status [id number]) [place] Motor Speech Goal Status [id number]) [place] Spoken Language Comprehension Current Status [id number]) (None) Spoken Language Comprehension Goal Status [id number]) (None) Spoken Language Comprehension Discharge Status [id number]) (None) Spoken Language Expression Current Status [id number]) (None) Spoken Language Expression Goal Status [id number]) (None) Spoken Language Expression Discharge Status [id number]) (None) Attention Current Status [id number]) (None) Attention Goal Status [id number]) (None) Attention Discharge Status [id number]) (None) Memory Current Status [id number]) (None) Memory Goal Status [id number]) (None) Memory Discharge Status [id number]) (None)  Voice Current Status [id number]) (None) Voice Goal Status [id number]) (None) Voice Discharge Status [phone]) (None) Other Speech-Language Pathology Functional Limitation [phone]) (None) Other Speech-Language Pathology Functional Limitation Goal Status [phone]) (None) Other Speech-Language Pathology Functional Limitation Discharge Status [phone]) (None) Herbie Baltimore, MA CCC-SLP [phone] [place], Katherene Ponto 01/10/2016, 10:15 AM                ASSESSMENT: 82 yo with hx of CAD and mod to severe AS  Recent cath showed patent grafts  Unfort complicated by CVA    Admitted  yesterday with fever, cough  Found to have pneumonia    Pt denies having CP  Has had cough  Exam signif for abnormal lung exam consistent with PNA  Murmur consistent with AS.  No signif edema    EKG is nondiagnostic    Labs signif for  Hypoxia on admit, Lactic acid 2.54  Trop was o.o7, 1.86, 2.9 adn 2.21  Troponin most likely reflects demand ischemia in setting of known CAD, AS with infection/sepsis  They are trending down.    Pt is pain free  I would continue suppportive care   I have cancelled echo  He has had recent eval of LV function and AV   Watch volume     2  Aortic stenosis   Watch BP  I/Os    3.  ID  Per primary team    4  Neuro  Recovering from CVA in December     Will continue to follow   Dorris Carnes

## 2016-01-12 NOTE — Progress Notes (Signed)
Physical Therapy Treatment Patient Details Name: Maddox Marchesano MRN: [id number] DOB: 1928/07/27 Today's Date: January 16, 2016    History of Present Illness Pt adm with PNA. PMH - recent lt CVA, HTN, DM, CAD, CABG,     PT Comments    Pt making steady progress. Pt continues to fatigue quickly and residual rt side weakness from recent stroke becomes more pronounced as he fatigues.  Follow Up Recommendations  CIR     Equipment Recommendations  None recommended by PT    Recommendations for Other Services       Precautions / Restrictions Precautions Precautions: Fall Restrictions Weight Bearing Restrictions: No    Mobility  Bed Mobility Overal bed mobility: Needs Assistance Bed Mobility: Sidelying to Sit   Sidelying to sit: Mod assist       General bed mobility comments: Assist to elevate trunk and bring hips to EOB  Transfers Overall transfer level: Needs assistance Equipment used: Rolling walker (2 wheeled) Transfers: Sit to/from Stand Sit to Stand: Mod assist;+2 safety/equipment         General transfer comment: Assist to bring hips up and for balance.  Ambulation/Gait Ambulation/Gait assistance: Min assist;+2 safety/equipment Ambulation Distance (Feet): 90 Feet Assistive device: Rolling walker (2 wheeled) Gait Pattern/deviations: Step-through pattern;Decreased step length - right;Decreased dorsiflexion - right;Narrow base of support Gait velocity: decr Gait velocity interpretation: <1.8 ft/sec, indicative of risk for recurrent falls General Gait Details: Pt with rt foot dragging as distance incr and pt fatigued. Verbal cues to stay closer to walker especially with turns   Stairs            Wheelchair Mobility    Modified Rankin (Stroke Patients Only)       Balance Overall balance assessment: Needs assistance Sitting-balance support: No upper extremity supported;Feet supported Sitting balance-Leahy Scale: Good     Standing balance support:  Bilateral upper extremity supported Standing balance-Leahy Scale: Poor Standing balance comment: walker and min A for static standing                    Cognition Arousal/Alertness: Awake/alert Behavior During Therapy: WFL for tasks assessed/performed Overall Cognitive Status: Within Functional Limits for tasks assessed                      Exercises      General Comments        Pertinent Vitals/Pain Pain Assessment: No/denies pain    Home Living                      Prior Function            PT Goals (current goals can now be found in the care plan section) Progress towards PT goals: Progressing toward goals    Frequency  Min 3X/week    PT Plan Current plan remains appropriate    Co-evaluation             End of Session Equipment Utilized During Treatment: Gait belt;Oxygen Activity Tolerance: Patient limited by fatigue Patient left: in chair;with call bell/phone within reach;with chair alarm set;with nursing/sitter in room     Time: 1034-1056 PT Time Calculation (min) (ACUTE ONLY): 22 min  Charges:  $Gait Training: 8-22 mins                    G Codes:      Nohelani Benning 01/16/2016, 1:39 PM [employer] PT [phone]

## 2016-01-13 LAB — GLUCOSE, CAPILLARY
GLUCOSE-CAPILLARY: 159 mg/dL — AB (ref 65–99)
GLUCOSE-CAPILLARY: 230 mg/dL — AB (ref 65–99)
Glucose-Capillary: 172 mg/dL — ABNORMAL HIGH (ref 65–99)
Glucose-Capillary: 192 mg/dL — ABNORMAL HIGH (ref 65–99)

## 2016-01-13 LAB — VANCOMYCIN, TROUGH: Vancomycin Tr: 14 ug/mL (ref 10.0–20.0)

## 2016-01-13 LAB — COMPREHENSIVE METABOLIC PANEL
ALBUMIN: 2 g/dL — AB (ref 3.5–5.0)
ALK PHOS: 57 U/L (ref 38–126)
ALT: 17 U/L (ref 17–63)
AST: 22 U/L (ref 15–41)
Anion gap: 8 (ref 5–15)
BILIRUBIN TOTAL: 0.9 mg/dL (ref 0.3–1.2)
BUN: 14 mg/dL (ref 6–20)
CALCIUM: 8.7 mg/dL — AB (ref 8.9–10.3)
CO2: 26 mmol/L (ref 22–32)
CREATININE: 1.09 mg/dL (ref 0.61–1.24)
Chloride: 105 mmol/L (ref 101–111)
GFR calc Af Amer: >60 mL/min (ref >60)
GFR, EST NON AFRICAN AMERICAN: 60 mL/min — AB (ref >60)
GLUCOSE: 133 mg/dL — AB (ref 65–99)
POTASSIUM: 3.2 mmol/L — AB (ref 3.5–5.1)
Sodium: 139 mmol/L (ref 135–145)
TOTAL PROTEIN: 5.8 g/dL — AB (ref 6.5–8.1)

## 2016-01-13 LAB — CBC
HEMATOCRIT: 26 % — AB (ref 39.0–52.0)
HEMOGLOBIN: 8.4 g/dL — AB (ref 13.0–17.0)
MCH: 27.7 pg (ref 26.0–34.0)
MCHC: 32.3 g/dL (ref 30.0–36.0)
MCV: 85.8 fL (ref 78.0–100.0)
Platelets: 310 10*3/uL (ref 150–400)
RBC: 3.03 MIL/uL — ABNORMAL LOW (ref 4.22–5.81)
RDW: 17.2 % — AB (ref 11.5–15.5)
WBC: 9.2 10*3/uL (ref 4.0–10.5)

## 2016-01-13 LAB — MAGNESIUM: Magnesium: 2.2 mg/dL (ref 1.7–2.4)

## 2016-01-13 MED ORDER — METOPROLOL TARTRATE 50 MG PO TABS
50.0000 mg | ORAL_TABLET | Freq: Two times a day (BID) | ORAL | Status: DC
  Administered 2016-01-13 – 2016-01-16 (×7): 50 mg via ORAL
  Filled 2016-01-13 (×7): qty 1

## 2016-01-13 MED ORDER — HYDROCODONE-HOMATROPINE 5-1.5 MG/5ML PO SYRP
5.0000 mL | ORAL_SOLUTION | Freq: Four times a day (QID) | ORAL | Status: DC | PRN
  Administered 2016-01-13 – 2016-01-16 (×5): 5 mL via ORAL
  Filled 2016-01-13 (×5): qty 5

## 2016-01-13 MED ORDER — VANCOMYCIN HCL IN DEXTROSE 1-5 GM/200ML-% IV SOLN
1000.0000 mg | Freq: Two times a day (BID) | INTRAVENOUS | Status: DC
  Administered 2016-01-14 – 2016-01-16 (×6): 1000 mg via INTRAVENOUS
  Filled 2016-01-13 (×7): qty 200

## 2016-01-13 MED ORDER — POTASSIUM CHLORIDE CRYS ER 20 MEQ PO TBCR
40.0000 meq | EXTENDED_RELEASE_TABLET | Freq: Three times a day (TID) | ORAL | Status: AC
  Administered 2016-01-13 – 2016-01-14 (×3): 40 meq via ORAL
  Filled 2016-01-13 (×3): qty 2

## 2016-01-13 MED ORDER — MAGNESIUM SULFATE 2 GM/50ML IV SOLN
2.0000 g | Freq: Once | INTRAVENOUS | Status: AC
  Administered 2016-01-13: 2 g via INTRAVENOUS
  Filled 2016-01-13: qty 50

## 2016-01-13 NOTE — Progress Notes (Signed)
Pt was in second degree heart block for a while during the night. The central Tele notified and also has the strip posted. MD notified.

## 2016-01-13 NOTE — Progress Notes (Signed)
[facility] PROGRESS NOTE  Douglas Carrillo [id number] DOB: October 10, 1928 DOA: 01/09/2016 PCP: Limmie Patricia, MD  Length of stay: 4   Assessment/Plan: Principal Problem:   Sepsis ([place]) Active Problems:   CAD (coronary artery disease) of artery bypass graft   Essential hypertension   DM2 (diabetes mellitus, type 2) (HCC)   Aortic valve stenosis   COPD (chronic obstructive pulmonary disease) (HCC)   Stroke, acute, thrombotic ([place])   Acute respiratory failure ([place])   HCAP (healthcare-associated pneumonia)   CKD (chronic kidney disease)     82 y.o. male with a past medical historyHTN, HLD, CAD, OSA, COPD, DM, GERD, CHF, CVA, prostate cancer who presents with sepsis secondary to HCAP.   on oxygen at night, severe aortic stenosis and recent CVA and 12/12/15 ( Dr. Leonie Man consulted and felt that stroke due to sequela of cardiac cath and to continue Plavix for secondary stroke prevention. Carotid dopplers without significant ICA stenosis.) presents to emergency department with a persistent gradual worsening shortness of breath/cough and fever. Initial evaluation reveals sepsis and acute respiratory failure secondary to pneumonia.   Patient reports he has been home from hospital for 10 days and has been coughing the entire time. Went to see [place] PCP and was provided with IM steroids and by mouth steroid taper which he finished. Reports today cough has become worse it sounds wet but is nonproductive. Associated symptoms include fever decreased appetite generalized weakness. He denies chest pain palpitations abdominal pain nausea vomiting diarrhea. He denies dysuria hematuria frequency or urgency. He denies lower extremity edema or orthopnea.      Assessment and plan #1. Sepsis with staph aureus bacteremia, and  healthcare associated pneumonia  ,right middle and lower lobe pneumonia Doubt it is Staph aureus pneumonia. Max temp 102.8, lactic acid 2.54, heart heart rate 112 respiratory  rate 30 blood pressure stable. ABG with pH of 7.47, PCO2 27.4, PO2 79.0, bicarbonate 19.8. He received 2 L of fluid in the emergency department and antibiotics, Initial lactic acid 1.45, pro calcitonin 5.41, influenza PCR negative, initiated, Continue telemetry Vancomycin and cefazolin pending sensitivities, cefazolin dc 1/19 by ID   Blood cultures positive growing staph aureus in all 4 bottles,  currently on 2 L of nasal cannula Speech therapy evaluation recommends dysphagia diet with nectar thick liquids TEE shows No echo evidence for endocarditis. Heavy aortic valve degenerative calcification could mask a small vegetation. Consider repeat TEE in 2 weeks if there is persistent bacteremia Picc line ok Monday if cultures remain negative and would treat for two weeks then.  #2. Acute respiratory failure with hypoxia  likely related to pneumonia. ABG as noted above. Oxygen saturation level 85% on room air. Improved now  95% on 2 L nasal cannula. He wears oxygen at home only at night. Recently completed a prednisone taper provided by PCP -Continue oxygen supplementation -Nebulizers -Antibiotics per healthcare associated pneumonia protocol -Wean oxygen as able   #3. Healthcare associated pneumonia. Chest x-ray consistent with right middle and lower lobe pneumonia. Some concern for aspiration. See #1   #4. Recent history of stroke. See discharge summary dated January 6. Spent 1 week inpatient rehabilitation. Review indicates some right upper and right lower extremity weakness. -Appears stable at baseline -Physical therapy -Continue Plavix/aspirin  #5. Hypertension. Blood pressure controlled in the emergency department. Home medications include Lasix, imdur, Toprol. Will hold Lasix and imdur -Monitor closely  #6. Diabetes. Uncontrolled, [id number]  Increase  Lantus to 25 Units in addition to  SSI Hold Actos  #7. Chronic kidney disease stage II. Creatinine 1.31 on admission. This appears to be  close to his baseline -Monitor intake and output -Check in the morning  #8. CAD. With abnormal troponin, likely demand ischemia, No chest pain. EKG as above. Initial troponin negative Troponin peaked at  2.98,> 2.21>1.64 Continue aspirin and Plavix Cardiology consultation recommends conservative mx  No need for  2-D echo per cards    #9. Aortic stenosis/history of diastolic dysfunction. Echo with EF of [id number] grade 2 diastolic dysfunction. ENP 338. Does not appear overloaded Cont  Lasix for now, continue metoprolol     10. NSVT, - repeat EKG, increase beta blocker and replete K ,check  mag  DVT prophylaxsis scd's  Code Status:      Code Status Orders        Start     Ordered   01/09/16 1429  Full code   Continuous     01/09/16 1428    Code Status History      Family Communication: Discussed in detail with the patient, all imaging results, lab results explained to the patient   Disposition Plan:  Continue telemetry, PT/OT consultation, cir consult     Consultants:  None   Procedures:  None   Antibiotics: Anti-infectives    Start     Dose/Rate Route Frequency Ordered Stop   01/11/16 2200  ceFAZolin (ANCEF) IVPB 2 g/50 mL premix  Status:  Discontinued     2 g 100 mL/hr over 30 Minutes Intravenous 3 times per day 01/11/16 1430 01/11/16 1434   01/11/16 2000  ceFEPIme (MAXIPIME) 2 g in dextrose 5 % 50 mL IVPB  Status:  Discontinued     2 g 100 mL/hr over 30 Minutes Intravenous Every 12 hours 01/11/16 1243 01/11/16 1413   01/11/16 1800  vancomycin (VANCOCIN) IVPB 750 mg/150 ml premix     750 mg 150 mL/hr over 60 Minutes Intravenous Every 12 hours 01/11/16 1242     01/11/16 1800  ceFAZolin (ANCEF) IVPB 2 g/50 mL premix  Status:  Discontinued     2 g 100 mL/hr over 30 Minutes Intravenous Every 8 hours 01/11/16 1434 01/12/16 1434   01/11/16 1500  ceFAZolin (ANCEF) IVPB 2 g/50 mL premix  Status:  Discontinued     2 g 100 mL/hr over 30 Minutes Intravenous NOW 01/11/16  1431 01/11/16 1434   01/10/16 0730  ceFEPIme (MAXIPIME) 2 g in dextrose 5 % 50 mL IVPB  Status:  Discontinued     2 g 100 mL/hr over 30 Minutes Intravenous Every 24 hours 01/09/16 0807 01/11/16 1243   01/10/16 0730  vancomycin (VANCOCIN) 1,250 mg in sodium chloride 0.9 % 250 mL IVPB  Status:  Discontinued     1,250 mg 166.7 mL/hr over 90 Minutes Intravenous Every 24 hours 01/09/16 0807 01/11/16 1242   01/09/16 0730  vancomycin (VANCOCIN) 1,750 mg in sodium chloride 0.9 % 500 mL IVPB  Status:  Discontinued     1,750 mg 250 mL/hr over 120 Minutes Intravenous  Once 01/09/16 0724 01/09/16 0725   01/09/16 0730  vancomycin (VANCOCIN) 1,500 mg in sodium chloride 0.9 % 500 mL IVPB     1,500 mg 250 mL/hr over 120 Minutes Intravenous  Once 01/09/16 0725 01/09/16 0950   01/09/16 0715  ceFEPIme (MAXIPIME) 2 g in dextrose 5 % 50 mL IVPB     2 g 100 mL/hr over 30 Minutes Intravenous  Once 01/09/16 0703 01/09/16 0809   01/09/16 0715  vancomycin (VANCOCIN) IVPB 1000 mg/200 mL premix  Status:  Discontinued     1,000 mg 200 mL/hr over 60 Minutes Intravenous  Once 01/09/16 0703 01/09/16 0724         HPI/Subjective: Continues to have  SVT, second-degree heart transiently overnight  TEE without vegetation. Repeat blood cultures sent. Feels better. Afebrile, no chills.  Objective: Filed Vitals:   01/12/16 2157 01/12/16 2208 01/13/16 0500 01/13/16 0638  BP: 134/69 104/78  151/68  Pulse: 94 73  91  Temp: 98.6 F (37 C) 97.8 F (36.6 C)  98.5 F (36.9 C)  TempSrc: Oral Oral  Oral  Resp: 16 16  16   Height:      Weight:   86.365 kg (190 lb 6.4 oz)   SpO2: 99% 92%  96%    Intake/Output Summary (Last 24 hours) at 01/13/16 1341 Last data filed at 01/13/16 1118  Gross per 24 hour  Intake    640 ml  Output      0 ml  Net    640 ml    Exam:  General: No acute respiratory distress Lungs: Clear to auscultation bilaterally , some  wheezes , no crackles Cardiovascular: Regular rate and rhythm  without murmur gallop or rub normal S1 and S2 Abdomen: Nontender, nondistended, soft, bowel sounds positive, no rebound, no ascites, no appreciable mass Extremities: No significant cyanosis, clubbing, or edema bilateral lower extremities     Data Review   Micro Results Recent Results (from the past 240 hour(s))  Culture, blood (routine x 2)     Status: None   Collection Time: 01/09/16  7:05 AM  Result Value Ref Range Status   Specimen Description BLOOD RIGHT ANTECUBITAL  Final   Special Requests BOTTLES DRAWN AEROBIC AND ANAEROBIC 5MLS  Final   Culture  Setup Time   Final    GRAM POSITIVE COCCI IN CLUSTERS IN BOTH AEROBIC AND ANAEROBIC BOTTLES CRITICAL RESULT CALLED TO, READ BACK BY AND VERIFIED WITH: J. MILLER AT 0236 ON [id number] BY Craig Guess    Culture METHICILLIN RESISTANT STAPHYLOCOCCUS AUREUS  Final   Report Status 01/12/2016 FINAL  Final   Organism ID, Bacteria METHICILLIN RESISTANT STAPHYLOCOCCUS AUREUS  Final      Susceptibility   Methicillin resistant staphylococcus aureus - MIC*    CIPROFLOXACIN <=0.5 SENSITIVE Sensitive     ERYTHROMYCIN <=0.25 SENSITIVE Sensitive     GENTAMICIN <=0.5 SENSITIVE Sensitive     OXACILLIN >=4 RESISTANT Resistant     TETRACYCLINE <=1 SENSITIVE Sensitive     VANCOMYCIN <=0.5 SENSITIVE Sensitive     TRIMETH/SULFA <=10 SENSITIVE Sensitive     CLINDAMYCIN <=0.25 SENSITIVE Sensitive     RIFAMPIN <=0.5 SENSITIVE Sensitive     Inducible Clindamycin NEGATIVE Sensitive     * METHICILLIN RESISTANT STAPHYLOCOCCUS AUREUS  Culture, blood (routine x 2)     Status: None   Collection Time: 01/09/16  7:20 AM  Result Value Ref Range Status   Specimen Description BLOOD LEFT ANTECUBITAL  Final   Special Requests BOTTLES DRAWN AEROBIC AND ANAEROBIC 10MLS  Final   Culture  Setup Time   Final    GRAM POSITIVE COCCI IN CLUSTERS IN BOTH AEROBIC AND ANAEROBIC BOTTLES CRITICAL RESULT CALLED TO, READ BACK BY AND VERIFIED WITH: J MILLER @0236  01/10/16 MKELLY     Culture   Final    STAPHYLOCOCCUS AUREUS SUSCEPTIBILITIES PERFORMED ON PREVIOUS CULTURE WITHIN THE LAST 5 DAYS.    Report Status 01/12/2016 FINAL  Final  Respiratory virus panel  Status: None   Collection Time: 01/09/16  9:20 AM  Result Value Ref Range Status   Source - RVPAN NASAL SWAB  Corrected   Respiratory Syncytial Virus A Negative Negative Final   Respiratory Syncytial Virus B Negative Negative Final   Influenza A Negative Negative Final   Influenza B Negative Negative Final   Parainfluenza 1 Negative Negative Final   Parainfluenza 2 Negative Negative Final   Parainfluenza 3 Negative Negative Final   Metapneumovirus Negative Negative Final   Rhinovirus Negative Negative Final   Adenovirus Negative Negative Final    Comment: (NOTE) faxed results 01/17/187 @ 1926 lr Performed At: [facility] [address] [place], [place] [id number] Lindon Romp MD [id number]   Urine culture     Status: None   Collection Time: 01/09/16 11:46 AM  Result Value Ref Range Status   Specimen Description URINE, CATHETERIZED  Final   Special Requests NONE  Final   Culture NO GROWTH 1 DAY  Final   Report Status 01/10/2016 FINAL  Final  MRSA PCR Screening     Status: Abnormal   Collection Time: 01/09/16  3:01 PM  Result Value Ref Range Status   MRSA by PCR POSITIVE (A) NEGATIVE Final    Comment:        The GeneXpert MRSA Assay (FDA approved for NASAL specimens only), is one component of a comprehensive MRSA colonization surveillance program. It is not intended to diagnose MRSA infection nor to guide or monitor treatment for MRSA infections. RESULT CALLED TO, READ BACK BY AND VERIFIED WITH: Jeannene Patella RN 17:15 01/09/16 ([email or web address])   Culture, blood (routine x 2)     Status: None (Preliminary result)   Collection Time: 01/12/16  5:26 AM  Result Value Ref Range Status   Specimen Description BLOOD BLOOD RIGHT HAND  Final   Special Requests IN PEDIATRIC BOTTLE 2CC  Final    Culture NO GROWTH 1 DAY  Final   Report Status PENDING  Incomplete  Culture, blood (routine x 2)     Status: None (Preliminary result)   Collection Time: 01/12/16  5:31 AM  Result Value Ref Range Status   Specimen Description BLOOD BLOOD LEFT HAND  Final   Special Requests BOTTLES DRAWN AEROBIC ONLY 5CC  Final   Culture NO GROWTH 1 DAY  Final   Report Status PENDING  Incomplete    Radiology Reports Dg Chest 2 View  01/09/2016  CLINICAL DATA:  Cough and shortness of breath for a week. EXAM: CHEST  2 VIEW COMPARISON:  January 04, 2016. FINDINGS: Stable cardiomegaly. Status post coronary artery bypass graft. No pneumothorax or significant pleural effusion is noted. There is interval development of right middle and lower lobe airspace opacities concerning for pneumonia. Left lung is clear. Bony thorax is unremarkable. IMPRESSION: Findings consistent with right middle and lower lobe pneumonia. Continued radiographic follow-up is recommended to ensure resolution. Electronically Signed   By: Marijo Conception, M.D.   On: 01/09/2016 08:08   Dg Chest 2 View  01/04/2016  CLINICAL DATA:  Wheezing, cough, congestion for 1 week EXAM: CHEST  2 VIEW COMPARISON:  12/13/2015 FINDINGS: Cardiomediastinal silhouette is stable. Central mild vascular congestion without convincing pulmonary edema. Status post CABG. Mild infrahilar bronchitic changes. No segmental infiltrate. Mild hyperinflation. IMPRESSION: Central mild vascular congestion without convincing pulmonary edema. Mild infrahilar bronchitic changes. Status post CABG. Mild hyperinflation. Electronically Signed   By: Lahoma Crocker M.D.   On: 01/04/2016 15:52   Dg Swallowing Func-speech Pathology  01/10/2016  Objective Swallowing Evaluation:   Patient Details Name: Diane Bundren MRN: [id number] Date of Birth: [birth year] Today's Date: 01/10/2016 Time: SLP Start Time (ACUTE ONLY): 0930-SLP Stop Time (ACUTE ONLY): 0945 SLP Time Calculation (min) (ACUTE ONLY): 15 min Past  Medical History: Past Medical History Diagnosis Date . Hypertension  . High cholesterol  . Prostate cancer ([place])  . CAD (coronary artery disease)  . OSA (obstructive sleep apnea)  . COPD (chronic obstructive pulmonary disease) (HCC)    MODERATE . Diabetes mellitus ([place])  . GERD (gastroesophageal reflux disease)  . Vertigo  . Arthritis    OA . Anemia  . CHF (congestive heart failure) (HCC)    grade 2 diastolic dysfuction . CKD (chronic kidney disease)  . Stroke (cerebrum) ([place])    11/2015 . HCAP (healthcare-associated pneumonia)    12/2015 Past Surgical History: Past Surgical History Procedure Laterality Date . Coronary artery bypass graft  03/29/1999   x 4:  LIMA to the LAD,SVG to the diagonal branch of the LAD,SVG to the intermediate coronary artery & SVG to the posterior descending branch of the RCA. . Colon surgery     x 4 . Nm myocar perf wall motion  04/28/2012   Low Risk Scan . Prostate surgery   . Cardiac catheterization   . Cardiac catheterization N/A 12/12/2015   Procedure: Right/Left Heart Cath and Coronary/Graft Angiography;  Surgeon: Troy Sine, MD;  Location: [facility];  Service: Cardiovascular;  Laterality: N/A; . Peripheral vascular catheterization N/A 12/12/2015   Procedure: Aortic Arch Angiography;  Surgeon: Troy Sine, MD;  Location: [facility];  Service: Cardiovascular;  Laterality: N/A; . Eye surgery   HPI: 82 y.o. 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 He is admitted for sepsis secondary to PNA (RML and RLL per imaging report). Subjective: pt alert, denies any difficulties with swallowing Assessment / Plan / Recommendation CHL IP CLINICAL IMPRESSIONS 01/10/2016 Therapy Diagnosis Mild oral phase dysphagia;Suspected primary esophageal dysphagia Clinical Impression Pt demonstrates no significant oropharyngeal dysphagia worrisome for aspiration risk. There is slow bolus formation with occasional premature  spillage to valleculae. Initial sips of nectar showed a delay in swallow initiation, but as study progressed pt was taking consecutive sips of thin liquids with timely swallow. Strength WNL, no penetration or aspiration occurred despite attempt to tax system. Pt did have some delayed coughing; esophageal sweep showed appearence of significant stasis with purees which did not clear with sips of liquid. Pill transited with multiple bites and sips. Discussed esophageal precautions. Recommend regular diet and thin liquids. SLP will f/u for education and tolerance at bedside.  Impact on safety and function Moderate aspiration risk   CHL IP TREATMENT RECOMMENDATION 01/10/2016 Treatment Recommendations Therapy as outlined in treatment plan below   No flowsheet data found. CHL IP DIET RECOMMENDATION 01/10/2016 SLP Diet Recommendations Regular solids;Thin liquid Liquid Administration via Cup;Straw Medication Administration Whole meds with puree Compensations Slow rate;Small sips/bites;Follow solids with liquid Postural Changes Remain semi-upright after after feeds/meals (Comment);Seated upright at 90 degrees   CHL IP OTHER RECOMMENDATIONS 01/10/2016 Recommended Consults -- Oral Care Recommendations Patient independent with oral care Other Recommendations --   CHL IP FOLLOW UP RECOMMENDATIONS 01/10/2016 Follow up Recommendations None   CHL IP FREQUENCY AND DURATION 01/10/2016 Speech Therapy Frequency (ACUTE ONLY) min 1 x/week Treatment Duration 1 week      CHL IP ORAL PHASE 01/10/2016 Oral Phase Impaired Oral -  Pudding Teaspoon -- Oral - Pudding Cup -- Oral - Honey Teaspoon -- Oral - Honey Cup -- Oral - Nectar Teaspoon -- Oral - Nectar Cup Delayed oral transit;Premature spillage Oral - Nectar Straw Delayed oral transit;Premature spillage Oral - Thin Teaspoon -- Oral - Thin Cup Premature spillage Oral - Thin Straw Premature spillage Oral - Puree WFL Oral - Mech Soft -- Oral - Regular WFL Oral - Multi-Consistency -- Oral - Pill --  Oral Phase - Comment --  CHL IP PHARYNGEAL PHASE 01/10/2016 Pharyngeal Phase Impaired Pharyngeal- Pudding Teaspoon -- Pharyngeal -- Pharyngeal- Pudding Cup -- Pharyngeal -- Pharyngeal- Honey Teaspoon -- Pharyngeal -- Pharyngeal- Honey Cup -- Pharyngeal -- Pharyngeal- Nectar Teaspoon -- Pharyngeal -- Pharyngeal- Nectar Cup Delayed swallow initiation-vallecula Pharyngeal -- Pharyngeal- Nectar Straw Delayed swallow initiation-pyriform sinuses Pharyngeal -- Pharyngeal- Thin Teaspoon -- Pharyngeal -- Pharyngeal- Thin Cup WFL Pharyngeal -- Pharyngeal- Thin Straw WFL Pharyngeal -- Pharyngeal- Puree WFL Pharyngeal -- Pharyngeal- Mechanical Soft -- Pharyngeal -- Pharyngeal- Regular WFL Pharyngeal -- Pharyngeal- Multi-consistency -- Pharyngeal -- Pharyngeal- Pill WFL Pharyngeal -- Pharyngeal Comment --  CHL IP CERVICAL ESOPHAGEAL PHASE 01/10/2016 Cervical Esophageal Phase Impaired Pudding Teaspoon -- Pudding Cup -- Honey Teaspoon -- Honey Cup -- Nectar Teaspoon -- Nectar Cup -- Nectar Straw -- Thin Teaspoon -- Thin Cup -- Thin Straw -- Puree -- Mechanical Soft -- Regular -- Multi-consistency -- Pill -- Cervical Esophageal Comment -- CHL IP GO 12/14/2015 Functional Assessment Tool Used skilled clinical judgement Functional Limitations Motor speech Swallow Current Status [phone]) (None) Swallow Goal Status [id number]) (None) Swallow Discharge Status [id number]) (None) Motor Speech Current Status [id number]) [place] Motor Speech Goal Status UK:060616) [place] Motor Speech Goal Status SA:931536) [place] Spoken Language Comprehension Current Status [id number]) (None) Spoken Language Comprehension Goal Status [id number]) (None) Spoken Language Comprehension Discharge Status [id number]) (None) Spoken Language Expression Current Status [id number]) (None) Spoken Language Expression Goal Status [id number]) (None) Spoken Language Expression Discharge Status [id number]:1565649) (None) Attention Current Status [id number]) (None) Attention Goal Status EY:7266000) (None) Attention Discharge Status [id number]) (None)  Memory Current Status [id number]) (None) Memory Goal Status [id number]) (None) Memory Discharge Status QC:115444) (None) Voice Current Status [id number]) (None) Voice Goal Status [id number]) (None) Voice Discharge Status [id number]) (None) Other Speech-Language Pathology Functional Limitation [id number]:978821) (None) Other Speech-Language Pathology Functional Limitation Goal Status [id number]) (None) Other Speech-Language Pathology Functional Limitation Discharge Status [phone]) (None) Herbie Baltimore, MA CCC-SLP [phone] Lynann Beaver 01/10/2016, 10:15 AM                CBC  Recent Labs Lab 01/09/16 0704 01/10/16 0127 01/11/16 0604 01/12/16 0526 01/13/16 0503  WBC 7.2 9.9 9.3 9.0 9.2  HGB 10.3* 8.2* 8.5* 8.8* 8.4*  HCT 31.9* 25.7* 26.3* 27.5* 26.0*  PLT 271 262 282 319 310  MCV 85.1 85.1 86.5 85.1 85.8  MCH 27.5 27.2 28.0 27.2 27.7  MCHC 32.3 31.9 32.3 32.0 32.3  RDW 17.1* 17.0* 17.2* 17.0* 17.2*  LYMPHSABS 0.6*  --   --   --   --   MONOABS 0.3  --   --   --   --   EOSABS 0.0  --   --   --   --   BASOSABS 0.0  --   --   --   --     Chemistries   Recent Labs Lab 01/09/16 0704 01/10/16 0127 01/11/16 0604 01/12/16 0526 01/13/16 0503  NA 133* 137 140 139 139  K 4.4 4.0 3.7 3.5 3.2*  CL 103 107 110 104 105  CO2 20* 20* 24 24 26   GLUCOSE 325* 351* 184* 204* 133*  BUN 21* 23* 20 17 14   CREATININE 1.31* 1.20 1.13 1.11 1.09  CALCIUM 9.4 9.2 9.1 9.1 8.7*  AST 21 26 21 24 22   ALT 20 18 17 22 17   ALKPHOS 73 60 62 61 57  BILITOT 1.0 0.6 0.7 0.8 0.9   ------------------------------------------------------------------------------------------------------------------ estimated creatinine clearance is 54.4 mL/min (by C-G formula based on Cr of 1.09). ------------------------------------------------------------------------------------------------------------------ No results for input(s): HGBA1C in the last 72  hours. ------------------------------------------------------------------------------------------------------------------ No results for input(s): CHOL, HDL, LDLCALC, TRIG, CHOLHDL, LDLDIRECT in the last 72 hours. ------------------------------------------------------------------------------------------------------------------ No results for input(s): TSH, T4TOTAL, T3FREE, THYROIDAB in the last 72 hours.  Invalid input(s): FREET3 ------------------------------------------------------------------------------------------------------------------ No results for input(s): VITAMINB12, FOLATE, FERRITIN, TIBC, IRON, RETICCTPCT in the last 72 hours.  Coagulation profile  Recent Labs Lab 01/09/16 1926  INR 1.48    No results for input(s): DDIMER in the last 72 hours.  Cardiac Enzymes  Recent Labs Lab 01/10/16 0127 01/10/16 0755 01/10/16 1412  TROPONINI 2.98* 2.21* 1.64*   ------------------------------------------------------------------------------------------------------------------ Invalid input(s): POCBNP   CBG:  Recent Labs Lab 01/12/16 1352 01/12/16 1703 01/12/16 2154 01/13/16 0809 01/13/16 1146  GLUCAP 138* 259* 225* 159* 172*       Studies: No results found.    Lab Results  Component Value Date   HGBA1C 6.8* 12/14/2015   Lab Results  Component Value Date   LDLCALC 53 12/14/2015   CREATININE 1.09 01/13/2016       Scheduled Meds: . aspirin  81 mg Oral Daily  . benzonatate  100 mg Oral TID  . Chlorhexidine Gluconate Cloth  6 each Topical Q0600  . clopidogrel  75 mg Oral Daily  . cycloSPORINE  1 drop Both Eyes BID  . feeding supplement (ENSURE ENLIVE)  237 mL Oral BID BM  . furosemide  40 mg Oral Daily  . insulin aspart  0-15 Units Subcutaneous TID WC  . insulin aspart  0-5 Units Subcutaneous QHS  . insulin glargine  25 Units Subcutaneous Daily  . ipratropium-albuterol  3 mL Nebulization TID  . ketotifen  1 drop Both Eyes BID  . latanoprost  1  drop Both Eyes QHS  . metoprolol succinate  50 mg Oral Daily  . mupirocin ointment  1 application Nasal BID  . oxybutynin  5 mg Oral Daily  . pantoprazole  80 mg Oral Q1200  . rosuvastatin  20 mg Oral q1800  . vancomycin  750 mg Intravenous Q12H   Continuous Infusions: . sodium chloride 50 mL/hr at 01/12/16 1051    Principal Problem:   Sepsis ([place]) Active Problems:   CAD (coronary artery disease) of artery bypass graft   Essential hypertension   DM2 (diabetes mellitus, type 2) (HCC)   Aortic valve stenosis   COPD (chronic obstructive pulmonary disease) (HCC)   Stroke, acute, thrombotic ([place])   Acute respiratory failure ([place])   HCAP (healthcare-associated pneumonia)   CKD (chronic kidney disease)    Time spent: 45 minutes   [facility] Pager [phone]. If 7PM-7AM, please contact night-coverage at [email or web address], password [facility] 01/13/2016, 1:41 PM  LOS: 4 days

## 2016-01-13 NOTE — Progress Notes (Signed)
Pharmacy Antibiotic Follow-up Note  Assessment:  Douglas Carrillo is a 82 y.o. year-old male admitted on 01/09/2016.  The patient is currently on day 5 of Vancomycin for MRSA bacteremia. A Vancomycin trough this evening resulted as slightly SUBtherapeutic (VT 14 mcg/ml, goal of 15-20 mcg/ml) - will incresae the dose to achieve the target goal range.   Plan: 1. Adjust Vancomycin to 1g IV every 12 hours 2. Will continue to follow renal function, culture results, LOT, and antibiotic de-escalation plans   Temp (24hrs), Avg:98.4 F (36.9 C), Min:97.8 F (36.6 C), Max:98.6 F (37 C)   Recent Labs Lab 01/09/16 0704 01/10/16 0127 01/11/16 0604 01/12/16 0526 01/13/16 0503  WBC 7.2 9.9 9.3 9.0 9.2    Recent Labs Lab 01/09/16 0704 01/10/16 0127 01/11/16 0604 01/12/16 0526 01/13/16 0503  CREATININE 1.31* 1.20 1.13 1.11 1.09   Estimated Creatinine Clearance: 54.4 mL/min (by C-G formula based on Cr of 1.09).    Allergies  Allergen Reactions  . Mold Extract [Trichophyton] Anaphylaxis  . Lipitor [Atorvastatin] Other (See Comments)    Weakness in legs/myalgias    Antimicrobials this admission: Vanc 1/17 >>  Cefepime 1/17 >> 1/19 Cefazolin 1/19 >> 1/20   Levels/dose changes this admission: 1/21 VT 14 mcg/ml >> increased to 1g/12h  Microbiology results: 1/17 BCx2>> MRSA (Vanc MIC < 0.5) 1/17 UC>> NGF  1/17 MRSA PCR: positive  1/20 BCx >>  Thank you for allowing pharmacy to be a part of this patient's care.  Alycia Rossetti, PharmD, BCPS Clinical Pharmacist Pager: [phone] 01/13/2016 6:33 PM

## 2016-01-14 LAB — GLUCOSE, CAPILLARY
GLUCOSE-CAPILLARY: 170 mg/dL — AB (ref 65–99)
GLUCOSE-CAPILLARY: 169 mg/dL — AB (ref 65–99)
Glucose-Capillary: 125 mg/dL — ABNORMAL HIGH (ref 65–99)
Glucose-Capillary: 148 mg/dL — ABNORMAL HIGH (ref 65–99)

## 2016-01-14 NOTE — Progress Notes (Signed)
[facility] PROGRESS NOTE  Douglas Carrillo [id number] DOB: December 18, 1928 DOA: 01/09/2016 PCP: Limmie Patricia, MD  Length of stay: 5   Assessment/Plan: Principal Problem:   Sepsis ([place]) Active Problems:   CAD (coronary artery disease) of artery bypass graft   Essential hypertension   DM2 (diabetes mellitus, type 2) (HCC)   Aortic valve stenosis   COPD (chronic obstructive pulmonary disease) (HCC)   Stroke, acute, thrombotic ([place])   Acute respiratory failure ([place])   HCAP (healthcare-associated pneumonia)   CKD (chronic kidney disease)     82 y.o. male with a past medical historyHTN, HLD, CAD, OSA, COPD, DM, GERD, CHF, CVA, prostate cancer who presents with sepsis secondary to HCAP.   on oxygen at night, severe aortic stenosis and recent CVA and 12/12/15 ( Dr. Leonie Man consulted and felt that stroke due to sequela of cardiac cath and to continue Plavix for secondary stroke prevention. Carotid dopplers without significant ICA stenosis.) presents to emergency department with a persistent gradual worsening shortness of breath/cough and fever. Initial evaluation reveals sepsis and acute respiratory failure secondary to pneumonia.   Patient reports he has been home from hospital for 10 days and has been coughing the entire time. Went to see [place] PCP and was provided with IM steroids and by mouth steroid taper which he finished. Reports today cough has become worse it sounds wet but is nonproductive. Associated symptoms include fever decreased appetite generalized weakness. He denies chest pain palpitations abdominal pain nausea vomiting diarrhea. He denies dysuria hematuria frequency or urgency. He denies lower extremity edema or orthopnea.      Assessment and plan #1. Sepsis with staph aureus bacteremia, and  healthcare associated pneumonia  ,right middle and lower lobe pneumonia Doubt it is Staph aureus pneumonia. Max temp 102.8, lactic acid 2.54, heart heart rate 112 respiratory  rate 30 blood pressure stable. ABG with pH of 7.47, PCO2 27.4, PO2 79.0, bicarbonate 19.8. He received 2 L of fluid in the emergency department and antibiotics, Initial lactic acid 1.45, pro calcitonin 5.41, influenza PCR negative, initiated, Continue telemetry Antimicrobials during this admission as below Stable on 2 L of nasal cannula Speech therapy evaluation recommends dysphagia diet with nectar thick liquids TEE shows No echo evidence for endocarditis. Heavy aortic valve degenerative calcification could mask a small vegetation. Consider repeat TEE in 2 weeks if there is persistent bacteremia PICC line to be placed on Monday if cultures from 1/20 remain negative and would treat for two weeks then. Febrile this morning, will repeat blood culture for fever greater than 101, patient was 100.6, blood culture from 1/20  NGSF Antimicrobials this admission: Vanc 1/17 >> continue Cefepime 1/17 >> 1/19 Cefazolin 1/19 >> 1/20    #2. Acute respiratory failure with hypoxia  likely related to pneumonia. ABG as noted above. Oxygen saturation level 85% on room air. Improved now  95% on 2 L nasal cannula. He wears oxygen at home only at night. Recently completed a prednisone taper provided by PCP -Continue oxygen supplementation -Nebulizers -Antibiotics per healthcare associated pneumonia protocol -Wean oxygen as able   #3. Healthcare associated pneumonia. Chest x-ray consistent with right middle and lower lobe pneumonia. Some concern for aspiration. See #1   #4. Recent history of stroke. See discharge summary dated January 6. Spent 1 week inpatient rehabilitation. Review indicates some right upper and right lower extremity weakness. -Appears stable at baseline -Physical therapy -Continue Plavix/aspirin  #5. Hypertension. Blood pressure stable, continue Lasix,  Toprol.  Hold  Imdur  #6. Diabetes. Improving  Continue Lantus 25 units in addition to SSI Hold Actos  #7. Chronic kidney disease  stage II. Creatinine 1.31 on admission. This appears to be close to his baseline -Monitor intake and output -Check in the morning  #8. CAD. With abnormal troponin, likely demand ischemia, No chest pain. EKG as above. Initial troponin negative Troponin peaked at  2.98,> 2.21>1.64 Continue aspirin and Plavix Cardiology consultation recommends conservative mx  No need for  2-D echo per cards    #9. Aortic stenosis/history of diastolic dysfunction. Echo with EF of [id number] grade 2 diastolic dysfunction. ENP 338. Does not appear overloaded Cont  Lasix for now, continue metoprolol     10. NSVT, - repeat EKG as needed, continue metoprolol 50 mg  twice a day, keep potassium greater than 4, magnesium greater than 2    DVT prophylaxsis scd's, aspirin, Plavix  Code Status:      Code Status Orders        Start     Ordered   01/09/16 1429  Full code   Continuous     01/09/16 1428    Code Status History      Family Communication: Discussed in detail with the patient, all imaging results, lab results explained to the patient   Disposition Plan:  Continue telemetry, PT/OT consultation, cir consult     Consultants:  None   Procedures:  None   Antibiotics:  Vanc 1/17 >>  Cefepime 1/17 >> 1/19 Cefazolin 1/19 >> 1/20     HPI/Subjective: Fever of 100.6, cough is improving  Objective: Filed Vitals:   01/13/16 2108 01/14/16 0533 01/14/16 0936 01/14/16 1250  BP:  149/65  146/64  Pulse:  85  86  Temp:  100.6 F (38.1 C)  99.5 F (37.5 C)  TempSrc:  Oral  Oral  Resp:  18  19  Height:      Weight:  86.365 kg (190 lb 6.4 oz)    SpO2: 96% 99% 97% 98%    Intake/Output Summary (Last 24 hours) at 01/14/16 1259 Last data filed at 01/14/16 1100  Gross per 24 hour  Intake 1677.5 ml  Output    450 ml  Net 1227.5 ml    Exam:  General: No acute respiratory distress Lungs: Clear to auscultation bilaterally , some  wheezes , no crackles Cardiovascular: Regular rate and  rhythm without murmur gallop or rub normal S1 and S2 Abdomen: Nontender, nondistended, soft, bowel sounds positive, no rebound, no ascites, no appreciable mass Extremities: No significant cyanosis, clubbing, or edema bilateral lower extremities     Data Review   Micro Results Recent Results (from the past 240 hour(s))  Culture, blood (routine x 2)     Status: None   Collection Time: 01/09/16  7:05 AM  Result Value Ref Range Status   Specimen Description BLOOD RIGHT ANTECUBITAL  Final   Special Requests BOTTLES DRAWN AEROBIC AND ANAEROBIC 5MLS  Final   Culture  Setup Time   Final    GRAM POSITIVE COCCI IN CLUSTERS IN BOTH AEROBIC AND ANAEROBIC BOTTLES CRITICAL RESULT CALLED TO, READ BACK BY AND VERIFIED WITH: J. MILLER AT 0236 ON [id number] BY Craig Guess    Culture METHICILLIN RESISTANT STAPHYLOCOCCUS AUREUS  Final   Report Status 01/12/2016 FINAL  Final   Organism ID, Bacteria METHICILLIN RESISTANT STAPHYLOCOCCUS AUREUS  Final      Susceptibility   Methicillin resistant staphylococcus aureus - MIC*    CIPROFLOXACIN <=0.5 SENSITIVE Sensitive  ERYTHROMYCIN <=0.25 SENSITIVE Sensitive     GENTAMICIN <=0.5 SENSITIVE Sensitive     OXACILLIN >=4 RESISTANT Resistant     TETRACYCLINE <=1 SENSITIVE Sensitive     VANCOMYCIN <=0.5 SENSITIVE Sensitive     TRIMETH/SULFA <=10 SENSITIVE Sensitive     CLINDAMYCIN <=0.25 SENSITIVE Sensitive     RIFAMPIN <=0.5 SENSITIVE Sensitive     Inducible Clindamycin NEGATIVE Sensitive     * METHICILLIN RESISTANT STAPHYLOCOCCUS AUREUS  Culture, blood (routine x 2)     Status: None   Collection Time: 01/09/16  7:20 AM  Result Value Ref Range Status   Specimen Description BLOOD LEFT ANTECUBITAL  Final   Special Requests BOTTLES DRAWN AEROBIC AND ANAEROBIC 10MLS  Final   Culture  Setup Time   Final    GRAM POSITIVE COCCI IN CLUSTERS IN BOTH AEROBIC AND ANAEROBIC BOTTLES CRITICAL RESULT CALLED TO, READ BACK BY AND VERIFIED WITH: J MILLER @0236  01/10/16  MKELLY    Culture   Final    STAPHYLOCOCCUS AUREUS SUSCEPTIBILITIES PERFORMED ON PREVIOUS CULTURE WITHIN THE LAST 5 DAYS.    Report Status 01/12/2016 FINAL  Final  Respiratory virus panel     Status: None   Collection Time: 01/09/16  9:20 AM  Result Value Ref Range Status   Source - RVPAN NASAL SWAB  Corrected   Respiratory Syncytial Virus A Negative Negative Final   Respiratory Syncytial Virus B Negative Negative Final   Influenza A Negative Negative Final   Influenza B Negative Negative Final   Parainfluenza 1 Negative Negative Final   Parainfluenza 2 Negative Negative Final   Parainfluenza 3 Negative Negative Final   Metapneumovirus Negative Negative Final   Rhinovirus Negative Negative Final   Adenovirus Negative Negative Final    Comment: (NOTE) faxed results 01/17/187 @ 1926 lr Performed At: [facility] [address] [place], [place] [id number] Lindon Romp MD [id number]   Urine culture     Status: None   Collection Time: 01/09/16 11:46 AM  Result Value Ref Range Status   Specimen Description URINE, CATHETERIZED  Final   Special Requests NONE  Final   Culture NO GROWTH 1 DAY  Final   Report Status 01/10/2016 FINAL  Final  MRSA PCR Screening     Status: Abnormal   Collection Time: 01/09/16  3:01 PM  Result Value Ref Range Status   MRSA by PCR POSITIVE (A) NEGATIVE Final    Comment:        The GeneXpert MRSA Assay (FDA approved for NASAL specimens only), is one component of a comprehensive MRSA colonization surveillance program. It is not intended to diagnose MRSA infection nor to guide or monitor treatment for MRSA infections. RESULT CALLED TO, READ BACK BY AND VERIFIED WITH: Jeannene Patella RN 17:15 01/09/16 ([email or web address])   Culture, blood (routine x 2)     Status: None (Preliminary result)   Collection Time: 01/12/16  5:26 AM  Result Value Ref Range Status   Specimen Description BLOOD BLOOD RIGHT HAND  Final   Special Requests IN PEDIATRIC BOTTLE 2CC   Final   Culture NO GROWTH 1 DAY  Final   Report Status PENDING  Incomplete  Culture, blood (routine x 2)     Status: None (Preliminary result)   Collection Time: 01/12/16  5:31 AM  Result Value Ref Range Status   Specimen Description BLOOD BLOOD LEFT HAND  Final   Special Requests BOTTLES DRAWN AEROBIC ONLY 5CC  Final   Culture NO GROWTH 1 DAY  Final  Report Status PENDING  Incomplete    Radiology Reports Dg Chest 2 View  01/09/2016  CLINICAL DATA:  Cough and shortness of breath for a week. EXAM: CHEST  2 VIEW COMPARISON:  January 04, 2016. FINDINGS: Stable cardiomegaly. Status post coronary artery bypass graft. No pneumothorax or significant pleural effusion is noted. There is interval development of right middle and lower lobe airspace opacities concerning for pneumonia. Left lung is clear. Bony thorax is unremarkable. IMPRESSION: Findings consistent with right middle and lower lobe pneumonia. Continued radiographic follow-up is recommended to ensure resolution. Electronically Signed   By: Marijo Conception, M.D.   On: 01/09/2016 08:08   Dg Chest 2 View  01/04/2016  CLINICAL DATA:  Wheezing, cough, congestion for 1 week EXAM: CHEST  2 VIEW COMPARISON:  12/13/2015 FINDINGS: Cardiomediastinal silhouette is stable. Central mild vascular congestion without convincing pulmonary edema. Status post CABG. Mild infrahilar bronchitic changes. No segmental infiltrate. Mild hyperinflation. IMPRESSION: Central mild vascular congestion without convincing pulmonary edema. Mild infrahilar bronchitic changes. Status post CABG. Mild hyperinflation. Electronically Signed   By: Lahoma Crocker M.D.   On: 01/04/2016 15:52   Dg Swallowing Func-speech Pathology  01/10/2016  Objective Swallowing Evaluation:   Patient Details Name: Douglas Carrillo MRN: [id number] Date of Birth: 01-24-1928 Today's Date: 01/10/2016 Time: SLP Start Time (ACUTE ONLY): 0930-SLP Stop Time (ACUTE ONLY): 0945 SLP Time Calculation (min) (ACUTE ONLY): 15  min Past Medical History: Past Medical History Diagnosis Date . Hypertension  . High cholesterol  . Prostate cancer ([place])  . CAD (coronary artery disease)  . OSA (obstructive sleep apnea)  . COPD (chronic obstructive pulmonary disease) (HCC)    MODERATE . Diabetes mellitus ([place])  . GERD (gastroesophageal reflux disease)  . Vertigo  . Arthritis    OA . Anemia  . CHF (congestive heart failure) (HCC)    grade 2 diastolic dysfuction . CKD (chronic kidney disease)  . Stroke (cerebrum) ([place])    11/2015 . HCAP (healthcare-associated pneumonia)    12/2015 Past Surgical History: Past Surgical History Procedure Laterality Date . Coronary artery bypass graft  03/29/1999   x 4:  LIMA to the LAD,SVG to the diagonal branch of the LAD,SVG to the intermediate coronary artery & SVG to the posterior descending branch of the RCA. . Colon surgery     x 4 . Nm myocar perf wall motion  04/28/2012   Low Risk Scan . Prostate surgery   . Cardiac catheterization   . Cardiac catheterization N/A 12/12/2015   Procedure: Right/Left Heart Cath and Coronary/Graft Angiography;  Surgeon: Troy Sine, MD;  Location: [facility];  Service: Cardiovascular;  Laterality: N/A; . Peripheral vascular catheterization N/A 12/12/2015   Procedure: Aortic Arch Angiography;  Surgeon: Troy Sine, MD;  Location: [facility];  Service: Cardiovascular;  Laterality: N/A; . Eye surgery   HPI: 83 y.o. 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 He is admitted for sepsis secondary to PNA (RML and RLL per imaging report). Subjective: pt alert, denies any difficulties with swallowing Assessment / Plan / Recommendation CHL IP CLINICAL IMPRESSIONS 01/10/2016 Therapy Diagnosis Mild oral phase dysphagia;Suspected primary esophageal dysphagia Clinical Impression Pt demonstrates no significant oropharyngeal dysphagia worrisome for aspiration risk. There is slow bolus formation with occasional  premature spillage to valleculae. Initial sips of nectar showed a delay in swallow initiation, but as study progressed pt was taking consecutive sips of thin liquids with  timely swallow. Strength WNL, no penetration or aspiration occurred despite attempt to tax system. Pt did have some delayed coughing; esophageal sweep showed appearence of significant stasis with purees which did not clear with sips of liquid. Pill transited with multiple bites and sips. Discussed esophageal precautions. Recommend regular diet and thin liquids. SLP will f/u for education and tolerance at bedside.  Impact on safety and function Moderate aspiration risk   CHL IP TREATMENT RECOMMENDATION 01/10/2016 Treatment Recommendations Therapy as outlined in treatment plan below   No flowsheet data found. CHL IP DIET RECOMMENDATION 01/10/2016 SLP Diet Recommendations Regular solids;Thin liquid Liquid Administration via Cup;Straw Medication Administration Whole meds with puree Compensations Slow rate;Small sips/bites;Follow solids with liquid Postural Changes Remain semi-upright after after feeds/meals (Comment);Seated upright at 90 degrees   CHL IP OTHER RECOMMENDATIONS 01/10/2016 Recommended Consults -- Oral Care Recommendations Patient independent with oral care Other Recommendations --   CHL IP FOLLOW UP RECOMMENDATIONS 01/10/2016 Follow up Recommendations None   CHL IP FREQUENCY AND DURATION 01/10/2016 Speech Therapy Frequency (ACUTE ONLY) min 1 x/week Treatment Duration 1 week      CHL IP ORAL PHASE 01/10/2016 Oral Phase Impaired Oral - Pudding Teaspoon -- Oral - Pudding Cup -- Oral - Honey Teaspoon -- Oral - Honey Cup -- Oral - Nectar Teaspoon -- Oral - Nectar Cup Delayed oral transit;Premature spillage Oral - Nectar Straw Delayed oral transit;Premature spillage Oral - Thin Teaspoon -- Oral - Thin Cup Premature spillage Oral - Thin Straw Premature spillage Oral - Puree WFL Oral - Mech Soft -- Oral - Regular WFL Oral - Multi-Consistency -- Oral -  Pill -- Oral Phase - Comment --  CHL IP PHARYNGEAL PHASE 01/10/2016 Pharyngeal Phase Impaired Pharyngeal- Pudding Teaspoon -- Pharyngeal -- Pharyngeal- Pudding Cup -- Pharyngeal -- Pharyngeal- Honey Teaspoon -- Pharyngeal -- Pharyngeal- Honey Cup -- Pharyngeal -- Pharyngeal- Nectar Teaspoon -- Pharyngeal -- Pharyngeal- Nectar Cup Delayed swallow initiation-vallecula Pharyngeal -- Pharyngeal- Nectar Straw Delayed swallow initiation-pyriform sinuses Pharyngeal -- Pharyngeal- Thin Teaspoon -- Pharyngeal -- Pharyngeal- Thin Cup WFL Pharyngeal -- Pharyngeal- Thin Straw WFL Pharyngeal -- Pharyngeal- Puree WFL Pharyngeal -- Pharyngeal- Mechanical Soft -- Pharyngeal -- Pharyngeal- Regular WFL Pharyngeal -- Pharyngeal- Multi-consistency -- Pharyngeal -- Pharyngeal- Pill WFL Pharyngeal -- Pharyngeal Comment --  CHL IP CERVICAL ESOPHAGEAL PHASE 01/10/2016 Cervical Esophageal Phase Impaired Pudding Teaspoon -- Pudding Cup -- Honey Teaspoon -- Honey Cup -- Nectar Teaspoon -- Nectar Cup -- Nectar Straw -- Thin Teaspoon -- Thin Cup -- Thin Straw -- Puree -- Mechanical Soft -- Regular -- Multi-consistency -- Pill -- Cervical Esophageal Comment -- CHL IP GO 12/14/2015 Functional Assessment Tool Used skilled clinical judgement Functional Limitations Motor speech Swallow Current Status [phone]) (None) Swallow Goal Status [id number]) (None) Swallow Discharge Status [id number]) (None) Motor Speech Current Status [id number]) [place] Motor Speech Goal Status UK:060616) [place] Motor Speech Goal Status SA:931536) [place] Spoken Language Comprehension Current Status [id number]) (None) Spoken Language Comprehension Goal Status [id number]) (None) Spoken Language Comprehension Discharge Status [id number]) (None) Spoken Language Expression Current Status FP:837989) (None) Spoken Language Expression Goal Status [id number]:9098795) (None) Spoken Language Expression Discharge Status NF:1565649) (None) Attention Current Status [id number]:1732502) (None) Attention Goal Status EY:7266000) (None) Attention Discharge Status [id number])  (None) Memory Current Status [id number]) (None) Memory Goal Status CF:3682075) (None) Memory Discharge Status QC:115444) (None) Voice Current Status [id number]) (None) Voice Goal Status [id number]) (None) Voice Discharge Status [id number]) (None) Other Speech-Language Pathology Functional Limitation [id number]:978821) (None) Other Speech-Language Pathology Functional Limitation Goal Status [id number]:1448828) (None) Other Speech-Language Pathology Functional Limitation  Discharge Status [id number]) (None) Herbie Baltimore, MA CCC-SLP [phone] Lynann Beaver 01/10/2016, 10:15 AM                CBC  Recent Labs Lab 01/09/16 0704 01/10/16 0127 01/11/16 0604 01/12/16 0526 01/13/16 0503  WBC 7.2 9.9 9.3 9.0 9.2  HGB 10.3* 8.2* 8.5* 8.8* 8.4*  HCT 31.9* 25.7* 26.3* 27.5* 26.0*  PLT 271 262 282 319 310  MCV 85.1 85.1 86.5 85.1 85.8  MCH 27.5 27.2 28.0 27.2 27.7  MCHC 32.3 31.9 32.3 32.0 32.3  RDW 17.1* 17.0* 17.2* 17.0* 17.2*  LYMPHSABS 0.6*  --   --   --   --   MONOABS 0.3  --   --   --   --   EOSABS 0.0  --   --   --   --   BASOSABS 0.0  --   --   --   --     Chemistries   Recent Labs Lab 01/09/16 0704 01/10/16 0127 01/11/16 0604 01/12/16 0526 01/13/16 0503 01/13/16 1707  NA 133* 137 140 139 139  --   K 4.4 4.0 3.7 3.5 3.2*  --   CL 103 107 110 104 105  --   CO2 20* 20* 24 24 26   --   GLUCOSE 325* 351* 184* 204* 133*  --   BUN 21* 23* 20 17 14   --   CREATININE 1.31* 1.20 1.13 1.11 1.09  --   CALCIUM 9.4 9.2 9.1 9.1 8.7*  --   MG  --   --   --   --   --  2.2  AST 21 26 21 24 22   --   ALT 20 18 17 22 17   --   ALKPHOS 73 60 62 61 57  --   BILITOT 1.0 0.6 0.7 0.8 0.9  --    ------------------------------------------------------------------------------------------------------------------ estimated creatinine clearance is 54.4 mL/min (by C-G formula based on Cr of 1.09). ------------------------------------------------------------------------------------------------------------------ No results for input(s): HGBA1C in  the last 72 hours. ------------------------------------------------------------------------------------------------------------------ No results for input(s): CHOL, HDL, LDLCALC, TRIG, CHOLHDL, LDLDIRECT in the last 72 hours. ------------------------------------------------------------------------------------------------------------------ No results for input(s): TSH, T4TOTAL, T3FREE, THYROIDAB in the last 72 hours.  Invalid input(s): FREET3 ------------------------------------------------------------------------------------------------------------------ No results for input(s): VITAMINB12, FOLATE, FERRITIN, TIBC, IRON, RETICCTPCT in the last 72 hours.  Coagulation profile  Recent Labs Lab 01/09/16 1926  INR 1.48    No results for input(s): DDIMER in the last 72 hours.  Cardiac Enzymes  Recent Labs Lab 01/10/16 0127 01/10/16 0755 01/10/16 1412  TROPONINI 2.98* 2.21* 1.64*   ------------------------------------------------------------------------------------------------------------------ Invalid input(s): POCBNP   CBG:  Recent Labs Lab 01/13/16 1146 01/13/16 1641 01/13/16 2135 01/14/16 0747 01/14/16 1206  GLUCAP 172* 230* 192* 125* 148*       Studies: No results found.    Lab Results  Component Value Date   HGBA1C 6.8* 12/14/2015   Lab Results  Component Value Date   LDLCALC 53 12/14/2015   CREATININE 1.09 01/13/2016       Scheduled Meds: . aspirin  81 mg Oral Daily  . benzonatate  100 mg Oral TID  . clopidogrel  75 mg Oral Daily  . cycloSPORINE  1 drop Both Eyes BID  . feeding supplement (ENSURE ENLIVE)  237 mL Oral BID BM  . furosemide  40 mg Oral Daily  . insulin aspart  0-15 Units Subcutaneous TID WC  . insulin aspart  0-5 Units Subcutaneous QHS  . insulin glargine  25 Units  Subcutaneous Daily  . ipratropium-albuterol  3 mL Nebulization TID  . ketotifen  1 drop Both Eyes BID  . latanoprost  1 drop Both Eyes QHS  . metoprolol tartrate   50 mg Oral BID  . mupirocin ointment  1 application Nasal BID  . oxybutynin  5 mg Oral Daily  . pantoprazole  80 mg Oral Q1200  . rosuvastatin  20 mg Oral q1800  . vancomycin  1,000 mg Intravenous Q12H   Continuous Infusions: . sodium chloride 50 mL/hr at 01/14/16 0500    Principal Problem:   Sepsis ([place]) Active Problems:   CAD (coronary artery disease) of artery bypass graft   Essential hypertension   DM2 (diabetes mellitus, type 2) (HCC)   Aortic valve stenosis   COPD (chronic obstructive pulmonary disease) (HCC)   Stroke, acute, thrombotic ([place])   Acute respiratory failure ([place])   HCAP (healthcare-associated pneumonia)   CKD (chronic kidney disease)    Time spent: 45 minutes   [facility] Pager [phone]. If 7PM-7AM, please contact night-coverage at [email or web address], password [facility] 01/14/2016, 12:59 PM  LOS: 5 days

## 2016-01-15 ENCOUNTER — Ambulatory Visit: Payer: Medicare Other | Admitting: Physical Therapy

## 2016-01-15 ENCOUNTER — Encounter (HOSPITAL_COMMUNITY): Payer: Self-pay | Admitting: Cardiovascular Disease

## 2016-01-15 ENCOUNTER — Encounter: Payer: Medicare Other | Admitting: Occupational Therapy

## 2016-01-15 DIAGNOSIS — A403 Sepsis due to Streptococcus pneumoniae: Secondary | ICD-10-CM

## 2016-01-15 LAB — CBC
HEMATOCRIT: 28.1 % — AB (ref 39.0–52.0)
HEMOGLOBIN: 9.1 g/dL — AB (ref 13.0–17.0)
MCH: 27.3 pg (ref 26.0–34.0)
MCHC: 32.4 g/dL (ref 30.0–36.0)
MCV: 84.4 fL (ref 78.0–100.0)
Platelets: 358 10*3/uL (ref 150–400)
RBC: 3.33 MIL/uL — AB (ref 4.22–5.81)
RDW: 17.2 % — ABNORMAL HIGH (ref 11.5–15.5)
WBC: 11.1 10*3/uL — ABNORMAL HIGH (ref 4.0–10.5)

## 2016-01-15 LAB — COMPREHENSIVE METABOLIC PANEL
ALBUMIN: 2.2 g/dL — AB (ref 3.5–5.0)
ALT: 22 U/L (ref 17–63)
ANION GAP: 11 (ref 5–15)
AST: 29 U/L (ref 15–41)
Alkaline Phosphatase: 60 U/L (ref 38–126)
BUN: 13 mg/dL (ref 6–20)
CHLORIDE: 108 mmol/L (ref 101–111)
CO2: 20 mmol/L — AB (ref 22–32)
Calcium: 8.8 mg/dL — ABNORMAL LOW (ref 8.9–10.3)
Creatinine, Ser: 1.13 mg/dL (ref 0.61–1.24)
GFR calc non Af Amer: 57 mL/min — ABNORMAL LOW (ref >60)
GLUCOSE: 174 mg/dL — AB (ref 65–99)
POTASSIUM: 4.2 mmol/L (ref 3.5–5.1)
SODIUM: 139 mmol/L (ref 135–145)
Total Bilirubin: 0.7 mg/dL (ref 0.3–1.2)
Total Protein: 6.5 g/dL (ref 6.5–8.1)

## 2016-01-15 LAB — GLUCOSE, CAPILLARY
GLUCOSE-CAPILLARY: 162 mg/dL — AB (ref 65–99)
GLUCOSE-CAPILLARY: 182 mg/dL — AB (ref 65–99)
Glucose-Capillary: 123 mg/dL — ABNORMAL HIGH (ref 65–99)
Glucose-Capillary: 178 mg/dL — ABNORMAL HIGH (ref 65–99)

## 2016-01-15 LAB — MAGNESIUM: MAGNESIUM: 1.6 mg/dL — AB (ref 1.7–2.4)

## 2016-01-15 NOTE — Evaluation (Addendum)
Occupational Therapy Evaluation Patient Details Name: Douglas Carrillo MRN: [id number] DOB: [birth year] Today's Date: 01/15/2016    History of Present Illness Pt admitted with sepsis secondary to HCAP. PMH - recent left CVA, HTN, DM, CAD, CABG, COPD, CKD, and prostate cancer.   Clinical Impression   Pt admitted with above. Pt requiring assist with ADLs, PTA. Feel pt will benefit from acute OT to increase independence prior to d/c. Recommending CIR for rehab.     Follow Up Recommendations  CIR    Equipment Recommendations  Other (comment) (defer to next venue)    Recommendations for Other Services       Precautions / Restrictions Precautions Precautions: Fall Restrictions Weight Bearing Restrictions: No      Mobility Bed Mobility               General bed mobility comments: not assessed  Transfers Overall transfer level: Needs assistance Transfers: Sit to/from Stand Sit to Stand: Min assist        Comments: assist to boost.    Balance  Pt able to pull up depends while standing-Min guard given by OT.                      ADL Overall ADL's : Needs assistance/impaired                     Lower Body Dressing: Min guard;Sit to/from stand   Toilet Transfer: Minimal assistance (sit to stand from recliner chair)   Toileting- Water quality scientist and Hygiene: Min guard;Sit to/from stand         General ADL Comments: Educated on energy conservation techniques. Encouraged pt to sit in chair.      Vision     Perception     Praxis      Pertinent Vitals/Pain Pain Assessment: No/denies pain; Pt on O2 in session. Monitor reading pt's O2 in 70s but went up to 90s (unsure of accuracy as pt had cold finger)     Hand Dominance     Extremity/Trunk Assessment Upper Extremity Assessment Upper Extremity Assessment: Generalized weakness (able to withstand some resistance with bilateral shoulder flexors; weak grip strength in right hand-pt with  residual weakness in RUE; pt with decreased strength in left shoulder flexors as well)  Sensation: Decreased light touch in RUE   Lower Extremity Assessment Lower Extremity Assessment: Defer to PT evaluation       Communication Communication Communication: No difficulties   Cognition Arousal/Alertness: Awake/alert Behavior During Therapy: WFL for tasks assessed/performed Overall Cognitive Status: Within Functional Limits for tasks assessed                     General Comments       Exercises       Shoulder Instructions      Home Living Family/patient expects to be discharged to:: Inpatient rehab Living Arrangements: Children (since return home from CVA) Available Help at Discharge: Available 24 hours/day;Family Type of Home: House Home Access: Stairs to enter [employer] of Steps: 2 Entrance Stairs-Rails: None Home Layout: One level     Bathroom Shower/Tub: Tub/shower unit;Curtain Shower/tub characteristics: Architectural technologist: Standard     Home Equipment: Environmental consultant - 2 wheels;Cane - single point          Prior Functioning/Environment Level of Independence: Needs assistance  Gait / Transfers Assistance Needed: Assist with ambulating with rolling walker since CVA ADL's / Homemaking Assistance Needed: assist with dressing and bathing  since CVA   Comments: Prior to CVA pt independent active and driving    OT Diagnosis: Generalized weakness   OT Problem List: Decreased strength;Decreased activity tolerance;Decreased knowledge of use of DME or AE;Impaired sensation; other- Decreased knowledge of energy conservation techniques   OT Treatment/Interventions: Self-care/ADL training;DME and/or AE instruction;Balance training;Patient/family education;Therapeutic exercise;Energy conservation;Therapeutic activities    OT Goals(Current goals can be found in the care plan section) Acute Rehab OT Goals Patient Stated Goal: not stated OT Goal  Formulation: With patient Time For Goal Achievement: 01/22/16 Potential to Achieve Goals: Good ADL Goals Pt Will Perform Grooming: with set-up;with supervision;standing (3 tasks) Pt Will Perform Lower Body Bathing: with set-up;with supervision;sit to/from stand Pt Will Perform Lower Body Dressing: with set-up;with supervision;sit to/from stand Pt Will Transfer to Toilet: with supervision;ambulating;with set-up Additional ADL Goal #1: Pt will independently verbalize 3 energy conservation techniques and utilize during session as needed.  OT Frequency: Min 2X/week   Barriers to D/C:            Co-evaluation              End of Session Equipment Utilized During Treatment: Oxygen   Activity Tolerance: Patient limited by fatigue (had just worked with PT) Patient left: in chair;with family/visitor present   Time: [id number] OT Time Calculation (min): 18 min Charges:  OT General Charges $OT Visit: 1 Procedure OT Evaluation $OT Eval Moderate Complexity: 1 Procedure G-CodesBenito Mccreedy OTR/L [id number] 01/15/2016, 4:11 PM

## 2016-01-15 NOTE — Progress Notes (Signed)
Rehab admissions - I am following for potential inpatient rehab admission.  Currently no rehab beds available today.  Only limited beds available tomorrow.  May have to pursue SNF for rehab if no beds open when medically ready.  Call me for questions.  [phone]

## 2016-01-15 NOTE — Progress Notes (Signed)
Nutrition Follow-up  DOCUMENTATION CODES:   Not applicable  INTERVENTION:   -Continue Ensure Enlive po BID, each supplement provides 350 kcal and 20 grams of protein  NUTRITION DIAGNOSIS:   Inadequate oral intake related to poor appetite as evidenced by per patient/family report.  Ongoing  GOAL:   Patient will meet greater than or equal to 90% of their needs  Progressing  MONITOR:   PO intake, Supplement acceptance, Labs, Weight trends, Skin, I & O's  REASON FOR ASSESSMENT:   Malnutrition Screening Tool    ASSESSMENT:   Douglas Carrillo is a 82 y.o. male with a Past Medical History of HTN, HLD, CAD, OSA, COPD, DM, GERD, CHF, CVA, prostate cancer who presents with sepsis secondary to HCAP.   Pt transferred from SDU to medical floor on 01/10/16.   Pt underwent TEE on 01/12/16, which revealed no vegetations.   Pt was bathing at time of visit. SLP saw this AM and pt was advanced to a dysphagia 3 diet with thin liquids. Per SLP note, pt still with significant xerostomia. Per doc flowsheets, meal completion has been improved; PO: 75-100%, however, intake was poor at breakfast (pt consumed only a few bites per SLP evaluation). He is consuming he Ensure Enlive supplements; RD will continue supplements orders.   Therapy following; recommending CIR vs SNF placement.   Labs reviewed.   Diet Order:  DIET DYS 3 Room service appropriate?: Yes; Fluid consistency:: Thin  Skin:  Reviewed, no issues  Last BM:  01/12/16  Height:   Ht Readings from Last 1 Encounters:  01/12/16 6' (1.829 m)    Weight:   Wt Readings from Last 1 Encounters:  01/14/16 190 lb 6.4 oz (86.365 kg)    Ideal Body Weight:  80.9 kg  BMI:  Body mass index is 25.82 kg/(m^2).  Estimated Nutritional Needs:   Kcal:  2000-2200  Protein:  90-105 grams  Fluid:  2.0-2.2 L  EDUCATION NEEDS:   Education needs addressed  [name], RD, LDN, CDE Pager: [phone] After hours Pager: [phone]

## 2016-01-15 NOTE — Progress Notes (Signed)
Speech Language Pathology Treatment: Dysphagia  Patient Details Name: Douglas Carrillo MRN: [id number] DOB: 10/25/1928 Today's Date: 01/15/2016 Time: [id number] SLP Time Calculation (min) (ACUTE ONLY): 33 min  Assessment / Plan / Recommendation Clinical Impression  Pt sitting with nearly full breakfast tray at bedside.  He reports poor appetite and gustatory changes since December admit.  Daughter reports pt with 20 pound weight loss since December.    RN in room with pt upon SLP entrance to room.  Oxygen saturation decreased (mid 80's) therefore oxygen increased from 1 Liter to 4 Liters by RN with improvement in sats to low 90's. Visual/verbal cues to slow rate of breathing with nasal inhalation and oral exhalation helpful.    Observed pt consuming a few bites of grits, sausage, nectar juice and water * thick and thin.    Slow mastication noted with multiple respiratory cycles while food retained in mouth.  Cough noted x4 during session - once with food retained in mouth - 3 times with laughing/phonation.  Xerostomia significant per pt/daughter.    Recommend advance diet to dys3/thin - educated daughter and pt to aspiration precautions, dysphagia mitigation strategies/compensations using teach back and written handouts.  Reiterated importance to assure pt not dyspneic with po intake and to allow rest break prn.    Reflux issues also reported by pt which he states are managed currently with medications.  SLP to follow up for diet tolerance.      HPI HPI: 82 y.o. 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 He is admitted for sepsis secondary to PNA (RML and RLL per imaging report).      SLP Plan  Continue with current plan of care     Recommendations  Diet recommendations: Dysphagia 3 (mechanical soft);Thin liquid Liquids provided via: Cup Medication Administration: Whole meds with liquid Supervision: Patient able to self  feed Compensations: Slow rate;Small sips/bites;Follow solids with liquid (start meals with liquids) Postural Changes and/or Swallow Maneuvers: Seated upright 90 degrees;Upright 30-60 min after meal             Oral Care Recommendations: Oral care BID Follow up Recommendations: 24 hour supervision/assistance Plan: Continue with current plan of care     [place], [place] [facility] SLP [phone]

## 2016-01-15 NOTE — NC FL2 (Signed)
[place] LEVEL OF CARE SCREENING TOOL     IDENTIFICATION  Patient Name: Douglas Carrillo Birthdate: 1928/03/09 Sex: male Admission Date (Current Location): 01/09/2016  [facility] [place] Number:  Herbalist and Address:  [facility] [place] [address] [place], [place] [address]      Provider Number: [id number]  Attending Physician Name and Address:  Reyne Dumas, MD  Relative Name and Phone Number:  [name] daughter, [phone]    Current Level of Care: Hospital Recommended Level of Care: [place] Prior Approval Number:    Date Approved/Denied:   PASRR Number: [id number]  Discharge Plan: SNF    Current Diagnoses: Patient Active Problem List   Diagnosis Date Noted  . Sepsis ([place]) 01/09/2016  . Acute respiratory failure ([place]) 01/09/2016  . HCAP (healthcare-associated pneumonia) 01/09/2016  . CKD (chronic kidney disease) 01/09/2016  . Stroke (cerebrum) ([place])   . Acute respiratory failure with hypoxia ([place])   . Stroke, acute, thrombotic ([place]) 12/15/2015  . Hemiparesis affecting dominant side as late effect of stroke ([place]) 12/15/2015  . Anemia 12/15/2015  . Spastic neurogenic bladder 12/15/2015  . GERD (gastroesophageal reflux disease) 12/15/2015  . Hypokalemia 12/15/2015  . Cerebral infarction due to unspecified mechanism   . CVA (cerebral infarction) 12/13/2015  . Prolonged Q-T interval on ECG 12/13/2015  . Right sided weakness 12/13/2015  . Elevated troponin 12/13/2015  . Acute kidney failure ([place]) 12/13/2015  . Normocytic anemia 12/13/2015  . Diabetes mellitus with complication ([place])   . CAD in native artery   . Aortic stenosis   . OSA (obstructive sleep apnea) 02/08/2015  . COPD, moderate ([place]) 01/12/2015  . COPD (chronic obstructive pulmonary disease) ([place]) 11/10/2014  . Nocturnal hypoxia 11/10/2014  . Dyspnea and respiratory abnormality 10/19/2014  . Aortic valve stenosis 05/27/2014  . CAD  (coronary artery disease) of artery bypass graft 11/30/2013  . CAD (coronary artery disease) 11/30/2013  . Hyperlipidemia with target LDL less than 70 11/30/2013  . Essential hypertension 11/30/2013  . DM2 (diabetes mellitus, type 2) ([place]) 11/30/2013  . RBBB 11/30/2013    Orientation RESPIRATION BLADDER Height & Weight    Self, Time, Situation, Place  O2 (4L) Incontinent 6' (182.9 cm) 190 lbs.  BEHAVIORAL SYMPTOMS/MOOD NEUROLOGICAL BOWEL NUTRITION STATUS   (N/A)   Continent  (Please see DC summary)  AMBULATORY STATUS COMMUNICATION OF NEEDS Skin   Extensive Assist Verbally Normal                       Personal Care Assistance Level of Assistance  Bathing, Feeding, Dressing Bathing Assistance: Limited assistance Feeding assistance: Independent Dressing Assistance: Limited assistance     Functional Limitations Info  Sight Sight Info: Impaired        SPECIAL CARE FACTORS FREQUENCY  PT (By licensed PT)     PT Frequency: 5x/week              Contractures      Additional Factors Info  Code Status, Allergies, Isolation Precautions, Insulin Sliding Scale Code Status Info: Full Allergies Info: Mold Extract, Lipitor   Insulin Sliding Scale Info: insulin aspart (novoLOG) injection 0-15 Units; insulin aspart (novoLOG) injection 0-5 Units; insulin glargine (LANTUS) injection 25 Units Isolation Precautions Info: MRSA     Current Medications (01/15/2016):  This is the current hospital active medication list Current Facility-Administered Medications  Medication Dose Route Frequency Provider Last Rate Last Dose  . 0.9 %  sodium chloride  infusion   Intravenous Continuous Radene Gunning, NP 50 mL/hr at 01/14/16 2100    . acetaminophen (TYLENOL) tablet 650 mg  650 mg Oral Q6H PRN Radene Gunning, NP   650 mg at 01/12/16 1409   Or  . acetaminophen (TYLENOL) suppository 650 mg  650 mg Rectal Q6H PRN Radene Gunning, NP      . albuterol (PROVENTIL) (2.5 MG/3ML) 0.083% nebulizer  solution 3 mL  3 mL Inhalation Q4H PRN Radene Gunning, NP   3 mL at 01/14/16 2257  . aspirin chewable tablet 81 mg  81 mg Oral Daily Valeda Malm Rumbarger, RPH   81 mg at 01/15/16 0935  . benzonatate (TESSALON) capsule 100 mg  100 mg Oral TID Radene Gunning, NP   100 mg at 01/15/16 0935  . clopidogrel (PLAVIX) tablet 75 mg  75 mg Oral Daily Radene Gunning, NP   75 mg at 01/15/16 0935  . cycloSPORINE (RESTASIS) 0.05 % ophthalmic emulsion 1 drop  1 drop Both Eyes BID Radene Gunning, NP   1 drop at 01/15/16 0935  . feeding supplement (ENSURE ENLIVE) (ENSURE ENLIVE) liquid 237 mL  237 mL Oral BID BM Jenifer A Williams, RD   237 mL at 01/15/16 1000  . furosemide (LASIX) tablet 40 mg  40 mg Oral Daily Fay Records, MD   40 mg at 01/15/16 0935  . HYDROcodone-acetaminophen (NORCO/VICODIN) 5-325 MG per tablet 1-2 tablet  1-2 tablet Oral Q4H PRN Lezlie Octave Black, NP      . HYDROcodone-homatropine [facility]) 5-1.5 MG/5ML syrup 5 mL  5 mL Oral Q6H PRN Reyne Dumas, MD   5 mL at 01/15/16 0619  . insulin aspart (novoLOG) injection 0-15 Units  0-15 Units Subcutaneous TID WC Radene Gunning, NP   3 Units at 01/15/16 1208  . insulin aspart (novoLOG) injection 0-5 Units  0-5 Units Subcutaneous QHS Radene Gunning, NP   2 Units at 01/12/16 2328  . insulin glargine (LANTUS) injection 25 Units  25 Units Subcutaneous Daily Reyne Dumas, MD   25 Units at 01/15/16 0935  . ipratropium-albuterol (DUONEB) 0.5-2.5 (3) MG/3ML nebulizer solution 3 mL  3 mL Nebulization TID Reyne Dumas, MD   3 mL at 01/15/16 1347  . ketotifen (ZADITOR) 0.025 % ophthalmic solution 1 drop  1 drop Both Eyes BID Radene Gunning, NP   1 drop at 01/15/16 0936  . latanoprost (XALATAN) 0.005 % ophthalmic solution 1 drop  1 drop Both Eyes QHS Radene Gunning, NP   1 drop at 01/14/16 2145  . metoprolol (LOPRESSOR) tablet 50 mg  50 mg Oral BID Reyne Dumas, MD   50 mg at 01/15/16 0936  . ondansetron (ZOFRAN) tablet 4 mg  4 mg Oral Q6H PRN Radene Gunning, NP       Or  .  ondansetron [facility]) injection 4 mg  4 mg Intravenous Q6H PRN Radene Gunning, NP      . oxybutynin (DITROPAN-XL) 24 hr tablet 5 mg  5 mg Oral Daily Radene Gunning, NP   5 mg at 01/15/16 0935  . pantoprazole (PROTONIX) EC tablet 80 mg  80 mg Oral Q1200 Radene Gunning, NP   80 mg at 01/15/16 1208  . RESOURCE THICKENUP CLEAR   Oral PRN Radene Gunning, NP      . rosuvastatin (CRESTOR) tablet 20 mg  20 mg Oral q1800 Radene Gunning, NP   20 mg at 01/14/16 1721  .  vancomycin (VANCOCIN) IVPB 1000 mg/200 mL premix  1,000 mg Intravenous Q12H Rolla Flatten, RPH   1,000 mg at 01/15/16 [id number]     Discharge Medications: Please see discharge summary for a list of discharge medications.  Relevant Imaging Results:  Relevant Lab Results:   Additional Information SSN: [place]ron [place], [place]

## 2016-01-15 NOTE — Progress Notes (Signed)
[facility] for Infectious Disease   Reason for visit: Follow up on Staph aureus bacteremia  Interval History: TEE without vegetation.  Repeat blood cultures ngtd. Feels better. Afebrile, no chills. Caregiver is not sure if he is going to rehab or not.  If in rehab, does not want a picc line and to do peripheral IV.   Physical Exam: Constitutional:  Filed Vitals:   01/15/16 0519 01/15/16 0936  BP: 152/59 149/71  Pulse: 87 99  Temp: 98.5 F (36.9 C)   Resp: 20    patient appears in NAD Respiratory: Normal respiratory effort; CTA B Cardiovascular: RRR + murmur GI: soft, nt  Review of Systems: Constitutional: negative for fevers and chills Gastrointestinal: negative for nausea, vomiting and diarrhea  Lab Results  Component Value Date   WBC 11.1* 01/15/2016   HGB 9.1* 01/15/2016   HCT 28.1* 01/15/2016   MCV 84.4 01/15/2016   PLT 358 01/15/2016    Lab Results  Component Value Date   CREATININE 1.13 01/15/2016   BUN 13 01/15/2016   NA 139 01/15/2016   K 4.2 01/15/2016   CL 108 01/15/2016   CO2 20* 01/15/2016    Lab Results  Component Value Date   ALT 22 01/15/2016   AST 29 01/15/2016   ALKPHOS 60 01/15/2016     Microbiology: Recent Results (from the past 240 hour(s))  Culture, blood (routine x 2)     Status: None   Collection Time: 01/09/16  7:05 AM  Result Value Ref Range Status   Specimen Description BLOOD RIGHT ANTECUBITAL  Final   Special Requests BOTTLES DRAWN AEROBIC AND ANAEROBIC 5MLS  Final   Culture  Setup Time   Final    GRAM POSITIVE COCCI IN CLUSTERS IN BOTH AEROBIC AND ANAEROBIC BOTTLES CRITICAL RESULT CALLED TO, READ BACK BY AND VERIFIED WITH: J. MILLER AT 0236 ON [id number] BY Craig Guess    Culture METHICILLIN RESISTANT STAPHYLOCOCCUS AUREUS  Final   Report Status 01/12/2016 FINAL  Final   Organism ID, Bacteria METHICILLIN RESISTANT STAPHYLOCOCCUS AUREUS  Final      Susceptibility   Methicillin resistant staphylococcus aureus - MIC*   CIPROFLOXACIN <=0.5 SENSITIVE Sensitive     ERYTHROMYCIN <=0.25 SENSITIVE Sensitive     GENTAMICIN <=0.5 SENSITIVE Sensitive     OXACILLIN >=4 RESISTANT Resistant     TETRACYCLINE <=1 SENSITIVE Sensitive     VANCOMYCIN <=0.5 SENSITIVE Sensitive     TRIMETH/SULFA <=10 SENSITIVE Sensitive     CLINDAMYCIN <=0.25 SENSITIVE Sensitive     RIFAMPIN <=0.5 SENSITIVE Sensitive     Inducible Clindamycin NEGATIVE Sensitive     * METHICILLIN RESISTANT STAPHYLOCOCCUS AUREUS  Culture, blood (routine x 2)     Status: None   Collection Time: 01/09/16  7:20 AM  Result Value Ref Range Status   Specimen Description BLOOD LEFT ANTECUBITAL  Final   Special Requests BOTTLES DRAWN AEROBIC AND ANAEROBIC 10MLS  Final   Culture  Setup Time   Final    GRAM POSITIVE COCCI IN CLUSTERS IN BOTH AEROBIC AND ANAEROBIC BOTTLES CRITICAL RESULT CALLED TO, READ BACK BY AND VERIFIED WITH: J MILLER @0236  01/10/16 MKELLY    Culture   Final    STAPHYLOCOCCUS AUREUS SUSCEPTIBILITIES PERFORMED ON PREVIOUS CULTURE WITHIN THE LAST 5 DAYS.    Report Status 01/12/2016 FINAL  Final  Respiratory virus panel     Status: None   Collection Time: 01/09/16  9:20 AM  Result Value Ref Range Status   Source -  RVPAN NASAL SWAB  Corrected   Respiratory Syncytial Virus A Negative Negative Final   Respiratory Syncytial Virus B Negative Negative Final   Influenza A Negative Negative Final   Influenza B Negative Negative Final   Parainfluenza 1 Negative Negative Final   Parainfluenza 2 Negative Negative Final   Parainfluenza 3 Negative Negative Final   Metapneumovirus Negative Negative Final   Rhinovirus Negative Negative Final   Adenovirus Negative Negative Final    Comment: (NOTE) faxed results 01/17/187 @ 1926 lr Performed At: [facility] [address] [place], [place] [id number] Lindon Romp MD [id number]   Urine culture     Status: None   Collection Time: 01/09/16 11:46 AM  Result Value Ref Range Status    Specimen Description URINE, CATHETERIZED  Final   Special Requests NONE  Final   Culture NO GROWTH 1 DAY  Final   Report Status 01/10/2016 FINAL  Final  MRSA PCR Screening     Status: Abnormal   Collection Time: 01/09/16  3:01 PM  Result Value Ref Range Status   MRSA by PCR POSITIVE (A) NEGATIVE Final    Comment:        The GeneXpert MRSA Assay (FDA approved for NASAL specimens only), is one component of a comprehensive MRSA colonization surveillance program. It is not intended to diagnose MRSA infection nor to guide or monitor treatment for MRSA infections. RESULT CALLED TO, READ BACK BY AND VERIFIED WITH: Jeannene Patella RN 17:15 01/09/16 ([email or web address])   Culture, blood (routine x 2)     Status: None (Preliminary result)   Collection Time: 01/12/16  5:26 AM  Result Value Ref Range Status   Specimen Description BLOOD BLOOD RIGHT HAND  Final   Special Requests IN PEDIATRIC BOTTLE 2CC  Final   Culture NO GROWTH 2 DAYS  Final   Report Status PENDING  Incomplete  Culture, blood (routine x 2)     Status: None (Preliminary result)   Collection Time: 01/12/16  5:31 AM  Result Value Ref Range Status   Specimen Description BLOOD BLOOD LEFT HAND  Final   Special Requests BOTTLES DRAWN AEROBIC ONLY 5CC  Final   Culture NO GROWTH 2 DAYS  Final   Report Status PENDING  Incomplete    Impression/Plan:  1. Staph aureus bacteremia - negative vegetation.  Repeat blood cultures negative. Continue with vancomycin through February 2nd -does not want a picc line if rehab will take him with a peripheral IV, though OK from ID standpoint to put in picc line if needed; asking also about midline instead and I am not sure if ok with vancomycin or need actual picc line.   Discussed with pharmacy and will investigate  2.  CKD - will need to monitor with vancomycin per pharmacy.   I will sign off, will arrange follow up next week

## 2016-01-15 NOTE — Progress Notes (Signed)
UR COMPLETED  

## 2016-01-15 NOTE — Clinical Social Work Note (Signed)
Clinical Social Work Assessment  Patient Details  Name: Douglas Carrillo MRN: [id number] Date of Birth: [birth year]  Date of referral:  01/15/16               Reason for consult:  Facility Placement                Permission sought to share information with:  Facility Sport and exercise psychologist, Family Supports Permission granted to share information::  Yes, Verbal Permission Granted  Name::     [name]  Agency::  [facility]  Relationship::  Daughter  Contact Information:  [phone]  Housing/Transportation Living arrangements for the past 2 months:  [place] of Information:  Patient, Adult Children Patient Interpreter Needed:  None Criminal Activity/Legal Involvement Pertinent to Current Situation/Hospitalization:  No - Comment as needed Significant Relationships:  Adult Children Lives with:  Adult Children Do you feel safe going back to the place where you live?  No Need for family participation in patient care:  Yes (Comment)  Care giving concerns:  CSW received referral for possible SNF placement at time of discharge. CSW met with patient and patient's daughter regarding PT recommendation of SNF placement at time of discharge. Per patient's daughter, patient's daughter is currently unable to care for patient at their home given patient's current physical needs and fall risk. Patient and patient's daughter expressed understanding of PT recommendation and are agreeable to SNF placement at time of discharge. CSW to continue to follow and assist with discharge planning needs.   Social Worker assessment / plan:  CSW spoke with patient and patient's daughter concerning possibility of rehab at [facility] before returning home if patient is not admitted to inpatient rehab.  Employment status:  Retired Forensic scientist:  [employer] PT Recommendations:  [place], [place] / Referral to community resources:  [place]  Patient/Family's Response to care:  Patient and patient's daughter recognize need for rehab before returning home and are agreeable to a SNF in [place]. Patient reported preference for [facility].  Patient/Family's Understanding of and Emotional Response to Diagnosis, Current Treatment, and Prognosis:  Patient is realistic regarding therapy needs. No questions/concerns about plan or treatment.    Emotional Assessment Appearance:  Appears stated age Attitude/Demeanor/Rapport:   (Appropriate) Affect (typically observed):  Accepting, Appropriate Orientation:  Oriented to Self, Oriented to Place, Oriented to  Time, Oriented to Situation Alcohol / Substance use:  Not Applicable Psych involvement (Current and /or in the community):  No (Comment)  Discharge Needs  Concerns to be addressed:  Care Coordination Readmission within the last 30 days:  No Current discharge risk:  None Barriers to Discharge:  Continued Medical Work up   [employer], [place] 01/15/2016, 2:25 PM

## 2016-01-15 NOTE — Progress Notes (Signed)
Physical Therapy Treatment Patient Details Name: Douglas Carrillo MRN: [id number] DOB: February 08, 1928 Today's Date: 01/27/16    History of Present Illness Pt adm with PNA. PMH - recent lt CVA, HTN, DM, CAD, CABG,     PT Comments    Pt fatiguing very quickly with ambulation.   Follow Up Recommendations  CIR (bed not available and may have to go ST-SNF instead)     Equipment Recommendations  None recommended by PT    Recommendations for Other Services       Precautions / Restrictions Precautions Precautions: Fall Restrictions Weight Bearing Restrictions: No    Mobility  Bed Mobility                  Transfers Overall transfer level: Needs assistance Equipment used: Rolling walker (2 wheeled) Transfers: Sit to/from Stand Sit to Stand: Mod assist         General transfer comment: Assist to bring hips up and for balance.  Ambulation/Gait Ambulation/Gait assistance: Min assist;Mod assist;+2 safety/equipment Ambulation Distance (Feet): 20 Feet Assistive device: Rolling walker (2 wheeled) Gait Pattern/deviations: Step-through pattern;Decreased step length - right;Decreased dorsiflexion - right;Decreased step length - left;Shuffle;Trunk flexed Gait velocity: decr   General Gait Details: Pt fatigues very quickly and rt foot drag becomes more pronounced and begins having incr trunk flexion   Stairs            Wheelchair Mobility    Modified Rankin (Stroke Patients Only)       Balance Overall balance assessment: Needs assistance Sitting-balance support: No upper extremity supported Sitting balance-Leahy Scale: Fair     Standing balance support: Bilateral upper extremity supported Standing balance-Leahy Scale: Poor Standing balance comment: walker and min A for static standing.                    Cognition Arousal/Alertness: Awake/alert Behavior During Therapy: WFL for tasks assessed/performed Overall Cognitive Status: Within Functional  Limits for tasks assessed                      Exercises      General Comments        Pertinent Vitals/Pain Pain Assessment: No/denies pain    Home Living                      Prior Function            PT Goals (current goals can now be found in the care plan section) Progress towards PT goals: Not progressing toward goals - comment (becoming weaker and fatigues quickly)    Frequency  Min 3X/week    PT Plan Current plan remains appropriate    Co-evaluation             End of Session Equipment Utilized During Treatment: Gait belt;Oxygen Activity Tolerance: Patient limited by fatigue Patient left: in chair;with call bell/phone within reach;with chair alarm set;with family/visitor present     Time: [id number] PT Time Calculation (min) (ACUTE ONLY): 23 min  Charges:  $Gait Training: 23-37 mins                    G Codes:      Quinterrius Errington Jan 27, 2016, 3:37 PM [facility] PT [phone]

## 2016-01-15 NOTE — Progress Notes (Signed)
[facility] PROGRESS NOTE  Douglas Carrillo [id number] DOB: 1928/07/06 DOA: 01/09/2016 PCP: Limmie Patricia, MD  Length of stay: 6   Assessment/Plan: Principal Problem:   Sepsis ([place]) Active Problems:   CAD (coronary artery disease) of artery bypass graft   Essential hypertension   DM2 (diabetes mellitus, type 2) (HCC)   Aortic valve stenosis   COPD (chronic obstructive pulmonary disease) (HCC)   Stroke, acute, thrombotic ([place])   Acute respiratory failure ([place])   HCAP (healthcare-associated pneumonia)   CKD (chronic kidney disease)   History of present illness  82 y.o. male with a past medical historyHTN, HLD, CAD, OSA, COPD, DM, GERD, CHF, CVA, prostate cancer who presents with sepsis secondary to HCAP.   on oxygen at night, severe aortic stenosis and recent CVA and 12/12/15 ( Dr. Leonie Man consulted and felt that stroke due to sequela of cardiac cath and to continue Plavix for secondary stroke prevention. Carotid dopplers without significant ICA stenosis.) presents to emergency department with a persistent gradual worsening shortness of breath/cough and fever. Initial evaluation reveals sepsis and acute respiratory failure secondary to pneumonia.   Patient reports he has been home from hospital for 10 days and has been coughing the entire time. Went to see [place] PCP and was provided with IM steroids and by mouth steroid taper which he finished. Reports today cough has become worse it sounds wet but is nonproductive. Associated symptoms include fever decreased appetite generalized weakness. He denies chest pain palpitations abdominal pain nausea vomiting diarrhea. He denies dysuria hematuria frequency or urgency. He denies lower extremity edema or orthopnea.    ,  Assessment and plan #1. Sepsis with staph aureus bacteremia, and  healthcare associated pneumonia  ,right middle and lower lobe pneumonia Doubt it is Staph aureus pneumonia. Max temp 102.8, lactic acid 2.54, heart  heart rate 112 respiratory rate 30 blood pressure stable. ABG with pH of 7.47, PCO2 27.4, PO2 79.0, bicarbonate 19.8. He received 2 L of fluid in the emergency department and antibiotics, Initial lactic acid 1.45, pro calcitonin 5.41, influenza PCR negative, initiated, Continue telemetry Antimicrobials during this admission as below Stable on 2 L of nasal cannula Speech therapy evaluation recommends dysphagia diet with nectar thick liquids TEE shows No echo evidence for endocarditis. Heavy aortic valve degenerative calcification could mask a small vegetation. Consider repeat TEE in 2 weeks if there is persistent bacteremia PICC line to be placed on Monday if cultures from 1/20 remain negative and would treat for two weeks then. Febrile this morning, will repeat blood culture for fever greater than 101, patient was 100.6, blood culture from 1/20  NGSF, family refusing PICC line, not sure if patient will be going to CIR, not sure if CIR   will accept the patient without a PICC line Antimicrobials this admission: Vanc 1/17 >> continue until 01/25/16 Cefepime 1/17 >> 1/19 Cefazolin 1/19 >> 1/20    #2. Acute respiratory failure with hypoxia  likely related to pneumonia. ABG as noted above. Oxygen saturation level 85% on room air. Improved now  95% on 2 L nasal cannula. He wears oxygen at home only at night. Recently completed a prednisone taper provided by PCP -Continue oxygen supplementation -Nebulizers -Antibiotics per healthcare associated pneumonia protocol -Wean oxygen as able   #3. Healthcare associated pneumonia. Chest x-ray consistent with right middle and lower lobe pneumonia. Some concern for aspiration. See #1   #4. Recent history of stroke. See discharge summary dated January 6. Spent 1 week  inpatient rehabilitation. Review indicates some right upper and right lower extremity weakness. -Appears stable at baseline -Physical therapy -Continue Plavix/aspirin  #5. Hypertension. Blood  pressure stable, continue Lasix,  Toprol.  Hold Imdur  #6. Diabetes. Improving  Continue Lantus 25 units in addition to SSI Hold Actos  #7. Chronic kidney disease stage II. Creatinine 1.31 on admission. This appears to be close to his baseline -Monitor intake and output -Check in the morning  #8. CAD. With abnormal troponin, likely demand ischemia, No chest pain. EKG as above. Initial troponin negative Troponin peaked at  2.98,> 2.21>1.64 Continue aspirin and Plavix Cardiology consultation recommends conservative mx  No need for  2-D echo per cards    #9. Aortic stenosis/history of diastolic dysfunction. Echo with EF of [id number] grade 2 diastolic dysfunction. ENP 338. Does not appear overloaded Cont  Lasix for now, continue metoprolol     10. NSVT, - repeat EKG as needed, continue metoprolol 50 mg  twice a day, keep potassium greater than 4, magnesium greater than 2    DVT prophylaxsis scd's, aspirin, Plavix  Code Status:      Code Status Orders        Start     Ordered   01/09/16 1429  Full code   Continuous     01/09/16 1428    Code Status History      Family Communication: Discussed in detail with the patient, all imaging results, lab results explained to the patient   Disposition Plan:  CIR vs SNF   Consultants:  None   Procedures:  None   Antibiotics:  Vanc 1/17 >>  Cefepime 1/17 >> 1/19 Cefazolin 1/19 >> 1/20     HPI/Subjective: Fever of 100.6, cough is improving Feels better. Afebrile, no chills. Objective: Filed Vitals:   01/14/16 2257 01/15/16 0519 01/15/16 0811 01/15/16 0936  BP:  152/59  149/71  Pulse:  87  99  Temp:  98.5 F (36.9 C)    TempSrc:  Oral    Resp:  20    Height:      Weight:      SpO2: 97% 91% 87% 91%    Intake/Output Summary (Last 24 hours) at 01/15/16 1247 Last data filed at 01/15/16 [id number]  Gross per 24 hour  Intake   1755 ml  Output    775 ml  Net    980 ml    Exam:  General: No acute respiratory  distress Lungs: Clear to auscultation bilaterally , some  wheezes , no crackles Cardiovascular: Regular rate and rhythm without murmur gallop or rub normal S1 and S2 Abdomen: Nontender, nondistended, soft, bowel sounds positive, no rebound, no ascites, no appreciable mass Extremities: No significant cyanosis, clubbing, or edema bilateral lower extremities     Data Review   Micro Results Recent Results (from the past 240 hour(s))  Culture, blood (routine x 2)     Status: None   Collection Time: 01/09/16  7:05 AM  Result Value Ref Range Status   Specimen Description BLOOD RIGHT ANTECUBITAL  Final   Special Requests BOTTLES DRAWN AEROBIC AND ANAEROBIC 5MLS  Final   Culture  Setup Time   Final    GRAM POSITIVE COCCI IN CLUSTERS IN BOTH AEROBIC AND ANAEROBIC BOTTLES CRITICAL RESULT CALLED TO, READ BACK BY AND VERIFIED WITH: J. MILLER AT 0236 ON [id number] BY Craig Guess    Culture METHICILLIN RESISTANT STAPHYLOCOCCUS AUREUS  Final   Report Status 01/12/2016 FINAL  Final   Organism ID, Bacteria METHICILLIN  RESISTANT STAPHYLOCOCCUS AUREUS  Final      Susceptibility   Methicillin resistant staphylococcus aureus - MIC*    CIPROFLOXACIN <=0.5 SENSITIVE Sensitive     ERYTHROMYCIN <=0.25 SENSITIVE Sensitive     GENTAMICIN <=0.5 SENSITIVE Sensitive     OXACILLIN >=4 RESISTANT Resistant     TETRACYCLINE <=1 SENSITIVE Sensitive     VANCOMYCIN <=0.5 SENSITIVE Sensitive     TRIMETH/SULFA <=10 SENSITIVE Sensitive     CLINDAMYCIN <=0.25 SENSITIVE Sensitive     RIFAMPIN <=0.5 SENSITIVE Sensitive     Inducible Clindamycin NEGATIVE Sensitive     * METHICILLIN RESISTANT STAPHYLOCOCCUS AUREUS  Culture, blood (routine x 2)     Status: None   Collection Time: 01/09/16  7:20 AM  Result Value Ref Range Status   Specimen Description BLOOD LEFT ANTECUBITAL  Final   Special Requests BOTTLES DRAWN AEROBIC AND ANAEROBIC 10MLS  Final   Culture  Setup Time   Final    GRAM POSITIVE COCCI IN CLUSTERS IN BOTH  AEROBIC AND ANAEROBIC BOTTLES CRITICAL RESULT CALLED TO, READ BACK BY AND VERIFIED WITH: J MILLER @0236  01/10/16 MKELLY    Culture   Final    STAPHYLOCOCCUS AUREUS SUSCEPTIBILITIES PERFORMED ON PREVIOUS CULTURE WITHIN THE LAST 5 DAYS.    Report Status 01/12/2016 FINAL  Final  Respiratory virus panel     Status: None   Collection Time: 01/09/16  9:20 AM  Result Value Ref Range Status   Source - RVPAN NASAL SWAB  Corrected   Respiratory Syncytial Virus A Negative Negative Final   Respiratory Syncytial Virus B Negative Negative Final   Influenza A Negative Negative Final   Influenza B Negative Negative Final   Parainfluenza 1 Negative Negative Final   Parainfluenza 2 Negative Negative Final   Parainfluenza 3 Negative Negative Final   Metapneumovirus Negative Negative Final   Rhinovirus Negative Negative Final   Adenovirus Negative Negative Final    Comment: (NOTE) faxed results 01/17/187 @ 1926 lr Performed At: [facility] [address] [place], [place] [id number] Lindon Romp MD [id number]   Urine culture     Status: None   Collection Time: 01/09/16 11:46 AM  Result Value Ref Range Status   Specimen Description URINE, CATHETERIZED  Final   Special Requests NONE  Final   Culture NO GROWTH 1 DAY  Final   Report Status 01/10/2016 FINAL  Final  MRSA PCR Screening     Status: Abnormal   Collection Time: 01/09/16  3:01 PM  Result Value Ref Range Status   MRSA by PCR POSITIVE (A) NEGATIVE Final    Comment:        The GeneXpert MRSA Assay (FDA approved for NASAL specimens only), is one component of a comprehensive MRSA colonization surveillance program. It is not intended to diagnose MRSA infection nor to guide or monitor treatment for MRSA infections. RESULT CALLED TO, READ BACK BY AND VERIFIED WITH: Jeannene Patella RN 17:15 01/09/16 ([email or web address])   Culture, blood (routine x 2)     Status: None (Preliminary result)   Collection Time: 01/12/16  5:26 AM  Result Value  Ref Range Status   Specimen Description BLOOD BLOOD RIGHT HAND  Final   Special Requests IN PEDIATRIC BOTTLE 2CC  Final   Culture NO GROWTH 2 DAYS  Final   Report Status PENDING  Incomplete  Culture, blood (routine x 2)     Status: None (Preliminary result)   Collection Time: 01/12/16  5:31 AM  Result Value Ref Range Status  Specimen Description BLOOD BLOOD LEFT HAND  Final   Special Requests BOTTLES DRAWN AEROBIC ONLY 5CC  Final   Culture NO GROWTH 2 DAYS  Final   Report Status PENDING  Incomplete    Radiology Reports Dg Chest 2 View  01/09/2016  CLINICAL DATA:  Cough and shortness of breath for a week. EXAM: CHEST  2 VIEW COMPARISON:  January 04, 2016. FINDINGS: Stable cardiomegaly. Status post coronary artery bypass graft. No pneumothorax or significant pleural effusion is noted. There is interval development of right middle and lower lobe airspace opacities concerning for pneumonia. Left lung is clear. Bony thorax is unremarkable. IMPRESSION: Findings consistent with right middle and lower lobe pneumonia. Continued radiographic follow-up is recommended to ensure resolution. Electronically Signed   By: Marijo Conception, M.D.   On: 01/09/2016 08:08   Dg Chest 2 View  01/04/2016  CLINICAL DATA:  Wheezing, cough, congestion for 1 week EXAM: CHEST  2 VIEW COMPARISON:  12/13/2015 FINDINGS: Cardiomediastinal silhouette is stable. Central mild vascular congestion without convincing pulmonary edema. Status post CABG. Mild infrahilar bronchitic changes. No segmental infiltrate. Mild hyperinflation. IMPRESSION: Central mild vascular congestion without convincing pulmonary edema. Mild infrahilar bronchitic changes. Status post CABG. Mild hyperinflation. Electronically Signed   By: Lahoma Crocker M.D.   On: 01/04/2016 15:52   Dg Swallowing Func-speech Pathology  01/10/2016  Objective Swallowing Evaluation:   Patient Details Name: Douglas Carrillo MRN: [id number] Date of Birth: [birth year] Today's Date: 01/10/2016  Time: SLP Start Time (ACUTE ONLY): 0930-SLP Stop Time (ACUTE ONLY): 0945 SLP Time Calculation (min) (ACUTE ONLY): 15 min Past Medical History: Past Medical History Diagnosis Date . Hypertension  . High cholesterol  . Prostate cancer ([place])  . CAD (coronary artery disease)  . OSA (obstructive sleep apnea)  . COPD (chronic obstructive pulmonary disease) (HCC)    MODERATE . Diabetes mellitus ([place])  . GERD (gastroesophageal reflux disease)  . Vertigo  . Arthritis    OA . Anemia  . CHF (congestive heart failure) (HCC)    grade 2 diastolic dysfuction . CKD (chronic kidney disease)  . Stroke (cerebrum) ([place])    11/2015 . HCAP (healthcare-associated pneumonia)    12/2015 Past Surgical History: Past Surgical History Procedure Laterality Date . Coronary artery bypass graft  03/29/1999   x 4:  LIMA to the LAD,SVG to the diagonal branch of the LAD,SVG to the intermediate coronary artery & SVG to the posterior descending branch of the RCA. . Colon surgery     x 4 . Nm myocar perf wall motion  04/28/2012   Low Risk Scan . Prostate surgery   . Cardiac catheterization   . Cardiac catheterization N/A 12/12/2015   Procedure: Right/Left Heart Cath and Coronary/Graft Angiography;  Surgeon: Troy Sine, MD;  Location: [place] [facility];  Service: Cardiovascular;  Laterality: N/A; . Peripheral vascular catheterization N/A 12/12/2015   Procedure: Aortic Arch Angiography;  Surgeon: Troy Sine, MD;  Location: [place] [facility];  Service: Cardiovascular;  Laterality: N/A; . Eye surgery   HPI: 82 y.o. 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 He is admitted for sepsis secondary to PNA (RML and RLL per imaging report). Subjective: pt alert, denies any difficulties with swallowing Assessment / Plan / Recommendation CHL IP CLINICAL IMPRESSIONS 01/10/2016 Therapy Diagnosis Mild oral phase dysphagia;Suspected primary esophageal dysphagia Clinical Impression Pt demonstrates no  significant oropharyngeal dysphagia worrisome for aspiration risk. There is slow bolus  formation with occasional premature spillage to valleculae. Initial sips of nectar showed a delay in swallow initiation, but as study progressed pt was taking consecutive sips of thin liquids with timely swallow. Strength WNL, no penetration or aspiration occurred despite attempt to tax system. Pt did have some delayed coughing; esophageal sweep showed appearence of significant stasis with purees which did not clear with sips of liquid. Pill transited with multiple bites and sips. Discussed esophageal precautions. Recommend regular diet and thin liquids. SLP will f/u for education and tolerance at bedside.  Impact on safety and function Moderate aspiration risk   CHL IP TREATMENT RECOMMENDATION 01/10/2016 Treatment Recommendations Therapy as outlined in treatment plan below   No flowsheet data found. CHL IP DIET RECOMMENDATION 01/10/2016 SLP Diet Recommendations Regular solids;Thin liquid Liquid Administration via Cup;Straw Medication Administration Whole meds with puree Compensations Slow rate;Small sips/bites;Follow solids with liquid Postural Changes Remain semi-upright after after feeds/meals (Comment);Seated upright at 90 degrees   CHL IP OTHER RECOMMENDATIONS 01/10/2016 Recommended Consults -- Oral Care Recommendations Patient independent with oral care Other Recommendations --   CHL IP FOLLOW UP RECOMMENDATIONS 01/10/2016 Follow up Recommendations None   CHL IP FREQUENCY AND DURATION 01/10/2016 Speech Therapy Frequency (ACUTE ONLY) min 1 x/week Treatment Duration 1 week      CHL IP ORAL PHASE 01/10/2016 Oral Phase Impaired Oral - Pudding Teaspoon -- Oral - Pudding Cup -- Oral - Honey Teaspoon -- Oral - Honey Cup -- Oral - Nectar Teaspoon -- Oral - Nectar Cup Delayed oral transit;Premature spillage Oral - Nectar Straw Delayed oral transit;Premature spillage Oral - Thin Teaspoon -- Oral - Thin Cup Premature spillage Oral - Thin  Straw Premature spillage Oral - Puree WFL Oral - Mech Soft -- Oral - Regular WFL Oral - Multi-Consistency -- Oral - Pill -- Oral Phase - Comment --  CHL IP PHARYNGEAL PHASE 01/10/2016 Pharyngeal Phase Impaired Pharyngeal- Pudding Teaspoon -- Pharyngeal -- Pharyngeal- Pudding Cup -- Pharyngeal -- Pharyngeal- Honey Teaspoon -- Pharyngeal -- Pharyngeal- Honey Cup -- Pharyngeal -- Pharyngeal- Nectar Teaspoon -- Pharyngeal -- Pharyngeal- Nectar Cup Delayed swallow initiation-vallecula Pharyngeal -- Pharyngeal- Nectar Straw Delayed swallow initiation-pyriform sinuses Pharyngeal -- Pharyngeal- Thin Teaspoon -- Pharyngeal -- Pharyngeal- Thin Cup WFL Pharyngeal -- Pharyngeal- Thin Straw WFL Pharyngeal -- Pharyngeal- Puree WFL Pharyngeal -- Pharyngeal- Mechanical Soft -- Pharyngeal -- Pharyngeal- Regular WFL Pharyngeal -- Pharyngeal- Multi-consistency -- Pharyngeal -- Pharyngeal- Pill WFL Pharyngeal -- Pharyngeal Comment --  CHL IP CERVICAL ESOPHAGEAL PHASE 01/10/2016 Cervical Esophageal Phase Impaired Pudding Teaspoon -- Pudding Cup -- Honey Teaspoon -- Honey Cup -- Nectar Teaspoon -- Nectar Cup -- Nectar Straw -- Thin Teaspoon -- Thin Cup -- Thin Straw -- Puree -- Mechanical Soft -- Regular -- Multi-consistency -- Pill -- Cervical Esophageal Comment -- CHL IP GO 12/14/2015 Functional Assessment Tool Used skilled clinical judgement Functional Limitations Motor speech Swallow Current Status [phone]) (None) Swallow Goal Status [id number]) (None) Swallow Discharge Status [id number]) (None) Motor Speech Current Status [id number]) [place] Motor Speech Goal Status UK:060616) [place] Motor Speech Goal Status SA:931536) [place] Spoken Language Comprehension Current Status [id number]) (None) Spoken Language Comprehension Goal Status [id number]) (None) Spoken Language Comprehension Discharge Status [id number]) (None) Spoken Language Expression Current Status FP:[id number]) (None) Spoken Language Expression Goal Status [id number]) (None) Spoken Language Expression Discharge Status [id number]:1565649)  (None) Attention Current Status [id number]) (None) Attention Goal Status EY:7266000) (None) Attention Discharge Status [id number]) (None) Memory Current Status [id number]:3545582) (None) Memory Goal Status CF:3682075) (None) Memory Discharge Status QC:115444) (None) Voice Current Status [id number]) (None) Voice  Goal Status [phone]) (None) Voice Discharge Status [phone]) (None) Other Speech-Language Pathology Functional Limitation [phone]) (None) Other Speech-Language Pathology Functional Limitation Goal Status [phone]) (None) Other Speech-Language Pathology Functional Limitation Discharge Status [phone]) (None) Herbie Baltimore, MA CCC-SLP [phone] Lynann Beaver 01/10/2016, 10:15 AM                CBC  Recent Labs Lab 01/09/16 0704 01/10/16 0127 01/11/16 0604 01/12/16 0526 01/13/16 0503 01/15/16 1032  WBC 7.2 9.9 9.3 9.0 9.2 11.1*  HGB 10.3* 8.2* 8.5* 8.8* 8.4* 9.1*  HCT 31.9* 25.7* 26.3* 27.5* 26.0* 28.1*  PLT 271 262 282 319 310 358  MCV 85.1 85.1 86.5 85.1 85.8 84.4  MCH 27.5 27.2 28.0 27.2 27.7 27.3  MCHC 32.3 31.9 32.3 32.0 32.3 32.4  RDW 17.1* 17.0* 17.2* 17.0* 17.2* 17.2*  LYMPHSABS 0.6*  --   --   --   --   --   MONOABS 0.3  --   --   --   --   --   EOSABS 0.0  --   --   --   --   --   BASOSABS 0.0  --   --   --   --   --     Chemistries   Recent Labs Lab 01/10/16 0127 01/11/16 0604 01/12/16 0526 01/13/16 0503 01/13/16 1707 01/15/16 1032  NA 137 140 139 139  --  139  K 4.0 3.7 3.5 3.2*  --  4.2  CL 107 110 104 105  --  108  CO2 20* 24 24 26   --  20*  GLUCOSE 351* 184* 204* 133*  --  174*  BUN 23* 20 17 14   --  13  CREATININE 1.20 1.13 1.11 1.09  --  1.13  CALCIUM 9.2 9.1 9.1 8.7*  --  8.8*  MG  --   --   --   --  2.2 1.6*  AST 26 21 24 22   --  29  ALT 18 17 22 17   --  22  ALKPHOS 60 62 61 57  --  60  BILITOT 0.6 0.7 0.8 0.9  --  0.7   ------------------------------------------------------------------------------------------------------------------ estimated creatinine clearance is 52.5  mL/min (by C-G formula based on Cr of 1.13). ------------------------------------------------------------------------------------------------------------------ No results for input(s): HGBA1C in the last 72 hours. ------------------------------------------------------------------------------------------------------------------ No results for input(s): CHOL, HDL, LDLCALC, TRIG, CHOLHDL, LDLDIRECT in the last 72 hours. ------------------------------------------------------------------------------------------------------------------ No results for input(s): TSH, T4TOTAL, T3FREE, THYROIDAB in the last 72 hours.  Invalid input(s): FREET3 ------------------------------------------------------------------------------------------------------------------ No results for input(s): VITAMINB12, FOLATE, FERRITIN, TIBC, IRON, RETICCTPCT in the last 72 hours.  Coagulation profile  Recent Labs Lab 01/09/16 1926  INR 1.48    No results for input(s): DDIMER in the last 72 hours.  Cardiac Enzymes  Recent Labs Lab 01/10/16 0127 01/10/16 0755 01/10/16 1412  TROPONINI 2.98* 2.21* 1.64*   ------------------------------------------------------------------------------------------------------------------ Invalid input(s): POCBNP   CBG:  Recent Labs Lab 01/14/16 1206 01/14/16 1652 01/14/16 2103 01/15/16 0740 01/15/16 1153  GLUCAP 148* 170* 169* 123* 162*       Studies: No results found.    Lab Results  Component Value Date   HGBA1C 6.8* 12/14/2015   Lab Results  Component Value Date   LDLCALC 53 12/14/2015   CREATININE 1.13 01/15/2016       Scheduled Meds: . aspirin  81 mg Oral Daily  . benzonatate  100 mg Oral TID  . clopidogrel  75 mg Oral Daily  . cycloSPORINE  1  drop Both Eyes BID  . feeding supplement (ENSURE ENLIVE)  237 mL Oral BID BM  . furosemide  40 mg Oral Daily  . insulin aspart  0-15 Units Subcutaneous TID WC  . insulin aspart  0-5 Units Subcutaneous QHS   . insulin glargine  25 Units Subcutaneous Daily  . ipratropium-albuterol  3 mL Nebulization TID  . ketotifen  1 drop Both Eyes BID  . latanoprost  1 drop Both Eyes QHS  . metoprolol tartrate  50 mg Oral BID  . oxybutynin  5 mg Oral Daily  . pantoprazole  80 mg Oral Q1200  . rosuvastatin  20 mg Oral q1800  . vancomycin  1,000 mg Intravenous Q12H   Continuous Infusions: . sodium chloride 50 mL/hr at 01/14/16 2100    Principal Problem:   Sepsis ([place]) Active Problems:   CAD (coronary artery disease) of artery bypass graft   Essential hypertension   DM2 (diabetes mellitus, type 2) (HCC)   Aortic valve stenosis   COPD (chronic obstructive pulmonary disease) (HCC)   Stroke, acute, thrombotic ([place])   Acute respiratory failure ([place])   HCAP (healthcare-associated pneumonia)   CKD (chronic kidney disease)    Time spent: 45 minutes   [facility] Pager [phone]. If 7PM-7AM, please contact night-coverage at [email or web address], password [facility] 01/15/2016, 12:47 PM  LOS: 6 days

## 2016-01-16 ENCOUNTER — Other Ambulatory Visit: Payer: Self-pay

## 2016-01-16 DIAGNOSIS — I25700 Atherosclerosis of coronary artery bypass graft(s), unspecified, with unstable angina pectoris: Secondary | ICD-10-CM

## 2016-01-16 DIAGNOSIS — E1101 Type 2 diabetes mellitus with hyperosmolarity with coma: Secondary | ICD-10-CM

## 2016-01-16 DIAGNOSIS — I25810 Atherosclerosis of coronary artery bypass graft(s) without angina pectoris: Secondary | ICD-10-CM

## 2016-01-16 DIAGNOSIS — A4102 Sepsis due to Methicillin resistant Staphylococcus aureus: Secondary | ICD-10-CM

## 2016-01-16 DIAGNOSIS — E1100 Type 2 diabetes mellitus with hyperosmolarity without nonketotic hyperglycemic-hyperosmolar coma (NKHHC): Secondary | ICD-10-CM

## 2016-01-16 DIAGNOSIS — I200 Unstable angina: Secondary | ICD-10-CM

## 2016-01-16 LAB — GLUCOSE, CAPILLARY
GLUCOSE-CAPILLARY: 195 mg/dL — AB (ref 65–99)
Glucose-Capillary: 152 mg/dL — ABNORMAL HIGH (ref 65–99)
Glucose-Capillary: 189 mg/dL — ABNORMAL HIGH (ref 65–99)
Glucose-Capillary: 162 mg/dL — ABNORMAL HIGH (ref 65–99)

## 2016-01-16 LAB — CBC
HCT: 26.3 % — ABNORMAL LOW (ref 39.0–52.0)
Hemoglobin: 8.4 g/dL — ABNORMAL LOW (ref 13.0–17.0)
MCH: 27.5 pg (ref 26.0–34.0)
MCHC: 31.9 g/dL (ref 30.0–36.0)
MCV: 86.2 fL (ref 78.0–100.0)
PLATELETS: 350 10*3/uL (ref 150–400)
RBC: 3.05 MIL/uL — ABNORMAL LOW (ref 4.22–5.81)
RDW: 17.3 % — AB (ref 11.5–15.5)
WBC: 11.2 10*3/uL — AB (ref 4.0–10.5)

## 2016-01-16 LAB — VANCOMYCIN, TROUGH
VANCOMYCIN TR: 31 ug/mL — AB (ref 10.0–20.0)
VANCOMYCIN TR: 18 ug/mL (ref 10.0–20.0)

## 2016-01-16 MED ORDER — VANCOMYCIN HCL IN DEXTROSE 1-5 GM/200ML-% IV SOLN: 1000.0000 mg | Freq: Two times a day (BID) | INTRAVENOUS | Status: AC

## 2016-01-16 MED ORDER — INSULIN GLARGINE 100 UNIT/ML ~~LOC~~ SOLN: 25.0000 [IU] | Freq: Every day | SUBCUTANEOUS | Status: AC

## 2016-01-16 MED ORDER — HYDROCODONE-ACETAMINOPHEN 5-325 MG PO TABS: 1.0000 | ORAL_TABLET | ORAL | Status: DC | PRN

## 2016-01-16 MED ORDER — HYDROCODONE-HOMATROPINE 5-1.5 MG/5ML PO SYRP: 5.0000 mL | ORAL_SOLUTION | Freq: Four times a day (QID) | ORAL | Status: DC | PRN

## 2016-01-16 MED ORDER — INSULIN ASPART 100 UNIT/ML ~~LOC~~ SOLN: SUBCUTANEOUS | Status: AC

## 2016-01-16 NOTE — Progress Notes (Signed)
Pharmacy Antibiotic Follow-up Note  Assessment:  Douglas Carrillo is a 82 y.o. year-old male admitted on 01/09/2016.  The patient is currently on day 8 of Vancomycin for MRSA bacteremia. A Vancomycin trough this morning resulted as 75mcg/ml- noted the dose was hung at 0555, level drawn at 0623.  Plan: - Continue Vancomycin at 1g IV q12h - Repeat VT tonight at 1730 to ensure level from this morning is actually falsely elevated - Will continue to follow renal function, culture results, LOT, and antibiotic de-escalation plans   Temp (24hrs), Avg:98.3 F (36.8 C), Min:97.7 F (36.5 C), Max:98.6 F (37 C)   Recent Labs Lab 01/11/16 0604 01/12/16 0526 01/13/16 0503 01/15/16 1032 01/16/16 0623  WBC 9.3 9.0 9.2 11.1* 11.2*     Recent Labs Lab 01/10/16 0127 01/11/16 0604 01/12/16 0526 01/13/16 0503 01/15/16 1032  CREATININE 1.20 1.13 1.11 1.09 1.13   Estimated Creatinine Clearance: 52.5 mL/min (by C-G formula based on Cr of 1.13).    Allergies  Allergen Reactions  . Mold Extract [Trichophyton] Anaphylaxis  . Lipitor [Atorvastatin] Other (See Comments)    Weakness in legs/myalgias    Antimicrobials this admission: Vanc 1/17 >>  Cefepime 1/17 >> 1/19 Cefazolin 1/19 >> 1/20   Levels/dose changes this admission: 1/21 VT 14 mcg/ml >> increased to 1g/12h 1/24 VT 69mcg/ml >> appears level drawn after dose given  Microbiology results: 1/17 BCx2>> MRSA (Vanc MIC < 0.5) 1/17 UC>> NGF  1/17 MRSA PCR: positive  1/20 BCx >> ngtd  Thank you for allowing pharmacy to be a part of this patient's care.  Eric Morganti D. Rahel Carlton, PharmD, BCPS Clinical Pharmacist Pager: [phone] 01/16/2016 9:30 AM

## 2016-01-16 NOTE — Progress Notes (Signed)
Patient will DC to SNF today. Representative from [facility] will be at hospital at 2:30pm to assess patient for discharge. [facility] stated [facility] would be able to take patient with a peripheral IV.  CSW to continue to follow for discharge.  Percell Locus Roderick Sweezy LCSWA [phone]

## 2016-01-16 NOTE — Clinical Social Work Placement (Signed)
   CLINICAL SOCIAL WORK PLACEMENT  NOTE  Date:  01/16/2016  Patient Details  Name: Douglas Carrillo MRN: [id number] Date of Birth: 09-24-1928  Clinical Social Work is seeking post-discharge placement for this patient at the [place] level of care (*CSW will initial, date and re-position this form in  chart as items are completed):  Yes   Patient/family provided with [facility] Department's list of facilities offering this level of care within the geographic area requested by the patient (or if unable, by the patient's family).  Yes   Patient/family informed of their freedom to choose among providers that offer the needed level of care, that participate in Medicare, Medicaid or managed care program needed by the patient, have an available bed and are willing to accept the patient.  Yes   Patient/family informed of [facility]'s ownership interest in [facility] and [facility], as well as of the fact that they are under no obligation to receive care at these facilities.  PASRR submitted to EDS on 01/15/16     PASRR number received on 01/15/16     Existing PASRR number confirmed on       FL2 transmitted to all facilities in geographic area requested by pt/family on 01/15/16     FL2 transmitted to all facilities within larger geographic area on       Patient informed that his/her managed care company has contracts with or will negotiate with certain facilities, including the following:            Patient/family informed of bed offers received.  Patient chooses bed at [facility]     Physician recommends and patient chooses bed at      Patient to be transferred to [facility] on 01/16/16.  Patient to be transferred to facility by PTAR     Patient family notified on 01/16/16 of transfer.  Name of family member notified:  [name], daughter     PHYSICIAN       Additional Comment:     _______________________________________________ Benard Halsted, [place] 01/16/2016, 4:39 PM

## 2016-01-16 NOTE — Progress Notes (Signed)
Speech Language Pathology Treatment: Dysphagia  Patient Details Name: Douglas Carrillo MRN: [id number] DOB: [birth year] Today's Date: 01/16/2016 Time: [id number] SLP Time Calculation (min) (ACUTE ONLY): 13 min  Assessment / Plan / Recommendation Clinical Impression  Pt seen to assess dietary tolerance of advancement to thin liquids.  Note pt today with shallow and rapid breathing rate and remains on 4 liters oxygen.  Pt denies worsening coughing or tolerance of thin vs nectar liquids.    Excessive coughing heard from outside the room prior to session which pt reports was not coorelated to po intake.  He stated he had not had any water today yet and requested water.  Timely swallow noted with straw bolus of water followed by exhalation post=swallow.  Delayed nonproductive cough noted.    Pt reports improved intake yesterday consuming cod, broccoli with good tolerance.  He did question if he was febrile in the middle of the night but did not call to request temperature checked - advised him to call RN with concerns.   Pt denies reflux symptoms in the middle of the night.  SLP wrote pt's questions for MD to help him recall including concern for appetite changes and weight loss.  Pt read questions aloud and they were placed within his view.    Will continue to follow to aid dysphagia mitigation but suspect pt may episodically aspirate due to his dyspnea.    Advised pt to continue to use spirometer and take deeper breaths with slower rate.  Recommend continue diet with precautions.     HPI HPI: 82 y.o. 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 He is admitted for sepsis secondary to PNA (RML and RLL per imaging report).      SLP Plan  Continue with current plan of care     Recommendations  Diet recommendations: Dysphagia 3 (mechanical soft);Thin liquid Liquids provided via: Cup;Straw Medication Administration: Whole meds with  liquid Supervision: Patient able to self feed Compensations: Slow rate;Small sips/bites;Follow solids with liquid (start meals with liquids) Postural Changes and/or Swallow Maneuvers: Seated upright 90 degrees;Upright 30-60 min after meal             Oral Care Recommendations: Oral care BID Follow up Recommendations: 24 hour supervision/assistance Plan: Continue with current plan of care     [place], [place] [facility] SLP [phone]  01/16/2016, 8:38 AM

## 2016-01-16 NOTE — Progress Notes (Signed)
Patient will DC to: [facility] Anticipated DC date: 01/16/16 Family notified: Daughter, Joetta Manners by: PTAR  CSW signing off.  Cedric Fishman, [place] Social Worker [phone]

## 2016-01-16 NOTE — Consult Note (Signed)
   [facility] Inpatient Consult   01/16/2016  Douglas Carrillo 1928-06-04 [id number] Patient evaluated for community based chronic disease Carrillo services with [place] [facility] as a benefit of patient's Medicare Insurance. Spoke with patient and his daughter Douglas Carrillo  at bedside to explain [place] Carrillo services.Consent form signed.  Daughter states patient might go to a skilled facility for rehab.  She states he is planning to return home after his therapy and antibiotics.   Patient will receive post hospital discharge call and will be evaluated for monthly home visits for assessments and disease process education.  Left contact information and [facility] literature at bedside. Made Inpatient Case Manager aware that [facility] following. Of note, [facility] Carrillo services does not replace or interfere with any services that are arranged by inpatient case Carrillo or social work.  For additional questions or referrals please contact:   Douglas Brood, RN BSN [facility] Liaison  [phone] business mobile phone Toll free office [phone]

## 2016-01-16 NOTE — Discharge Summary (Signed)
Physician Discharge Summary  Benjamen Koelling MRN: [id number] DOB/AGE: 1928/05/25 82 y.o.  PCP: Limmie Patricia, MD   Admit date: 01/09/2016 Discharge date: 01/16/2016  Discharge Diagnoses:     Principal Problem:   Sepsis ([place]) Active Problems:   CAD (coronary artery disease) of artery bypass graft   Essential hypertension   DM2 (diabetes mellitus, type 2) (HCC)   Aortic valve stenosis   COPD (chronic obstructive pulmonary disease) (HCC)   Stroke, acute, thrombotic ([place])   Acute respiratory failure ([place])   HCAP (healthcare-associated pneumonia)   CKD (chronic kidney disease)    Follow-up recommendations Follow-up with PCP in 3-5 days , including all  additional recommended appointments as below Follow-up CBC, CMP in 3-5 days Continue with vancomycin through February 2nd will need to monitor with vancomycin per pharmacy.   Diet recommendations: Dysphagia 3 (mechanical soft);Thin liquid Liquids provided via: Cup;Straw Medication Administration: Whole meds with liquid Supervision: Patient able to self feed Compensations: Slow rate;Small sips/bites;Follow solids with liquid (start meals with liquids) Postural Changes and/or Swallow Maneuvers: Seated upright 90 degrees;Upright 30-60 min after meal   Medication List    STOP taking these medications        isosorbide mononitrate 60 MG 24 hr tablet  Commonly known as:  IMDUR     predniSONE 10 MG tablet  Commonly known as:  DELTASONE     VICTOZA 18 MG/3ML Sopn  Generic drug:  Liraglutide      TAKE these medications        AEROCHAMBER PLUS FLO-VU Misc     aspirin 81 MG tablet  Take 81 mg by mouth daily.     b complex vitamins capsule  Take 1 capsule by mouth daily.     B-D ULTRAFINE III SHORT PEN 31G X 8 MM Misc  Generic drug:  Insulin Pen Needle     benzonatate 100 MG capsule  Commonly known as:  TESSALON  Take 100 mg by mouth 3 (three) times daily.     CENTRUM SILVER ULTRA MENS PO  Take 1 tablet by  mouth daily.     clopidogrel 75 MG tablet  Commonly known as:  PLAVIX  TAKE 1 TABLET DAILY     CRESTOR 20 MG tablet  Generic drug:  rosuvastatin  Take 20 mg by mouth daily.     furosemide 40 MG tablet  Commonly known as:  LASIX  TAKE 1 TABLET DAILY     HYDROcodone-acetaminophen 5-325 MG tablet  Commonly known as:  NORCO/VICODIN  Take 1-2 tablets by mouth every 4 (four) hours as needed for moderate pain.     HYDROcodone-homatropine 5-1.5 MG/5ML syrup  Commonly known as:  HYCODAN  Take 5 mLs by mouth every 6 (six) hours as needed for cough.     insulin aspart 100 UNIT/ML injection  Commonly known as:  novoLOG  Inject 0-9 Units into the skin 3 (three) times daily with meals.     insulin glargine 100 UNIT/ML injection  Commonly known as:  LANTUS  Inject 0.25 mLs (25 Units total) into the skin daily.     latanoprost 0.005 % ophthalmic solution  Commonly known as:  XALATAN  Place 1 drop into both eyes at bedtime.     metoprolol succinate 100 MG 24 hr tablet  Commonly known as:  TOPROL-XL  Take 1/2 tablet daily     NEXIUM 40 MG capsule  Generic drug:  esomeprazole  Take 40 mg by mouth daily.     ONETOUCH DELICA LANCETS 29B Misc  1 each by Other route 2 (two) times daily. Use 1 lancet each to check glucose bid     ONETOUCH VERIO test strip  Generic drug:  glucose blood  1 strip by Other route 2 (two) times daily. Use 1 strip to check glucose bid     oxybutynin 5 MG 24 hr tablet  Commonly known as:  DITROPAN-XL  Take 5 mg by mouth daily.     pioglitazone 45 MG tablet  Commonly known as:  ACTOS  Take 45 mg by mouth daily.     potassium chloride SA 20 MEQ tablet  Commonly known as:  KLOR-CON M20  Take 3 tablets daily.     PROAIR HFA 108 (90 Base) MCG/ACT inhaler  Generic drug:  albuterol  Inhale into the lungs as needed.     RESTASIS 0.05 % ophthalmic emulsion  Generic drug:  cycloSPORINE  Place 1 drop into both eyes 2 (two) times daily.     STIOLTO RESPIMAT  2.5-2.5 MCG/ACT Aers  Generic drug:  Tiotropium Bromide-Olodaterol  USE 2 INHALATIONS ORALLY   DAILY     THERATEARS ALLERGY 0.025 % ophthalmic solution  Generic drug:  ketotifen  Place 1 drop into both eyes 2 (two) times daily.     vancomycin 1 GM/200ML Soln  Commonly known as:  VANCOCIN  Inject 200 mLs (1,000 mg total) into the vein every 12 (twelve) hours.     vitamin C 500 MG tablet  Commonly known as:  ASCORBIC ACID  Take 500 mg by mouth 2 (two) times daily.     Vitamin D (Ergocalciferol) 50000 units Caps capsule  Commonly known as:  DRISDOL  Take 50,000 Units by mouth every 14 (fourteen) days. Every other week on Sunday     vitamin E 400 UNIT capsule  Take 400 Units by mouth daily.     ZETIA 10 MG tablet  Generic drug:  ezetimibe  Take 10 mg by mouth daily.         Discharge Condition: Stable Discharge Instructions       Discharge Instructions    Diet - low sodium heart healthy    Complete by:  As directed      Increase activity slowly    Complete by:  As directed            Allergies  Allergen Reactions  . Mold Extract [Trichophyton] Anaphylaxis  . Lipitor [Atorvastatin] Other (See Comments)    Weakness in legs/myalgias      Disposition: 01-Home or Self Care   Consults:  Infectious disease   Significant Diagnostic Studies:  Dg Chest 2 View  01/09/2016  CLINICAL DATA:  Cough and shortness of breath for a week. EXAM: CHEST  2 VIEW COMPARISON:  January 04, 2016. FINDINGS: Stable cardiomegaly. Status post coronary artery bypass graft. No pneumothorax or significant pleural effusion is noted. There is interval development of right middle and lower lobe airspace opacities concerning for pneumonia. Left lung is clear. Bony thorax is unremarkable. IMPRESSION: Findings consistent with right middle and lower lobe pneumonia. Continued radiographic follow-up is recommended to ensure resolution. Electronically Signed   By: Marijo Conception, M.D.   On:  01/09/2016 08:08   Dg Chest 2 View  01/04/2016  CLINICAL DATA:  Wheezing, cough, congestion for 1 week EXAM: CHEST  2 VIEW COMPARISON:  12/13/2015 FINDINGS: Cardiomediastinal silhouette is stable. Central mild vascular congestion without convincing pulmonary edema. Status post CABG. Mild infrahilar bronchitic changes. No segmental infiltrate. Mild hyperinflation. IMPRESSION: Central mild  vascular congestion without convincing pulmonary edema. Mild infrahilar bronchitic changes. Status post CABG. Mild hyperinflation. Electronically Signed   By: Lahoma Crocker M.D.   On: 01/04/2016 15:52   Dg Swallowing Func-speech Pathology  01/10/2016  Objective Swallowing Evaluation:   Patient Details Name: Markel Kurtenbach MRN: [id number] Date of Birth: 20-Feb-1928 Today's Date: 01/10/2016 Time: SLP Start Time (ACUTE ONLY): 0930-SLP Stop Time (ACUTE ONLY): 0945 SLP Time Calculation (min) (ACUTE ONLY): 15 min Past Medical History: Past Medical History Diagnosis Date . Hypertension  . High cholesterol  . Prostate cancer ([place])  . CAD (coronary artery disease)  . OSA (obstructive sleep apnea)  . COPD (chronic obstructive pulmonary disease) (HCC)    MODERATE . Diabetes mellitus ([place])  . GERD (gastroesophageal reflux disease)  . Vertigo  . Arthritis    OA . Anemia  . CHF (congestive heart failure) (HCC)    grade 2 diastolic dysfuction . CKD (chronic kidney disease)  . Stroke (cerebrum) ([place])    11/2015 . HCAP (healthcare-associated pneumonia)    12/2015 Past Surgical History: Past Surgical History Procedure Laterality Date . Coronary artery bypass graft  03/29/1999   x 4:  LIMA to the LAD,SVG to the diagonal branch of the LAD,SVG to the intermediate coronary artery & SVG to the posterior descending branch of the RCA. . Colon surgery     x 4 . Nm myocar perf wall motion  04/28/2012   Low Risk Scan . Prostate surgery   . Cardiac catheterization   . Cardiac catheterization N/A 12/12/2015   Procedure: Right/Left Heart Cath and Coronary/Graft  Angiography;  Surgeon: Troy Sine, MD;  Location: [facility];  Service: Cardiovascular;  Laterality: N/A; . Peripheral vascular catheterization N/A 12/12/2015   Procedure: Aortic Arch Angiography;  Surgeon: Troy Sine, MD;  Location: [facility];  Service: Cardiovascular;  Laterality: N/A; . Eye surgery   HPI: 82 y.o. 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 He is admitted for sepsis secondary to PNA (RML and RLL per imaging report). Subjective: pt alert, denies any difficulties with swallowing Assessment / Plan / Recommendation CHL IP CLINICAL IMPRESSIONS 01/10/2016 Therapy Diagnosis Mild oral phase dysphagia;Suspected primary esophageal dysphagia Clinical Impression Pt demonstrates no significant oropharyngeal dysphagia worrisome for aspiration risk. There is slow bolus formation with occasional premature spillage to valleculae. Initial sips of nectar showed a delay in swallow initiation, but as study progressed pt was taking consecutive sips of thin liquids with timely swallow. Strength WNL, no penetration or aspiration occurred despite attempt to tax system. Pt did have some delayed coughing; esophageal sweep showed appearence of significant stasis with purees which did not clear with sips of liquid. Pill transited with multiple bites and sips. Discussed esophageal precautions. Recommend regular diet and thin liquids. SLP will f/u for education and tolerance at bedside.  Impact on safety and function Moderate aspiration risk   CHL IP TREATMENT RECOMMENDATION 01/10/2016 Treatment Recommendations Therapy as outlined in treatment plan below   No flowsheet data found. CHL IP DIET RECOMMENDATION 01/10/2016 SLP Diet Recommendations Regular solids;Thin liquid Liquid Administration via Cup;Straw Medication Administration Whole meds with puree Compensations Slow rate;Small sips/bites;Follow solids with liquid Postural Changes Remain  semi-upright after after feeds/meals (Comment);Seated upright at 90 degrees   CHL IP OTHER RECOMMENDATIONS 01/10/2016 Recommended Consults -- Oral Care Recommendations Patient independent with oral care Other Recommendations --   CHL IP FOLLOW UP RECOMMENDATIONS 01/10/2016 Follow up Recommendations  None   CHL IP FREQUENCY AND DURATION 01/10/2016 Speech Therapy Frequency (ACUTE ONLY) min 1 x/week Treatment Duration 1 week      CHL IP ORAL PHASE 01/10/2016 Oral Phase Impaired Oral - Pudding Teaspoon -- Oral - Pudding Cup -- Oral - Honey Teaspoon -- Oral - Honey Cup -- Oral - Nectar Teaspoon -- Oral - Nectar Cup Delayed oral transit;Premature spillage Oral - Nectar Straw Delayed oral transit;Premature spillage Oral - Thin Teaspoon -- Oral - Thin Cup Premature spillage Oral - Thin Straw Premature spillage Oral - Puree WFL Oral - Mech Soft -- Oral - Regular WFL Oral - Multi-Consistency -- Oral - Pill -- Oral Phase - Comment --  CHL IP PHARYNGEAL PHASE 01/10/2016 Pharyngeal Phase Impaired Pharyngeal- Pudding Teaspoon -- Pharyngeal -- Pharyngeal- Pudding Cup -- Pharyngeal -- Pharyngeal- Honey Teaspoon -- Pharyngeal -- Pharyngeal- Honey Cup -- Pharyngeal -- Pharyngeal- Nectar Teaspoon -- Pharyngeal -- Pharyngeal- Nectar Cup Delayed swallow initiation-vallecula Pharyngeal -- Pharyngeal- Nectar Straw Delayed swallow initiation-pyriform sinuses Pharyngeal -- Pharyngeal- Thin Teaspoon -- Pharyngeal -- Pharyngeal- Thin Cup WFL Pharyngeal -- Pharyngeal- Thin Straw WFL Pharyngeal -- Pharyngeal- Puree WFL Pharyngeal -- Pharyngeal- Mechanical Soft -- Pharyngeal -- Pharyngeal- Regular WFL Pharyngeal -- Pharyngeal- Multi-consistency -- Pharyngeal -- Pharyngeal- Pill WFL Pharyngeal -- Pharyngeal Comment --  CHL IP CERVICAL ESOPHAGEAL PHASE 01/10/2016 Cervical Esophageal Phase Impaired Pudding Teaspoon -- Pudding Cup -- Honey Teaspoon -- Honey Cup -- Nectar Teaspoon -- Nectar Cup -- Nectar Straw -- Thin Teaspoon -- Thin Cup -- Thin Straw --  Puree -- Mechanical Soft -- Regular -- Multi-consistency -- Pill -- Cervical Esophageal Comment -- CHL IP GO 12/14/2015 Functional Assessment Tool Used skilled clinical judgement Functional Limitations Motor speech Swallow Current Status [phone]) (None) Swallow Goal Status (J2426) (None) Swallow Discharge Status (S3419) (None) Motor Speech Current Status (Q2229) [place] Motor Speech Goal Status (N9892) [place] Motor Speech Goal Status (J1941) [place] Spoken Language Comprehension Current Status (D4081) (None) Spoken Language Comprehension Goal Status (K4818) (None) Spoken Language Comprehension Discharge Status (H6314) (None) Spoken Language Expression Current Status (H7026) (None) Spoken Language Expression Goal Status (V7858) (None) Spoken Language Expression Discharge Status [phone]) (None) Attention Current Status (X4128) (None) Attention Goal Status (N8676) (None) Attention Discharge Status (H2094) (None) Memory Current Status (B0962) (None) Memory Goal Status (E3662) (None) Memory Discharge Status (H4765) (None) Voice Current Status (Y6503) (None) Voice Goal Status (T4656) (None) Voice Discharge Status (C1275) (None) Other Speech-Language Pathology Functional Limitation (T7001) (None) Other Speech-Language Pathology Functional Limitation Goal Status (V4944) (None) Other Speech-Language Pathology Functional Limitation Discharge Status [phone]) (None) Herbie Baltimore, MA CCC-SLP [phone] Lynann Beaver 01/10/2016, 10:15 AM                 Filed Weights   01/13/16 0500 01/14/16 0533 01/16/16 0439  Weight: 86.365 kg (190 lb 6.4 oz) 86.365 kg (190 lb 6.4 oz) 83.2 kg (183 lb 6.8 oz)     Microbiology: Recent Results (from the past 240 hour(s))  Culture, blood (routine x 2)     Status: None   Collection Time: 01/09/16  7:05 AM  Result Value Ref Range Status   Specimen Description BLOOD RIGHT ANTECUBITAL  Final   Special Requests BOTTLES DRAWN AEROBIC AND ANAEROBIC 5MLS  Final   Culture  Setup Time   Final     GRAM POSITIVE COCCI IN CLUSTERS IN BOTH AEROBIC AND ANAEROBIC BOTTLES CRITICAL RESULT CALLED TO, READ BACK BY AND VERIFIED WITH: J. [place] [id number] BY Craig Guess  Culture METHICILLIN RESISTANT STAPHYLOCOCCUS AUREUS  Final   Report Status 01/12/2016 FINAL  Final   Organism ID, Bacteria METHICILLIN RESISTANT STAPHYLOCOCCUS AUREUS  Final      Susceptibility   Methicillin resistant staphylococcus aureus - MIC*    CIPROFLOXACIN <=0.5 SENSITIVE Sensitive     ERYTHROMYCIN <=0.25 SENSITIVE Sensitive     GENTAMICIN <=0.5 SENSITIVE Sensitive     OXACILLIN >=4 RESISTANT Resistant     TETRACYCLINE <=1 SENSITIVE Sensitive     VANCOMYCIN <=0.5 SENSITIVE Sensitive     TRIMETH/SULFA <=10 SENSITIVE Sensitive     CLINDAMYCIN <=0.25 SENSITIVE Sensitive     RIFAMPIN <=0.5 SENSITIVE Sensitive     Inducible Clindamycin NEGATIVE Sensitive     * METHICILLIN RESISTANT STAPHYLOCOCCUS AUREUS  Culture, blood (routine x 2)     Status: None   Collection Time: 01/09/16  7:20 AM  Result Value Ref Range Status   Specimen Description BLOOD LEFT ANTECUBITAL  Final   Special Requests BOTTLES DRAWN AEROBIC AND ANAEROBIC 10MLS  Final   Culture  Setup Time   Final    GRAM POSITIVE COCCI IN CLUSTERS IN BOTH AEROBIC AND ANAEROBIC BOTTLES CRITICAL RESULT CALLED TO, READ BACK BY AND VERIFIED WITH: J MILLER @0236  01/10/16 MKELLY    Culture   Final    STAPHYLOCOCCUS AUREUS SUSCEPTIBILITIES PERFORMED ON PREVIOUS CULTURE WITHIN THE LAST 5 DAYS.    Report Status 01/12/2016 FINAL  Final  Respiratory virus panel     Status: None   Collection Time: 01/09/16  9:20 AM  Result Value Ref Range Status   Source - RVPAN NASAL SWAB  Corrected   Respiratory Syncytial Virus A Negative Negative Final   Respiratory Syncytial Virus B Negative Negative Final   Influenza A Negative Negative Final   Influenza B Negative Negative Final   Parainfluenza 1 Negative Negative Final   Parainfluenza 2 Negative Negative Final    Parainfluenza 3 Negative Negative Final   Metapneumovirus Negative Negative Final   Rhinovirus Negative Negative Final   Adenovirus Negative Negative Final    Comment: (NOTE) faxed results 01/17/187 @ 1926 lr Performed At: [facility] [address] [place], [place] [id number] Lindon Romp MD YO:[id number]   Urine culture     Status: None   Collection Time: 01/09/16 11:46 AM  Result Value Ref Range Status   Specimen Description URINE, CATHETERIZED  Final   Special Requests NONE  Final   Culture NO GROWTH 1 DAY  Final   Report Status 01/10/2016 FINAL  Final  MRSA PCR Screening     Status: Abnormal   Collection Time: 01/09/16  3:01 PM  Result Value Ref Range Status   MRSA by PCR POSITIVE (A) NEGATIVE Final    Comment:        The GeneXpert MRSA Assay (FDA approved for NASAL specimens only), is one component of a comprehensive MRSA colonization surveillance program. It is not intended to diagnose MRSA infection nor to guide or monitor treatment for MRSA infections. RESULT CALLED TO, READ BACK BY AND VERIFIED WITH: Jeannene Patella RN 17:15 01/09/16 ([email or web address])   Culture, blood (routine x 2)     Status: None (Preliminary result)   Collection Time: 01/12/16  5:26 AM  Result Value Ref Range Status   Specimen Description BLOOD BLOOD RIGHT HAND  Final   Special Requests IN PEDIATRIC BOTTLE 2CC  Final   Culture NO GROWTH 4 DAYS  Final   Report Status PENDING  Incomplete  Culture, blood (routine x 2)  Status: None (Preliminary result)   Collection Time: 01/12/16  5:31 AM  Result Value Ref Range Status   Specimen Description BLOOD BLOOD LEFT HAND  Final   Special Requests BOTTLES DRAWN AEROBIC ONLY 5CC  Final   Culture NO GROWTH 4 DAYS  Final   Report Status PENDING  Incomplete       Blood Culture    Component Value Date/Time   SDES BLOOD BLOOD LEFT HAND 01/12/2016 0531   SPECREQUEST BOTTLES DRAWN AEROBIC ONLY 5CC 01/12/2016 0531   CULT NO GROWTH 4 DAYS 01/12/2016  0531   REPTSTATUS PENDING 01/12/2016 0531      Labs: Results for orders placed or performed during the hospital encounter of 01/09/16 (from the past 48 hour(s))  Glucose, capillary     Status: Abnormal   Collection Time: 01/14/16  4:52 PM  Result Value Ref Range   Glucose-Capillary 170 (H) 65 - 99 mg/dL  Glucose, capillary     Status: Abnormal   Collection Time: 01/14/16  9:03 PM  Result Value Ref Range   Glucose-Capillary 169 (H) 65 - 99 mg/dL  Glucose, capillary     Status: Abnormal   Collection Time: 01/15/16  7:40 AM  Result Value Ref Range   Glucose-Capillary 123 (H) 65 - 99 mg/dL  Comprehensive metabolic panel     Status: Abnormal   Collection Time: 01/15/16 10:32 AM  Result Value Ref Range   Sodium 139 135 - 145 mmol/L   Potassium 4.2 3.5 - 5.1 mmol/L   Chloride 108 101 - 111 mmol/L   CO2 20 (L) 22 - 32 mmol/L   Glucose, Bld 174 (H) 65 - 99 mg/dL   BUN 13 6 - 20 mg/dL   Creatinine, Ser 1.13 0.61 - 1.24 mg/dL   Calcium 8.8 (L) 8.9 - 10.3 mg/dL   Total Protein 6.5 6.5 - 8.1 g/dL   Albumin 2.2 (L) 3.5 - 5.0 g/dL   AST 29 15 - 41 U/L   ALT 22 17 - 63 U/L   Alkaline Phosphatase 60 38 - 126 U/L   Total Bilirubin 0.7 0.3 - 1.2 mg/dL   GFR calc non Af Amer 57 (L) >60 mL/min   GFR calc Af Amer >60 >60 mL/min    Comment: (NOTE) The eGFR has been calculated using the CKD EPI equation. This calculation has not been validated in all clinical situations. eGFR's persistently <60 mL/min signify possible Chronic Kidney Disease.    Anion gap 11 5 - 15  CBC     Status: Abnormal   Collection Time: 01/15/16 10:32 AM  Result Value Ref Range   WBC 11.1 (H) 4.0 - 10.5 K/uL   RBC 3.33 (L) 4.22 - 5.81 MIL/uL   Hemoglobin 9.1 (L) 13.0 - 17.0 g/dL   HCT 28.1 (L) 39.0 - 52.0 %   MCV 84.4 78.0 - 100.0 fL   MCH 27.3 26.0 - 34.0 pg   MCHC 32.4 30.0 - 36.0 g/dL   RDW 17.2 (H) 11.5 - 15.5 %   Platelets 358 150 - 400 K/uL  Magnesium     Status: Abnormal   Collection Time: 01/15/16  10:32 AM  Result Value Ref Range   Magnesium 1.6 (L) 1.7 - 2.4 mg/dL  Glucose, capillary     Status: Abnormal   Collection Time: 01/15/16 11:53 AM  Result Value Ref Range   Glucose-Capillary 162 (H) 65 - 99 mg/dL  Glucose, capillary     Status: Abnormal   Collection Time: 01/15/16  5:35 PM  Result Value Ref Range   Glucose-Capillary 178 (H) 65 - 99 mg/dL  Glucose, capillary     Status: Abnormal   Collection Time: 01/15/16  9:22 PM  Result Value Ref Range   Glucose-Capillary 182 (H) 65 - 99 mg/dL  Vancomycin, trough     Status: Abnormal   Collection Time: 01/16/16  6:23 AM  Result Value Ref Range   Vancomycin Tr 31 (HH) 10.0 - 20.0 ug/mL    Comment: CRITICAL RESULT CALLED TO, READ BACK BY AND VERIFIED WITH: J ZHU,RN AT 7510 ON 1.24.17 BY W JOHNSON   CBC     Status: Abnormal   Collection Time: 01/16/16  6:23 AM  Result Value Ref Range   WBC 11.2 (H) 4.0 - 10.5 K/uL   RBC 3.05 (L) 4.22 - 5.81 MIL/uL   Hemoglobin 8.4 (L) 13.0 - 17.0 g/dL   HCT 26.3 (L) 39.0 - 52.0 %   MCV 86.2 78.0 - 100.0 fL   MCH 27.5 26.0 - 34.0 pg   MCHC 31.9 30.0 - 36.0 g/dL   RDW 17.3 (H) 11.5 - 15.5 %   Platelets 350 150 - 400 K/uL  Glucose, capillary     Status: Abnormal   Collection Time: 01/16/16  8:08 AM  Result Value Ref Range   Glucose-Capillary 152 (H) 65 - 99 mg/dL  Glucose, capillary     Status: Abnormal   Collection Time: 01/16/16 12:02 PM  Result Value Ref Range   Glucose-Capillary 195 (H) 65 - 99 mg/dL     Lipid Panel     Component Value Date/Time   CHOL 105 12/14/2015 0354   TRIG 76 12/14/2015 0354   HDL 37* 12/14/2015 0354   CHOLHDL 2.8 12/14/2015 0354   VLDL 15 12/14/2015 0354   LDLCALC 53 12/14/2015 0354     Lab Results  Component Value Date   HGBA1C 6.8* 12/14/2015     Lab Results  Component Value Date   LDLCALC 53 12/14/2015   CREATININE 1.13 01/15/2016     Domenico Achord is a 82 y.o. male with history of HTN, CAD, OSA, COPD- oxygen dependent, CKD, severe AS,  recent rehab stay for CVA 12/12/2015 who was admitted on 01/09/16 with sepsis secondary to HCAP and question of aspiration. He was started on antibiotics for treatment and MBS done revealing delay in swallow but no signs of aspiration. He was placed on regular diet. Blood culture done X 2 done and one positive for MRSA. He had elevated troponins felt to be due to demand ischemia as patient without angina. Cardiology following for input and fluid overload treated with lasix prn. ID consulted for input and recommended continuing IV Vanc/Cefazolin for now and TEE for work up. TEE without vegetations    HOSPITAL COURSE:   #1. Sepsis with staph aureus bacteremia, and healthcare associated pneumonia ,right middle and lower lobe pneumonia Doubt it is Staph aureus pneumonia. Upon admission Max temp 102.8, lactic acid 2.54, heart heart rate 112 respiratory rate 30 blood pressure stable. ABG with pH of 7.47, PCO2 27.4, PO2 79.0, bicarbonate 19.8. He received 2 L of fluid in the emergency department and antibiotics, Initial lactic acid 1.45, pro calcitonin 5.41, influenza PCR negative, initiated, Antimicrobials during this admission as below Stable on 2 L of nasal cannula Speech therapy evaluation recommends dysphagia diet with nectar thick liquids TEE showed No echo evidence for endocarditis. Heavy aortic valve degenerative calcification could mask a small vegetation. Consider repeat TEE in 2 weeks if there is persistent bacteremia  PICC line declined by patient  Patient continued to have low intermittent fevers, repeat blood culture from 1/20 negative,   family refusing PICC line, patient will be discharged to SNF with peripheral  IV Antimicrobials this admission: Vanc 1/17 >> continue until 01/25/16 Cefepime 1/17 >> 1/19 Cefazolin 1/19 >> 1/20    #2. Acute respiratory failure with hypoxia likely related to pneumonia. ABG as noted above. Oxygen saturation level 85% on room air. Improved now 95% on 2  L nasal cannula. He wears oxygen at home only at night. Recently completed a prednisone taper provided by PCP -Continue oxygen supplementation -Nebulizers -Antibiotics per healthcare associated pneumonia protocol -Wean oxygen as able   #3. Healthcare associated pneumonia. Chest x-ray consistent with right middle and lower lobe pneumonia. Some concern for aspiration. See #1   #4. Recent history of stroke.  Spent 1 week inpatient rehabilitation.improving right upper and right lower extremity weakness. -Appears stable at baseline -Physical therapy recommends SNF -Continue Plavix/aspirin  #5. Hypertension. Blood pressure stable, continue Lasix, Toprol. Hold Imdur  #6. Diabetes. Improving  Continue Lantus 25 units in addition to SSI Hold Actos, victoza  #7. Chronic kidney disease stage II. Creatinine 1.31 on admission. This appears to be close to his baseline -Monitor intake and output -Check in the morning  #8. CAD. With abnormal troponin, likely demand ischemia, No chest pain. EKG as above. Initial troponin negative Troponin peaked at 2.98,> 2.21>1.64 Continue aspirin and Plavix Cardiology consultation recommends conservative mx  No need for 2-D echo per cards    #9. Aortic stenosis/history of diastolic dysfunction. Echo with EF of 93% grade 2 diastolic dysfunction. ENP 338. Does not appear overloaded Cont Lasix for now, continue metoprolol   10. NSVT, - repeat EKG as needed, continue metoprolol XL  , keep potassium greater than 4, magnesium greater than 2   Discharge Exam:    Blood pressure 151/74, pulse 77, temperature 97.7 F (36.5 C), temperature source Oral, resp. rate 18, height 6' (1.829 m), weight 83.2 kg (183 lb 6.8 oz), SpO2 90 %.  General: No acute respiratory distress Lungs: Clear to auscultation bilaterally , some wheezes , no crackles Cardiovascular: Regular rate and rhythm without murmur gallop or rub normal S1 and S2 Abdomen: Nontender,  nondistended, soft, bowel sounds positive, no rebound, no ascites, no appreciable mass Extremities: No significant cyanosis, clubbing, or edema bilateral lower extremities    Follow-up Information    Follow up with ALTHEIMER,MICHAEL D, MD. Schedule an appointment as soon as possible for a visit in 3 days.   Specialty:  Endocrinology   Contact information:   [place] [address] [phone]       Signed: Reyne Dumas 01/16/2016, 2:40 PM        Time spent >45 mins

## 2016-01-16 NOTE — Progress Notes (Signed)
Rehab admissions - I met with patient and his wife this am.  I explained to wife that we have no beds on rehab at this time.  Patient known to me from previous inpatient rehab stay.  Wife looking at [facility] for possible rehab stay prior to home.  Call me for questions.  #[phone]

## 2016-01-16 NOTE — Progress Notes (Signed)
Report called to [facility] at [address] [facility].

## 2016-01-16 NOTE — Progress Notes (Signed)
Pharmacy Antibiotic Follow-up Note  Assessment:  Douglas Carrillo is a 82 y.o. year-old male admitted on 01/09/2016.  The patient is currently on day 8 of Vancomycin for MRSA bacteremia. A Vancomycin trough this morning resulted as 42mcg/ml- noted the dose was hung at 0555, level drawn at 0623.  Repeat VT was drawn appropriately and is therapeutic at 18 mcg/ml on vanc 1g q12h  Plan: - Continue Vancomycin at 1g IV q12h - Will continue to follow renal function, culture results, LOT, and antibiotic de-escalation plans   Temp (24hrs), Avg:98.1 F (36.7 C), Min:97.7 F (36.5 C), Max:98.5 F (36.9 C)   Recent Labs Lab 01/11/16 0604 01/12/16 0526 01/13/16 0503 01/15/16 1032 01/16/16 0623  WBC 9.3 9.0 9.2 11.1* 11.2*     Recent Labs Lab 01/10/16 0127 01/11/16 0604 01/12/16 0526 01/13/16 0503 01/15/16 1032  CREATININE 1.20 1.13 1.11 1.09 1.13   Estimated Creatinine Clearance: 52.5 mL/min (by C-G formula based on Cr of 1.13).    Allergies  Allergen Reactions  . Mold Extract [Trichophyton] Anaphylaxis  . Lipitor [Atorvastatin] Other (See Comments)    Weakness in legs/myalgias    Antimicrobials this admission: Vanc 1/17 >>  Cefepime 1/17 >> 1/19 Cefazolin 1/19 >> 1/20   Levels/dose changes this admission: 1/21 VT 14 mcg/ml >> increased to 1g/12h 1/24 VT 31 mcg/ml >> appears level drawn after dose given 1/24 VT 18 mcg/ml >> continue  Microbiology results: 1/17 BCx2>> MRSA (Vanc MIC < 0.5) 1/17 UC>> NGF  1/17 MRSA PCR: positive  1/20 BCx >> ngtd  Andrey Cota. Diona Foley, PharmD, [place] Clinical Pharmacist Pager [phone]  01/16/2016 7:04 PM

## 2016-01-17 ENCOUNTER — Encounter: Payer: Medicare Other | Admitting: Occupational Therapy

## 2016-01-17 ENCOUNTER — Ambulatory Visit: Payer: Medicare Other | Admitting: Physical Therapy

## 2016-01-17 ENCOUNTER — Other Ambulatory Visit: Payer: Self-pay

## 2016-01-17 LAB — CULTURE, BLOOD (ROUTINE X 2)
Culture: NO GROWTH
Culture: NO GROWTH

## 2016-01-17 MED ORDER — HYDROCODONE-HOMATROPINE 5-1.5 MG/5ML PO SYRP: 5.0000 mL | ORAL_SOLUTION | Freq: Four times a day (QID) | ORAL | Status: AC | PRN

## 2016-01-17 MED ORDER — HYDROCODONE-ACETAMINOPHEN 5-325 MG PO TABS: 1.0000 | ORAL_TABLET | ORAL | Status: DC | PRN

## 2016-01-17 MED ORDER — HYDROCODONE-HOMATROPINE 5-1.5 MG/5ML PO SYRP: 5.0000 mL | ORAL_SOLUTION | Freq: Four times a day (QID) | ORAL | Status: DC | PRN

## 2016-01-18 ENCOUNTER — Encounter: Payer: Self-pay | Admitting: *Deleted

## 2016-01-18 ENCOUNTER — Non-Acute Institutional Stay (SKILLED_NURSING_FACILITY): Payer: Medicare Other | Admitting: Internal Medicine

## 2016-01-18 ENCOUNTER — Encounter: Payer: Self-pay | Admitting: Internal Medicine

## 2016-01-18 DIAGNOSIS — R630 Anorexia: Secondary | ICD-10-CM | POA: Diagnosis not present

## 2016-01-18 DIAGNOSIS — I63412 Cerebral infarction due to embolism of left middle cerebral artery: Secondary | ICD-10-CM

## 2016-01-18 DIAGNOSIS — E118 Type 2 diabetes mellitus with unspecified complications: Secondary | ICD-10-CM

## 2016-01-18 DIAGNOSIS — D72829 Elevated white blood cell count, unspecified: Secondary | ICD-10-CM

## 2016-01-18 DIAGNOSIS — J189 Pneumonia, unspecified organism: Secondary | ICD-10-CM

## 2016-01-18 DIAGNOSIS — J449 Chronic obstructive pulmonary disease, unspecified: Secondary | ICD-10-CM

## 2016-01-18 DIAGNOSIS — D649 Anemia, unspecified: Secondary | ICD-10-CM | POA: Diagnosis not present

## 2016-01-18 DIAGNOSIS — K219 Gastro-esophageal reflux disease without esophagitis: Secondary | ICD-10-CM

## 2016-01-18 DIAGNOSIS — E876 Hypokalemia: Secondary | ICD-10-CM

## 2016-01-18 DIAGNOSIS — A4901 Methicillin susceptible Staphylococcus aureus infection, unspecified site: Secondary | ICD-10-CM

## 2016-01-18 DIAGNOSIS — I69391 Dysphagia following cerebral infarction: Secondary | ICD-10-CM

## 2016-01-18 DIAGNOSIS — R5381 Other malaise: Secondary | ICD-10-CM | POA: Diagnosis not present

## 2016-01-18 DIAGNOSIS — I10 Essential (primary) hypertension: Secondary | ICD-10-CM | POA: Diagnosis not present

## 2016-01-18 DIAGNOSIS — E46 Unspecified protein-calorie malnutrition: Secondary | ICD-10-CM | POA: Diagnosis not present

## 2016-01-18 NOTE — Progress Notes (Signed)
Patient ID: Douglas Carrillo, male   DOB: July 12, 1928, 82 y.o.   MRN: [id number]     Facility: [facility]    PCP: Limmie Patricia, MD   Allergies  Allergen Reactions  . Mold Extract [Trichophyton] Anaphylaxis  . Lipitor [Atorvastatin] Other (See Comments)    Weakness in legs/myalgias    Chief Complaint  Patient presents with  . New Admit To SNF    New Admission     HPI:  82 y.o. patient is here for short term rehabilitation post hospital admission from 01/09/16-01/16/16 with sepsis and acute respiratory failure in setting of staphylococcus aureus bacteremia. He had HCAP. He was started on antibiotic vancomycin. He was seen by SLP for dysphagia. His TEE was negative for vegetation. He had a peripheral iv placed for antibiotic as he declined PICC line. he is to be on vancomycin until 01/25/16.  He has past medical history of HTN, CAD, OSA, COPD- oxygen dependent, CKD, severe AS, CVA among others. He is seen in his room today. He complaints of poor appetite. He mentions being on o2 by nasal canula at night time at home. He has some dry cough and gets short of breath with minimal exertion.    Review of Systems:  Constitutional: Negative for fever, chills, diaphoresis.  HENT: Negative for headache, congestion, nasal discharge, difficulty swallowing.   Eyes: Negative for blurred vision, double vision and discharge.  Respiratory: Negative for wheezing.   Cardiovascular: Negative for chest pain, palpitations, leg swelling.  Gastrointestinal: Negative for heartburn, nausea, vomiting, abdominal pain. Had bowel movement yesterday Genitourinary: Negative for dysuria, flank pain.  Musculoskeletal: Negative for back pain, falls Skin: Negative for itching, rash.  Neurological: Negative for dizziness Psychiatric/Behavioral: Negative for depression   Past Medical History  Diagnosis Date  . Hypertension   . High cholesterol   . Prostate cancer ([place])   . CAD  (coronary artery disease)   . OSA (obstructive sleep apnea)   . COPD (chronic obstructive pulmonary disease) (HCC)     MODERATE  . Diabetes mellitus ([place])   . GERD (gastroesophageal reflux disease)   . Vertigo   . Arthritis     OA  . Anemia   . CHF (congestive heart failure) (HCC)     grade 2 diastolic dysfuction  . CKD (chronic kidney disease)   . 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 HCAP (healthcare-associated pneumonia)     12/2015   Past Surgical History  Procedure Laterality Date  . Coronary artery bypass graft  03/29/1999    x 4:  LIMA to the LAD,SVG to the diagonal branch of the LAD,SVG to the intermediate coronary artery & SVG to the posterior descending branch of the RCA.  . Colon surgery      x 4  . Nm myocar perf wall motion  04/28/2012    Low Risk Scan  . Prostate surgery    . Cardiac catheterization    . Cardiac catheterization N/A 12/12/2015    Procedure: Right/Left Heart Cath and Coronary/Graft Angiography;  Surgeon: Troy Sine, MD;  Location: [place] [facility];  Service: Cardiovascular;  Laterality: N/A;  . Peripheral vascular catheterization N/A 12/12/2015    Procedure: Aortic Arch Angiography;  Surgeon: Troy Sine, MD;  Location: [place] [facility];  Service: Cardiovascular;  Laterality: N/A;  . Eye surgery    . Tee without cardioversion N/A 01/12/2016    Procedure: TRANSESOPHAGEAL ECHOCARDIOGRAM (TEE);  Surgeon: Sanda Klein, MD;  [facility];  Service: Cardiovascular;  Laterality: N/A;   Social History:   reports that he quit smoking about 32 years ago. His smoking use included Cigarettes. He has a 30 pack-year smoking history. He has never used smokeless tobacco. He reports that he drinks alcohol. He reports that he does not use illicit drugs.  Family History  Problem Relation Age of Onset  . Kidney disease Brother   . Breast cancer Sister   . Rheum arthritis Mother     Medications:   Medication List       This list is accurate  as of: 01/18/16  8:48 PM.  Always use your most recent med list.               AEROCHAMBER PLUS FLO-VU Misc     aspirin 81 MG tablet  Take 81 mg by mouth daily.     b complex vitamins capsule  Take 1 capsule by mouth daily.     B-D ULTRAFINE III SHORT PEN 31G X 8 MM Misc  Generic drug:  Insulin Pen Needle  Reported on 01/18/2016     benzonatate 100 MG capsule  Commonly known as:  TESSALON  Take 100 mg by mouth 3 (three) times daily.     CENTRUM SILVER ULTRA MENS PO  Take 1 tablet by mouth daily.     clopidogrel 75 MG tablet  Commonly known as:  PLAVIX  TAKE 1 TABLET DAILY     CRESTOR 20 MG tablet  Generic drug:  rosuvastatin  Take 20 mg by mouth daily.     furosemide 40 MG tablet  Commonly known as:  LASIX  TAKE 1 TABLET DAILY     HYDROcodone-acetaminophen 5-325 MG tablet  Commonly known as:  NORCO/VICODIN  Take 1-2 tablets by mouth every 4 (four) hours as needed for moderate pain.     HYDROcodone-homatropine 5-1.5 MG/5ML syrup  Commonly known as:  HYCODAN  Take 5 mLs by mouth every 6 (six) hours as needed for cough.     insulin aspart 100 UNIT/ML injection  Commonly known as:  novoLOG  Correction coverage:Moderate (average weight, post-op) CBG < 70:implement hypoglycemia protocol CBG 70 - 120:0 units CBG 121 - 150:2 units CBG 151 - 200:3 units CBG 201 - 250:5 units CBG 251 - 300:8 units CBG 301 - 350:11 units CBG 351 - 400:15 units CBG > 400call MD and obtain STAT lab verification     insulin glargine 100 UNIT/ML injection  Commonly known as:  LANTUS  Inject 0.25 mLs (25 Units total) into the skin daily.     latanoprost 0.005 % ophthalmic solution  Commonly known as:  XALATAN  Place 1 drop into both eyes at bedtime.     metoprolol succinate 100 MG 24 hr tablet  Commonly known as:  TOPROL-XL  Take 1/2 tablet daily     omeprazole 20 MG capsule  Commonly known as:  PRILOSEC  Take 20 mg by mouth daily.     ONETOUCH DELICA LANCETS [id number] Misc    1 each by Other route 2 (two) times daily. Reported on 01/18/2016     [facility] VERIO test strip  Generic drug:  glucose blood  1 strip by Other route 2 (two) times daily. Reported on 01/18/2016     oxybutynin 5 MG 24 hr tablet  Commonly known as:  DITROPAN-XL  Take 5 mg by mouth daily.     pioglitazone 45 MG tablet  Commonly known as:  ACTOS  Take 45 mg by  mouth daily.     potassium chloride SA 20 MEQ tablet  Commonly known as:  KLOR-CON M20  Take 3 tablets daily.     PROAIR HFA 108 (90 Base) MCG/ACT inhaler  Generic drug:  albuterol  Inhale into the lungs as needed.     RESTASIS 0.05 % ophthalmic emulsion  Generic drug:  cycloSPORINE  Place 1 drop into both eyes 2 (two) times daily.     STIOLTO RESPIMAT 2.5-2.5 MCG/ACT Aers  Generic drug:  Tiotropium Bromide-Olodaterol  USE 2 INHALATIONS ORALLY   DAILY     THERATEARS ALLERGY 0.025 % ophthalmic solution  Generic drug:  ketotifen  Place 1 drop into both eyes 2 (two) times daily.     vancomycin 1 GM/200ML Soln  Commonly known as:  VANCOCIN  Inject 200 mLs (1,000 mg total) into the vein every 12 (twelve) hours.     vitamin C 500 MG tablet  Commonly known as:  ASCORBIC ACID  Take 500 mg by mouth 2 (two) times daily.     Vitamin D (Ergocalciferol) 50000 units Caps capsule  Commonly known as:  DRISDOL  Take 50,000 Units by mouth every 14 (fourteen) days. Every other week on Sunday     vitamin E 400 UNIT capsule  Take 400 Units by mouth daily.     ZETIA 10 MG tablet  Generic drug:  ezetimibe  Take 10 mg by mouth daily.         Physical Exam: Filed Vitals:   01/18/16 1509  BP: 145/79  Pulse: 82  Temp: 97.8 F (36.6 C)  TempSrc: Oral  Resp: 20  Height: 6' (1.829 m)  Weight: 190 lb (86.183 kg)  SpO2: 98%    General- elderly male, well built, in no acute distress Head- normocephalic, atraumatic Nose- no maxillary or frontal sinus tenderness, no nasal discharge Throat- moist mucus membrane  Eyes- PERRLA,  EOMI, no pallor, no icterus, no discharge, normal conjunctiva, normal sclera Neck- no cervical lymphadenopathy Cardiovascular- normal s1,s2, + murmur, palpable dorsalis pedis and radial pulses, no leg edema Respiratory- decreased air entry right > left, no wheeze, no rhonchi, no crackles, no use of accessory muscles, on o2 Abdomen- bowel sounds present, soft, non tender Musculoskeletal- able to move all 4 extremities, generalized weakness right lower extremity > left lower extremity Neurological- no focal deficit, alert and oriented to person, place and time Skin- warm and dry, LUE iv line + Psychiatry- normal mood and affect    Labs reviewed: Basic Metabolic Panel:  Recent Labs  01/12/16 0526 01/13/16 0503 01/13/16 1707 01/15/16 1032  NA 139 139  --  139  K 3.5 3.2*  --  4.2  CL 104 105  --  108  CO2 24 26  --  20*  GLUCOSE 204* 133*  --  174*  BUN 17 14  --  13  CREATININE 1.11 1.09  --  1.13  CALCIUM 9.1 8.7*  --  8.8*  MG  --   --  2.2 1.6*   Liver Function Tests:  Recent Labs  01/12/16 0526 01/13/16 0503 01/15/16 1032  AST 24 22 29   ALT 22 17 22   ALKPHOS 61 57 60  BILITOT 0.8 0.9 0.7  PROT 6.2* 5.8* 6.5  ALBUMIN 2.3* 2.0* 2.2*   No results for input(s): LIPASE, AMYLASE in the last 8760 hours. No results for input(s): AMMONIA in the last 8760 hours. CBC:  Recent Labs  12/13/15 0735  12/16/15 0647  01/09/16 0704  01/13/16 0503 01/15/16  1032 01/16/16 0623  WBC 6.5  < > 4.1  < > 7.2  < > 9.2 11.1* 11.2*  NEUTROABS 4.5  --  2.7  --  6.3  --   --   --   --   HGB 9.4*  < > 8.4*  < > 10.3*  < > 8.4* 9.1* 8.4*  HCT 29.6*  < > 27.4*  < > 31.9*  < > 26.0* 28.1* 26.3*  MCV 90.8  < > 90.7  < > 85.1  < > 85.8 84.4 86.2  PLT 196  < > 198  < > 271  < > 310 358 350  < > = values in this interval not displayed. Cardiac Enzymes:  Recent Labs  01/10/16 0127 01/10/16 0755 01/10/16 1412  TROPONINI 2.98* 2.21* 1.64*   BNP: Invalid input(s):  POCBNP CBG:  Recent Labs  01/16/16 1202 01/16/16 1732 01/16/16 2122  GLUCAP 195* 189* 162*    Radiological Exams: Dg Chest 2 View  01/09/2016  CLINICAL DATA:  Cough and shortness of breath for a week. EXAM: CHEST  2 VIEW COMPARISON:  January 04, 2016. FINDINGS: Stable cardiomegaly. Status post coronary artery bypass graft. No pneumothorax or significant pleural effusion is noted. There is interval development of right middle and lower lobe airspace opacities concerning for pneumonia. Left lung is clear. Bony thorax is unremarkable. IMPRESSION: Findings consistent with right middle and lower lobe pneumonia. Continued radiographic follow-up is recommended to ensure resolution. Electronically Signed   By: Marijo Conception, M.D.   On: 01/09/2016 08:08   Dg Swallowing Func-speech Pathology  01/10/2016  Objective Swallowing Evaluation:   Patient Details Name: Douglas Carrillo MRN: [id number] Date of Birth: August 08, 1928 Today's Date: 01/10/2016 Time: SLP Start Time (ACUTE ONLY): 0930-SLP Stop Time (ACUTE ONLY): 0945 SLP Time Calculation (min) (ACUTE ONLY): 15 min Past Medical History: Past Medical History Diagnosis Date . Hypertension  . High cholesterol  . Prostate cancer ([place])  . CAD (coronary artery disease)  . OSA (obstructive sleep apnea)  . COPD (chronic obstructive pulmonary disease) (HCC)    MODERATE . Diabetes mellitus ([place])  . GERD (gastroesophageal reflux disease)  . Vertigo  . Arthritis    OA . Anemia  . CHF (congestive heart failure) (HCC)    grade 2 diastolic dysfuction . CKD (chronic kidney disease)  . Stroke (cerebrum) ([place])    11/2015 . HCAP (healthcare-associated pneumonia)    12/2015 Past Surgical History: Past Surgical History Procedure Laterality Date . Coronary artery bypass graft  03/29/1999   x 4:  LIMA to the LAD,SVG to the diagonal branch of the LAD,SVG to the intermediate coronary artery & SVG to the posterior descending branch of the RCA. . Colon surgery     x 4 . Nm myocar perf wall motion   04/28/2012   Low Risk Scan . Prostate surgery   . Cardiac catheterization   . Cardiac catheterization N/A 12/12/2015   Procedure: Right/Left Heart Cath and Coronary/Graft Angiography;  Surgeon: Troy Sine, MD;  Location: [place] [facility];  Service: Cardiovascular;  Laterality: N/A; . Peripheral vascular catheterization N/A 12/12/2015   Procedure: Aortic Arch Angiography;  Surgeon: Troy Sine, MD;  Location: [place] [facility];  Service: Cardiovascular;  Laterality: N/A; . Eye surgery   HPI: 82 y.o. 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 He is admitted for sepsis secondary to PNA (RML and RLL per imaging report). Subjective:  pt alert, denies any difficulties with swallowing Assessment / Plan / Recommendation CHL IP CLINICAL IMPRESSIONS 01/10/2016 Therapy Diagnosis Mild oral phase dysphagia;Suspected primary esophageal dysphagia Clinical Impression Pt demonstrates no significant oropharyngeal dysphagia worrisome for aspiration risk. There is slow bolus formation with occasional premature spillage to valleculae. Initial sips of nectar showed a delay in swallow initiation, but as study progressed pt was taking consecutive sips of thin liquids with timely swallow. Strength WNL, no penetration or aspiration occurred despite attempt to tax system. Pt did have some delayed coughing; esophageal sweep showed appearence of significant stasis with purees which did not clear with sips of liquid. Pill transited with multiple bites and sips. Discussed esophageal precautions. Recommend regular diet and thin liquids. SLP will f/u for education and tolerance at bedside.  Impact on safety and function Moderate aspiration risk   CHL IP TREATMENT RECOMMENDATION 01/10/2016 Treatment Recommendations Therapy as outlined in treatment plan below   No flowsheet data found. CHL IP DIET RECOMMENDATION 01/10/2016 SLP Diet Recommendations Regular solids;Thin liquid  Liquid Administration via Cup;Straw Medication Administration Whole meds with puree Compensations Slow rate;Small sips/bites;Follow solids with liquid Postural Changes Remain semi-upright after after feeds/meals (Comment);Seated upright at 90 degrees   CHL IP OTHER RECOMMENDATIONS 01/10/2016 Recommended Consults -- Oral Care Recommendations Patient independent with oral care Other Recommendations --   CHL IP FOLLOW UP RECOMMENDATIONS 01/10/2016 Follow up Recommendations None   CHL IP FREQUENCY AND DURATION 01/10/2016 Speech Therapy Frequency (ACUTE ONLY) min 1 x/week Treatment Duration 1 week      CHL IP ORAL PHASE 01/10/2016 Oral Phase Impaired Oral - Pudding Teaspoon -- Oral - Pudding Cup -- Oral - Honey Teaspoon -- Oral - Honey Cup -- Oral - Nectar Teaspoon -- Oral - Nectar Cup Delayed oral transit;Premature spillage Oral - Nectar Straw Delayed oral transit;Premature spillage Oral - Thin Teaspoon -- Oral - Thin Cup Premature spillage Oral - Thin Straw Premature spillage Oral - Puree WFL Oral - Mech Soft -- Oral - Regular WFL Oral - Multi-Consistency -- Oral - Pill -- Oral Phase - Comment --  CHL IP PHARYNGEAL PHASE 01/10/2016 Pharyngeal Phase Impaired Pharyngeal- Pudding Teaspoon -- Pharyngeal -- Pharyngeal- Pudding Cup -- Pharyngeal -- Pharyngeal- Honey Teaspoon -- Pharyngeal -- Pharyngeal- Honey Cup -- Pharyngeal -- Pharyngeal- Nectar Teaspoon -- Pharyngeal -- Pharyngeal- Nectar Cup Delayed swallow initiation-vallecula Pharyngeal -- Pharyngeal- Nectar Straw Delayed swallow initiation-pyriform sinuses Pharyngeal -- Pharyngeal- Thin Teaspoon -- Pharyngeal -- Pharyngeal- Thin Cup WFL Pharyngeal -- Pharyngeal- Thin Straw WFL Pharyngeal -- Pharyngeal- Puree WFL Pharyngeal -- Pharyngeal- Mechanical Soft -- Pharyngeal -- Pharyngeal- Regular WFL Pharyngeal -- Pharyngeal- Multi-consistency -- Pharyngeal -- Pharyngeal- Pill WFL Pharyngeal -- Pharyngeal Comment --  CHL IP CERVICAL ESOPHAGEAL PHASE 01/10/2016 Cervical Esophageal  Phase Impaired Pudding Teaspoon -- Pudding Cup -- Honey Teaspoon -- Honey Cup -- Nectar Teaspoon -- Nectar Cup -- Nectar Straw -- Thin Teaspoon -- Thin Cup -- Thin Straw -- Puree -- Mechanical Soft -- Regular -- Multi-consistency -- Pill -- Cervical Esophageal Comment -- CHL IP GO 12/14/2015 Functional Assessment Tool Used skilled clinical judgement Functional Limitations Motor speech Swallow Current Status [phone]) (None) Swallow Goal Status [id number]) (None) Swallow Discharge Status [id number]) (None) Motor Speech Current Status [id number]) [place] Motor Speech Goal Status [id number]) [place] Motor Speech Goal Status [id number]) [place] Spoken Language Comprehension Current Status [id number]) (None) Spoken Language Comprehension Goal Status [id number]) (None) Spoken Language Comprehension Discharge Status [id number]) (None) Spoken Language Expression Current Status [id number]) (None) Spoken Language Expression Goal Status [id number]) (None) Spoken  Language Expression Discharge Status [phone]) (None) Attention Current Status [phone]) (None) Attention Goal Status [id number]) (None) Attention Discharge Status [phone]) (None) Memory Current Status [id number]) (None) Memory Goal Status [id number]) (None) Memory Discharge Status [id number]) (None) Voice Current Status [id number]) (None) Voice Goal Status [id number]) (None) Voice Discharge Status [id number]) (None) Other Speech-Language Pathology Functional Limitation [phone]) (None) Other Speech-Language Pathology Functional Limitation Goal Status [id number]) (None) Other Speech-Language Pathology Functional Limitation Discharge Status [phone]) (None) Herbie Baltimore, MA CCC-SLP [phone] [place], Katherene Ponto 01/10/2016, 10:15 AM                Assessment/Plan  Physical deconditioning With generalized weakness. Will have him work with physical therapy and occupational therapy team to help with gait training and muscle strengthening exercises.fall precautions. Skin care. Encourage to be out of bed.   Staph aureus bacteremia Continue and complete  course of vancomycin on 01/25/16. Continue iv line care. Dose vancomycin and f/u on trough. Check cbc with diff and cmp  HCAP Continue and complete course of vancomycin, needs aspiration precautions, encouraged to use incentive spirometer. Monitor wbc and temp curve. Wean off o2 as tolerated and get cxr in 4 weeks to assess for resolution  Hypomagnesemia Check bmp  Protein calorie malnutrition Get dietary consult and monitor po intake and weight. Continue MVI. Add protein supplement  Poor appetite Start remeron 7.5 mg daily to help stimulate appetite and monitor po intake.  Leukocytosis With bacteremia, afebrile at present, monitor cbc and temp curve  Anemia Monitor cbc  CVA Had recent CVA and was in inpatient rehab prior to this hospitalization. Continue aspirin, plavix and crestor  Dysphagia Post cva, continue dysphagia diet, aspiration precaution and SLP follow up  HTN Continue lasix 40 mg daily, toprol xl 100 mg daily. Stable bp at present, check bp daily  gerd Continue prilosec 20 mg daily  DM Lab Results  Component Value Date   HGBA1C 6.8* 12/14/2015  had been off actos and victoza in hospital. Discontinue actos here. Already off victoza. Continue lantus 25 u daily. Change novolog to 5 u for cbg 150-250 and 8 u for cbg >250 for now and check cbg  Hypokalemia With him on lasix, continue kcl and check bmp and mg level  COPD Wean off o2 as tolerated to keep him on o2 at night only, continue stiolto and proair    Goals of care: short term rehabilitation   Labs/tests ordered: cbc with diff, cmp next lab, chest xray in 4 weeks  Family/ staff Communication: reviewed care plan with patient and nursing supervisor    Blanchie Serve, MD  [place] [phone] (Monday-Friday 8 am - 5 pm) [phone] (afterhours)

## 2016-01-19 ENCOUNTER — Ambulatory Visit: Payer: Medicare Other | Admitting: Physical Therapy

## 2016-01-20 ENCOUNTER — Other Ambulatory Visit
Admission: RE | Admit: 2016-01-20 | Discharge: 2016-01-20 | Disposition: A | Discharge status: Home / Self Care | Payer: No Typology Code available for payment source | Source: Ambulatory Visit | Attending: Internal Medicine | Admitting: Internal Medicine

## 2016-01-20 DIAGNOSIS — J189 Pneumonia, unspecified organism: Secondary | ICD-10-CM | POA: Insufficient documentation

## 2016-01-20 LAB — CREATININE, SERUM
CREATININE: 1.15 mg/dL (ref 0.61–1.24)
GFR calc Af Amer: >60 mL/min (ref >60)
GFR, EST NON AFRICAN AMERICAN: 56 mL/min — AB (ref >60)

## 2016-01-20 LAB — BUN: BUN: 18 mg/dL (ref 6–20)

## 2016-01-20 LAB — VANCOMYCIN, TROUGH: Vancomycin Tr: 20 ug/mL (ref 10–20)

## 2016-01-21 ENCOUNTER — Encounter: Payer: Self-pay | Admitting: Physical Therapy

## 2016-01-22 ENCOUNTER — Ambulatory Visit: Payer: Medicare Other | Admitting: Physical Therapy

## 2016-01-22 ENCOUNTER — Ambulatory Visit: Payer: Medicare Other | Admitting: Occupational Therapy

## 2016-01-23 ENCOUNTER — Telehealth: Payer: Self-pay | Admitting: Cardiovascular Disease

## 2016-01-23 NOTE — Telephone Encounter (Signed)
Received records from [facility] for appointment with Dr Claiborne Billings on 03-13-2016.  Records given to [facility] (medical records) for Dr Evette Georges schedule on 2016-03-13. lp

## 2016-01-24 ENCOUNTER — Other Ambulatory Visit: Payer: Self-pay | Admitting: Cardiovascular Disease

## 2016-01-24 ENCOUNTER — Inpatient Hospital Stay: Payer: Medicare Other | Admitting: Infectious Diseases

## 2016-01-24 ENCOUNTER — Ambulatory Visit: Payer: Medicare Other | Admitting: Physical Therapy

## 2016-01-24 ENCOUNTER — Encounter: Payer: Medicare Other | Admitting: Occupational Therapy

## 2016-01-25 ENCOUNTER — Encounter: Payer: Self-pay | Admitting: *Deleted

## 2016-01-25 ENCOUNTER — Other Ambulatory Visit: Payer: Self-pay | Admitting: *Deleted

## 2016-01-25 NOTE — Patient Outreach (Signed)
Douglas Carrillo) Care [facility]illo  [facility] Social Work  01/25/2016  Douglas Carrillo 26-Mar-1928 [id number]  Subjective:    "I'm just here to get a little rehab".  Objective:   CSW agreed to follow patient at [facility]illo, [name] where patient currently resides to receive short-term rehabilitative services, to assist with discharge planning needs and services.  Current Medications:  Current Outpatient Prescriptions  Medication Sig Dispense Refill  . aspirin 81 MG tablet Take 81 mg by mouth daily.    Marland Kitchen b complex vitamins capsule Take 1 capsule by mouth daily.    . B-D ULTRAFINE III SHORT PEN 31G X 8 MM MISC Reported on 01/18/2016    . benzonatate (TESSALON) 100 MG capsule Take 100 mg by mouth 3 (three) times daily.  1  . clopidogrel (PLAVIX) 75 MG tablet TAKE 1 TABLET DAILY 90 tablet 0  . CRESTOR 20 MG tablet Take 20 mg by mouth daily.     . furosemide (LASIX) 40 MG tablet TAKE 1 TABLET DAILY 90 tablet 0  . HYDROcodone-acetaminophen (NORCO/VICODIN) 5-325 MG tablet Take 1-2 tablets by mouth every 4 (four) hours as needed for moderate pain. 30 tablet 0  . HYDROcodone-homatropine (HYCODAN) 5-1.5 MG/5ML syrup Take 5 mLs by mouth every 6 (six) hours as needed for cough. 120 mL 0  . insulin aspart (NOVOLOG) 100 UNIT/ML injection  Correction coverage: Moderate (average weight, post-op)    CBG < 70: implement hypoglycemia protocol    CBG 70 - 120: 0 units    CBG 121 - 150: 2 units    CBG 151 - 200: 3 units    CBG 201 - 250: 5 units    CBG 251 - 300: 8 units    CBG 301 - 350: 11 units    CBG 351 - 400: 15 units    CBG > 400 call MD and obtain STAT lab verification 10 mL 11  . insulin glargine (LANTUS) 100 UNIT/ML injection Inject 0.25 mLs (25 Units total) into the skin daily. 10 mL 11  . ketotifen (THERA TEARS ALLERGY) 0.025 % ophthalmic solution Place 1 drop into both eyes 2 (two) times daily.     Marland Kitchen latanoprost (XALATAN) 0.005 % ophthalmic  solution Place 1 drop into both eyes at bedtime.    Marland Kitchen losartan-hydrochlorothiazide (HYZAAR) 100-25 MG tablet TAKE 1 TABLET DAILY 90 tablet 0  . metoprolol succinate (TOPROL-XL) 100 MG 24 hr tablet Take 1/2 tablet daily  0  . Multiple Vitamins-Minerals (CENTRUM SILVER ULTRA MENS PO) Take 1 tablet by mouth daily.    Marland Kitchen omeprazole (PRILOSEC) 20 MG capsule Take 20 mg by mouth daily.    Douglas Carrillo DELICA LANCETS 77O MISC 1 each by Other route 2 (two) times daily. Reported on 01/18/2016    . ONETOUCH VERIO test strip 1 strip by Other route 2 (two) times daily. Reported on 01/18/2016    . oxybutynin (DITROPAN-XL) 5 MG 24 hr tablet Take 5 mg by mouth daily.     . pioglitazone (ACTOS) 45 MG tablet Take 45 mg by mouth daily.    . potassium chloride SA (KLOR-CON M20) 20 MEQ tablet Take 3 tablets daily. (Patient taking differently: Take 20 mEq by mouth 3 (three) times daily. Take 3 tablets daily.) 270 tablet 0  . PROAIR HFA 108 (90 BASE) MCG/ACT inhaler Inhale into the lungs as needed.    . RESTASIS 0.05 % ophthalmic emulsion Place 1 drop into both eyes 2 (two) times daily.    Marland Kitchen  Spacer/Aero-Holding Chambers (AEROCHAMBER PLUS FLO-VU) MISC   1  . STIOLTO RESPIMAT 2.5-2.5 MCG/ACT AERS USE 2 INHALATIONS ORALLY   DAILY 12 g 3  . vancomycin (VANCOCIN) 1 GM/200ML SOLN Inject 200 mLs (1,000 mg total) into the vein every 12 (twelve) hours. 4000 mL 0  . vitamin C (ASCORBIC ACID) 500 MG tablet Take 500 mg by mouth 2 (two) times daily.    . Vitamin D, Ergocalciferol, (DRISDOL) 50000 UNITS CAPS capsule Take 50,000 Units by mouth every 14 (fourteen) days. Every other week on Sunday    . vitamin E 400 UNIT capsule Take 400 Units by mouth daily.    Marland Kitchen ZETIA 10 MG tablet Take 10 mg by mouth daily.     No current facility-administered medications for this visit.    Functional Status:  In your present state of health, do you have any difficulty performing the following activities: 01/25/2016 01/09/2016  Hearing? N N  Vision? N  N  Difficulty concentrating or making decisions? Y N  Walking or climbing stairs? Y Y  Dressing or bathing? Y Y  Doing errands, shopping? [facility]illo  Preparing Food and eating ? Y -  Using the Toilet? Y -  In the past six months, have you accidently leaked urine? Y -  Do you have problems with loss of bowel control? N -  Managing your Medications? Y -  Managing your Finances? Y -  Housekeeping or managing your Housekeeping? Y -    Fall/Depression Screening:  PHQ 2/9 Scores 01/25/2016 01/27/2015  PHQ - 2 Score 1 0    Assessment:   CSW was able to make contact with patient today to perform the initial assessment, as well as assess and assist with social work needs and services.  CSW met with patient at [facility]illo, [name] where patient currently resides to receive short-term rehabilitative services.  CSW introduced self, explained role and types of services provided through [facility] ([place] [facility]).  CSW further explained to patient that CSW works with patient's RNCM, also with Douglas [facility]illo, [facility]. CSW then explained the reason for the call, indicating that Douglas Carrillo thought that patient would benefit from social work services and resources to assist with discharge planning needs and services.  CSW obtained two HIPAA compliant identifiers from patient, which included patient's name and date of birth. CSW spent a great deal of the visit trying to understand what patient was trying to say, as patient has difficulty with speech due to history of a Stroke.  Patient reported that his daughter, Douglas Carrillo moved into his home to provide 24 hour care and supervision, after patient suffered his Stroke and returned home from the Carr[facility].  Patient indicated that Douglas Carrillo will be his primary care provider upon release from [facility].  Patient gave CSW authorization to speak to Douglas Carrillo regarding his discharge planning needs.  CSW agreed to  contact Douglas Carrillo to work on discharge planning arrangements for patient, as CSW would like to ensure that patient has all the home care services and durable medical equipment he needs, prior to returning home.  Patient is agreeable to receiving social work services through [place] with Sherwood Carrillo.  Plan:   CSW will attend patient's Discharge Planning Meeting at [facility], [place] where patient currently resides to receive short-term rehabilitative services, scheduled for Monday, February 12, 2016 at 10:30am. CSW will converse with Douglas Carrillo, [facility] with [facility], to report findings of initial visit with patient today. CSW will prescribe and print EMMI information for patient to review at the next scheduled visit. CSW will fax a barriers letter and correspondence letter to patient's Primary Care Physician, Dr. [name] to ensure that Dr. Elyse Hsu is aware of CSW's involvement with patient's care.  Nat Christen, BSW, MSW, LCSW  Licensed Education officer, [facility]  Mailing [place] [address], [place], [place] [address] Physical Address-[address], [place], [place] [address] Toll Free Main # [phone] Fax # [phone] Cell # [phone]  Fax # [phone]  [email or web address]    [facility] complies with [employer] civil rights laws and does not discriminate on the basis of race, color, national origin, age, disability, or sex.  Espaol (Spanish)  [facility] cumple con las leyes federales de derechos civiles aplicables y no discrimina por motivos de raza, color, nacionalidad, edad, discapacidad o sexo.     Ti?[place] Vi?t ([place])  [place] tun th? lu?t dn quy?n hi?n hnh c?a Lin bang v khng phn bi?t ?i x? d?a trn ch?ng t?c, mu da, ngu?n g?c qu?c gia, ? tu?i, khuy?t t?t, ho?c gi?i  tnh.     (Arabic)    [place]

## 2016-01-25 NOTE — Telephone Encounter (Signed)
Rx request sent to pharmacy.  

## 2016-01-26 ENCOUNTER — Non-Acute Institutional Stay (SKILLED_NURSING_FACILITY): Payer: Medicare Other | Admitting: Family

## 2016-01-26 ENCOUNTER — Ambulatory Visit: Payer: Medicare Other | Admitting: Physical Therapy

## 2016-01-26 DIAGNOSIS — R05 Cough: Secondary | ICD-10-CM | POA: Diagnosis not present

## 2016-01-26 DIAGNOSIS — R195 Other fecal abnormalities: Secondary | ICD-10-CM | POA: Diagnosis not present

## 2016-01-26 DIAGNOSIS — R059 Cough, unspecified: Secondary | ICD-10-CM

## 2016-01-26 NOTE — Progress Notes (Signed)
Patient ID: Alisha Durr, male   DOB: 04/23/1928, 82 y.o.   MRN: [id number]  Location:  [facility] Provider:  Blanchie Serve, MD   Limmie Patricia, MD  Code Status:  Full Code  Goals of care: Advanced Directive information Advanced Directives 01/25/2016  Does patient have an advance directive? Yes  Type of Paramedic of [place];Living will  Does patient want to make changes to advanced directive? No - Patient declined  Copy of advanced directive(s) in chart? Yes  Would patient like information on creating an advanced directive? No - patient declined information     Chief Complaint  Patient presents with  . Acute Visit    HPI:  Pt is a 82 y.o. male seen today at [facility] for medical management of acute issues. He has past medical history of HTN, CAD, OSA, COPD- oxygen dependent, CKD, severe AS, CVA among others. He is seen in his room today. His recent lab results Hgb 7.0 with positive occult stool X 3. He complains of non-productive cough. He completed his I.V vancomycin 01/25/2016. He denies any fever, chills, fatigue, chest pain, shortness of breath or wheezing.He continues to work with PT/OT. Facility staff reports no new issues.   Review of Systems  Constitutional: Negative for fever, chills and diaphoresis.  HENT: Negative for congestion and sore throat.   Eyes: Negative.   Respiratory: Positive for cough. Negative for sputum production, shortness of breath and wheezing.   Cardiovascular: Negative for chest pain, palpitations and orthopnea.  Gastrointestinal: Negative for nausea, vomiting, abdominal pain, diarrhea and constipation.  Genitourinary: Negative.   Musculoskeletal: Negative for joint pain and falls.  Skin: Negative for itching and rash.  Neurological: Negative.  Negative for weakness and headaches.  Endo/Heme/Allergies: Does not bruise/bleed easily.  Psychiatric/Behavioral: Negative.      Past Medical History  Diagnosis Date  . Hypertension   . High cholesterol   . Prostate cancer ([place])   . CAD (coronary artery disease)   . OSA (obstructive sleep apnea)   . COPD (chronic obstructive pulmonary disease) (HCC)     MODERATE  . Diabetes mellitus ([place])   . GERD (gastroesophageal reflux disease)   . Vertigo   . Arthritis     OA  . Anemia   . CHF (congestive heart failure) (HCC)     grade 2 diastolic dysfuction  . CKD (chronic kidney disease)   . Stroke (cerebrum) ([place])     11/2015  . HCAP (healthcare-associated pneumonia)     12/2015   Past Surgical History  Procedure Laterality Date  . Coronary artery bypass graft  03/29/1999    x 4:  LIMA to the LAD,SVG to the diagonal branch of the LAD,SVG to the intermediate coronary artery & SVG to the posterior descending branch of the RCA.  . Colon surgery      x 4  . Nm myocar perf wall motion  04/28/2012    Low Risk Scan  . Prostate surgery    . Cardiac catheterization    . Cardiac catheterization N/A 12/12/2015    Procedure: Right/Left Heart Cath and Coronary/Graft Angiography;  Surgeon: Troy Sine, MD;  Location: [facility];  Service: Cardiovascular;  Laterality: N/A;  . Peripheral vascular catheterization N/A 12/12/2015    Procedure: Aortic Arch Angiography;  Surgeon: Troy Sine, MD;  Location: [facility];  Service: Cardiovascular;  Laterality: N/A;  . Eye surgery    . Tee without  cardioversion N/A 01/12/2016    Procedure: TRANSESOPHAGEAL ECHOCARDIOGRAM (TEE);  Surgeon: Sanda Klein, MD;  Location: [facility];  Service: Cardiovascular;  Laterality: N/A;    Allergies  Allergen Reactions  . Mold Extract [Trichophyton] Anaphylaxis  . Lipitor [Atorvastatin] Other (See Comments)    Weakness in legs/myalgias      Medication List       This list is accurate as of: 01/26/16  9:33 PM.  Always use your most recent med list.               AEROCHAMBER PLUS FLO-VU Misc     aspirin 81 MG  tablet  Take 81 mg by mouth daily.     b complex vitamins capsule  Take 1 capsule by mouth daily.     B-D ULTRAFINE III SHORT PEN 31G X 8 MM Misc  Generic drug:  Insulin Pen Needle  Reported on 01/18/2016     benzonatate 100 MG capsule  Commonly known as:  TESSALON  Take 100 mg by mouth 3 (three) times daily.     CENTRUM SILVER ULTRA MENS PO  Take 1 tablet by mouth daily.     clopidogrel 75 MG tablet  Commonly known as:  PLAVIX  TAKE 1 TABLET DAILY     CRESTOR 20 MG tablet  Generic drug:  rosuvastatin  Take 20 mg by mouth daily.     furosemide 40 MG tablet  Commonly known as:  LASIX  TAKE 1 TABLET DAILY     HYDROcodone-acetaminophen 5-325 MG tablet  Commonly known as:  NORCO/VICODIN  Take 1-2 tablets by mouth every 4 (four) hours as needed for moderate pain.     HYDROcodone-homatropine 5-1.5 MG/5ML syrup  Commonly known as:  HYCODAN  Take 5 mLs by mouth every 6 (six) hours as needed for cough.     insulin aspart 100 UNIT/ML injection  Commonly known as:  novoLOG  Correction coverage:Moderate (average weight, post-op) CBG < 70:implement hypoglycemia protocol CBG 70 - 120:0 units CBG 121 - 150:2 units CBG 151 - 200:3 units CBG 201 - 250:5 units CBG 251 - 300:8 units CBG 301 - 350:11 units CBG 351 - 400:15 units CBG > 400call MD and obtain STAT lab verification     insulin glargine 100 UNIT/ML injection  Commonly known as:  LANTUS  Inject 0.25 mLs (25 Units total) into the skin daily.     latanoprost 0.005 % ophthalmic solution  Commonly known as:  XALATAN  Place 1 drop into both eyes at bedtime.     losartan-hydrochlorothiazide 100-25 MG tablet  Commonly known as:  HYZAAR  TAKE 1 TABLET DAILY     metoprolol succinate 100 MG 24 hr tablet  Commonly known as:  TOPROL-XL  Take 1/2 tablet daily     omeprazole 20 MG capsule  Commonly known as:  PRILOSEC  Take 20 mg by mouth daily. Reported on [id number]     [facility] DELICA LANCETS [id number] Misc  1 each  by Other route 2 (two) times daily. Reported on 01/18/2016     [facility] VERIO test strip  Generic drug:  glucose blood  1 strip by Other route 2 (two) times daily. Reported on 01/18/2016     oxybutynin 5 MG 24 hr tablet  Commonly known as:  DITROPAN-XL  Take 5 mg by mouth daily.     pioglitazone 45 MG tablet  Commonly known as:  ACTOS  Take 45 mg by mouth daily.     potassium chloride SA 20 MEQ tablet  Commonly known as:  KLOR-CON M20  Take 3 tablets daily.     PROAIR HFA 108 (90 Base) MCG/ACT inhaler  Generic drug:  albuterol  Inhale into the lungs as needed.     RESTASIS 0.05 % ophthalmic emulsion  Generic drug:  cycloSPORINE  Place 1 drop into both eyes 2 (two) times daily.     STIOLTO RESPIMAT 2.5-2.5 MCG/ACT Aers  Generic drug:  Tiotropium Bromide-Olodaterol  USE 2 INHALATIONS ORALLY   DAILY     THERATEARS ALLERGY 0.025 % ophthalmic solution  Generic drug:  ketotifen  Place 1 drop into both eyes 2 (two) times daily.     vitamin C 500 MG tablet  Commonly known as:  ASCORBIC ACID  Take 500 mg by mouth 2 (two) times daily.     Vitamin D (Ergocalciferol) 50000 units Caps capsule  Commonly known as:  DRISDOL  Take 50,000 Units by mouth every 14 (fourteen) days. Every other week on Sunday     vitamin E 400 UNIT capsule  Take 400 Units by mouth daily.     ZETIA 10 MG tablet  Generic drug:  ezetimibe  Take 10 mg by mouth daily.        Immunization History  Administered Date(s) Administered  . Influenza Split 10/05/2014  . PPD Test 01/17/2016  . Pneumococcal Conjugate-13 12/19/2014   Pertinent  Health Maintenance Due  Topic Date Due  . FOOT EXAM  07/09/1938  . OPHTHALMOLOGY EXAM  07/09/1940  . INFLUENZA VACCINE  07/24/2015  . PNA vac Low Risk Adult (2 of 2 - PPSV23) 12/20/2015  . HEMOGLOBIN A1C  06/13/2016   Fall Risk  01/25/2016 01/27/2015  Falls in the past year? Yes Yes  Number falls in past yr: 2 or more 2 or more  Injury with Fall? Yes Yes  Risk Factor  Category  High Fall Risk High Fall Risk  Risk for fall due to : History of fall(s);Impaired balance/gait;Impaired mobility;Impaired vision;Mental status change History of fall(s)  Follow up Education provided;Falls prevention discussed -    Filed Vitals:   01/26/16 2129  BP: 132/73  Pulse: 80  Temp: 98.8 F (37.1 C)  Resp: 20  Weight: 182 lb 6.4 oz (82.736 kg)  SpO2: 93%   Body mass index is 24.73 kg/(m^2). Physical Exam  Constitutional: He appears well-developed and well-nourished. No distress.  HENT:  Head: Normocephalic.  Right Ear: External ear normal.  Left Ear: External ear normal.  Mouth/Throat: Oropharynx is clear and moist.  Eyes: Conjunctivae and EOM are normal. Pupils are equal, round, and reactive to light. Right eye exhibits no discharge. Left eye exhibits no discharge. No scleral icterus.  Neck: Normal range of motion. No JVD present.  Cardiovascular: Normal rate, regular rhythm, normal heart sounds and intact distal pulses.  Exam reveals no gallop and no friction rub.   No murmur heard. Pulmonary/Chest: Effort normal. No respiratory distress. He has no wheezes. He has no rales.  Bilateral diminished breath sounds to lung bases.   Abdominal: Soft. Bowel sounds are normal. He exhibits no distension and no mass. There is no tenderness. There is no rebound and no guarding.  Genitourinary: Guaiac positive stool.  Musculoskeletal: He exhibits no edema or tenderness.  Lymphadenopathy:    He has no cervical adenopathy.  Neurological: He is alert.  Skin: Skin is warm and dry. No rash noted. No erythema. No pallor.  Psychiatric: He has a normal mood and affect.    Labs reviewed:  Recent Labs  01/12/16  [id number] 01/13/16 0503 01/13/16 1707 01/15/16 1032 01/19/16 2130  NA 139 139  --  139  --   K 3.5 3.2*  --  4.2  --   CL 104 105  --  108  --   CO2 24 26  --  20*  --   GLUCOSE 204* 133*  --  174*  --   BUN 17 14  --  13 18  CREATININE 1.11 1.09  --  1.13 1.15    CALCIUM 9.1 8.7*  --  8.8*  --   MG  --   --  2.2 1.6*  --     Recent Labs  01/12/16 0526 01/13/16 0503 01/15/16 1032  AST 24 22 29   ALT 22 17 22   ALKPHOS 61 57 60  BILITOT 0.8 0.9 0.7  PROT 6.2* 5.8* 6.5  ALBUMIN 2.3* 2.0* 2.2*    Recent Labs  12/13/15 0735  12/16/15 0647  01/09/16 0704  01/13/16 0503 01/15/16 1032 01/16/16 0623  WBC 6.5  < > 4.1  < > 7.2  < > 9.2 11.1* 11.2*  NEUTROABS 4.5  --  2.7  --  6.3  --   --   --   --   HGB 9.4*  < > 8.4*  < > 10.3*  < > 8.4* 9.1* 8.4*  HCT 29.6*  < > 27.4*  < > 31.9*  < > 26.0* 28.1* 26.3*  MCV 90.8  < > 90.7  < > 85.1  < > 85.8 84.4 86.2  PLT 196  < > 198  < > 271  < > 310 358 350  < > = values in this interval not displayed. Lab Results  Component Value Date   TSH 2.620 11/13/2015   Lab Results  Component Value Date   HGBA1C 6.8* 12/14/2015   Lab Results  Component Value Date   CHOL 105 12/14/2015   HDL 37* 12/14/2015   LDLCALC 53 12/14/2015   TRIG 76 12/14/2015   CHOLHDL 2.8 12/14/2015    Significant Diagnostic Results in last 30 days:  Dg Chest 2 View  01/09/2016  CLINICAL DATA:  Cough and shortness of breath for a week. EXAM: CHEST  2 VIEW COMPARISON:  January 04, 2016. FINDINGS: Stable cardiomegaly. Status post coronary artery bypass graft. No pneumothorax or significant pleural effusion is noted. There is interval development of right middle and lower lobe airspace opacities concerning for pneumonia. Left lung is clear. Bony thorax is unremarkable. IMPRESSION: Findings consistent with right middle and lower lobe pneumonia. Continued radiographic follow-up is recommended to ensure resolution. Electronically Signed   By: Marijo Conception, M.D.   On: 01/09/2016 08:08   Dg Chest 2 View  01/04/2016  CLINICAL DATA:  Wheezing, cough, congestion for 1 week EXAM: CHEST  2 VIEW COMPARISON:  12/13/2015 FINDINGS: Cardiomediastinal silhouette is stable. Central mild vascular congestion without convincing pulmonary edema.  Status post CABG. Mild infrahilar bronchitic changes. No segmental infiltrate. Mild hyperinflation. IMPRESSION: Central mild vascular congestion without convincing pulmonary edema. Mild infrahilar bronchitic changes. Status post CABG. Mild hyperinflation. Electronically Signed   By: Lahoma Crocker M.D.   On: 01/04/2016 15:52   Dg Swallowing Func-speech Pathology  01/10/2016  Objective Swallowing Evaluation:   Patient Details Name: Serafim Kiesewetter MRN: [id number] Date of Birth: [birth year] Today's Date: 01/10/2016 Time: SLP Start Time (ACUTE ONLY): 0930-SLP Stop Time (ACUTE ONLY): 0945 SLP Time Calculation (min) (ACUTE ONLY): 15 min Past Medical History: Past Medical History Diagnosis Date . Hypertension  .  High cholesterol  . Prostate cancer ([place])  . CAD (coronary artery disease)  . OSA (obstructive sleep apnea)  . COPD (chronic obstructive pulmonary disease) (HCC)    MODERATE . Diabetes mellitus ([place])  . GERD (gastroesophageal reflux disease)  . Vertigo  . Arthritis    OA . Anemia  . CHF (congestive heart failure) (HCC)    grade 2 diastolic dysfuction . CKD (chronic kidney disease)  . Stroke (cerebrum) ([place])    11/2015 . HCAP (healthcare-associated pneumonia)    12/2015 Past Surgical History: Past Surgical History Procedure Laterality Date . Coronary artery bypass graft  03/29/1999   x 4:  LIMA to the LAD,SVG to the diagonal branch of the LAD,SVG to the intermediate coronary artery & SVG to the posterior descending branch of the RCA. . Colon surgery     x 4 . Nm myocar perf wall motion  04/28/2012   Low Risk Scan . Prostate surgery   . Cardiac catheterization   . Cardiac catheterization N/A 12/12/2015   Procedure: Right/Left Heart Cath and Coronary/Graft Angiography;  Surgeon: Troy Sine, MD;  Location: [place] [facility];  Service: Cardiovascular;  Laterality: N/A; . Peripheral vascular catheterization N/A 12/12/2015   Procedure: Aortic Arch Angiography;  Surgeon: Troy Sine, MD;  Location: [place] [facility];   Service: Cardiovascular;  Laterality: N/A; . Eye surgery   HPI: 82 y.o. 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 He is admitted for sepsis secondary to PNA (RML and RLL per imaging report). Subjective: pt alert, denies any difficulties with swallowing Assessment / Plan / Recommendation CHL IP CLINICAL IMPRESSIONS 01/10/2016 Therapy Diagnosis Mild oral phase dysphagia;Suspected primary esophageal dysphagia Clinical Impression Pt demonstrates no significant oropharyngeal dysphagia worrisome for aspiration risk. There is slow bolus formation with occasional premature spillage to valleculae. Initial sips of nectar showed a delay in swallow initiation, but as study progressed pt was taking consecutive sips of thin liquids with timely swallow. Strength WNL, no penetration or aspiration occurred despite attempt to tax system. Pt did have some delayed coughing; esophageal sweep showed appearence of significant stasis with purees which did not clear with sips of liquid. Pill transited with multiple bites and sips. Discussed esophageal precautions. Recommend regular diet and thin liquids. SLP will f/u for education and tolerance at bedside.  Impact on safety and function Moderate aspiration risk   CHL IP TREATMENT RECOMMENDATION 01/10/2016 Treatment Recommendations Therapy as outlined in treatment plan below   No flowsheet data found. CHL IP DIET RECOMMENDATION 01/10/2016 SLP Diet Recommendations Regular solids;Thin liquid Liquid Administration via Cup;Straw Medication Administration Whole meds with puree Compensations Slow rate;Small sips/bites;Follow solids with liquid Postural Changes Remain semi-upright after after feeds/meals (Comment);Seated upright at 90 degrees   CHL IP OTHER RECOMMENDATIONS 01/10/2016 Recommended Consults -- Oral Care Recommendations Patient independent with oral care Other Recommendations --   CHL IP FOLLOW UP RECOMMENDATIONS  01/10/2016 Follow up Recommendations None   CHL IP FREQUENCY AND DURATION 01/10/2016 Speech Therapy Frequency (ACUTE ONLY) min 1 x/week Treatment Duration 1 week      CHL IP ORAL PHASE 01/10/2016 Oral Phase Impaired Oral - Pudding Teaspoon -- Oral - Pudding Cup -- Oral - Honey Teaspoon -- Oral - Honey Cup -- Oral - Nectar Teaspoon -- Oral - Nectar Cup Delayed oral transit;Premature spillage Oral - Nectar Straw Delayed oral transit;Premature spillage Oral - Thin Teaspoon -- Oral - Thin Cup Premature spillage Oral - Thin  Straw Premature spillage Oral - Puree WFL Oral - Mech Soft -- Oral - Regular WFL Oral - Multi-Consistency -- Oral - Pill -- Oral Phase - Comment --  CHL IP PHARYNGEAL PHASE 01/10/2016 Pharyngeal Phase Impaired Pharyngeal- Pudding Teaspoon -- Pharyngeal -- Pharyngeal- Pudding Cup -- Pharyngeal -- Pharyngeal- Honey Teaspoon -- Pharyngeal -- Pharyngeal- Honey Cup -- Pharyngeal -- Pharyngeal- Nectar Teaspoon -- Pharyngeal -- Pharyngeal- Nectar Cup Delayed swallow initiation-vallecula Pharyngeal -- Pharyngeal- Nectar Straw Delayed swallow initiation-pyriform sinuses Pharyngeal -- Pharyngeal- Thin Teaspoon -- Pharyngeal -- Pharyngeal- Thin Cup WFL Pharyngeal -- Pharyngeal- Thin Straw WFL Pharyngeal -- Pharyngeal- Puree WFL Pharyngeal -- Pharyngeal- Mechanical Soft -- Pharyngeal -- Pharyngeal- Regular WFL Pharyngeal -- Pharyngeal- Multi-consistency -- Pharyngeal -- Pharyngeal- Pill WFL Pharyngeal -- Pharyngeal Comment --  CHL IP CERVICAL ESOPHAGEAL PHASE 01/10/2016 Cervical Esophageal Phase Impaired Pudding Teaspoon -- Pudding Cup -- Honey Teaspoon -- Honey Cup -- Nectar Teaspoon -- Nectar Cup -- Nectar Straw -- Thin Teaspoon -- Thin Cup -- Thin Straw -- Puree -- Mechanical Soft -- Regular -- Multi-consistency -- Pill -- Cervical Esophageal Comment -- CHL IP GO 12/14/2015 Functional Assessment Tool Used skilled clinical judgement Functional Limitations Motor speech Swallow Current Status [phone]) (None) Swallow  Goal Status [id number]) (None) Swallow Discharge Status [id number]) (None) Motor Speech Current Status [id number]) [place] Motor Speech Goal Status UK:060616) [place] Motor Speech Goal Status SA:931536) [place] Spoken Language Comprehension Current Status [id number]) (None) Spoken Language Comprehension Goal Status [id number]) (None) Spoken Language Comprehension Discharge Status [id number]) (None) Spoken Language Expression Current Status FP:[id number]) (None) Spoken Language Expression Goal Status [id number]) (None) Spoken Language Expression Discharge Status NF:1565649) (None) Attention Current Status [id number]) (None) Attention Goal Status EY:7266000) (None) Attention Discharge Status [id number]) (None) Memory Current Status [id number]) (None) Memory Goal Status [id number]) (None) Memory Discharge Status QC:115444) (None) Voice Current Status [id number]) (None) Voice Goal Status [id number]) (None) Voice Discharge Status [id number]) (None) Other Speech-Language Pathology Functional Limitation UC:978821) (None) Other Speech-Language Pathology Functional Limitation Goal Status [id number]) (None) Other Speech-Language Pathology Functional Limitation Discharge Status [phone]) (None) Herbie Baltimore, MA CCC-SLP [phone] [place], Katherene Ponto 01/10/2016, 10:15 AM               Assessment/Plan Occult blood positive stool Stool positive X 3 Plavix, ASA on hold. Discontinue Omeprazole then start Protonix 40 mg Tablet daily. Continue to monitor CBC  Cough: Non-productive cough. Continue with Tussin PRN. Portable CXR PA/Lat      Family/ staff Communication: Reviewed Plan of Care with Facility Nurse.   Labs/tests ordered:  Portable CXR 2 views

## 2016-01-29 ENCOUNTER — Encounter: Payer: Medicare Other | Admitting: Occupational Therapy

## 2016-01-29 ENCOUNTER — Ambulatory Visit: Payer: Medicare Other | Admitting: Physical Therapy

## 2016-01-30 ENCOUNTER — Encounter: Payer: Self-pay | Admitting: Physical Therapy

## 2016-01-30 ENCOUNTER — Ambulatory Visit: Payer: Medicare Other | Admitting: Physical Therapy

## 2016-01-30 NOTE — Therapy (Signed)
[facility] [address] [place], [place], [address] Phone: [phone]   Fax:  [phone]  Patient Details  Name: Douglas Carrillo MRN: [id number] Date of Birth: 1928-09-07 Referring Provider:  Alysia Penna, MD  Encounter Date: 01/30/2016  PHYSICAL THERAPY DISCHARGE SUMMARY  Visits from Start of Care: 1  Current functional level related to goals / functional outcomes: No changes as not seen after evaluation.   Remaining deficits: Patient was hospitalized and then went to subacute setting.    Education / Equipment: None Plan: Patient agrees to discharge.  Patient goals were not met. Patient is being discharged due to a change in medical status.  ?????        Ikeisha Blumberg PT, DPT 01/30/2016, 10:25 AM  [facility] [address] [place], [place], [address] Phone: [phone]   Fax:  [phone]

## 2016-02-01 ENCOUNTER — Encounter: Payer: Medicare Other | Admitting: Occupational Therapy

## 2016-02-01 ENCOUNTER — Ambulatory Visit: Payer: Medicare Other | Admitting: Physical Therapy

## 2016-02-02 ENCOUNTER — Non-Acute Institutional Stay (SKILLED_NURSING_FACILITY): Payer: Medicare Other | Admitting: Family

## 2016-02-02 DIAGNOSIS — J189 Pneumonia, unspecified organism: Secondary | ICD-10-CM

## 2016-02-02 NOTE — Progress Notes (Signed)
Patient ID: Douglas Carrillo, male   DOB: [birth year], 82 y.o.   MRN: [id number]  Location:  [facility]  Provider:  Blanchie Serve, MD   Limmie Patricia, MD  Code Status:  Full Code  Goals of care: Advanced Directive information Advanced Directives 02/08/2016  Does patient have an advance directive? Yes  Type of Advance Directive [place]  Does patient want to make changes to advanced directive? No - Patient declined  Copy of advanced directive(s) in chart? Yes     Chief Complaint  Patient presents with  . Acute Visit    HPI:  Pt is a 82 y.o. male seen today at [facility] for Pneumonia. He is here for short term rehabilitation post hospital admission from 01/09/16-01/16/16 with sepsis and acute respiratory failure in setting of staphylococcus aureus bacteremia. He had HCAP. He was started on antibiotic vancomycin. He was seen by SLP for dysphagia. His TEE was negative for vegetation. He had a peripheral iv placed for antibiotic as he declined PICC line. He has past medical history of HTN, CAD, OSA, COPD- oxygen dependent, CKD, severe AS, CVA among others. He is seen in his room today with wife present at bedside.His chest X-ray done 02/01/2016 showed patchy bibasilar densities right greater than the left compatible with pneumonia. He denies any chills, fever, chest pain, shortness of breath, wheezing.He states his appetite has improved. He is current on Avelox 400 mg Tablet day # 7. Agreed to complete current antibiotics dose then will follow up x-ray.   Review of Systems  Constitutional: Negative for fever, chills and malaise/fatigue.  HENT: Negative.   Eyes: Negative.   Respiratory: Negative for sputum production, shortness of breath and wheezing.        Non-productive cough  Cardiovascular: Negative for chest pain, palpitations, orthopnea and leg swelling.  Gastrointestinal: Negative.   Genitourinary: Negative.     Musculoskeletal: Negative for myalgias, joint pain and falls.  Skin: Negative.   Neurological: Negative.   Endo/Heme/Allergies: Negative.   Psychiatric/Behavioral: Negative.     Past Medical History  Diagnosis Date  . Hypertension   . High cholesterol   . Prostate cancer ([place])   . OSA (obstructive sleep apnea)     Not currently on CPAP/uses 2 L/m while sleeping  . COPD (chronic obstructive pulmonary disease) (HCC)     MODERATE  . Diabetes mellitus ([place])   . GERD (gastroesophageal reflux disease)   . Vertigo   . Arthritis     OA  . Anemia   . Chronic diastolic (congestive) heart failure ([place])   . CKD (chronic kidney disease), stage II   . Stroke (cerebrum) ([place])     a. 11/2015 - following cardiac cath.  Marland Kitchen HCAP (healthcare-associated pneumonia)     12/2015  . CAD (coronary artery disease)     a.  s/p CABG 2000. b. [facility] 11/2015: showing patent grafts but +mod-severe pulm HTN with moderately severe AS and EF 30-35%. TAVR consult planned but patient developed post-cath stroke.  . Severe aortic stenosis   . Staphylococcus aureus bacteremia     a. Dx 12/2015.  . Moderate to severe pulmonary hypertension ([place])     a. [facility] 11/2105.  Marland Kitchen NSVT (nonsustained ventricular tachycardia) ([place])   . Chronic respiratory failure ([place])   . DVT (deep venous thrombosis) ([place])     a. Dx 01/2016 + DVT noted in the right peroneal veins, left distal popliteal vein, left soleal veins, and  left peroneal veins.   Past Surgical History  Procedure Laterality Date  . Coronary artery bypass graft  03/29/1999    x 4:  LIMA to the LAD,SVG to the diagonal branch of the LAD,SVG to the intermediate coronary artery & SVG to the posterior descending branch of the RCA.  . Colon surgery      x 4  . Nm myocar perf wall motion  04/28/2012    Low Risk Scan  . Prostate surgery    . Cardiac catheterization    . Cardiac catheterization N/A 12/12/2015    Procedure: Right/Left Heart Cath and Coronary/Graft Angiography;   Surgeon: Troy Sine, MD;  Location: [place] [facility];  Service: Cardiovascular;  Laterality: N/A;  . Peripheral vascular catheterization N/A 12/12/2015    Procedure: Aortic Arch Angiography;  Surgeon: Troy Sine, MD;  Location: [place] [facility];  Service: Cardiovascular;  Laterality: N/A;  . Eye surgery    . Tee without cardioversion N/A 01/12/2016    Procedure: TRANSESOPHAGEAL ECHOCARDIOGRAM (TEE);  Surgeon: Sanda Klein, MD;  Location: [facility] ENDOSCOPY;  Service: Cardiovascular;  Laterality: N/A;    Allergies  Allergen Reactions  . Mold Extract [Trichophyton] Anaphylaxis  . Lipitor [Atorvastatin] Other (See Comments)    Weakness in legs/myalgias      Medication List       This list is accurate as of: 02/02/16 11:59 PM.  Always use your most recent med list.               AEROCHAMBER PLUS FLO-VU Misc     aspirin 81 MG tablet  Take 81 mg by mouth daily.     b complex vitamins capsule  Take 1 capsule by mouth daily.     B-D ULTRAFINE III SHORT PEN 31G X 8 MM Misc  Generic drug:  Insulin Pen Needle  Reported on 01/18/2016     benzonatate 100 MG capsule  Commonly known as:  TESSALON  Take 100 mg by mouth 3 (three) times daily.     CENTRUM SILVER ULTRA MENS PO  Take 1 tablet by mouth daily.     clopidogrel 75 MG tablet  Commonly known as:  PLAVIX  TAKE 1 TABLET DAILY     CRESTOR 20 MG tablet  Generic drug:  rosuvastatin  Take 20 mg by mouth daily.     furosemide 40 MG tablet  Commonly known as:  LASIX  TAKE 1 TABLET DAILY     HYDROcodone-acetaminophen 5-325 MG tablet  Commonly known as:  NORCO/VICODIN  Take 1-2 tablets by mouth every 4 (four) hours as needed for moderate pain.     HYDROcodone-homatropine 5-1.5 MG/5ML syrup  Commonly known as:  HYCODAN  Take 5 mLs by mouth every 6 (six) hours as needed for cough.     insulin aspart 100 UNIT/ML injection  Commonly known as:  novoLOG  Correction coverage:Moderate (average weight, post-op) CBG <  70:implement hypoglycemia protocol CBG 70 - 120:0 units CBG 121 - 150:2 units CBG 151 - 200:3 units CBG 201 - 250:5 units CBG 251 - 300:8 units CBG 301 - 350:11 units CBG 351 - 400:15 units CBG > 400call MD and obtain STAT lab verification     insulin glargine 100 UNIT/ML injection  Commonly known as:  LANTUS  Inject 0.25 mLs (25 Units total) into the skin daily.     latanoprost 0.005 % ophthalmic solution  Commonly known as:  XALATAN  Place 1 drop into both eyes at bedtime.  losartan-hydrochlorothiazide 100-25 MG tablet  Commonly known as:  HYZAAR  TAKE 1 TABLET DAILY     metoprolol succinate 100 MG 24 hr tablet  Commonly known as:  TOPROL-XL  Take 1/2 tablet daily     omeprazole 20 MG capsule  Commonly known as:  PRILOSEC  Take 20 mg by mouth daily. Reported on [id number]     [facility] DELICA LANCETS [id number] Misc  1 each by Other route 2 (two) times daily. Reported on 01/18/2016     [facility] VERIO test strip  Generic drug:  glucose blood  1 strip by Other route 2 (two) times daily. Reported on 01/18/2016     oxybutynin 5 MG 24 hr tablet  Commonly known as:  DITROPAN-XL  Take 5 mg by mouth daily.     pioglitazone 45 MG tablet  Commonly known as:  ACTOS  Take 45 mg by mouth daily.     potassium chloride SA 20 MEQ tablet  Commonly known as:  KLOR-CON M20  Take 3 tablets daily.     PROAIR HFA 108 (90 Base) MCG/ACT inhaler  Generic drug:  albuterol  Inhale 2 puffs into the lungs every 6 (six) hours as needed for wheezing or shortness of breath.     RESTASIS 0.05 % ophthalmic emulsion  Generic drug:  cycloSPORINE  Place 1 drop into both eyes 2 (two) times daily.     STIOLTO RESPIMAT 2.5-2.5 MCG/ACT Aers  Generic drug:  Tiotropium Bromide-Olodaterol  USE 2 INHALATIONS ORALLY   DAILY     THERATEARS ALLERGY 0.025 % ophthalmic solution  Generic drug:  ketotifen  Place 1 drop into both eyes 2 (two) times daily.     vitamin C 500 MG tablet  Commonly known as:   ASCORBIC ACID  Take 500 mg by mouth 2 (two) times daily.     Vitamin D (Ergocalciferol) 50000 units Caps capsule  Commonly known as:  DRISDOL  Take 50,000 Units by mouth every 14 (fourteen) days. Every other week on Sunday     vitamin E 400 UNIT capsule  Take 400 Units by mouth daily.     ZETIA 10 MG tablet  Generic drug:  ezetimibe  Take 10 mg by mouth daily.        Immunization History  Administered Date(s) Administered  . Influenza Split 10/05/2014  . PPD Test 01/17/2016  . Pneumococcal Conjugate-13 12/19/2014   There are no preventive care reminders to display for this patient. Fall Risk  01/25/2016 01/27/2015  Falls in the past year? Yes Yes  Number falls in past yr: 2 or more 2 or more  Injury with Fall? Yes Yes  Risk Factor Category  High Fall Risk High Fall Risk  Risk for fall due to : History of fall(s);Impaired balance/gait;Impaired mobility;Impaired vision;Mental status change History of fall(s)  Follow up Education provided;Falls prevention discussed -    Filed Vitals:   02/02/16 1646  BP: 112/72  Pulse: 72  Temp: 98.5 F (36.9 C)  Resp: 18  Weight: 182 lb 6.4 oz (82.736 kg)  SpO2: 98%   Body mass index is 24.73 kg/(m^2). Physical Exam  Constitutional: He is oriented to person, place, and time. He appears well-developed and well-nourished.  Elderly in no acute distress.  HENT:  Head: Normocephalic.  Mouth/Throat: Oropharynx is clear and moist.  Eyes: Conjunctivae and EOM are normal. Pupils are equal, round, and reactive to light. Right eye exhibits no discharge. Left eye exhibits no discharge. No scleral icterus.  Neck: Normal range of  motion. No JVD present. No tracheal deviation present.  Cardiovascular: Normal rate, regular rhythm, normal heart sounds and intact distal pulses.  Exam reveals no gallop and no friction rub.   No murmur heard. Pulmonary/Chest: Effort normal. No respiratory distress. He has no wheezes. He has no rales. He exhibits no  tenderness.  Diminished breath sounds Bilateral bases.  Abdominal: Soft. Bowel sounds are normal. He exhibits no distension and no mass. There is no tenderness. There is no rebound and no guarding.  Lymphadenopathy:    He has no cervical adenopathy.  Neurological: He is oriented to person, place, and time.  Skin: Skin is warm and dry. No rash noted. No erythema. No pallor.  Psychiatric: He has a normal mood and affect.    Labs reviewed:  Recent Labs  02/08/16 0311  02/12/16 0311 02/02/2016 0312 02/14/16 0907  NA 136  < > 136 136 141  K 3.7  < > 3.6 3.9 3.3*  CL 104  < > 101 101 106  CO2 23  < > 24 24 25   GLUCOSE 152*  < > 345* 298* 124*  BUN 36*  < > 54* 49* 34*  CREATININE 1.22  < > 1.32* 1.24 1.12  CALCIUM 8.1*  < > 9.0 8.8* 9.0  MG 1.9  --  1.9  --  2.0  PHOS 2.4*  --  3.7  --  3.1  < > = values in this interval not displayed.  Recent Labs  02/10/16 0702 02/11/16 0329 02/12/16 0311  AST 65* 39 32  ALT 155* 113* 90*  ALKPHOS 328* 299* 263*  BILITOT 0.8 1.0 0.7  PROT 7.2 7.1 7.0  ALBUMIN 2.4* 2.4* 2.3*    Recent Labs  01/09/16 0704  02/03/16 1310  02/05/16 0150  02/12/16 0311 01/28/2016 0312 02/14/16 0907  WBC 7.2  < > 18.5*  < > 9.5  < > 11.4* 7.8 8.9  NEUTROABS 6.3  --  16.9*  --  7.8*  --   --   --   --   HGB 10.3*  < > 9.1*  < > 7.6*  < > 9.2* 8.9* 10.2*  HCT 31.9*  < > 29.2*  < > 24.7*  < > 29.7* 28.8* 33.0*  MCV 85.1  < > 89.3  < > 89.5  < > 89.2 89.2 89.9  PLT 271  < > 318  < > 261  < > 411* 403* 413*  < > = values in this interval not displayed. Lab Results  Component Value Date   TSH 2.620 11/13/2015   Lab Results  Component Value Date   HGBA1C 6.8* 12/14/2015   Lab Results  Component Value Date   CHOL 105 12/14/2015   HDL 37* 12/14/2015   LDLCALC 53 12/14/2015   TRIG 76 12/14/2015   CHOLHDL 2.8 12/14/2015    Significant Diagnostic Results in last 30 days:  Dg Chest 2 View  02/03/2016  CLINICAL DATA:  82 year old nursing home patient  presenting with acute onset of cough, fever, shortness of breath and chest pain which began last night. EXAM: CHEST  2 VIEW COMPARISON:  01/09/2016 and earlier. FINDINGS: AP semi-erect and lateral images were obtained. Prior sternotomy for CABG. Cardiac silhouette markedly enlarged, unchanged. Thoracic aorta atherosclerotic and tortuous, unchanged. Hilar and mediastinal contours otherwise unremarkable. Dense airspace consolidation in the right upper lobe, right lower lobe and right middle lobe associated with a moderate-sized right pleural effusion. Minimal patchy airspace opacities in the left lower lobe. Mild  pulmonary venous hypertension without overt edema. IMPRESSION: Acute pneumonia involving the right upper lobe, right middle lobe, right lower lobe and to a lesser degree the left lower lobe. Moderate size right parapneumonic effusion. Electronically Signed   By: Evangeline Dakin M.D.   On: 02/03/2016 14:03   Ct Angio Chest Pe W/cm &/or Wo Cm  02/04/2016  CLINICAL DATA:  82 year old male with chest pain, shortness of breath and lower extremity DVT. EXAM: CT ANGIOGRAPHY CHEST WITH CONTRAST TECHNIQUE: Multidetector CT imaging of the chest was performed using the standard protocol during bolus administration of intravenous contrast. Multiplanar CT image reconstructions and MIPs were obtained to evaluate the vascular anatomy. CONTRAST:  124mL OMNIPAQUE IOHEXOL 350 MG/ML SOLN COMPARISON:  02/03/2016 and prior radiographs. 10/24/2014 noncontrast chest CT. FINDINGS: This is a technically satisfactory study. Mediastinum/Nodes: Pulmonary emboli are identified within the distal main right pulmonary artery, right upper and lower lobes and left lower lobe. RV/LV ratio is 1.1, compatible with right heart strain. Cardiomegaly, coronary disease and cardiac surgical changes noted. There is no evidence of thoracic aortic aneurysm. No pericardial effusion or enlarged lymph nodes identified. Lungs/Pleura: A moderate right  pleural effusion is identified. Focal consolidation within the right upper lobe and right lower lobe noted and may represent atelectasis/ infarct changes and/or infection. Moderate centrilobular and paraseptal emphysema noted. Patchy left lower lobe basilar opacity probably represents atelectasis/infarct changes. There is no evidence of pneumothorax. Upper abdomen: No acute abnormalities. Musculoskeletal: No acute or suspicious abnormalities. Review of the MIP images confirms the above findings. IMPRESSION: Bilateral pulmonary emboli with CT evidence of right heart strain (RV/LV Ratio = 1.1) consistent with at least submassive (intermediate risk) PE. The presence of right heart strain has been associated with an increased risk of morbidity and mortality. Please activate Code PE by paging [phone]. Scattered right lung consolidation and mild left basilar opacity - question atelectasis/infarct changes and/ or pneumonia. Moderate right pleural effusion. Emphysema. Critical Value/emergent results were called by telephone at the time of interpretation on 02/04/2016 at 12:30 pm to [name], RN, who verbally acknowledged these results. Electronically Signed   By: Margarette Canada M.D.   On: 02/04/2016 12:31   [place] Art/ven Flow Abd Pelv Doppler  02/08/2016  CLINICAL DATA:  Acute liver failure. EXAM: DUPLEX ULTRASOUND OF LIVER TECHNIQUE: Color and duplex Doppler ultrasound was performed to evaluate the hepatic in-flow and out-flow vessels. COMPARISON:  None. FINDINGS: Portal Vein Velocities Main:  27 cm/sec Right:  25.8 cm/sec Left:  18 cm/sec Normal hepatopetal flow is noted in the portal veins. Hepatic Vein Velocities Right:  51.4 cm/sec Middle:  47.3 cm/sec Left:  67.2 cm/sec Normal hepatofugal flow is noted in hepatic veins. Hepatic Artery Velocity:  73.4 cm/sec Splenic Vein Velocity:  30.9 cm/sec Varices: Absent. Ascites: Absent. Spleen measures 9.2 x 6.9 x 3.3 cm in size. IMPRESSION: No evidence of portal, hepatic or splenic  venous thrombosis or occlusion. Electronically Signed   By: Marijo Conception, M.D.   On: 02/08/2016 15:17   Dg Chest Port 1 View  02/14/2016  CLINICAL DATA:  Status post right PICC placement. Initial encounter. EXAM: PORTABLE CHEST 1 VIEW COMPARISON:  Single view of the chest 02/10/2016 and CT chest 02/04/2016. FINDINGS: Right PICC is in place with the tip projecting over the lower superior vena cava. There has been marked worsening in the patient's right pleural effusion and airspace disease. The right chest is completely whited out. The left lung is clear. Heart size is normal. The patient is  rotated on the exam. IMPRESSION: Right hip scratch the tip of right PICC projects over the lower superior vena cava. Complete whiteout of the right chest consistent with worsened airspace disease and effusion. Electronically Signed   By: Inge Rise M.D.   On: 02/14/2016 14:08   Dg Chest Port 1 View  02/10/2016  CLINICAL DATA:  Shortness of breath. Unresponsive. History of hypertension, diabetes, prostate cancer, former smoker. EXAM: PORTABLE CHEST 1 VIEW COMPARISON:  02/03/2016 FINDINGS: Examination is limited due to patient rotation. Postoperative changes in the mediastinum. Cardiac enlargement without significant vascular congestion. Persistent infiltration and consolidation in the right lung with progression since previous study, likely pneumonia. Small right pleural effusion is developing. Left lung is clear and expanded. Calcified and tortuous aorta. IMPRESSION: Interval progression of right lung consolidation and infiltration as well as right pleural effusion. Electronically Signed   By: Lucienne Capers M.D.   On: 02/10/2016 05:58   US Abdomen Limited Ruq  02/08/2016  CLINICAL DATA:  Acute liver failure. Inpatient. Elevated liver function tests. Prostate cancer. EXAM: US ABDOMEN LIMITED - RIGHT UPPER QUADRANT COMPARISON:  01/17/2006 CT abdomen/pelvis. FINDINGS: Gallbladder: No gallstones or wall  thickening visualized. No sonographic Murphy sign noted by sonographer. Common bile duct: Diameter: 4 mm Liver: No focal lesion identified. Within normal limits in parenchymal echogenicity. Incidentally noted is a small right pleural effusion. IMPRESSION: 1. Incidental small right pleural effusion. 2. Otherwise normal right upper quadrant abdominal sonogram, with no cholelithiasis, no biliary ductal dilatation and sonographically normal liver. Electronically Signed   By: Ilona Sorrel M.D.   On: 02/08/2016 15:14    Assessment/Plan  HCAP (healthcare-associated pneumonia) Afebrile.VSS. His chest X-ray done 02/01/2016 showed patchy bibasilar densities right greater than the left compatible with pneumonia. He denies any chills, fever, chest pain, shortness of breath, wheezing.Will continue on Avelox 400 mg Tablet currently on day # 7. Agreed to complete current antibiotics dose then will follow up x-ray 02/06/2016. CBC/diff 02/05/16. Encouraged to get out of bed to chair daily for meals. Encouraged to use Incentive spirometry X 10 times every one hour throughout the day. monitor vital signs Q shift.     Family/ staff Communication: Reviewed plan of care with Patient, patient's wife and Pharmacist, hospital.   Labs/tests ordered:  Portable Chest  x-ray PA/Lat  02/06/2016. CBC/diff 02/05/16

## 2016-02-03 ENCOUNTER — Emergency Department (HOSPITAL_COMMUNITY): Payer: Medicare Other

## 2016-02-03 ENCOUNTER — Inpatient Hospital Stay (HOSPITAL_COMMUNITY)
Admission: EM | Admit: 2016-02-03 | Discharge: 2016-02-13 | DRG: 871 | Disposition: A | Discharge status: Other Acute Inpatient Hospital | Payer: Medicare Other | Attending: Internal Medicine | Admitting: Internal Medicine

## 2016-02-03 ENCOUNTER — Encounter (HOSPITAL_COMMUNITY): Payer: Self-pay

## 2016-02-03 DIAGNOSIS — I82403 Acute embolism and thrombosis of unspecified deep veins of lower extremity, bilateral: Secondary | ICD-10-CM | POA: Diagnosis not present

## 2016-02-03 DIAGNOSIS — E876 Hypokalemia: Secondary | ICD-10-CM | POA: Diagnosis present

## 2016-02-03 DIAGNOSIS — Y95 Nosocomial condition: Secondary | ICD-10-CM | POA: Diagnosis present

## 2016-02-03 DIAGNOSIS — K219 Gastro-esophageal reflux disease without esophagitis: Secondary | ICD-10-CM | POA: Diagnosis present

## 2016-02-03 DIAGNOSIS — J690 Pneumonitis due to inhalation of food and vomit: Secondary | ICD-10-CM | POA: Diagnosis present

## 2016-02-03 DIAGNOSIS — E7800 Pure hypercholesterolemia, unspecified: Secondary | ICD-10-CM | POA: Diagnosis present

## 2016-02-03 DIAGNOSIS — J189 Pneumonia, unspecified organism: Secondary | ICD-10-CM

## 2016-02-03 DIAGNOSIS — E119 Type 2 diabetes mellitus without complications: Secondary | ICD-10-CM

## 2016-02-03 DIAGNOSIS — K5900 Constipation, unspecified: Secondary | ICD-10-CM | POA: Diagnosis present

## 2016-02-03 DIAGNOSIS — I350 Nonrheumatic aortic (valve) stenosis: Secondary | ICD-10-CM | POA: Diagnosis present

## 2016-02-03 DIAGNOSIS — I2699 Other pulmonary embolism without acute cor pulmonale: Secondary | ICD-10-CM | POA: Diagnosis present

## 2016-02-03 DIAGNOSIS — G4733 Obstructive sleep apnea (adult) (pediatric): Secondary | ICD-10-CM | POA: Diagnosis present

## 2016-02-03 DIAGNOSIS — Z951 Presence of aortocoronary bypass graft: Secondary | ICD-10-CM | POA: Diagnosis not present

## 2016-02-03 DIAGNOSIS — R0602 Shortness of breath: Secondary | ICD-10-CM | POA: Diagnosis present

## 2016-02-03 DIAGNOSIS — J9811 Atelectasis: Secondary | ICD-10-CM | POA: Diagnosis present

## 2016-02-03 DIAGNOSIS — J449 Chronic obstructive pulmonary disease, unspecified: Secondary | ICD-10-CM | POA: Diagnosis present

## 2016-02-03 DIAGNOSIS — D649 Anemia, unspecified: Secondary | ICD-10-CM | POA: Diagnosis present

## 2016-02-03 DIAGNOSIS — A419 Sepsis, unspecified organism: Secondary | ICD-10-CM | POA: Diagnosis present

## 2016-02-03 DIAGNOSIS — K7200 Acute and subacute hepatic failure without coma: Secondary | ICD-10-CM

## 2016-02-03 DIAGNOSIS — I82491 Acute embolism and thrombosis of other specified deep vein of right lower extremity: Secondary | ICD-10-CM | POA: Diagnosis present

## 2016-02-03 DIAGNOSIS — I25709 Atherosclerosis of coronary artery bypass graft(s), unspecified, with unspecified angina pectoris: Secondary | ICD-10-CM | POA: Diagnosis not present

## 2016-02-03 DIAGNOSIS — Z9981 Dependence on supplemental oxygen: Secondary | ICD-10-CM

## 2016-02-03 DIAGNOSIS — R4189 Other symptoms and signs involving cognitive functions and awareness: Secondary | ICD-10-CM

## 2016-02-03 DIAGNOSIS — I82409 Acute embolism and thrombosis of unspecified deep veins of unspecified lower extremity: Secondary | ICD-10-CM | POA: Diagnosis present

## 2016-02-03 DIAGNOSIS — I82401 Acute embolism and thrombosis of unspecified deep veins of right lower extremity: Secondary | ICD-10-CM

## 2016-02-03 DIAGNOSIS — E1122 Type 2 diabetes mellitus with diabetic chronic kidney disease: Secondary | ICD-10-CM

## 2016-02-03 DIAGNOSIS — Z8546 Personal history of malignant neoplasm of prostate: Secondary | ICD-10-CM

## 2016-02-03 DIAGNOSIS — I69351 Hemiplegia and hemiparesis following cerebral infarction affecting right dominant side: Secondary | ICD-10-CM | POA: Diagnosis not present

## 2016-02-03 DIAGNOSIS — I10 Essential (primary) hypertension: Secondary | ICD-10-CM | POA: Diagnosis not present

## 2016-02-03 DIAGNOSIS — I82432 Acute embolism and thrombosis of left popliteal vein: Secondary | ICD-10-CM | POA: Diagnosis present

## 2016-02-03 DIAGNOSIS — Z87891 Personal history of nicotine dependence: Secondary | ICD-10-CM

## 2016-02-03 DIAGNOSIS — Z7901 Long term (current) use of anticoagulants: Secondary | ICD-10-CM | POA: Diagnosis not present

## 2016-02-03 DIAGNOSIS — R740 Nonspecific elevation of levels of transaminase and lactic acid dehydrogenase [LDH]: Secondary | ICD-10-CM | POA: Diagnosis present

## 2016-02-03 DIAGNOSIS — J90 Pleural effusion, not elsewhere classified: Secondary | ICD-10-CM

## 2016-02-03 DIAGNOSIS — I639 Cerebral infarction, unspecified: Secondary | ICD-10-CM | POA: Diagnosis present

## 2016-02-03 DIAGNOSIS — R7401 Elevation of levels of liver transaminase levels: Secondary | ICD-10-CM | POA: Diagnosis not present

## 2016-02-03 DIAGNOSIS — I272 Other secondary pulmonary hypertension: Secondary | ICD-10-CM | POA: Diagnosis present

## 2016-02-03 DIAGNOSIS — J9621 Acute and chronic respiratory failure with hypoxia: Secondary | ICD-10-CM | POA: Diagnosis present

## 2016-02-03 DIAGNOSIS — Z79899 Other long term (current) drug therapy: Secondary | ICD-10-CM | POA: Diagnosis not present

## 2016-02-03 DIAGNOSIS — N179 Acute kidney failure, unspecified: Secondary | ICD-10-CM | POA: Diagnosis present

## 2016-02-03 DIAGNOSIS — D62 Acute posthemorrhagic anemia: Secondary | ICD-10-CM | POA: Diagnosis present

## 2016-02-03 DIAGNOSIS — J918 Pleural effusion in other conditions classified elsewhere: Secondary | ICD-10-CM | POA: Diagnosis present

## 2016-02-03 DIAGNOSIS — Z7951 Long term (current) use of inhaled steroids: Secondary | ICD-10-CM | POA: Diagnosis not present

## 2016-02-03 DIAGNOSIS — Z794 Long term (current) use of insulin: Secondary | ICD-10-CM | POA: Diagnosis not present

## 2016-02-03 DIAGNOSIS — Z66 Do not resuscitate: Secondary | ICD-10-CM | POA: Diagnosis present

## 2016-02-03 DIAGNOSIS — R609 Edema, unspecified: Secondary | ICD-10-CM | POA: Diagnosis not present

## 2016-02-03 DIAGNOSIS — I429 Cardiomyopathy, unspecified: Secondary | ICD-10-CM | POA: Diagnosis present

## 2016-02-03 DIAGNOSIS — I5041 Acute combined systolic (congestive) and diastolic (congestive) heart failure: Secondary | ICD-10-CM | POA: Diagnosis present

## 2016-02-03 DIAGNOSIS — J440 Chronic obstructive pulmonary disease with acute lower respiratory infection: Secondary | ICD-10-CM | POA: Diagnosis present

## 2016-02-03 DIAGNOSIS — J9601 Acute respiratory failure with hypoxia: Secondary | ICD-10-CM | POA: Diagnosis present

## 2016-02-03 DIAGNOSIS — I129 Hypertensive chronic kidney disease with stage 1 through stage 4 chronic kidney disease, or unspecified chronic kidney disease: Secondary | ICD-10-CM | POA: Diagnosis present

## 2016-02-03 DIAGNOSIS — E785 Hyperlipidemia, unspecified: Secondary | ICD-10-CM | POA: Diagnosis present

## 2016-02-03 DIAGNOSIS — I25810 Atherosclerosis of coronary artery bypass graft(s) without angina pectoris: Secondary | ICD-10-CM | POA: Diagnosis present

## 2016-02-03 DIAGNOSIS — I248 Other forms of acute ischemic heart disease: Secondary | ICD-10-CM | POA: Diagnosis present

## 2016-02-03 DIAGNOSIS — N182 Chronic kidney disease, stage 2 (mild): Secondary | ICD-10-CM | POA: Diagnosis present

## 2016-02-03 DIAGNOSIS — J948 Other specified pleural conditions: Secondary | ICD-10-CM | POA: Diagnosis not present

## 2016-02-03 DIAGNOSIS — I428 Other cardiomyopathies: Secondary | ICD-10-CM

## 2016-02-03 DIAGNOSIS — I5032 Chronic diastolic (congestive) heart failure: Secondary | ICD-10-CM | POA: Diagnosis present

## 2016-02-03 HISTORY — DX: Methicillin susceptible Staphylococcus aureus infection as the cause of diseases classified elsewhere: R78.81

## 2016-02-03 HISTORY — DX: Methicillin susceptible Staphylococcus aureus infection as the cause of diseases classified elsewhere: B95.61

## 2016-02-03 HISTORY — DX: Acute embolism and thrombosis of unspecified deep veins of unspecified lower extremity: I82.409

## 2016-02-03 HISTORY — DX: Other ventricular tachycardia: I47.29

## 2016-02-03 HISTORY — DX: Ventricular tachycardia: I47.2

## 2016-02-03 HISTORY — DX: Chronic respiratory failure, unspecified whether with hypoxia or hypercapnia: J96.10

## 2016-02-03 HISTORY — DX: Chronic kidney disease, stage 2 (mild): N18.2

## 2016-02-03 HISTORY — DX: Pulmonary hypertension, unspecified: I27.20

## 2016-02-03 HISTORY — DX: Nonrheumatic aortic (valve) stenosis: I35.0

## 2016-02-03 HISTORY — DX: Chronic diastolic (congestive) heart failure: I50.32

## 2016-02-03 LAB — URINALYSIS, ROUTINE W REFLEX MICROSCOPIC
Bilirubin Urine: NEGATIVE
Glucose, UA: NEGATIVE mg/dL
HGB URINE DIPSTICK: NEGATIVE
KETONES UR: NEGATIVE mg/dL
Leukocytes, UA: NEGATIVE
Nitrite: NEGATIVE
PROTEIN: 30 mg/dL — AB
Specific Gravity, Urine: 1.019 (ref 1.005–1.030)
pH: 6 (ref 5.0–8.0)

## 2016-02-03 LAB — CBC WITH DIFFERENTIAL/PLATELET
BASOS PCT: 0 %
Basophils Absolute: 0 10*3/uL (ref 0.0–0.1)
EOS PCT: 0 %
Eosinophils Absolute: 0 10*3/uL (ref 0.0–0.7)
HEMATOCRIT: 29.2 % — AB (ref 39.0–52.0)
Hemoglobin: 9.1 g/dL — ABNORMAL LOW (ref 13.0–17.0)
LYMPHS ABS: 0.7 10*3/uL (ref 0.7–4.0)
Lymphocytes Relative: 4 %
MCH: 27.8 pg (ref 26.0–34.0)
MCHC: 31.2 g/dL (ref 30.0–36.0)
MCV: 89.3 fL (ref 78.0–100.0)
MONO ABS: 0.9 10*3/uL (ref 0.1–1.0)
MONOS PCT: 5 %
NEUTROS ABS: 16.9 10*3/uL — AB (ref 1.7–7.7)
Neutrophils Relative %: 91 %
PLATELETS: 318 10*3/uL (ref 150–400)
RBC: 3.27 MIL/uL — AB (ref 4.22–5.81)
RDW: 22.1 % — AB (ref 11.5–15.5)
WBC: 18.5 10*3/uL — ABNORMAL HIGH (ref 4.0–10.5)

## 2016-02-03 LAB — BLOOD GAS, ARTERIAL
Acid-base deficit: 4.4 mmol/L — ABNORMAL HIGH (ref 0.0–2.0)
Bicarbonate: 17.9 mEq/L — ABNORMAL LOW (ref 20.0–24.0)
Drawn by: 246101
O2 Content: 3 L/min
O2 Saturation: 94.4 %
PATIENT TEMPERATURE: 98.6
PCO2 ART: 24.1 mmHg — AB (ref 35.0–45.0)
PH ART: 7.484 — AB (ref 7.350–7.450)
PO2 ART: 74.6 mmHg — AB (ref 80.0–100.0)
TCO2: 16.8 mmol/L (ref 0–100)

## 2016-02-03 LAB — LACTIC ACID, PLASMA
LACTIC ACID, VENOUS: 2.5 mmol/L — AB (ref 0.5–2.0)
Lactic Acid, Venous: 1.7 mmol/L (ref 0.5–2.0)

## 2016-02-03 LAB — APTT: aPTT: 33 seconds (ref 24–37)

## 2016-02-03 LAB — GLUCOSE, CAPILLARY: Glucose-Capillary: 250 mg/dL — ABNORMAL HIGH (ref 65–99)

## 2016-02-03 LAB — COMPREHENSIVE METABOLIC PANEL
ALT: 39 U/L (ref 17–63)
AST: 46 U/L — AB (ref 15–41)
Albumin: 2.9 g/dL — ABNORMAL LOW (ref 3.5–5.0)
Alkaline Phosphatase: 138 U/L — ABNORMAL HIGH (ref 38–126)
Anion gap: 13 (ref 5–15)
BUN: 21 mg/dL — AB (ref 6–20)
CHLORIDE: 103 mmol/L (ref 101–111)
CO2: 19 mmol/L — AB (ref 22–32)
CREATININE: 1.54 mg/dL — AB (ref 0.61–1.24)
Calcium: 8.6 mg/dL — ABNORMAL LOW (ref 8.9–10.3)
GFR calc Af Amer: 46 mL/min — ABNORMAL LOW (ref >60)
GFR, EST NON AFRICAN AMERICAN: 39 mL/min — AB (ref >60)
GLUCOSE: 190 mg/dL — AB (ref 65–99)
POTASSIUM: 5.2 mmol/L — AB (ref 3.5–5.1)
Sodium: 135 mmol/L (ref 135–145)
Total Bilirubin: 1.2 mg/dL (ref 0.3–1.2)
Total Protein: 8.1 g/dL (ref 6.5–8.1)

## 2016-02-03 LAB — URINE MICROSCOPIC-ADD ON

## 2016-02-03 LAB — I-STAT CG4 LACTIC ACID, ED
LACTIC ACID, VENOUS: 3.04 mmol/L — AB (ref 0.5–2.0)
Lactic Acid, Venous: 1.29 mmol/L (ref 0.5–2.0)

## 2016-02-03 LAB — PROTIME-INR
INR: 1.57 — AB (ref 0.00–1.49)
PROTHROMBIN TIME: 18.8 s — AB (ref 11.6–15.2)

## 2016-02-03 LAB — PROCALCITONIN: PROCALCITONIN: 6.21 ng/mL

## 2016-02-03 LAB — MRSA PCR SCREENING: MRSA by PCR: NEGATIVE

## 2016-02-03 MED ORDER — ALBUTEROL SULFATE (2.5 MG/3ML) 0.083% IN NEBU
2.5000 mg | INHALATION_SOLUTION | RESPIRATORY_TRACT | Status: DC | PRN
  Administered 2016-02-03: 2.5 mg via RESPIRATORY_TRACT
  Filled 2016-02-03: qty 3

## 2016-02-03 MED ORDER — ACETAMINOPHEN 325 MG PO TABS
650.0000 mg | ORAL_TABLET | Freq: Four times a day (QID) | ORAL | Status: DC | PRN
  Administered 2016-02-03 – 2016-02-09 (×2): 650 mg via ORAL
  Filled 2016-02-03 (×3): qty 2

## 2016-02-03 MED ORDER — DEXTROSE 5 % IV SOLN
1.0000 g | Freq: Once | INTRAVENOUS | Status: AC
  Administered 2016-02-03: 1 g via INTRAVENOUS
  Filled 2016-02-03: qty 1

## 2016-02-03 MED ORDER — OXYBUTYNIN CHLORIDE ER 5 MG PO TB24
5.0000 mg | ORAL_TABLET | Freq: Every day | ORAL | Status: DC
Start: 2016-02-04 — End: 2016-02-13
  Administered 2016-02-04 – 2016-02-13 (×9): 5 mg via ORAL
  Filled 2016-02-03 (×10): qty 1

## 2016-02-03 MED ORDER — VANCOMYCIN HCL 10 G IV SOLR
1250.0000 mg | INTRAVENOUS | Status: AC
  Administered 2016-02-03: 1250 mg via INTRAVENOUS
  Filled 2016-02-03: qty 1250

## 2016-02-03 MED ORDER — LATANOPROST 0.005 % OP SOLN
1.0000 [drp] | Freq: Every day | OPHTHALMIC | Status: DC
Start: 2016-02-03 — End: 2016-02-13
  Administered 2016-02-03 – 2016-02-12 (×10): 1 [drp] via OPHTHALMIC
  Filled 2016-02-03: qty 2.5

## 2016-02-03 MED ORDER — ONDANSETRON HCL 4 MG PO TABS: 4.0000 mg | ORAL_TABLET | Freq: Four times a day (QID) | ORAL | Status: DC | PRN

## 2016-02-03 MED ORDER — DEXTROSE 5 % IV SOLN
2.0000 g | INTRAVENOUS | Status: DC
  Administered 2016-02-03 – 2016-02-09 (×7): 2 g via INTRAVENOUS
  Filled 2016-02-03 (×7): qty 2

## 2016-02-03 MED ORDER — METOPROLOL SUCCINATE ER 25 MG PO TB24
25.0000 mg | ORAL_TABLET | Freq: Every day | ORAL | Status: DC
  Administered 2016-02-04 – 2016-02-07 (×4): 25 mg via ORAL
  Filled 2016-02-03 (×4): qty 1

## 2016-02-03 MED ORDER — SODIUM CHLORIDE 0.9 % IV BOLUS (SEPSIS)
1000.0000 mL | Freq: Once | INTRAVENOUS | Status: AC
Start: 2016-02-03 — End: 2016-02-03
  Administered 2016-02-03: 1000 mL via INTRAVENOUS

## 2016-02-03 MED ORDER — IPRATROPIUM BROMIDE 0.02 % IN SOLN
0.5000 mg | Freq: Four times a day (QID) | RESPIRATORY_TRACT | Status: DC
  Administered 2016-02-03: 0.5 mg via RESPIRATORY_TRACT
  Filled 2016-02-03: qty 2.5

## 2016-02-03 MED ORDER — ROSUVASTATIN CALCIUM 20 MG PO TABS
20.0000 mg | ORAL_TABLET | Freq: Every day | ORAL | Status: DC
  Administered 2016-02-03 – 2016-02-04 (×2): 20 mg via ORAL
  Filled 2016-02-03 (×3): qty 1

## 2016-02-03 MED ORDER — ACETAMINOPHEN 650 MG RE SUPP: 650.0000 mg | Freq: Four times a day (QID) | RECTAL | Status: DC | PRN

## 2016-02-03 MED ORDER — ALBUTEROL SULFATE (2.5 MG/3ML) 0.083% IN NEBU
2.5000 mg | INHALATION_SOLUTION | Freq: Four times a day (QID) | RESPIRATORY_TRACT | Status: DC | PRN
  Administered 2016-02-04: 2.5 mg via RESPIRATORY_TRACT
  Filled 2016-02-03: qty 3

## 2016-02-03 MED ORDER — VANCOMYCIN HCL 10 G IV SOLR
1250.0000 mg | INTRAVENOUS | Status: DC
  Administered 2016-02-04 – 2016-02-05 (×2): 1250 mg via INTRAVENOUS
  Filled 2016-02-03 (×3): qty 1250

## 2016-02-03 MED ORDER — ACETAMINOPHEN 325 MG PO TABS
650.0000 mg | ORAL_TABLET | Freq: Once | ORAL | Status: AC
  Administered 2016-02-03: 650 mg via ORAL
  Filled 2016-02-03: qty 2

## 2016-02-03 MED ORDER — IPRATROPIUM-ALBUTEROL 0.5-2.5 (3) MG/3ML IN SOLN
3.0000 mL | Freq: Four times a day (QID) | RESPIRATORY_TRACT | Status: DC
  Administered 2016-02-04: 3 mL via RESPIRATORY_TRACT
  Filled 2016-02-03: qty 3

## 2016-02-03 MED ORDER — CLOPIDOGREL BISULFATE 75 MG PO TABS
75.0000 mg | ORAL_TABLET | Freq: Every day | ORAL | Status: DC
  Administered 2016-02-04 – 2016-02-05 (×2): 75 mg via ORAL
  Filled 2016-02-03 (×2): qty 1

## 2016-02-03 MED ORDER — FERROUS SULFATE 325 (65 FE) MG PO TABS
325.0000 mg | ORAL_TABLET | Freq: Two times a day (BID) | ORAL | Status: DC
  Administered 2016-02-04 – 2016-02-13 (×17): 325 mg via ORAL
  Filled 2016-02-03 (×20): qty 1

## 2016-02-03 MED ORDER — INSULIN GLARGINE 100 UNIT/ML ~~LOC~~ SOLN
25.0000 [IU] | Freq: Every day | SUBCUTANEOUS | Status: DC
Start: 2016-02-03 — End: 2016-02-06
  Administered 2016-02-03 – 2016-02-05 (×3): 25 [IU] via SUBCUTANEOUS
  Filled 2016-02-03 (×3): qty 0.25

## 2016-02-03 MED ORDER — SODIUM CHLORIDE 0.9% FLUSH
3.0000 mL | Freq: Two times a day (BID) | INTRAVENOUS | Status: DC
Start: 2016-02-03 — End: 2016-02-13
  Administered 2016-02-03 – 2016-02-13 (×17): 3 mL via INTRAVENOUS

## 2016-02-03 MED ORDER — HEPARIN SODIUM (PORCINE) 5000 UNIT/ML IJ SOLN
5000.0000 [IU] | Freq: Three times a day (TID) | INTRAMUSCULAR | Status: DC
  Administered 2016-02-03 – 2016-02-04 (×2): 5000 [IU] via SUBCUTANEOUS
  Filled 2016-02-03 (×2): qty 1

## 2016-02-03 MED ORDER — SODIUM CHLORIDE 0.9 % IV BOLUS (SEPSIS)
1000.0000 mL | INTRAVENOUS | Status: AC
  Administered 2016-02-03: 1000 mL via INTRAVENOUS

## 2016-02-03 MED ORDER — ONDANSETRON HCL 4 MG/2ML IJ SOLN: 4.0000 mg | Freq: Four times a day (QID) | INTRAMUSCULAR | Status: DC | PRN

## 2016-02-03 MED ORDER — SODIUM CHLORIDE 0.9 % IV BOLUS (SEPSIS)
500.0000 mL | INTRAVENOUS | Status: AC
  Administered 2016-02-03: 500 mL via INTRAVENOUS

## 2016-02-03 MED ORDER — DOCUSATE SODIUM 100 MG PO CAPS
100.0000 mg | ORAL_CAPSULE | Freq: Two times a day (BID) | ORAL | Status: DC
  Administered 2016-02-05 – 2016-02-12 (×15): 100 mg via ORAL
  Filled 2016-02-03 (×19): qty 1

## 2016-02-03 MED ORDER — SODIUM CHLORIDE 0.9 % IV SOLN
INTRAVENOUS | Status: DC
  Administered 2016-02-03 – 2016-02-04 (×2): via INTRAVENOUS

## 2016-02-03 NOTE — ED Notes (Signed)
Pt voided in bed. Pt cleaned up and pad changed.

## 2016-02-03 NOTE — ED Provider Notes (Signed)
CSN: [id number]     Arrival date & time 02/03/16  1228 History   First MD Initiated Contact with Patient 02/03/16 1254     Chief Complaint  Patient presents with  . Shortness of Breath  . Cough      HPI Per EMS, pt from [facility]. Pt with cough/fever/shortness of breath since last night. Fever 102.1. Rt flank pain increased with palpation. Pt is alert and oriented. No breathing tx in route. Vitals: 122/54, hr 102, 93% on 3L (home dosing), resp 20 Past Medical History  Diagnosis Date  . Hypertension   . High cholesterol   . Prostate cancer ([place])   . CAD (coronary artery disease)   . OSA (obstructive sleep apnea)   . COPD (chronic obstructive pulmonary disease) (HCC)     MODERATE  . Diabetes mellitus ([place])   . GERD (gastroesophageal reflux disease)   . Vertigo   . Arthritis     OA  . Anemia   . CHF (congestive heart failure) (HCC)     grade 2 diastolic dysfuction  . CKD (chronic kidney disease)   . Stroke (cerebrum) ([place])     11/2015  . HCAP (healthcare-associated pneumonia)     12/2015   Past Surgical History  Procedure Laterality Date  . Coronary artery bypass graft  03/29/1999    x 4:  LIMA to the LAD,SVG to the diagonal branch of the LAD,SVG to the intermediate coronary artery & SVG to the posterior descending branch of the RCA.  . Colon surgery      x 4  . Nm myocar perf wall motion  04/28/2012    Low Risk Scan  . Prostate surgery    . Cardiac catheterization    . Cardiac catheterization N/A 12/12/2015    Procedure: Right/Left Heart Cath and Coronary/Graft Angiography;  Surgeon: Troy Sine, MD;  Location: [place] [facility];  Service: Cardiovascular;  Laterality: N/A;  . Peripheral vascular catheterization N/A 12/12/2015    Procedure: Aortic Arch Angiography;  Surgeon: Troy Sine, MD;  Location: [place] [facility];  Service: Cardiovascular;  Laterality: N/A;  . Eye surgery    . Tee without cardioversion N/A 01/12/2016    Procedure: TRANSESOPHAGEAL  ECHOCARDIOGRAM (TEE);  Surgeon: Sanda Klein, MD;  Location[facility] ENDOSCOPY;  Service: Cardiovascular;  Laterality: N/A;   Family History  Problem Relation Age of Onset  . Kidney disease Brother   . Breast cancer Sister   . Rheum arthritis Mother    Social History  Substance Use Topics  . Smoking status: Former Smoker -- 1.00 packs/day for 30 years    Types: Cigarettes    Quit date: 12/24/1983  . Smokeless tobacco: Never Used     Comment: quit smoking in 1985  . Alcohol Use: 0.0 oz/week    0 Standard drinks or equivalent per week     Comment: rarely    Review of Systems  Unable to perform ROS: Acuity of condition      Allergies  Mold extract and Lipitor  Home Medications   Prior to Admission medications   Medication Sig Start Date End Date Taking? Authorizing Provider  acetaminophen (TYLENOL) 325 MG tablet Take 650 mg by mouth every 4 (four) hours as needed for mild pain or headache. 02/03/16 02/05/16 Yes Historical Provider, MD  Amino Acids-Protein Hydrolys (FEEDING SUPPLEMENT, PRO-STAT SUGAR FREE 64,) LIQD Take 30 mLs by mouth 2 (two) times daily.   Yes Historical Provider, MD  b complex vitamins capsule Take  1 capsule by mouth daily.   Yes Historical Provider, MD  benzonatate (TESSALON) 100 MG capsule Take 100 mg by mouth 3 (three) times daily. 11/24/15  Yes Historical Provider, MD  CRESTOR 20 MG tablet Take 20 mg by mouth daily.  10/01/13  Yes Historical Provider, MD  ferrous sulfate 325 (65 FE) MG tablet Take 325 mg by mouth 2 (two) times daily with a meal.   Yes Historical Provider, MD  furosemide (LASIX) 40 MG tablet TAKE 1 TABLET DAILY 11/27/15  Yes Troy Sine, MD  HYDROcodone-acetaminophen (NORCO/VICODIN) 5-325 MG tablet Take 1-2 tablets by mouth every 4 (four) hours as needed for moderate pain. 01/17/16  Yes Monica Carter, DO  HYDROcodone-homatropine (HYCODAN) 5-1.5 MG/5ML syrup Take 5 mLs by mouth every 6 (six) hours as needed for cough. 01/17/16  Yes Monica Carter,  DO  insulin glargine (LANTUS) 100 UNIT/ML injection Inject 0.25 mLs (25 Units total) into the skin daily. 01/16/16  Yes Reyne Dumas, MD  insulin lispro (HUMALOG) 100 UNIT/ML injection Inject into the skin 3 (three) times daily before meals. 60-149= 0 units 150-250= 5 units Cbg>250 = 8 units   Yes Historical Provider, MD  latanoprost (XALATAN) 0.005 % ophthalmic solution Place 1 drop into both eyes at bedtime. 08/25/13  Yes Historical Provider, MD  Magnesium Oxide -Mg Supplement 250 MG TABS Take 250 mg by mouth every morning.   Yes Historical Provider, MD  metoprolol succinate (TOPROL-XL) 100 MG 24 hr tablet Take 1/2 tablet daily 12/29/15  Yes Ivan Anchors Love, PA-C  moxifloxacin (AVELOX) 400 MG tablet Take 400 mg by mouth daily. For 10 days 01/26/16 02/04/16 Yes Historical Provider, MD  Multiple Vitamin (MULTIVITAMIN WITH MINERALS) TABS tablet Take 1 tablet by mouth daily.   Yes Historical Provider, MD  Multiple Vitamins-Minerals (CENTRUM SILVER ULTRA MENS PO) Take 1 tablet by mouth daily.   Yes Historical Provider, MD  oxybutynin (DITROPAN-XL) 5 MG 24 hr tablet Take 5 mg by mouth daily.    Yes Historical Provider, MD  pantoprazole (PROTONIX) 40 MG tablet Take 40 mg by mouth daily.   Yes Historical Provider, MD  potassium chloride SA (KLOR-CON M20) 20 MEQ tablet Take 3 tablets daily. Patient taking differently: Take 60 mEq by mouth daily. Take 3 tablets daily. 12/29/15  Yes Pamela S Love, PA-C  PROAIR HFA 108 (90 BASE) MCG/ACT inhaler Inhale 2 puffs into the lungs every 6 (six) hours as needed for wheezing or shortness of breath.    Yes Historical Provider, MD  RESTASIS 0.05 % ophthalmic emulsion Place 1 drop into both eyes 2 (two) times daily. 10/01/13  Yes Historical Provider, MD  Spacer/Aero-Holding Chambers (AEROCHAMBER PLUS FLO-VU) [place]  10/10/14  Yes Historical Provider, MD  STIOLTO RESPIMAT 2.5-2.5 MCG/ACT AERS USE 2 INHALATIONS ORALLY   DAILY 12/27/15  Yes Brand Males, MD  vitamin C (ASCORBIC ACID)  500 MG tablet Take 500 mg by mouth 2 (two) times daily.   Yes Historical Provider, MD  Vitamin D, Ergocalciferol, (DRISDOL) 50000 UNITS CAPS capsule Take 50,000 Units by mouth every 14 (fourteen) days. Every other week on Sunday 11/08/13  Yes Historical Provider, MD  vitamin E 400 UNIT capsule Take 400 Units by mouth daily.   Yes Historical Provider, MD  ZETIA 10 MG tablet Take 10 mg by mouth daily. 10/30/15  Yes Historical Provider, MD  B-D ULTRAFINE III SHORT PEN 31G X 8 MM MISC Reported on 01/18/2016 10/19/14   Historical Provider, MD  clopidogrel (PLAVIX) 75 MG tablet TAKE 1  TABLET DAILY 01/25/16   Troy Sine, MD  insulin aspart (NOVOLOG) 100 UNIT/ML injection  Correction coverage: Moderate (average weight, post-op)    CBG < 70: implement hypoglycemia protocol    CBG 70 - 120: 0 units    CBG 121 - 150: 2 units    CBG 151 - 200: 3 units    CBG 201 - 250: 5 units    CBG 251 - 300: 8 units    CBG 301 - 350: 11 units    CBG 351 - 400: 15 units    CBG > 400 call MD and obtain STAT lab verification 01/16/16   Reyne Dumas, MD  losartan-hydrochlorothiazide (HYZAAR) 100-25 MG tablet TAKE 1 TABLET DAILY 01/25/16   Troy Sine, MD  [facility] DELICA LANCETS [id number] MISC 1 each by Other route 2 (two) times daily. Reported on 01/18/2016 09/03/15   Historical Provider, MD  Glory Rosebush VERIO test strip 1 strip by Other route 2 (two) times daily. Reported on 01/18/2016 09/03/15   Historical Provider, MD  pioglitazone (ACTOS) 45 MG tablet Take 45 mg by mouth daily. 12/24/15   Historical Provider, MD   BP 118/54 mmHg  Pulse 102  Temp(Src) 103.2 F (39.6 C) (Rectal)  Resp 33  SpO2 97% Physical Exam  Constitutional: He appears well-developed and well-nourished. He appears lethargic. He appears ill. No distress.  HENT:  Head: Normocephalic and atraumatic.  Eyes: Pupils are equal, round, and reactive to light.  Neck: Normal range of motion.  Cardiovascular: Normal rate and intact distal pulses.    Pulmonary/Chest: No respiratory distress. He has wheezes. He has rales.  Abdominal: Normal appearance. He exhibits no distension.  Musculoskeletal: Normal range of motion.  Neurological: He appears lethargic. A cranial nerve deficit is present.  Skin: Skin is warm and dry. No rash noted.  Psychiatric: He has a normal mood and affect. His behavior is normal.  Nursing note and vitals reviewed.   ED Course  Procedures (including critical care time) Medications  ceFEPIme (MAXIPIME) 1 g in dextrose 5 % 50 mL IVPB (not administered)  vancomycin (VANCOCIN) 1,250 mg in sodium chloride 0.9 % 250 mL IVPB (not administered)  sodium chloride 0.9 % bolus 1,000 mL (not administered)  acetaminophen (TYLENOL) tablet 650 mg (not administered)    Labs Review Labs Reviewed  COMPREHENSIVE METABOLIC PANEL - Abnormal; Notable for the following:    Potassium 5.2 (*)    CO2 19 (*)    Glucose, Bld 190 (*)    BUN 21 (*)    Creatinine, Ser 1.54 (*)    Calcium 8.6 (*)    Albumin 2.9 (*)    AST 46 (*)    Alkaline Phosphatase 138 (*)    GFR calc non Af Amer 39 (*)    GFR calc Af Amer 46 (*)    All other components within normal limits  CBC WITH DIFFERENTIAL/PLATELET - Abnormal; Notable for the following:    WBC 18.5 (*)    RBC 3.27 (*)    Hemoglobin 9.1 (*)    HCT 29.2 (*)    RDW 22.1 (*)    All other components within normal limits  I-STAT CG4 LACTIC ACID, ED - Abnormal; Notable for the following:    Lactic Acid, Venous 3.04 (*)    All other components within normal limits  CULTURE, BLOOD (ROUTINE X 2)  CULTURE, BLOOD (ROUTINE X 2)  URINE CULTURE  URINALYSIS, ROUTINE W REFLEX MICROSCOPIC (NOT AT [facility])    Imaging Review Dg  Chest 2 View  02/03/2016  CLINICAL DATA:  82 year old nursing home patient presenting with acute onset of cough, fever, shortness of breath and chest pain which began last night. EXAM: CHEST  2 VIEW COMPARISON:  01/09/2016 and earlier. FINDINGS: AP semi-erect and lateral  images were obtained. Prior sternotomy for CABG. Cardiac silhouette markedly enlarged, unchanged. Thoracic aorta atherosclerotic and tortuous, unchanged. Hilar and mediastinal contours otherwise unremarkable. Dense airspace consolidation in the right upper lobe, right lower lobe and right middle lobe associated with a moderate-sized right pleural effusion. Minimal patchy airspace opacities in the left lower lobe. Mild pulmonary venous hypertension without overt edema. IMPRESSION: Acute pneumonia involving the right upper lobe, right middle lobe, right lower lobe and to a lesser degree the left lower lobe. Moderate size right parapneumonic effusion. Electronically Signed   By: Evangeline Dakin M.D.   On: 02/03/2016 14:03   I have personally reviewed and evaluated these images and lab results as part of my medical decision-making.  CRITICAL CARE Performed by: Dot Lanes Total critical care time: 30 minutes Critical care time was exclusive of separately billable procedures and treating other patients. Critical care was necessary to treat or prevent imminent or life-threatening deterioration. Critical care was time spent personally by me on the following activities: development of treatment plan with patient and/or surrogate as well as nursing, discussions with consultants, evaluation of patient's response to treatment, examination of patient, obtaining history from patient or surrogate, ordering and performing treatments and interventions, ordering and review of laboratory studies, ordering and review of radiographic studies, pulse oximetry and re-evaluation of patient's condition.   MDM   Final diagnoses:  HCAP (healthcare-associated pneumonia)        Leonard Schwartz, MD 02/03/16 1431

## 2016-02-03 NOTE — ED Notes (Signed)
Bed: [id number] Expected date:  Expected time:  Means of arrival:  Comments: Ems- 82 yo cough, SOB

## 2016-02-03 NOTE — ED Notes (Signed)
Pt given urinal and made aware of need for UA 

## 2016-02-03 NOTE — Progress Notes (Signed)
Pharmacy Antibiotic Note  Douglas Carrillo is a 82 y.o. male admitted on 02/03/2016 with pneumonia.  Pharmacy has been consulted for vancomycin dosing.  He presents from [facility] with fever, cough and SHoB. CXR shows acute pneumonia involving the RUL, RML, and RLL.  Pharmacy is consulted to dose vancomycin and cefepime.  Patient was recently hospitalized from 1/17-1/24 with sepsis from MRSA bacteremia and was on IV vancomycin until 01/25/16.  Dosage at the time was 1g q12h, but SCr is currently increased therefore will need to decrease dose.   Plan:  Vancomycin 1250mg  IV q24h Check trough at steady state Follow up renal function & cultures, clinical progress Continue cefepime on admission?    Temp (24hrs), Avg:100.9 F (38.3 C), Min:98.5 F (36.9 C), Max:103.2 F (39.6 C)   Recent Labs Lab 02/03/16 1310 02/03/16 1322  WBC 18.5*  --   CREATININE 1.54*  --   LATICACIDVEN  --  3.04*    Estimated Creatinine Clearance: 38.5 mL/min (by C-G formula based on Cr of 1.54).    Allergies  Allergen Reactions  . Mold Extract [Trichophyton] Anaphylaxis  . Lipitor [Atorvastatin] Other (See Comments)    Weakness in legs/myalgias    Antimicrobials this admission: 2/11 >> vancomycin >> 2/11 >> cefepime x 1 >>  Dose adjustments this admission: ---  Microbiology results: 1/17 BCx2: MRSA (Vanc MIC < 0.5) 2/11 BCx: sent 2/11 UCx: sent  Thank you for allowing pharmacy to be a part of this patient's care.  Peggyann Juba, PharmD, BCPS Pager: [phone] 02/03/2016 2:33 PM

## 2016-02-03 NOTE — H&P (Signed)
[facility] History and Physical  Cinsere Mizrahi TKP:[id number] DOB: [birth year] DOA: 02/03/2016   PCP: Limmie Patricia, MD   Chief Complaint: Shortness of breath, cough for the last few days  HPI: Douglas Carrillo is a 82 y.o. male with the past medical history of hypertension, coronary artery disease, is oxygen dependent COPD, chronic kidney disease, severe aortic stenosis, recent stay in the hospital for Staphylococcus aureus bacteremia and healthcare associated pneumonia, presents from his skilled nursing facility with the complaints of shortness of breath, cough and fever. Patient is accompanied by his daughter and granddaughter. He mentions that after he was discharged on January 24, he was feeling well. He was feeling well for the first few days but then started getting weak again. And then a few days later started developing a cough. This has been progressively getting worse. He does not bring up anything when he coughs. Has had some chest pain. Has been wheezing. Felt weak. And then he had a fever today, which prompted his arrival to the hospital. No nausea or vomiting. Occasionally, he feels as if he does choke on his food. He was seen by speech therapy during his last hospitalization.  Evaluation in the emergency department has revealed right-sided pneumonia. His lactic acid level was elevated. He was slightly tachycardic.  Home Medications: Prior to Admission medications   Medication Sig Start Date End Date Taking? Authorizing Provider  acetaminophen (TYLENOL) 325 MG tablet Take 650 mg by mouth every 4 (four) hours as needed for mild pain or headache. 02/03/16 02/05/16 Yes Historical Provider, MD  Amino Acids-Protein Hydrolys (FEEDING SUPPLEMENT, PRO-STAT SUGAR FREE 64,) LIQD Take 30 mLs by mouth 2 (two) times daily.   Yes Historical Provider, MD  b complex vitamins capsule Take 1 capsule by mouth daily.   Yes Historical Provider, MD  B-D ULTRAFINE III SHORT PEN 31G X 8 MM MISC  Reported on 01/18/2016 10/19/14  Yes Historical Provider, MD  benzonatate (TESSALON) 100 MG capsule Take 100 mg by mouth 3 (three) times daily. 11/24/15  Yes Historical Provider, MD  CRESTOR 20 MG tablet Take 20 mg by mouth daily.  10/01/13  Yes Historical Provider, MD  ferrous sulfate 325 (65 FE) MG tablet Take 325 mg by mouth 2 (two) times daily with a meal.   Yes Historical Provider, MD  furosemide (LASIX) 40 MG tablet TAKE 1 TABLET DAILY 11/27/15  Yes Troy Sine, MD  HYDROcodone-acetaminophen (NORCO/VICODIN) 5-325 MG tablet Take 1-2 tablets by mouth every 4 (four) hours as needed for moderate pain. 01/17/16  Yes Monica Carter, DO  HYDROcodone-homatropine (HYCODAN) 5-1.5 MG/5ML syrup Take 5 mLs by mouth every 6 (six) hours as needed for cough. 01/17/16  Yes Monica Carter, DO  insulin glargine (LANTUS) 100 UNIT/ML injection Inject 0.25 mLs (25 Units total) into the skin daily. 01/16/16  Yes Reyne Dumas, MD  insulin lispro (HUMALOG) 100 UNIT/ML injection Inject into the skin 3 (three) times daily before meals. 60-149= 0 units 150-250= 5 units Cbg>250 = 8 units   Yes Historical Provider, MD  latanoprost (XALATAN) 0.005 % ophthalmic solution Place 1 drop into both eyes at bedtime. 08/25/13  Yes Historical Provider, MD  Magnesium Oxide -Mg Supplement 250 MG TABS Take 250 mg by mouth every morning.   Yes Historical Provider, MD  metoprolol succinate (TOPROL-XL) 100 MG 24 hr tablet Take 1/2 tablet daily 12/29/15  Yes Ivan Anchors Love, PA-C  moxifloxacin (AVELOX) 400 MG tablet Take 400 mg by mouth daily. For 10 days 01/26/16 02/04/16 Yes  Historical Provider, MD  Multiple Vitamins-Minerals (CENTRUM SILVER ULTRA MENS PO) Take 1 tablet by mouth daily.   Yes Historical Provider, MD  [facility] DELICA LANCETS 78G MISC 1 each by Other route 2 (two) times daily. Reported on 01/18/2016 09/03/15  Yes Historical Provider, MD  Glory Rosebush VERIO test strip 1 strip by Other route 2 (two) times daily. Reported on 01/18/2016 09/03/15  Yes  Historical Provider, MD  oxybutynin (DITROPAN-XL) 5 MG 24 hr tablet Take 5 mg by mouth daily.    Yes Historical Provider, MD  pantoprazole (PROTONIX) 40 MG tablet Take 40 mg by mouth daily.   Yes Historical Provider, MD  potassium chloride SA (KLOR-CON M20) 20 MEQ tablet Take 3 tablets daily. Patient taking differently: Take 60 mEq by mouth daily. Take 3 tablets daily. 12/29/15  Yes Pamela S Love, PA-C  PROAIR HFA 108 (90 BASE) MCG/ACT inhaler Inhale 2 puffs into the lungs every 6 (six) hours as needed for wheezing or shortness of breath.    Yes Historical Provider, MD  RESTASIS 0.05 % ophthalmic emulsion Place 1 drop into both eyes 2 (two) times daily. 10/01/13  Yes Historical Provider, MD  Spacer/Aero-Holding Chambers (AEROCHAMBER PLUS FLO-VU) [place]  10/10/14  Yes Historical Provider, MD  STIOLTO RESPIMAT 2.5-2.5 MCG/ACT AERS USE 2 INHALATIONS ORALLY   DAILY 12/27/15  Yes Brand Males, MD  vitamin C (ASCORBIC ACID) 500 MG tablet Take 500 mg by mouth 2 (two) times daily.   Yes Historical Provider, MD  Vitamin D, Ergocalciferol, (DRISDOL) 50000 UNITS CAPS capsule Take 50,000 Units by mouth every 14 (fourteen) days. Every other week on Sunday 11/08/13  Yes Historical Provider, MD  vitamin E 400 UNIT capsule Take 400 Units by mouth daily.   Yes Historical Provider, MD  ZETIA 10 MG tablet Take 10 mg by mouth daily. 10/30/15  Yes Historical Provider, MD  clopidogrel (PLAVIX) 75 MG tablet TAKE 1 TABLET DAILY Patient not taking: Reported on 02/03/2016 01/25/16   Troy Sine, MD  insulin aspart (NOVOLOG) 100 UNIT/ML injection  Correction coverage: Moderate (average weight, post-op)    CBG < 70: implement hypoglycemia protocol    CBG 70 - 120: 0 units    CBG 121 - 150: 2 units    CBG 151 - 200: 3 units    CBG 201 - 250: 5 units    CBG 251 - 300: 8 units    CBG 301 - 350: 11 units    CBG 351 - 400: 15 units    CBG > 400 call MD and obtain STAT lab verification Patient not taking:  Reported on 02/03/2016 01/16/16   Reyne Dumas, MD  losartan-hydrochlorothiazide (HYZAAR) 100-25 MG tablet TAKE 1 TABLET DAILY Patient not taking: Reported on 02/03/2016 01/25/16   Troy Sine, MD  pioglitazone (ACTOS) 45 MG tablet Take 45 mg by mouth daily. 12/24/15   Historical Provider, MD    Allergies:  Allergies  Allergen Reactions  . Mold Extract [Trichophyton] Anaphylaxis  . Lipitor [Atorvastatin] Other (See Comments)    Weakness in legs/myalgias    Past Medical History: Past Medical History  Diagnosis Date  . Hypertension   . High cholesterol   . Prostate cancer ([place])   . CAD (coronary artery disease)   . OSA (obstructive sleep apnea)   . COPD (chronic obstructive pulmonary disease) (HCC)     MODERATE  . Diabetes mellitus ([place])   . GERD (gastroesophageal reflux disease)   . Vertigo   . Arthritis  OA  . Anemia   . CHF (congestive heart failure) (HCC)     grade 2 diastolic dysfuction  . CKD (chronic kidney disease)   . Stroke (cerebrum) ([place])     11/2015  . HCAP (healthcare-associated pneumonia)     12/2015    Past Surgical History  Procedure Laterality Date  . Coronary artery bypass graft  03/29/1999    x 4:  LIMA to the LAD,SVG to the diagonal branch of the LAD,SVG to the intermediate coronary artery & SVG to the posterior descending branch of the RCA.  . Colon surgery      x 4  . Nm myocar perf wall motion  04/28/2012    Low Risk Scan  . Prostate surgery    . Cardiac catheterization    . Cardiac catheterization N/A 12/12/2015    Procedure: Right/Left Heart Cath and Coronary/Graft Angiography;  Surgeon: Troy Sine, MD;  Location: [place] [facility];  Service: Cardiovascular;  Laterality: N/A;  . Peripheral vascular catheterization N/A 12/12/2015    Procedure: Aortic Arch Angiography;  Surgeon: Troy Sine, MD;  Location: [place] [facility];  Service: Cardiovascular;  Laterality: N/A;  . Eye surgery    . Tee without cardioversion N/A 01/12/2016     Procedure: TRANSESOPHAGEAL ECHOCARDIOGRAM (TEE);  Surgeon: Sanda Klein, MD;  Location: [facility];  Service: Cardiovascular;  Laterality: N/A;    Social History: Patient lives in a skilled nursing facility.  Social History   Social History  . Marital Status: Widowed    Spouse Name: N/A  . Number of Children: N/A  . Years of Education: N/A   Occupational History  . retired    Social History Main Topics  . Smoking status: Former Smoker -- 1.00 packs/day for 30 years    Types: Cigarettes    Quit date: 12/24/1983  . Smokeless tobacco: Never Used     Comment: quit smoking in 1985  . Alcohol Use: 0.0 oz/week    0 Standard drinks or equivalent per week     Comment: rarely  . Drug Use: No  . Sexual Activity: Not on file   Other Topics Concern  . Not on file   Social History Narrative     Family History:  Family History  Problem Relation Age of Onset  . Kidney disease Brother   . Breast cancer Sister   . Rheum arthritis Mother      Review of Systems - unable due to his respiratory difficulties  Physical Examination  Filed Vitals:   02/03/16 1307 02/03/16 1408 02/03/16 1430 02/03/16 1501  BP:  118/54 114/101 133/57  Pulse:  102 99 99  Temp: 103.2 F (39.6 C)     TempSrc: Rectal     Resp:  33 17 19  SpO2:  97% 96% 95%    BP 133/57 mmHg  Pulse 99  Temp(Src) 103.2 F (39.6 C) (Rectal)  Resp 19  SpO2 95%  General appearance: alert, cooperative, appears stated age and no distress Head: Normocephalic, without obvious abnormality, atraumatic Eyes: conjunctivae/corneas clear. PERRL, EOM's intact.  Throat: Dry mucous membranes Neck: no adenopathy, no carotid bruit, no JVD, supple, symmetrical, trachea midline and thyroid not enlarged, symmetric, no tenderness/mass/nodules Resp: Coarse breath sounds on the right with crackles. Dullness to percussion at the base. No wheezing. Cardio: S1, S2 is normal, regular. Systolic murmur appreciated over the precordium. No  S3, S4. He laid edema is noted. Bilateral lower extremities GI: soft, non-tender; bowel sounds normal; no masses,  no organomegaly Extremities: Pedal edema noted bilateral lower extremities Pulses: Poorly palpable Skin: Skin color, texture, turgor normal. No rashes or lesions Lymph nodes: Cervical, supraclavicular, and axillary nodes normal. Neurologic: Right-sided weakness from previous stroke  Laboratory Data: Results for orders placed or performed during the hospital encounter of 02/03/16 (from the past 48 hour(s))  Comprehensive metabolic panel     Status: Abnormal   Collection Time: 02/03/16  1:10 PM  Result Value Ref Range   Sodium 135 135 - 145 mmol/L   Potassium 5.2 (H) 3.5 - 5.1 mmol/L   Chloride 103 101 - 111 mmol/L   CO2 19 (L) 22 - 32 mmol/L   Glucose, Bld 190 (H) 65 - 99 mg/dL   BUN 21 (H) 6 - 20 mg/dL   Creatinine, Ser 1.54 (H) 0.61 - 1.24 mg/dL   Calcium 8.6 (L) 8.9 - 10.3 mg/dL   Total Protein 8.1 6.5 - 8.1 g/dL   Albumin 2.9 (L) 3.5 - 5.0 g/dL   AST 46 (H) 15 - 41 U/L   ALT 39 17 - 63 U/L   Alkaline Phosphatase 138 (H) 38 - 126 U/L   Total Bilirubin 1.2 0.3 - 1.2 mg/dL   GFR calc non Af Amer 39 (L) >60 mL/min   GFR calc Af Amer 46 (L) >60 mL/min    Comment: (NOTE) The eGFR has been calculated using the CKD EPI equation. This calculation has not been validated in all clinical situations. eGFR's persistently <60 mL/min signify possible Chronic Kidney Disease.    Anion gap 13 5 - 15  CBC with Differential     Status: Abnormal   Collection Time: 02/03/16  1:10 PM  Result Value Ref Range   WBC 18.5 (H) 4.0 - 10.5 K/uL   RBC 3.27 (L) 4.22 - 5.81 MIL/uL   Hemoglobin 9.1 (L) 13.0 - 17.0 g/dL   HCT 29.2 (L) 39.0 - 52.0 %   MCV 89.3 78.0 - 100.0 fL   MCH 27.8 26.0 - 34.0 pg   MCHC 31.2 30.0 - 36.0 g/dL   RDW 22.1 (H) 11.5 - 15.5 %   Platelets 318 150 - 400 K/uL   Neutrophils Relative % 91 %   Lymphocytes Relative 4 %   Monocytes Relative 5 %   Eosinophils  Relative 0 %   Basophils Relative 0 %   Neutro Abs 16.9 (H) 1.7 - 7.7 K/uL   Lymphs Abs 0.7 0.7 - 4.0 K/uL   Monocytes Absolute 0.9 0.1 - 1.0 K/uL   Eosinophils Absolute 0.0 0.0 - 0.7 K/uL   Basophils Absolute 0.0 0.0 - 0.1 K/uL   RBC Morphology POLYCHROMASIA PRESENT   I-Stat CG4 Lactic Acid, ED     Status: Abnormal   Collection Time: 02/03/16  1:22 PM  Result Value Ref Range   Lactic Acid, Venous 3.04 (HH) 0.5 - 2.0 mmol/L   Comment NOTIFIED PHYSICIAN     Radiology Reports: Dg Chest 2 View  02/03/2016  CLINICAL DATA:  82 year old nursing home patient presenting with acute onset of cough, fever, shortness of breath and chest pain which began last night. EXAM: CHEST  2 VIEW COMPARISON:  01/09/2016 and earlier. FINDINGS: AP semi-erect and lateral images were obtained. Prior sternotomy for CABG. Cardiac silhouette markedly enlarged, unchanged. Thoracic aorta atherosclerotic and tortuous, unchanged. Hilar and mediastinal contours otherwise unremarkable. Dense airspace consolidation in the right upper lobe, right lower lobe and right middle lobe associated with a moderate-sized right pleural effusion. Minimal patchy airspace opacities in the left  lower lobe. Mild pulmonary venous hypertension without overt edema. IMPRESSION: Acute pneumonia involving the right upper lobe, right middle lobe, right lower lobe and to a lesser degree the left lower lobe. Moderate size right parapneumonic effusion. Electronically Signed   By: Evangeline Dakin M.D.   On: 02/03/2016 14:03     Problem List  Principal Problem:   HCAP (healthcare-associated pneumonia) Active Problems:   CAD (coronary artery disease) of artery bypass graft   Essential hypertension   DM2 (diabetes mellitus, type 2) (HCC)   Aortic valve stenosis   COPD, moderate (HCC)   Acute kidney failure (HCC)   Normocytic anemia   Sepsis ([place])   Acute respiratory failure with hypoxia (HCC)   Assessment: This is 82 year old African-American  male with multiple medical problems with multiple hospitalizations over the last few months, presents again with fever, cough, shortness of breath and is found to have pneumonia based on chest x-ray. This is most likely healthcare associated pneumonia. Aspiration pneumonia is another possibility.  Plan: #1 Healthcare associated pneumonia with acute respiratory failure with hypoxia and sepsis: Patient will be admitted to the stepdown unit. Sepsis protocol will be initiated. He'll be placed on vancomycin and cefepime. Aspiration precautions. Follow-up lactic acid levels. Blood cultures will be followed up on. Sputum cultures will be ordered, although the patient as of now has a dry cough. Oxygen will be provided. ABG will be checked. Old chest x-rays were also reviewed. The right-sided consolidation noted on current x-ray was also seen on previous x-ray. I think the patient may benefit from a CT scan considering persistence of these findings. Would like for his creatinine to improve so that we can use contrast. This will need to be considered tomorrow. If creatinine does not improve, will need to proceed with a noncontrast CT.  #2 acute on chronic renal failure stage II: Creatinine is slightly higher than his baseline. Potassium was also elevated. He will be hydrated based on the sepsis protocol. Repeat blood work in the morning. Monitor urine output.  #3 Recent staph aureus bacteremia: This was detected during his last hospitalization. He underwent quite a workup including TEE, which did not show any vegetation. He was discharged on intravenous vancomycin through peripheral IV. Patient declined PICC line at that time. Repeat cultures were negative prior to discharge. Follow up on blood cultures.  #4 recent history of stroke (December 2016): This occurred after his cardiac catheterization. He had right hemiparesis. He was in inpatient rehabilitation unit. Continue Plavix. For some reason Aspirin is not on his  medication list. This will need to be verified.  #5 history of essential hypertension: Monitor blood pressures closely. Hold some of his oral agents due to borderline low blood pressures. And sepsis.  #6 Diabetes mellitus type 2 on insulin with kidney complications: Monitor CBGs. Continue with sliding scale coverage. Lantus at a lower dose.  #7 history of coronary artery disease and aortic stenosis and diastolic dysfunction: Recent echocardiogram showed a EF of 50% with grade 2 diastolic dysfunction. He does have a loud murmur on examination. Caution with IV fluids, although he is septic. Monitor closely. Resume his Lasix in the next day or so.  #8 Pedal edema: He has edema in both lower extremities, which is worse than his usual per his daughter. We will check venous Doppler studies.  DVT Prophylaxis: Heparin subcutaneously Code Status: Discussed in detail with the patient in the presence of his daughter. He wishes to be DO NOT RESUSCITATE Family Communication: Discussed with the  patient and his daughter  Disposition Plan: Admit to stepdown   Further management decisions will depend on results of further testing and patient's response to treatment.   [facility]  [facility] Pager [phone]  If 7PM-7AM, please contact night-coverage [email or web address] Password [facility]  02/03/2016, 4:26 PM

## 2016-02-03 NOTE — ED Notes (Addendum)
Per EMS, pt from [facility].  Pt with cough/fever/shortness of breath since last night.  Fever 102.1.  Rt flank pain increased with palpation.  Pt is alert and oriented.  No breathing tx in route.  Vitals: 122/54, hr 102, 93% on 3L (home dosing), resp 20, cbg 228

## 2016-02-04 ENCOUNTER — Inpatient Hospital Stay (HOSPITAL_COMMUNITY): Payer: Medicare Other

## 2016-02-04 ENCOUNTER — Encounter (HOSPITAL_COMMUNITY): Payer: Self-pay | Admitting: Radiology

## 2016-02-04 DIAGNOSIS — R609 Edema, unspecified: Secondary | ICD-10-CM

## 2016-02-04 DIAGNOSIS — I82409 Acute embolism and thrombosis of unspecified deep veins of unspecified lower extremity: Secondary | ICD-10-CM | POA: Diagnosis present

## 2016-02-04 DIAGNOSIS — I2699 Other pulmonary embolism without acute cor pulmonale: Secondary | ICD-10-CM

## 2016-02-04 DIAGNOSIS — J9601 Acute respiratory failure with hypoxia: Secondary | ICD-10-CM

## 2016-02-04 DIAGNOSIS — I82403 Acute embolism and thrombosis of unspecified deep veins of lower extremity, bilateral: Secondary | ICD-10-CM

## 2016-02-04 DIAGNOSIS — J449 Chronic obstructive pulmonary disease, unspecified: Secondary | ICD-10-CM

## 2016-02-04 DIAGNOSIS — J90 Pleural effusion, not elsewhere classified: Secondary | ICD-10-CM | POA: Diagnosis present

## 2016-02-04 DIAGNOSIS — J948 Other specified pleural conditions: Secondary | ICD-10-CM

## 2016-02-04 LAB — HEPARIN LEVEL (UNFRACTIONATED): Heparin Unfractionated: 0.43 IU/mL (ref 0.30–0.70)

## 2016-02-04 LAB — LACTATE DEHYDROGENASE: LDH: 429 U/L — ABNORMAL HIGH (ref 98–192)

## 2016-02-04 LAB — COMPREHENSIVE METABOLIC PANEL
ALBUMIN: 2.3 g/dL — AB (ref 3.5–5.0)
ALK PHOS: 143 U/L — AB (ref 38–126)
ALT: 70 U/L — ABNORMAL HIGH (ref 17–63)
ANION GAP: 11 (ref 5–15)
AST: 129 U/L — AB (ref 15–41)
BUN: 24 mg/dL — ABNORMAL HIGH (ref 6–20)
CALCIUM: 7.8 mg/dL — AB (ref 8.9–10.3)
CO2: 17 mmol/L — AB (ref 22–32)
Chloride: 109 mmol/L (ref 101–111)
Creatinine, Ser: 1.4 mg/dL — ABNORMAL HIGH (ref 0.61–1.24)
GFR calc Af Amer: 51 mL/min — ABNORMAL LOW (ref >60)
GFR calc non Af Amer: 44 mL/min — ABNORMAL LOW (ref >60)
GLUCOSE: 229 mg/dL — AB (ref 65–99)
Potassium: 5 mmol/L (ref 3.5–5.1)
SODIUM: 137 mmol/L (ref 135–145)
Total Bilirubin: 0.9 mg/dL (ref 0.3–1.2)
Total Protein: 6.8 g/dL (ref 6.5–8.1)

## 2016-02-04 LAB — GLUCOSE, CAPILLARY
GLUCOSE-CAPILLARY: 171 mg/dL — AB (ref 65–99)
GLUCOSE-CAPILLARY: 181 mg/dL — AB (ref 65–99)
GLUCOSE-CAPILLARY: 244 mg/dL — AB (ref 65–99)
GLUCOSE-CAPILLARY: 343 mg/dL — AB (ref 65–99)
Glucose-Capillary: 213 mg/dL — ABNORMAL HIGH (ref 65–99)

## 2016-02-04 LAB — CBC
HCT: 25.9 % — ABNORMAL LOW (ref 39.0–52.0)
Hemoglobin: 8.1 g/dL — ABNORMAL LOW (ref 13.0–17.0)
MCH: 27.9 pg (ref 26.0–34.0)
MCHC: 31.3 g/dL (ref 30.0–36.0)
MCV: 89.3 fL (ref 78.0–100.0)
PLATELETS: 229 10*3/uL (ref 150–400)
RBC: 2.9 MIL/uL — ABNORMAL LOW (ref 4.22–5.81)
RDW: 21.9 % — AB (ref 11.5–15.5)
WBC: 13.1 10*3/uL — ABNORMAL HIGH (ref 4.0–10.5)

## 2016-02-04 LAB — TROPONIN I
Troponin I: 1.05 ng/mL (ref <0.031)
Troponin I: 1.04 ng/mL (ref <0.031)

## 2016-02-04 LAB — PROCALCITONIN: Procalcitonin: 5.57 ng/mL

## 2016-02-04 LAB — BRAIN NATRIURETIC PEPTIDE: B Natriuretic Peptide: 1553.6 pg/mL — ABNORMAL HIGH (ref 0.0–100.0)

## 2016-02-04 MED ORDER — FLUTICASONE PROPIONATE 50 MCG/ACT NA SUSP
2.0000 | Freq: Every day | NASAL | Status: DC
  Administered 2016-02-04 – 2016-02-13 (×9): 2 via NASAL
  Filled 2016-02-04: qty 16

## 2016-02-04 MED ORDER — FUROSEMIDE 10 MG/ML IJ SOLN: 20.0000 mg | Freq: Two times a day (BID) | INTRAMUSCULAR | Status: DC

## 2016-02-04 MED ORDER — ENSURE ENLIVE PO LIQD
237.0000 mL | Freq: Two times a day (BID) | ORAL | Status: DC
  Administered 2016-02-04 – 2016-02-05 (×3): 237 mL via ORAL

## 2016-02-04 MED ORDER — HEPARIN (PORCINE) IN NACL 100-0.45 UNIT/ML-% IJ SOLN
1300.0000 [IU]/h | INTRAMUSCULAR | Status: DC
  Administered 2016-02-04 – 2016-02-05 (×2): 1300 [IU]/h via INTRAVENOUS
  Filled 2016-02-04 (×2): qty 250

## 2016-02-04 MED ORDER — DEXTROSE 5 % IV SOLN
500.0000 mg | Freq: Every day | INTRAVENOUS | Status: DC
  Administered 2016-02-04 – 2016-02-09 (×6): 500 mg via INTRAVENOUS
  Filled 2016-02-04 (×6): qty 500

## 2016-02-04 MED ORDER — BUDESONIDE 0.25 MG/2ML IN SUSP
0.2500 mg | Freq: Two times a day (BID) | RESPIRATORY_TRACT | Status: DC
  Administered 2016-02-04 – 2016-02-13 (×19): 0.25 mg via RESPIRATORY_TRACT
  Filled 2016-02-04 (×20): qty 2

## 2016-02-04 MED ORDER — INSULIN ASPART 100 UNIT/ML ~~LOC~~ SOLN
0.0000 [IU] | Freq: Three times a day (TID) | SUBCUTANEOUS | Status: DC
  Administered 2016-02-04: 5 [IU] via SUBCUTANEOUS
  Administered 2016-02-04: 3 [IU] via SUBCUTANEOUS
  Administered 2016-02-04 – 2016-02-05 (×2): 5 [IU] via SUBCUTANEOUS
  Administered 2016-02-05: 3 [IU] via SUBCUTANEOUS
  Administered 2016-02-05: 8 [IU] via SUBCUTANEOUS
  Administered 2016-02-06: 2 [IU] via SUBCUTANEOUS
  Administered 2016-02-06: 5 [IU] via SUBCUTANEOUS
  Administered 2016-02-06 – 2016-02-07 (×3): 3 [IU] via SUBCUTANEOUS
  Administered 2016-02-07: 2 [IU] via SUBCUTANEOUS
  Administered 2016-02-08: 5 [IU] via SUBCUTANEOUS
  Administered 2016-02-08 – 2016-02-09 (×3): 2 [IU] via SUBCUTANEOUS
  Administered 2016-02-09: 5 [IU] via SUBCUTANEOUS
  Administered 2016-02-10 (×2): 3 [IU] via SUBCUTANEOUS
  Administered 2016-02-11 (×2): 5 [IU] via SUBCUTANEOUS
  Administered 2016-02-11: 11 [IU] via SUBCUTANEOUS
  Administered 2016-02-12: 8 [IU] via SUBCUTANEOUS
  Administered 2016-02-12: 11 [IU] via SUBCUTANEOUS
  Administered 2016-02-12: 8 [IU] via SUBCUTANEOUS
  Administered 2016-02-13: 15 [IU] via SUBCUTANEOUS
  Administered 2016-02-13: 8 [IU] via SUBCUTANEOUS

## 2016-02-04 MED ORDER — PANTOPRAZOLE SODIUM 40 MG PO TBEC
40.0000 mg | DELAYED_RELEASE_TABLET | Freq: Every day | ORAL | Status: DC
  Administered 2016-02-04 – 2016-02-09 (×6): 40 mg via ORAL
  Filled 2016-02-04 (×9): qty 1

## 2016-02-04 MED ORDER — IOHEXOL 350 MG/ML SOLN
100.0000 mL | Freq: Once | INTRAVENOUS | Status: AC | PRN
  Administered 2016-02-04: 100 mL via INTRAVENOUS

## 2016-02-04 MED ORDER — GUAIFENESIN ER 600 MG PO TB12
1200.0000 mg | ORAL_TABLET | Freq: Two times a day (BID) | ORAL | Status: DC
  Administered 2016-02-04 – 2016-02-13 (×18): 1200 mg via ORAL
  Filled 2016-02-04 (×21): qty 2

## 2016-02-04 MED ORDER — ARFORMOTEROL TARTRATE 15 MCG/2ML IN NEBU
15.0000 ug | INHALATION_SOLUTION | Freq: Two times a day (BID) | RESPIRATORY_TRACT | Status: DC
  Administered 2016-02-04 – 2016-02-13 (×19): 15 ug via RESPIRATORY_TRACT
  Filled 2016-02-04 (×21): qty 2

## 2016-02-04 MED ORDER — LEVALBUTEROL HCL 0.63 MG/3ML IN NEBU
0.6300 mg | INHALATION_SOLUTION | Freq: Four times a day (QID) | RESPIRATORY_TRACT | Status: DC
  Administered 2016-02-04 – 2016-02-06 (×8): 0.63 mg via RESPIRATORY_TRACT
  Filled 2016-02-04 (×8): qty 3

## 2016-02-04 MED ORDER — IPRATROPIUM BROMIDE 0.02 % IN SOLN
0.5000 mg | Freq: Four times a day (QID) | RESPIRATORY_TRACT | Status: DC
  Administered 2016-02-04 – 2016-02-06 (×8): 0.5 mg via RESPIRATORY_TRACT
  Filled 2016-02-04 (×9): qty 2.5

## 2016-02-04 MED ORDER — LEVALBUTEROL HCL 0.63 MG/3ML IN NEBU: 0.6300 mg | INHALATION_SOLUTION | RESPIRATORY_TRACT | Status: DC | PRN

## 2016-02-04 MED ORDER — HYDROCODONE-HOMATROPINE 5-1.5 MG/5ML PO SYRP
5.0000 mL | ORAL_SOLUTION | Freq: Four times a day (QID) | ORAL | Status: DC | PRN
  Administered 2016-02-04 – 2016-02-08 (×8): 5 mL via ORAL
  Filled 2016-02-04 (×9): qty 5

## 2016-02-04 MED ORDER — HEPARIN BOLUS VIA INFUSION
2000.0000 [IU] | Freq: Once | INTRAVENOUS | Status: AC
  Administered 2016-02-04: 2000 [IU] via INTRAVENOUS
  Filled 2016-02-04: qty 2000

## 2016-02-04 MED ORDER — FUROSEMIDE 10 MG/ML IJ SOLN
40.0000 mg | Freq: Two times a day (BID) | INTRAMUSCULAR | Status: DC
Start: 2016-02-04 — End: 2016-02-05
  Administered 2016-02-04 – 2016-02-05 (×2): 40 mg via INTRAVENOUS
  Filled 2016-02-04 (×2): qty 4

## 2016-02-04 MED ORDER — LORATADINE 10 MG PO TABS
10.0000 mg | ORAL_TABLET | Freq: Every day | ORAL | Status: DC
  Administered 2016-02-04 – 2016-02-13 (×9): 10 mg via ORAL
  Filled 2016-02-04 (×10): qty 1

## 2016-02-04 MED ORDER — IPRATROPIUM BROMIDE 0.02 % IN SOLN: 0.5000 mg | RESPIRATORY_TRACT | Status: DC | PRN

## 2016-02-04 NOTE — Progress Notes (Deleted)
Patient in asystole on monitor no respirations noted.  Family at beside pronounced by Probation officer and Jacklynn Lewis, RN. MD notified

## 2016-02-04 NOTE — Progress Notes (Addendum)
Attempted bipap.  Pt wore bipap x 20 minutes.  Pt requested to take bipap off because he does not like the mask.  I encouraged pt to try to wear bipap for a couple of hours, pt initially agreed however once I walked out of the room, pt removed bipap himself.  Pt states he does not want to wear bipap now because he "feels trapped" with the mask on.  RN aware.    Dr Grandville Silos notified of above.

## 2016-02-04 NOTE — Progress Notes (Addendum)
[facility] PROGRESS NOTE  Douglas Carrillo [id number] DOB: 26-Feb-1928 DOA: 02/03/2016 PCP: Limmie Patricia, MD  Assessment/Plan: #1 acute on chronic respiratory failure with hypoxia Secondary to multifocal pneumonia noted on chest x-ray and right probable parapneumonic effusion. Patient with some use of accessory muscles of respiration. Patient coughing. Patient currently afebrile. Leukocytosis trending down. Sputum Gram stain and culture pending. Blood cultures pending. Urine cultures pending. Check a urine Legionella antigen. Check a urine pneumococcus antigen. Continue empiric IV vancomycin and IV cefepime. Add IV azithromycin. Add Mucinex. Discontinue duo nebs and place on Atrovent and Xopenex nebulizers as patient is tachycardic. Will also place on the Pulmicort and Brovana, Claritin, Flonase. If worsening may consider BiPAP as patient is a DO NOT RESUSCITATE. Will consult with critical care medicine for further evaluation and management.  #2 recurrent healthcare associated pneumonia Patient recently discharged on 01/16/2016 with a healthcare associated pneumonia and a stab bacteremia presenting back with multilobar pneumonia. Sputum Gram stain and cultures pending. Blood cultures pending. Check a urine Legionella antigen. Check a urine pneumococcus antigen. Patient had an influenza PCR done during prior hospitalization and a such will not repeated. Patient hours 3 panel was negative during last hospitalization and a such will not repeat. Continue empiric IV vancomycin and IV cefepime. Will add IV azithromycin. Add Mucinex, nebs. Check CT chest. Follow.  #3 right pleural effusion/probable parapneumonic effusion Likely secondary to problem #2. Patient has been pancultured. Check LDH. Continue empiric Vancomycin, IV cefepime. Add IV azithromycin. Consult with pulmonary/critical care medicine for further evaluation and management.?? Thoracenthesis. Spoke with IR who recommended CT chest  first to further evaluate effusion.  #4 COPD Patient on chronic home O2. Discontinue duo nebs. Place on Xopenex and Atrovent nebulizers. Placed on Pulmicort and Brovana. Follow.  #5 diabetes mellitus type 2 Hemoglobin A1c was 6.8 on 12/14/2015. Continue home dose Lantus. Place on sliding scale insulin.  #6 acute on chronic kidney disease stage II In the setting of ARB/HCTZ. ARB HCTZ on hold. Slowly improving. Decrease IV fluids to 75 mL per hour. Follow.  #7 recent staph aureus bacteremia Noted during last hospitalization. Patient underwent a workup including TEE which was negative for vegetations. Patient was discharged on IV vancomycin through peripheral IV and completed course of antibiotic treatment. Patient declined a PICC line at that time. Repeat cultures were negative prior to discharge. Blood cultures pending at this time.  #8 history of recent stroke December 2016 AFTER cardiac catheterization. Patient with right hemiparesis. Patient was in inpatient rehabilitation unit. Continue Plavix for secondary stroke prevention.  #9 hypertension Toprol.  #10 history of coronary artery disease/aortic stenosis/diastolic dysfunction 2-D echo with EF of 50% with grade 2 diastolic dysfunction. Patient with murmur noted on exam. Decrease IV fluids to 75 mL per hour. Monitor closely. Consider resuming home dose Lasix in the next 1-2 days.  #11 lower extremity edema Venous Dopplers pending. May need some diuretics however will hold off for now. Follow.  #12 prophylaxis PPI for GI prophylaxis. Heparin for DVT prophylaxis.  Code Status: DO NOT RESUSCITATE Family Communication: Updated patient. No family present. Disposition Plan: Remain in the step down unit.   Consultants:  None  Procedures:  Chest x-ray 02/03/2016  Antibiotics:  IV vancomycin 02/03/2016  IV cefepime 02/03/2016  HPI/Subjective: Patient with cough in the room however speaking in full sentences. Patient denies  any chest pain. Patient endorses shortness of breath.  Objective: Filed Vitals:   02/04/16 0700 02/04/16 0800  BP: 136/63   Pulse: 93  Temp:  98.6 F (37 C)  Resp:      Intake/Output Summary (Last 24 hours) at 02/04/16 0920 Last data filed at 02/04/16 0330  Gross per 24 hour  Intake     50 ml  Output    600 ml  Net   -550 ml   Filed Weights   02/04/16 0400  Weight: 82.7 kg (182 lb 5.1 oz)    Exam:   General:  Some use of accessory muscles of respiration. However speaking in full sentences.  Cardiovascular: Tachycardic with 3/6 systolic ejection murmur  Respiratory: Some coarse breath sounds right greater than left. No crackles.  Abdomen: Soft, nontender, nondistended, positive bowel sounds  Musculoskeletal: No clubbing no cyanosis 2+ bilateral lower extremity edema.  Data Reviewed: Basic Metabolic Panel:  Recent Labs Lab 02/03/16 1310 02/04/16 0416  NA 135 137  K 5.2* 5.0  CL 103 109  CO2 19* 17*  GLUCOSE 190* 229*  BUN 21* 24*  CREATININE 1.54* 1.40*  CALCIUM 8.6* 7.8*   Liver Function Tests:  Recent Labs Lab 02/03/16 1310 02/04/16 0416  AST 46* 129*  ALT 39 70*  ALKPHOS 138* 143*  BILITOT 1.2 0.9  PROT 8.1 6.8  ALBUMIN 2.9* 2.3*   No results for input(s): LIPASE, AMYLASE in the last 168 hours. No results for input(s): AMMONIA in the last 168 hours. CBC:  Recent Labs Lab 02/03/16 1310 02/04/16 0416  WBC 18.5* 13.1*  NEUTROABS 16.9*  --   HGB 9.1* 8.1*  HCT 29.2* 25.9*  MCV 89.3 89.3  PLT 318 229   Cardiac Enzymes: No results for input(s): CKTOTAL, CKMB, CKMBINDEX, TROPONINI in the last 168 hours. BNP (last 3 results)  Recent Labs  01/09/16 0704  BNP 338.3*    ProBNP (last 3 results) No results for input(s): PROBNP in the last 8760 hours.  CBG:  Recent Labs Lab 02/03/16 2157 02/04/16 0752  GLUCAP 250* 213*    Recent Results (from the past 240 hour(s))  MRSA PCR Screening     Status: None   Collection Time:  02/03/16  6:53 PM  Result Value Ref Range Status   MRSA by PCR NEGATIVE NEGATIVE Final    Comment:        The GeneXpert MRSA Assay (FDA approved for NASAL specimens only), is one component of a comprehensive MRSA colonization surveillance program. It is not intended to diagnose MRSA infection nor to guide or monitor treatment for MRSA infections.      Studies: Dg Chest 2 View  02/03/2016  CLINICAL DATA:  82 year old nursing home patient presenting with acute onset of cough, fever, shortness of breath and chest pain which began last night. EXAM: CHEST  2 VIEW COMPARISON:  01/09/2016 and earlier. FINDINGS: AP semi-erect and lateral images were obtained. Prior sternotomy for CABG. Cardiac silhouette markedly enlarged, unchanged. Thoracic aorta atherosclerotic and tortuous, unchanged. Hilar and mediastinal contours otherwise unremarkable. Dense airspace consolidation in the right upper lobe, right lower lobe and right middle lobe associated with a moderate-sized right pleural effusion. Minimal patchy airspace opacities in the left lower lobe. Mild pulmonary venous hypertension without overt edema. IMPRESSION: Acute pneumonia involving the right upper lobe, right middle lobe, right lower lobe and to a lesser degree the left lower lobe. Moderate size right parapneumonic effusion. Electronically Signed   By: Evangeline Dakin M.D.   On: 02/03/2016 14:03    Scheduled Meds: . arformoterol  15 mcg Nebulization BID  . budesonide (PULMICORT) nebulizer solution  0.25 mg Nebulization BID  .  ceFEPime (MAXIPIME) IV  2 g Intravenous Q24H  . clopidogrel  75 mg Oral Daily  . docusate sodium  100 mg Oral BID  . ferrous sulfate  325 mg Oral BID WC  . fluticasone  2 spray Each Nare Daily  . guaiFENesin  1,200 mg Oral BID  . heparin  5,000 Units Subcutaneous 3 times per day  . insulin aspart  0-15 Units Subcutaneous TID WC  . insulin glargine  25 Units Subcutaneous Daily  . ipratropium  0.5 mg Nebulization  Q6H  . latanoprost  1 drop Both Eyes QHS  . levalbuterol  0.63 mg Nebulization Q6H  . loratadine  10 mg Oral Daily  . metoprolol succinate  25 mg Oral Daily  . oxybutynin  5 mg Oral Daily  . pantoprazole  40 mg Oral Q0600  . rosuvastatin  20 mg Oral Daily  . sodium chloride flush  3 mL Intravenous Q12H  . vancomycin  1,250 mg Intravenous Q24H   Continuous Infusions: . sodium chloride 100 mL/hr at 02/04/16 [id number]    Principal Problem:   Acute respiratory failure with hypoxia (HCC) Active Problems:   HCAP (healthcare-associated pneumonia)   Pleural effusion on right   CAD (coronary artery disease) of artery bypass graft   Hyperlipidemia with target LDL less than 70   Essential hypertension   DM2 (diabetes mellitus, type 2) (HCC)   Aortic valve stenosis   COPD, moderate (HCC)   Acute kidney failure (HCC)   Normocytic anemia   GERD (gastroesophageal reflux disease)   Sepsis ([place])   Stroke (cerebrum) ([place])    Time spent: 40 mins    [facility] MD [facility] Pager [phone]. If 7PM-7AM, please contact night-coverage at [email or web address], password [facility] 02/04/2016, 9:20 AM  LOS: 1 day    Addendum 02/04/2016 1000am LE dopplers positive for DVT on right. Patient with SOB. Check CT angio chest and place on IV heparin.Marland Kitchen

## 2016-02-04 NOTE — Progress Notes (Signed)
ANTICOAGULATION CONSULT NOTE - Initial Consult  Pharmacy Consult for IV heparin Indication: VTE Treatment  Allergies  Allergen Reactions  . Mold Extract [Trichophyton] Anaphylaxis  . Lipitor [Atorvastatin] Other (See Comments)    Weakness in legs/myalgias    Patient Measurements: Weight: 182 lb 5.1 oz (82.7 kg) Heparin Dosing Weight: 82.7kg  Vital Signs: Temp: 98.6 F (37 C) (02/12 0800) Temp Source: Oral (02/12 0800) BP: 136/63 mmHg (02/12 0700) Pulse Rate: 93 (02/12 0700)  Labs:  Recent Labs  02/03/16 1310 02/03/16 1923 02/04/16 0416  HGB 9.1*  --  8.1*  HCT 29.2*  --  25.9*  PLT 318  --  229  APTT  --  33  --   LABPROT  --  18.8*  --   INR  --  1.57*  --   CREATININE 1.54*  --  1.40*    Estimated Creatinine Clearance: 42.3 mL/min (by C-G formula based on Cr of 1.4).   Medical History: Past Medical History  Diagnosis Date  . Hypertension   . High cholesterol   . Prostate cancer ([place])   . CAD (coronary artery disease)   . OSA (obstructive sleep apnea)   . COPD (chronic obstructive pulmonary disease) (HCC)     MODERATE  . Diabetes mellitus ([place])   . GERD (gastroesophageal reflux disease)   . Vertigo   . Arthritis     OA  . Anemia   . CHF (congestive heart failure) (HCC)     grade 2 diastolic dysfuction  . CKD (chronic kidney disease)   . Stroke (cerebrum) ([place])     11/2015  . HCAP (healthcare-associated pneumonia)     12/2015    Assessment: 62 yoM with COPD on home 02, CAD, CKD, HTN, severe aortic stenosis and recent hospitalization for staph bacteremia and HCAP presents 2/11 with SOB and cough found to have recurrent right-sided PNA and probably parapneumonic effusion with elevated LA and tachycardia, started on antibiotics.  Dopplers now positive for DVT in peroneal veins, left distal popliteal vein, left soleal veins, and left peroneal veins.  Pharmacy consulted to start IV heparin for VTE treatment.   2/12: PTA on plavix (?not aspirin) though  pt reports not taking medication.  Resumed inpatient.  Heparin SQ TID currently ordered, LD [id number] on 2/12 Renal function: SCr elevated, sl improvement overnight.  CrCl ~42 ml/min CBC: Hgb low, decreased to 8.1.  Plts WNL.  Baseline aPTT WNL.  Baseline PT/INR sl elevated.  No bleeding per RN.     Goal of Therapy:  Heparin level 0.3-0.7 units/ml Monitor platelets by anticoagulation protocol: Yes   Plan:  D/C SQ heparin Start IV heparin 2000 unit bolus x 1 (lower due to age, anemia, PT/INR), then infusion 1300 units/hr (=13 ml/hr).   F/u 8 hour heparin level CBC at least q72h F/u renal fxn, signs and symptoms of bleeding  Ralene Bathe, PharmD, BCPS 02/04/2016, 10:08 AM  Pager: [phone]

## 2016-02-04 NOTE — Progress Notes (Signed)
ANTICOAGULATION CONSULT NOTE - Follow Up Consult  Pharmacy Consult for Heparin Indication: VTE treatment  Allergies  Allergen Reactions  . Mold Extract [Trichophyton] Anaphylaxis  . Lipitor [Atorvastatin] Other (See Comments)    Weakness in legs/myalgias    Patient Measurements: Weight: 182 lb 5.1 oz (82.7 kg) Heparin Dosing Weight:   Vital Signs: Temp: 97.3 F (36.3 C) (02/12 1600) Temp Source: Oral (02/12 1600) BP: 136/59 mmHg (02/12 1800) Pulse Rate: 96 (02/12 1800)  Labs:  Recent Labs  02/03/16 1310 02/03/16 1923 02/04/16 0416 02/04/16 1435 02/04/16 1931  HGB 9.1*  --  8.1*  --   --   HCT 29.2*  --  25.9*  --   --   PLT 318  --  229  --   --   APTT  --  33  --   --   --   LABPROT  --  18.8*  --   --   --   INR  --  1.57*  --   --   --   HEPARINUNFRC  --   --   --   --  0.43  CREATININE 1.54*  --  1.40*  --   --   TROPONINI  --   --   --  1.05*  --     Estimated Creatinine Clearance: 42.3 mL/min (by C-G formula based on Cr of 1.4).   Medications:  Infusions:  . heparin 1,300 Units/hr (02/04/16 1040)    Assessment: Patient with heparin level at goal.  No heparin issues noted.  Goal of Therapy:  Heparin level 0.3-0.7 units/ml Monitor platelets by anticoagulation protocol: Yes   Plan:  Continue heparin drip at current rate Recheck level with AM labs  Nani Skillern Crowford 02/04/2016,9:20 PM

## 2016-02-04 NOTE — Progress Notes (Signed)
*  PRELIMINARY RESULTS* Vascular Ultrasound Lower extremity venous duplex has been completed.  Preliminary findings: DVT noted in the right peroneal veins, left distal popliteal vein, left soleal veins, and left peroneal veins.  Landry Mellow, RDMS, RVT  02/04/2016, 9:41 AM

## 2016-02-04 NOTE — Progress Notes (Signed)
Placed pt on 10 lpm [place], pt tol well so far and states that it seems like it "helps".  Sat 100, RR 26-30.

## 2016-02-04 NOTE — Consult Note (Signed)
Name: Douglas Carrillo MRN: [id number] DOB: October 02, 1928    ADMISSION DATE:  02/03/2016 CONSULTATION DATE:  02/04/2016  REFERRING MD :  Irine Seal, M.D. / Hospitalist  CHIEF COMPLAINT:  Multi-Focal Pneumonia w/ Pleural Effusion  BRIEF PATIENT DESCRIPTION: 82 year old male with known history of COPD and OSA presenting reporting that he was treated for pneumonia in January. Patient reports he initially felt well but then had recurrence of symptoms starting approximately 2 weeks ago.  SIGNIFICANT EVENTS  2/12 - Admit  STUDIES:  CXR PA/LAT 2/11:  Worsening of right lung opacities compared with CXR from January 2017. Right pleural effusion appreciated. Some opacity in his left lower lobe.  Venous Duplex 2/12:  Preliminary finding of DVT in R peroneal veins as well as L distal popliteal, soleal, & peroneal veins. CTA Chest 2/12:  Bilateral PE w/ RV/LV ratio 1.1. Scattered lung consolidation could represent infarct or infection. Moderate right pleural effusion. Emphysema noted. No pneumothorax. No pericardial effusion.  MICROBIOLOGY: Blood Ctx x2 2/11>>> MRSA PCR 2/11>>> Influenza PCR 1/17:  Negative Respiratory Viral Panel PCR 1/17:  Negative  ANTIBIOTICS: Cefepime 2/11>>> Vancomycin 2/11>>>  LINES/TUBES: PIV x2  HISTORY OF PRESENT ILLNESS:  Patient reports he was treated for pneumonia in January with a course of antibiotics. He reports he subsequently felt better after treatment. Approximately 2 weeks ago he began to experience sinus congestion with worsening dyspnea above his baseline. He reports his dyspnea truly began last summer when he noted shortness of breath while attempting to mow his lawn. He denies any sore throat. He denies any recent sick contacts. He denies any subjective fever, chills, or sweats. He denies any chest pain or pressure. He denies any myalgias. He denies any dysphagia or odynophagia. He denies any headache. He denies any focal weakness, numbness, or tingling.  He denies any melena, hematochezia, or hematuria. Patient was found have a right-sided pleural effusion and given his multifocal opacities on x-ray imaging hospitalist was concerned about the potential for healthcare associated pneumonia. The patient's venous duplex and subsequent CT angiogram of the chest confirmed not only DVTs but also pulmonary emboli with possible/probable associated lung infarction and a right pleural effusion.  PAST MEDICAL HISTORY :  Past Medical History  Diagnosis Date  . Hypertension   . High cholesterol   . Prostate cancer ([place])   . CAD (coronary artery disease)   . OSA (obstructive sleep apnea)     Not currently on CPAP/uses 2 L/m while sleeping  . COPD (chronic obstructive pulmonary disease) (HCC)     MODERATE  . Diabetes mellitus ([place])   . GERD (gastroesophageal reflux disease)   . Vertigo   . Arthritis     OA  . Anemia   . CHF (congestive heart failure) (HCC)     grade 2 diastolic dysfuction  . CKD (chronic kidney disease)   . Stroke (cerebrum) ([place])     11/2015  . HCAP (healthcare-associated pneumonia)     12/2015    PAST SURGICAL HISTORY: Past Surgical History  Procedure Laterality Date  . Coronary artery bypass graft  03/29/1999    x 4:  LIMA to the LAD,SVG to the diagonal branch of the LAD,SVG to the intermediate coronary artery & SVG to the posterior descending branch of the RCA.  . Colon surgery      x 4  . Nm myocar perf wall motion  04/28/2012    Low Risk Scan  . Prostate surgery    . Cardiac catheterization    .  Cardiac catheterization N/A 12/12/2015    Procedure: Right/Left Heart Cath and Coronary/Graft Angiography;  Surgeon: Troy Sine, MD;  Location: [facility];  Service: Cardiovascular;  Laterality: N/A;  . Peripheral vascular catheterization N/A 12/12/2015    Procedure: Aortic Arch Angiography;  Surgeon: Troy Sine, MD;  Location: [facility];  Service: Cardiovascular;  Laterality: N/A;  . Eye surgery    . Tee  without cardioversion N/A 01/12/2016    Procedure: TRANSESOPHAGEAL ECHOCARDIOGRAM (TEE);  Surgeon: Sanda Klein, MD;  Location: [facility] ENDOSCOPY;  Service: Cardiovascular;  Laterality: N/A;    Prior to Admission medications   Medication Sig Start Date End Date Taking? Authorizing Provider  acetaminophen (TYLENOL) 325 MG tablet Take 650 mg by mouth every 4 (four) hours as needed for mild pain or headache. 02/03/16 02/05/16 Yes Historical Provider, MD  Amino Acids-Protein Hydrolys (FEEDING SUPPLEMENT, PRO-STAT SUGAR FREE 64,) LIQD Take 30 mLs by mouth 2 (two) times daily.   Yes Historical Provider, MD  b complex vitamins capsule Take 1 capsule by mouth daily.   Yes Historical Provider, MD  B-D ULTRAFINE III SHORT PEN 31G X 8 MM MISC Reported on 01/18/2016 10/19/14  Yes Historical Provider, MD  benzonatate (TESSALON) 100 MG capsule Take 100 mg by mouth 3 (three) times daily. 11/24/15  Yes Historical Provider, MD  CRESTOR 20 MG tablet Take 20 mg by mouth daily.  10/01/13  Yes Historical Provider, MD  ferrous sulfate 325 (65 FE) MG tablet Take 325 mg by mouth 2 (two) times daily with a meal.   Yes Historical Provider, MD  furosemide (LASIX) 40 MG tablet TAKE 1 TABLET DAILY 11/27/15  Yes Troy Sine, MD  HYDROcodone-acetaminophen (NORCO/VICODIN) 5-325 MG tablet Take 1-2 tablets by mouth every 4 (four) hours as needed for moderate pain. 01/17/16  Yes Monica Carter, DO  HYDROcodone-homatropine (HYCODAN) 5-1.5 MG/5ML syrup Take 5 mLs by mouth every 6 (six) hours as needed for cough. 01/17/16  Yes Monica Carter, DO  insulin glargine (LANTUS) 100 UNIT/ML injection Inject 0.25 mLs (25 Units total) into the skin daily. 01/16/16  Yes Reyne Dumas, MD  insulin lispro (HUMALOG) 100 UNIT/ML injection Inject into the skin 3 (three) times daily before meals. 60-149= 0 units 150-250= 5 units Cbg>250 = 8 units   Yes Historical Provider, MD  latanoprost (XALATAN) 0.005 % ophthalmic solution Place 1 drop into both eyes at  bedtime. 08/25/13  Yes Historical Provider, MD  Magnesium Oxide -Mg Supplement 250 MG TABS Take 250 mg by mouth every morning.   Yes Historical Provider, MD  metoprolol succinate (TOPROL-XL) 100 MG 24 hr tablet Take 1/2 tablet daily 12/29/15  Yes Ivan Anchors Love, PA-C  moxifloxacin (AVELOX) 400 MG tablet Take 400 mg by mouth daily. For 10 days 01/26/16 02/04/16 Yes Historical Provider, MD  Multiple Vitamins-Minerals (CENTRUM SILVER ULTRA MENS PO) Take 1 tablet by mouth daily.   Yes Historical Provider, MD  [facility] DELICA LANCETS [id number] MISC 1 each by Other route 2 (two) times daily. Reported on 01/18/2016 09/03/15  Yes Historical Provider, MD  Glory Rosebush VERIO test strip 1 strip by Other route 2 (two) times daily. Reported on 01/18/2016 09/03/15  Yes Historical Provider, MD  oxybutynin (DITROPAN-XL) 5 MG 24 hr tablet Take 5 mg by mouth daily.    Yes Historical Provider, MD  pantoprazole (PROTONIX) 40 MG tablet Take 40 mg by mouth daily.   Yes Historical Provider, MD  potassium chloride SA (KLOR-CON M20) 20 MEQ tablet Take 3 tablets  daily. Patient taking differently: Take 60 mEq by mouth daily. Take 3 tablets daily. 12/29/15  Yes Pamela S Love, PA-C  PROAIR HFA 108 (90 BASE) MCG/ACT inhaler Inhale 2 puffs into the lungs every 6 (six) hours as needed for wheezing or shortness of breath.    Yes Historical Provider, MD  RESTASIS 0.05 % ophthalmic emulsion Place 1 drop into both eyes 2 (two) times daily. 10/01/13  Yes Historical Provider, MD  Spacer/Aero-Holding Chambers (AEROCHAMBER PLUS FLO-VU) [place]  10/10/14  Yes Historical Provider, MD  STIOLTO RESPIMAT 2.5-2.5 MCG/ACT AERS USE 2 INHALATIONS ORALLY   DAILY 12/27/15  Yes Brand Males, MD  vitamin C (ASCORBIC ACID) 500 MG tablet Take 500 mg by mouth 2 (two) times daily.   Yes Historical Provider, MD  Vitamin D, Ergocalciferol, (DRISDOL) 50000 UNITS CAPS capsule Take 50,000 Units by mouth every 14 (fourteen) days. Every other week on Sunday 11/08/13  Yes Historical  Provider, MD  vitamin E 400 UNIT capsule Take 400 Units by mouth daily.   Yes Historical Provider, MD  ZETIA 10 MG tablet Take 10 mg by mouth daily. 10/30/15  Yes Historical Provider, MD  clopidogrel (PLAVIX) 75 MG tablet TAKE 1 TABLET DAILY Patient not taking: Reported on 02/03/2016 01/25/16   Troy Sine, MD  insulin aspart (NOVOLOG) 100 UNIT/ML injection  Correction coverage: Moderate (average weight, post-op)    CBG < 70: implement hypoglycemia protocol    CBG 70 - 120: 0 units    CBG 121 - 150: 2 units    CBG 151 - 200: 3 units    CBG 201 - 250: 5 units    CBG 251 - 300: 8 units    CBG 301 - 350: 11 units    CBG 351 - 400: 15 units    CBG > 400 call MD and obtain STAT lab verification Patient not taking: Reported on 02/03/2016 01/16/16   Reyne Dumas, MD  losartan-hydrochlorothiazide (HYZAAR) 100-25 MG tablet TAKE 1 TABLET DAILY Patient not taking: Reported on 02/03/2016 01/25/16   Troy Sine, MD  pioglitazone (ACTOS) 45 MG tablet Take 45 mg by mouth daily. 12/24/15   Historical Provider, MD   Allergies  Allergen Reactions  . Mold Extract [Trichophyton] Anaphylaxis  . Lipitor [Atorvastatin] Other (See Comments)    Weakness in legs/myalgias    FAMILY HISTORY:  Family History  Problem Relation Age of Onset  . Kidney disease Brother   . Breast cancer Sister   . Rheum arthritis Mother     SOCIAL HISTORY: Social History   Social History  . Marital Status: Widowed    Spouse Name: N/A  . Number of Children: N/A  . Years of Education: N/A   Occupational History  . retired    Social History Main Topics  . Smoking status: Former Smoker -- 1.00 packs/day for 30 years    Types: Cigarettes    Quit date: 12/24/1983  . Smokeless tobacco: Never Used     Comment: quit smoking in 1985  . Alcohol Use: 0.0 oz/week    0 Standard drinks or equivalent per week     Comment: rarely  . Drug Use: No  . Sexual Activity: Not Asked   Other Topics Concern  . None    Social History Narrative   Patient has no pets. No bird exposure. No asbestos or mold exposure. No recent travel. Originally from [place]. Previously worked in [employer] and also in the [employer].  REVIEW OF SYSTEMS:  No rashes or abnormal bruising. No lymphadenopathy in his neck, groin, or axilla. A pertinent 14 point review of systems is negative except as per the HPI.  SUBJECTIVE:   VITAL SIGNS: Temp:  [97.5 F (36.4 C)-99.6 F (37.6 C)] 98.6 F (37 C) (02/12 0800) Pulse Rate:  [31-107] 102 (02/12 1334) Resp:  [17-40] 40 (02/12 1334) BP: (101-136)/(46-102) 110/94 mmHg (02/12 1334) SpO2:  [85 %-100 %] 94 % (02/12 1342) FiO2 (%):  [40 %] 40 % (02/12 1342) Weight:  [82.7 kg (182 lb 5.1 oz)] 82.7 kg (182 lb 5.1 oz) (02/12 0400)  PHYSICAL EXAMINATION: General:  Awake. Alert. No acute distress. Laying in bed watching TV.  Integument:  Warm & dry. No rash on exposed skin. No bruising. Lymphatics:  No appreciated cervical or supraclavicular lymphadenoapthy. HEENT:  Moist mucus membranes. No oral ulcers. No scleral injection or icterus. . Cardiovascular:  Regular rate. No edema. No appreciable JVD.  Pulmonary:  Good aeration & clear to auscultation bilaterally. Symmetric chest wall expansion. No accessory muscle use. Slightly dull to percussion over right lung base. Abdomen: Soft. Normal bowel sounds. Nondistended. Grossly nontender. Musculoskeletal:  Normal bulk and tone. Hand grip strength 5/5 bilaterally. No joint deformity or effusion appreciated. Neurological:  CN 2-12 grossly in tact. No meningismus. Moving all 4 extremities equally. Symmetric brachioradialis deep tendon reflexes. Psychiatric:  Mood and affect congruent. Speech normal rhythm, rate & tone.    Recent Labs Lab 02/03/16 1310 02/04/16 0416  NA 135 137  K 5.2* 5.0  CL 103 109  CO2 19* 17*  BUN 21* 24*  CREATININE 1.54* 1.40*  GLUCOSE 190* 229*    Recent Labs Lab  02/03/16 1310 02/04/16 0416  HGB 9.1* 8.1*  HCT 29.2* 25.9*  WBC 18.5* 13.1*  PLT 318 229   Dg Chest 2 View  02/03/2016  CLINICAL DATA:  82 year old nursing home patient presenting with acute onset of cough, fever, shortness of breath and chest pain which began last night. EXAM: CHEST  2 VIEW COMPARISON:  01/09/2016 and earlier. FINDINGS: AP semi-erect and lateral images were obtained. Prior sternotomy for CABG. Cardiac silhouette markedly enlarged, unchanged. Thoracic aorta atherosclerotic and tortuous, unchanged. Hilar and mediastinal contours otherwise unremarkable. Dense airspace consolidation in the right upper lobe, right lower lobe and right middle lobe associated with a moderate-sized right pleural effusion. Minimal patchy airspace opacities in the left lower lobe. Mild pulmonary venous hypertension without overt edema. IMPRESSION: Acute pneumonia involving the right upper lobe, right middle lobe, right lower lobe and to a lesser degree the left lower lobe. Moderate size right parapneumonic effusion. Electronically Signed   By: Evangeline Dakin M.D.   On: 02/03/2016 14:03   Ct Angio Chest Pe W/cm &/or Wo Cm  02/04/2016  CLINICAL DATA:  82 year old male with chest pain, shortness of breath and lower extremity DVT. EXAM: CT ANGIOGRAPHY CHEST WITH CONTRAST TECHNIQUE: Multidetector CT imaging of the chest was performed using the standard protocol during bolus administration of intravenous contrast. Multiplanar CT image reconstructions and MIPs were obtained to evaluate the vascular anatomy. CONTRAST:  147mL OMNIPAQUE IOHEXOL 350 MG/ML SOLN COMPARISON:  02/03/2016 and prior radiographs. 10/24/2014 noncontrast chest CT. FINDINGS: This is a technically satisfactory study. Mediastinum/Nodes: Pulmonary emboli are identified within the distal main right pulmonary artery, right upper and lower lobes and left lower lobe. RV/LV ratio is 1.1, compatible with right heart strain. Cardiomegaly, coronary disease  and cardiac surgical changes noted. There is no evidence of thoracic aortic  aneurysm. No pericardial effusion or enlarged lymph nodes identified. Lungs/Pleura: A moderate right pleural effusion is identified. Focal consolidation within the right upper lobe and right lower lobe noted and may represent atelectasis/ infarct changes and/or infection. Moderate centrilobular and paraseptal emphysema noted. Patchy left lower lobe basilar opacity probably represents atelectasis/infarct changes. There is no evidence of pneumothorax. Upper abdomen: No acute abnormalities. Musculoskeletal: No acute or suspicious abnormalities. Review of the MIP images confirms the above findings. IMPRESSION: Bilateral pulmonary emboli with CT evidence of right heart strain (RV/LV Ratio = 1.1) consistent with at least submassive (intermediate risk) PE. The presence of right heart strain has been associated with an increased risk of morbidity and mortality. Please activate Code PE by paging [phone]. Scattered right lung consolidation and mild left basilar opacity - question atelectasis/infarct changes and/ or pneumonia. Moderate right pleural effusion. Emphysema. Critical Value/emergent results were called by telephone at the time of interpretation on 02/04/2016 at 12:30 pm to [name], RN, who verbally acknowledged these results. Electronically Signed   By: Margarette Canada M.D.   On: 02/04/2016 12:31    ASSESSMENT / PLAN:  82 year old male with bilateral lower extremity DVT and bilateral pulmonary emboli with lung infarction. I believe the probability of an infectious process is low given his constellation of symptoms above. Certainly assessing for possible infection with a serum Procalcitonin is reasonable at this time. The pleural effusion on the right appears small to moderate on my review of imaging and could still be secondary to the patient's lung infarction and acute pulmonary emboli. Initiating systemic anticoagulation and may be more  prudent to perform a bedside thoracentesis to assess for possible parapneumonic effusion.  1. Possible healthcare associated pneumonia: Currently on day #2 of them. Vancomycin & cefepime. Checking serum Procalcitonin. Consider thoracentesis to evaluate for possible parapneumonic effusion and rapid de-escalation of antibiotics depending upon results of these tests. 2. Bilateral pulmonary emboli/DVT: Started on heparin drip per pharmacy protocol by primary service. I have also ordered cardiac biomarkers and a transthoracic echocardiogram to evaluate right ventricular function. 3. Acute hypoxic respiratory failure: Likely secondary to bilateral pulmonary emboli & lung infarct.  [name] Ashok Cordia, M.D. [facility] Pulmonary & Critical Care Pager:  [phone] After 3pm or if no response, call [phone] 02/04/2016, 1:45 PM

## 2016-02-04 NOTE — Progress Notes (Addendum)
Initial Nutrition Assessment  INTERVENTION:   Provide Ensure Enlive po BID, each supplement provides 350 kcal and 20 grams of protein  RD to follow-up to complete assessment.  NUTRITION DIAGNOSIS:   No nutrition diagnosis at this time. Will diagnose at follow-up.  GOAL:   Patient will meet greater than or equal to 90% of their needs  MONITOR:   PO intake, Labs, Weight trends, I & O's  REASON FOR ASSESSMENT:   Malnutrition Screening Tool    ASSESSMENT:   82 y.o. male with the past medical history of hypertension, coronary artery disease, is oxygen dependent COPD, chronic kidney disease, severe aortic stenosis, recent stay in the hospital for Staphylococcus aureus bacteremia and healthcare associated pneumonia, presents from his skilled nursing facility with the complaints of shortness of breath, cough and fever..  Pt not in room at time of visit. Per EPIC, pt having CT. Pt was assessed by nutrition during previous admission in January. Pt was ordered Ensure supplements at that time. This RD to order Ensure for patient. Pt currently on a dysphagia 2 diet. No PO has been documented.  Per H&P, pt reports choking on food occasionally.   Per weight history, pt has lost 22 lb since 12/30 (11% weight loss x 6 weeks, significant for time frame).   Medications: ferrous sulfate tablet BID   Labs reviewed: CBGs: 171-213 Elevated BUN, Creatinine  Diet Order:  DIET DYS 2 Room service appropriate?: Yes; Fluid consistency:: Thin  Skin:  Reviewed, no issues  Last BM:  2/11  Height:   Ht Readings from Last 1 Encounters:  01/18/16 6' (1.829 m)    Weight:   Wt Readings from Last 1 Encounters:  02/04/16 182 lb 5.1 oz (82.7 kg)    Ideal Body Weight:  80.9 kg  BMI:  Body mass index is 24.72 kg/(m^2).  Estimated Nutritional Needs:   Kcal:  2000-2200  Protein:  95-105g  Fluid:  2-2.2L/day  EDUCATION NEEDS:   No education needs identified at this time  Clayton Bibles,  MS, RD, LDN Pager: [phone] After Hours Pager: [phone]

## 2016-02-04 NOTE — Progress Notes (Signed)
Per patient request notified patient's daughter [name] ) of the current plan of care and treatments. Daughter states she will be by hospital  later today.

## 2016-02-04 NOTE — Progress Notes (Signed)
CRITICAL VALUE ALERT  Critical value received: Troponin 1.05 Date of notification:  02/04/16  Time of notification:  [id number]  Critical value read back: yes   Nurse who received alert:  Barnetta Chapel  MD notified (1st page):  Dr. Grandville Silos  Time of first page:  1520  MD notified (2nd page):  Time of second page:  Responding MD:  Dr. Grandville Silos   Time MD responded: [phone]

## 2016-02-05 ENCOUNTER — Inpatient Hospital Stay (HOSPITAL_COMMUNITY): Payer: Medicare Other

## 2016-02-05 ENCOUNTER — Encounter: Payer: Medicare Other | Admitting: Occupational Therapy

## 2016-02-05 ENCOUNTER — Ambulatory Visit: Payer: Medicare Other | Admitting: Physical Therapy

## 2016-02-05 ENCOUNTER — Encounter (HOSPITAL_COMMUNITY): Payer: Self-pay | Admitting: Physician Assistant

## 2016-02-05 ENCOUNTER — Other Ambulatory Visit: Payer: Self-pay | Admitting: *Deleted

## 2016-02-05 DIAGNOSIS — I25709 Atherosclerosis of coronary artery bypass graft(s), unspecified, with unspecified angina pectoris: Secondary | ICD-10-CM

## 2016-02-05 DIAGNOSIS — I82409 Acute embolism and thrombosis of unspecified deep veins of unspecified lower extremity: Secondary | ICD-10-CM

## 2016-02-05 DIAGNOSIS — I5032 Chronic diastolic (congestive) heart failure: Secondary | ICD-10-CM

## 2016-02-05 DIAGNOSIS — I2699 Other pulmonary embolism without acute cor pulmonale: Secondary | ICD-10-CM | POA: Diagnosis present

## 2016-02-05 DIAGNOSIS — I82403 Acute embolism and thrombosis of unspecified deep veins of lower extremity, bilateral: Secondary | ICD-10-CM | POA: Diagnosis present

## 2016-02-05 DIAGNOSIS — R7401 Elevation of levels of liver transaminase levels: Secondary | ICD-10-CM | POA: Diagnosis not present

## 2016-02-05 DIAGNOSIS — I10 Essential (primary) hypertension: Secondary | ICD-10-CM

## 2016-02-05 DIAGNOSIS — I350 Nonrheumatic aortic (valve) stenosis: Secondary | ICD-10-CM

## 2016-02-05 DIAGNOSIS — K219 Gastro-esophageal reflux disease without esophagitis: Secondary | ICD-10-CM

## 2016-02-05 DIAGNOSIS — R740 Nonspecific elevation of levels of transaminase and lactic acid dehydrogenase [LDH]: Secondary | ICD-10-CM

## 2016-02-05 DIAGNOSIS — J189 Pneumonia, unspecified organism: Secondary | ICD-10-CM

## 2016-02-05 DIAGNOSIS — I82401 Acute embolism and thrombosis of unspecified deep veins of right lower extremity: Secondary | ICD-10-CM

## 2016-02-05 DIAGNOSIS — I6312 Cerebral infarction due to embolism of basilar artery: Secondary | ICD-10-CM

## 2016-02-05 LAB — TROPONIN I: Troponin I: 0.96 ng/mL (ref <0.031)

## 2016-02-05 LAB — GLUCOSE, CAPILLARY
GLUCOSE-CAPILLARY: 151 mg/dL — AB (ref 65–99)
GLUCOSE-CAPILLARY: 211 mg/dL — AB (ref 65–99)
GLUCOSE-CAPILLARY: 238 mg/dL — AB (ref 65–99)
Glucose-Capillary: 261 mg/dL — ABNORMAL HIGH (ref 65–99)

## 2016-02-05 LAB — CBC WITH DIFFERENTIAL/PLATELET
BASOS PCT: 0 %
Basophils Absolute: 0 10*3/uL (ref 0.0–0.1)
EOS ABS: 0 10*3/uL (ref 0.0–0.7)
Eosinophils Relative: 0 %
HCT: 24.7 % — ABNORMAL LOW (ref 39.0–52.0)
Hemoglobin: 7.6 g/dL — ABNORMAL LOW (ref 13.0–17.0)
Lymphocytes Relative: 12 %
Lymphs Abs: 1.1 10*3/uL (ref 0.7–4.0)
MCH: 27.5 pg (ref 26.0–34.0)
MCHC: 30.8 g/dL (ref 30.0–36.0)
MCV: 89.5 fL (ref 78.0–100.0)
MONO ABS: 0.6 10*3/uL (ref 0.1–1.0)
MONOS PCT: 6 %
Neutro Abs: 7.8 10*3/uL — ABNORMAL HIGH (ref 1.7–7.7)
Neutrophils Relative %: 82 %
Platelets: 261 10*3/uL (ref 150–400)
RBC: 2.76 MIL/uL — ABNORMAL LOW (ref 4.22–5.81)
RDW: 21.6 % — AB (ref 11.5–15.5)
WBC: 9.5 10*3/uL (ref 4.0–10.5)

## 2016-02-05 LAB — COMPREHENSIVE METABOLIC PANEL
ALBUMIN: 2.2 g/dL — AB (ref 3.5–5.0)
ALK PHOS: 267 U/L — AB (ref 38–126)
ALT: 336 U/L — AB (ref 17–63)
AST: 687 U/L — ABNORMAL HIGH (ref 15–41)
Anion gap: 10 (ref 5–15)
BUN: 25 mg/dL — AB (ref 6–20)
CALCIUM: 7.7 mg/dL — AB (ref 8.9–10.3)
CO2: 18 mmol/L — AB (ref 22–32)
CREATININE: 1.39 mg/dL — AB (ref 0.61–1.24)
Chloride: 108 mmol/L (ref 101–111)
GFR calc Af Amer: 52 mL/min — ABNORMAL LOW (ref >60)
GFR calc non Af Amer: 45 mL/min — ABNORMAL LOW (ref >60)
GLUCOSE: 206 mg/dL — AB (ref 65–99)
Potassium: 4.2 mmol/L (ref 3.5–5.1)
SODIUM: 136 mmol/L (ref 135–145)
Total Bilirubin: 0.6 mg/dL (ref 0.3–1.2)
Total Protein: 6.5 g/dL (ref 6.5–8.1)

## 2016-02-05 LAB — HEPARIN LEVEL (UNFRACTIONATED)
Heparin Unfractionated: 0.67 IU/mL (ref 0.30–0.70)
Heparin Unfractionated: 0.48 IU/mL (ref 0.30–0.70)

## 2016-02-05 LAB — MAGNESIUM: MAGNESIUM: 2.2 mg/dL (ref 1.7–2.4)

## 2016-02-05 LAB — STREP PNEUMONIAE URINARY ANTIGEN: Strep Pneumo Urinary Antigen: NEGATIVE

## 2016-02-05 LAB — PROCALCITONIN: Procalcitonin: 5.34 ng/mL

## 2016-02-05 LAB — LACTIC ACID, PLASMA: LACTIC ACID, VENOUS: 1.4 mmol/L (ref 0.5–2.0)

## 2016-02-05 MED ORDER — HEPARIN (PORCINE) IN NACL 100-0.45 UNIT/ML-% IJ SOLN
1300.0000 [IU]/h | INTRAMUSCULAR | Status: DC
  Administered 2016-02-06 – 2016-02-09 (×4): 1250 [IU]/h via INTRAVENOUS
  Administered 2016-02-10 – 2016-02-12 (×4): 1300 [IU]/h via INTRAVENOUS
  Filled 2016-02-05 (×14): qty 250

## 2016-02-05 MED ORDER — FUROSEMIDE 10 MG/ML IJ SOLN
40.0000 mg | Freq: Two times a day (BID) | INTRAMUSCULAR | Status: DC
  Administered 2016-02-05 – 2016-02-12 (×14): 40 mg via INTRAVENOUS
  Filled 2016-02-05 (×16): qty 4

## 2016-02-05 MED ORDER — PRO-STAT SUGAR FREE PO LIQD
30.0000 mL | Freq: Two times a day (BID) | ORAL | Status: DC
  Administered 2016-02-05 – 2016-02-13 (×12): 30 mL via ORAL
  Filled 2016-02-05 (×17): qty 30

## 2016-02-05 MED ORDER — PREMIER PROTEIN SHAKE
8.0000 [oz_av] | Freq: Two times a day (BID) | ORAL | Status: DC
  Administered 2016-02-05 – 2016-02-12 (×7): 8 [oz_av] via ORAL
  Filled 2016-02-05 (×9): qty 325.31

## 2016-02-05 NOTE — Patient Outreach (Signed)
[facility]) Care Management  02/05/2016  Laila Condit [birth year] [id number]   CSW sent an In Basket Message to patient's RNCM with [facility], Raina Mina ensuring that she is aware of patient's recent hospital admission from [name] [place], where patient was residing to receive short-term rehabilitative services, on Saturday, February 03, 2016.  CSW will continue to follow patient while hospitalized to assess and assist with discharge planning needs and services.  Nat Christen, BSW, MSW, LCSW  Licensed Education officer, [facility]  Mailing [place] [address], [place], [place] [address] Physical Address-[address], [place], [place] [address] Toll Free Main # [phone] Fax # [phone] Cell # [phone]  Fax # [phone]  [email or web address] complies with [employer] civil rights laws and does not discriminate on the basis of race, color, national origin, age, disability, or sex.  Espaol (Spanish)  [facility] cumple con las leyes federales de derechos civiles aplicables y no discrimina por motivos de raza, color, nacionalidad, edad, discapacidad o sexo.     Ti?ng Vi?t ([place])  [place] tun th? lu?t dn quy?n hi?n hnh c?a Lin bang v khng phn bi?t ?i x? d?a trn ch?ng t?c, mu da, ngu?n g?c qu?c gia, ? tu?i, khuy?t t?t, ho?c gi?i tnh.     (Arabic)    Freedom

## 2016-02-05 NOTE — Consult Note (Signed)
Cardiology Consultation Note    Patient ID: Douglas Carrillo, MRN: [id number], DOB/AGE: 1928-01-14 82 y.o. Admit date: 02/03/2016   Date of Consult: 02/05/2016 Primary Physician: Limmie Patricia, MD Primary Cardiologist: Dr. Claiborne Billings  Chief Complaint: weakness, cough Reason for Consultation: elevated troponin, possible RHF (recent readmission, here with +DVT/submassive PE) Requesting MD: Dr. Grandville Silos  HPI: Douglas Carrillo is an 82 y/o M with history of CAD s/p CABGx4 2000, stroke 11/2015, severe AS, HTN, DM, COPD with chronic respiratory failure, OSA, anemia, prostate CA, GERD, staph bacteremia, mod-severe pulm HTN, recent variable EF, NSVT, CKD II whom we are asked to see for elevated troponin and possible RHF. He has had complex history recently. In 11/2015 he presented with dyspnea and mildly elevated troponins. He underwent [facility] 12/12/15 showing patent grafts but +mod-severe pulm HTN with moderately severe AS and EF 30-35%. TCTS consult for TAVR was planned but the patient developed right sided weakness post-cath and ruled in for acute CVA felt embolic in nature. He was continued on Plavix for secondary prevention. He was readmitted 1/17-1/24/17 with sepsis felt due to HCAP with staph aureus bacteremia. TEE 01/12/16: severe AS with heavy calcification, mild-mod AI, normal LV function, mod LVH, no vegetations seen ("No echo evidence for endocarditis. Heavy aortic valve degenerative calcification could mask a small vegetation. Consider repeat TEE in 2 weeks if there is persistent bacteremia"). Troponins peaked at 2.98 during that admission, with recommendation to continue to manage medically given recent cath and no active angina. He was discharged on IV abx via peripheral line (patient/family refused PICC). Per notes he required home O2 QHS at dc. He was then readmitted 02/03/16 with complaints of weakness, cough, dyspnea and wheezing. Workup notable for tachycardia, hypoxia, lactic acidosis and possible  recurrent R-sided HCAP. LE duplex 02/04/16: + DVT noted in the right peroneal veins, left distal popliteal vein, left soleal veins, and left peroneal veins. CTA 02/04/16 showed bilateral PE with evidence of right heart strain c/w at least submassive PE, scattered right lung consolitation and mild left basilar opacity question atx vs PNA, mod right pleural effusion, emphysema. He was started on IV heparin. PCCM following - they ordered troponins which again returned positive 1.05->1.04->0.96. Resp panel pending. Labs otherwise notable for procalcitonin peak 6.2, progressive anemia with Hgb 7.6, peak Cr 1.54 (now Cr 1.39, baseline said to be 1.3), transaminitis with AST/ALT 687/336, albumin 2.2, alk phos 267. 2D echo pending. Blood cx negative thus far.  He says he feels better daily but is still plagued by cough. He says prior to admission he was actually doing well with rehab at [facility] - had not had any recent CP or SOB with PT. He denies any present CP, SOB. No recent evidence of bleeding. No syncope.   Past Medical History  Diagnosis Date  . Hypertension   . High cholesterol   . Prostate cancer ([place])   . OSA (obstructive sleep apnea)     Not currently on CPAP/uses 2 L/m while sleeping  . COPD (chronic obstructive pulmonary disease) (HCC)     MODERATE  . Diabetes mellitus ([place])   . GERD (gastroesophageal reflux disease)   . Vertigo   . Arthritis     OA  . Anemia   . Chronic diastolic (congestive) heart failure ([place])   . CKD (chronic kidney disease), stage II   . Stroke (cerebrum) ([place])     a. 11/2015 - following cardiac cath.  Marland Kitchen HCAP (healthcare-associated pneumonia)     12/2015  .  CAD (coronary artery disease)     a.  s/p CABG 2000. b. [facility] 11/2015: showing patent grafts but +mod-severe pulm HTN with moderately severe AS and EF 30-35%. TAVR consult planned but patient developed post-cath stroke.  . Severe aortic stenosis   . Staphylococcus aureus bacteremia     a. Dx 12/2015.  .  Moderate to severe pulmonary hypertension ([place])     a. By [facility] 11/2105.  Marland Kitchen NSVT (nonsustained ventricular tachycardia) ([place])   . Chronic respiratory failure ([place])   . DVT (deep venous thrombosis) ([place])     a. Dx 01/2016 + DVT noted in the right peroneal veins, left distal popliteal vein, left soleal veins, and left peroneal veins.      Surgical History:  Past Surgical History  Procedure Laterality Date  . Coronary artery bypass graft  03/29/1999    x 4:  LIMA to the LAD,SVG to the diagonal branch of the LAD,SVG to the intermediate coronary artery & SVG to the posterior descending branch of the RCA.  . Colon surgery      x 4  . Nm myocar perf wall motion  04/28/2012    Low Risk Scan  . Prostate surgery    . Cardiac catheterization    . Cardiac catheterization N/A 12/12/2015    Procedure: Right/Left Heart Cath and Coronary/Graft Angiography;  Surgeon: Troy Sine, MD;  Location: [place] [facility];  Service: Cardiovascular;  Laterality: N/A;  . Peripheral vascular catheterization N/A 12/12/2015    Procedure: Aortic Arch Angiography;  Surgeon: Troy Sine, MD;  Location: [place] [facility];  Service: Cardiovascular;  Laterality: N/A;  . Eye surgery    . Tee without cardioversion N/A 01/12/2016    Procedure: TRANSESOPHAGEAL ECHOCARDIOGRAM (TEE);  Surgeon: Sanda Klein, MD;  Location: [facility] ENDOSCOPY;  Service: Cardiovascular;  Laterality: N/A;     Home Meds: Prior to Admission medications   Medication Sig Start Date End Date Taking? Authorizing Provider  acetaminophen (TYLENOL) 325 MG tablet Take 650 mg by mouth every 4 (four) hours as needed for mild pain or headache. 02/03/16 02/05/16 Yes Historical Provider, MD  Amino Acids-Protein Hydrolys (FEEDING SUPPLEMENT, PRO-STAT SUGAR FREE 64,) LIQD Take 30 mLs by mouth 2 (two) times daily.   Yes Historical Provider, MD  b complex vitamins capsule Take 1 capsule by mouth daily.   Yes Historical Provider, MD  B-D ULTRAFINE III SHORT PEN 31G X 8 MM  MISC Reported on 01/18/2016 10/19/14  Yes Historical Provider, MD  benzonatate (TESSALON) 100 MG capsule Take 100 mg by mouth 3 (three) times daily. 11/24/15  Yes Historical Provider, MD  CRESTOR 20 MG tablet Take 20 mg by mouth daily.  10/01/13  Yes Historical Provider, MD  ferrous sulfate 325 (65 FE) MG tablet Take 325 mg by mouth 2 (two) times daily with a meal.   Yes Historical Provider, MD  furosemide (LASIX) 40 MG tablet TAKE 1 TABLET DAILY 11/27/15  Yes Troy Sine, MD  HYDROcodone-acetaminophen (NORCO/VICODIN) 5-325 MG tablet Take 1-2 tablets by mouth every 4 (four) hours as needed for moderate pain. 01/17/16  Yes Monica Carter, DO  HYDROcodone-homatropine (HYCODAN) 5-1.5 MG/5ML syrup Take 5 mLs by mouth every 6 (six) hours as needed for cough. 01/17/16  Yes Monica Carter, DO  insulin glargine (LANTUS) 100 UNIT/ML injection Inject 0.25 mLs (25 Units total) into the skin daily. 01/16/16  Yes Reyne Dumas, MD  insulin lispro (HUMALOG) 100 UNIT/ML injection Inject into the skin 3 (three) times daily before  meals. 60-149= 0 units 150-250= 5 units Cbg>250 = 8 units   Yes Historical Provider, MD  latanoprost (XALATAN) 0.005 % ophthalmic solution Place 1 drop into both eyes at bedtime. 08/25/13  Yes Historical Provider, MD  Magnesium Oxide -Mg Supplement 250 MG TABS Take 250 mg by mouth every morning.   Yes Historical Provider, MD  metoprolol succinate (TOPROL-XL) 100 MG 24 hr tablet Take 1/2 tablet daily 12/29/15  Yes Evlyn Kanner Love, PA-C  Multiple Vitamins-Minerals (CENTRUM SILVER ULTRA MENS PO) Take 1 tablet by mouth daily.   Yes Historical Provider, MD  [facility] DELICA LANCETS 33G MISC 1 each by Other route 2 (two) times daily. Reported on 01/18/2016 09/03/15  Yes Historical Provider, MD  Letta Pate VERIO test strip 1 strip by Other route 2 (two) times daily. Reported on 01/18/2016 09/03/15  Yes Historical Provider, MD  oxybutynin (DITROPAN-XL) 5 MG 24 hr tablet Take 5 mg by mouth daily.    Yes Historical  Provider, MD  pantoprazole (PROTONIX) 40 MG tablet Take 40 mg by mouth daily.   Yes Historical Provider, MD  potassium chloride SA (KLOR-CON M20) 20 MEQ tablet Take 3 tablets daily. Patient taking differently: Take 60 mEq by mouth daily. Take 3 tablets daily. 12/29/15  Yes Pamela S Love, PA-C  PROAIR HFA 108 (90 BASE) MCG/ACT inhaler Inhale 2 puffs into the lungs every 6 (six) hours as needed for wheezing or shortness of breath.    Yes Historical Provider, MD  RESTASIS 0.05 % ophthalmic emulsion Place 1 drop into both eyes 2 (two) times daily. 10/01/13  Yes Historical Provider, MD  Spacer/Aero-Holding Chambers (AEROCHAMBER PLUS FLO-VU) MISC  10/10/14  Yes Historical Provider, MD  STIOLTO RESPIMAT 2.5-2.5 MCG/ACT AERS USE 2 INHALATIONS ORALLY   DAILY 12/27/15  Yes Kalman Shan, MD  vitamin C (ASCORBIC ACID) 500 MG tablet Take 500 mg by mouth 2 (two) times daily.   Yes Historical Provider, MD  Vitamin D, Ergocalciferol, (DRISDOL) 50000 UNITS CAPS capsule Take 50,000 Units by mouth every 14 (fourteen) days. Every other week on Sunday 11/08/13  Yes Historical Provider, MD  vitamin E 400 UNIT capsule Take 400 Units by mouth daily.   Yes Historical Provider, MD  ZETIA 10 MG tablet Take 10 mg by mouth daily. 10/30/15  Yes Historical Provider, MD  clopidogrel (PLAVIX) 75 MG tablet TAKE 1 TABLET DAILY Patient not taking: Reported on 02/03/2016 01/25/16   Lennette Bihari, MD  insulin aspart (NOVOLOG) 100 UNIT/ML injection  Correction coverage: Moderate (average weight, post-op)    CBG < 70: implement hypoglycemia protocol    CBG 70 - 120: 0 units    CBG 121 - 150: 2 units    CBG 151 - 200: 3 units    CBG 201 - 250: 5 units    CBG 251 - 300: 8 units    CBG 301 - 350: 11 units    CBG 351 - 400: 15 units    CBG > 400 call MD and obtain STAT lab verification Patient not taking: Reported on 02/03/2016 01/16/16   Richarda Overlie, MD  losartan-hydrochlorothiazide (HYZAAR) 100-25 MG tablet TAKE 1  TABLET DAILY Patient not taking: Reported on 02/03/2016 01/25/16   Lennette Bihari, MD  pioglitazone (ACTOS) 45 MG tablet Take 45 mg by mouth daily. 12/24/15   Historical Provider, MD    Inpatient Medications:  . arformoterol  15 mcg Nebulization BID  . azithromycin  500 mg Intravenous Daily  . budesonide (PULMICORT) nebulizer solution  0.25 mg  Nebulization BID  . ceFEPime (MAXIPIME) IV  2 g Intravenous Q24H  . clopidogrel  75 mg Oral Daily  . docusate sodium  100 mg Oral BID  . feeding supplement (ENSURE ENLIVE)  237 mL Oral BID BM  . ferrous sulfate  325 mg Oral BID WC  . fluticasone  2 spray Each Nare Daily  . furosemide  40 mg Intravenous Q12H  . guaiFENesin  1,200 mg Oral BID  . insulin aspart  0-15 Units Subcutaneous TID WC  . insulin glargine  25 Units Subcutaneous Daily  . ipratropium  0.5 mg Nebulization Q6H  . latanoprost  1 drop Both Eyes QHS  . levalbuterol  0.63 mg Nebulization Q6H  . loratadine  10 mg Oral Daily  . metoprolol succinate  25 mg Oral Daily  . oxybutynin  5 mg Oral Daily  . pantoprazole  40 mg Oral Q0600  . rosuvastatin  20 mg Oral Daily  . sodium chloride flush  3 mL Intravenous Q12H  . vancomycin  1,250 mg Intravenous Q24H   . heparin      Allergies:  Allergies  Allergen Reactions  . Mold Extract [Trichophyton] Anaphylaxis  . Lipitor [Atorvastatin] Other (See Comments)    Weakness in legs/myalgias    Social History   Social History  . Marital Status: Widowed    Spouse Name: N/A  . Number of Children: N/A  . Years of Education: N/A   Occupational History  . retired    Social History Main Topics  . Smoking status: Former Smoker -- 1.00 packs/day for 30 years    Types: Cigarettes    Quit date: 12/24/1983  . Smokeless tobacco: Never Used     Comment: quit smoking in 1985  . Alcohol Use: 0.0 oz/week    0 Standard drinks or equivalent per week     Comment: rarely  . Drug Use: No  . Sexual Activity: Not on file   Other Topics Concern  .  Not on file   Social History Narrative   Patient has no pets. No bird exposure. No asbestos or mold exposure. No recent travel. Originally from [place]. Previously worked in [employer] and also in the [employer].     Family History  Problem Relation Age of Onset  . Kidney disease Brother   . Breast cancer Sister   . Rheum arthritis Mother      Review of Systems: No recurrent fevers or chills. All other systems reviewed and are otherwise negative except as noted above.  Labs:  Recent Labs  02/04/16 1435 02/04/16 1931 02/05/16 0150  TROPONINI 1.05* 1.04* 0.96*   Lab Results  Component Value Date   WBC 9.5 02/05/2016   HGB 7.6* 02/05/2016   HCT 24.7* 02/05/2016   MCV 89.5 02/05/2016   PLT 261 02/05/2016    Recent Labs Lab 02/05/16 0150  NA 136  K 4.2  CL 108  CO2 18*  BUN 25*  CREATININE 1.39*  CALCIUM 7.7*  PROT 6.5  BILITOT 0.6  ALKPHOS 267*  ALT 336*  AST 687*  GLUCOSE 206*   Lab Results  Component Value Date   CHOL 105 12/14/2015   HDL 37* 12/14/2015   LDLCALC 53 12/14/2015   TRIG 76 12/14/2015   No results found for: DDIMER  Radiology/Studies:  Dg Chest 2 View  02/03/2016  CLINICAL DATA:  82 year old nursing home patient presenting with acute onset of cough, fever, shortness of breath and chest pain which began  last night. EXAM: CHEST  2 VIEW COMPARISON:  01/09/2016 and earlier. FINDINGS: AP semi-erect and lateral images were obtained. Prior sternotomy for CABG. Cardiac silhouette markedly enlarged, unchanged. Thoracic aorta atherosclerotic and tortuous, unchanged. Hilar and mediastinal contours otherwise unremarkable. Dense airspace consolidation in the right upper lobe, right lower lobe and right middle lobe associated with a moderate-sized right pleural effusion. Minimal patchy airspace opacities in the left lower lobe. Mild pulmonary venous hypertension without overt edema. IMPRESSION: Acute pneumonia involving the  right upper lobe, right middle lobe, right lower lobe and to a lesser degree the left lower lobe. Moderate size right parapneumonic effusion. Electronically Signed   By: Evangeline Dakin M.D.   On: 02/03/2016 14:03   Dg Chest 2 View  01/09/2016  CLINICAL DATA:  Cough and shortness of breath for a week. EXAM: CHEST  2 VIEW COMPARISON:  January 04, 2016. FINDINGS: Stable cardiomegaly. Status post coronary artery bypass graft. No pneumothorax or significant pleural effusion is noted. There is interval development of right middle and lower lobe airspace opacities concerning for pneumonia. Left lung is clear. Bony thorax is unremarkable. IMPRESSION: Findings consistent with right middle and lower lobe pneumonia. Continued radiographic follow-up is recommended to ensure resolution. Electronically Signed   By: Marijo Conception, M.D.   On: 01/09/2016 08:08   Ct Angio Chest Pe W/cm &/or Wo Cm  02/04/2016  CLINICAL DATA:  82 year old male with chest pain, shortness of breath and lower extremity DVT. EXAM: CT ANGIOGRAPHY CHEST WITH CONTRAST TECHNIQUE: Multidetector CT imaging of the chest was performed using the standard protocol during bolus administration of intravenous contrast. Multiplanar CT image reconstructions and MIPs were obtained to evaluate the vascular anatomy. CONTRAST:  171m OMNIPAQUE IOHEXOL 350 MG/ML SOLN COMPARISON:  02/03/2016 and prior radiographs. 10/24/2014 noncontrast chest CT. FINDINGS: This is a technically satisfactory study. Mediastinum/Nodes: Pulmonary emboli are identified within the distal main right pulmonary artery, right upper and lower lobes and left lower lobe. RV/LV ratio is 1.1, compatible with right heart strain. Cardiomegaly, coronary disease and cardiac surgical changes noted. There is no evidence of thoracic aortic aneurysm. No pericardial effusion or enlarged lymph nodes identified. Lungs/Pleura: A moderate right pleural effusion is identified. Focal consolidation within the  right upper lobe and right lower lobe noted and may represent atelectasis/ infarct changes and/or infection. Moderate centrilobular and paraseptal emphysema noted. Patchy left lower lobe basilar opacity probably represents atelectasis/infarct changes. There is no evidence of pneumothorax. Upper abdomen: No acute abnormalities. Musculoskeletal: No acute or suspicious abnormalities. Review of the MIP images confirms the above findings. IMPRESSION: Bilateral pulmonary emboli with CT evidence of right heart strain (RV/LV Ratio = 1.1) consistent with at least submassive (intermediate risk) PE. The presence of right heart strain has been associated with an increased risk of morbidity and mortality. Please activate Code PE by paging [phone] Scattered right lung consolidation and mild left basilar opacity - question atelectasis/infarct changes and/ or pneumonia. Moderate right pleural effusion. Emphysema. Critical Value/emergent results were called by telephone at the time of interpretation on 02/04/2016 at 12:30 pm to KMaudie Mercury RN, who verbally acknowledged these results. Electronically Signed   By: JMargarette CanadaM.D.   On: 02/04/2016 12:31   Dg Swallowing Func-speech Pathology  01/10/2016  Objective Swallowing Evaluation:   Patient Details Name: CJessie SchrieberMRN: [id number] of Birth: [birth year]Today's Date: 01/10/2016 Time: SLP Start Time (ACUTE ONLY): 0930-SLP Stop Time (ACUTE ONLY): 0945 SLP Time Calculation (min) (ACUTE ONLY): 15 min Past Medical History: Past  Medical History Diagnosis Date . Hypertension  . High cholesterol  . Prostate cancer ([place])  . CAD (coronary artery disease)  . OSA (obstructive sleep apnea)  . COPD (chronic obstructive pulmonary disease) (HCC)    MODERATE . Diabetes mellitus ([place])  . GERD (gastroesophageal reflux disease)  . Vertigo  . Arthritis    OA . Anemia  . CHF (congestive heart failure) (HCC)    grade 2 diastolic dysfuction . CKD (chronic kidney disease)  . Stroke (cerebrum) ([place])     11/2015 . HCAP (healthcare-associated pneumonia)    12/2015 Past Surgical History: Past Surgical History Procedure Laterality Date . Coronary artery bypass graft  03/29/1999   x 4:  LIMA to the LAD,SVG to the diagonal branch of the LAD,SVG to the intermediate coronary artery & SVG to the posterior descending branch of the RCA. . Colon surgery     x 4 . Nm myocar perf wall motion  04/28/2012   Low Risk Scan . Prostate surgery   . Cardiac catheterization   . Cardiac catheterization N/A 12/12/2015   Procedure: Right/Left Heart Cath and Coronary/Graft Angiography;  Surgeon: Troy Sine, MD;  Location: [place] [facility];  Service: Cardiovascular;  Laterality: N/A; . Peripheral vascular catheterization N/A 12/12/2015   Procedure: Aortic Arch Angiography;  Surgeon: Troy Sine, MD;  Location: [place] [facility];  Service: Cardiovascular;  Laterality: N/A; . Eye surgery   HPI: 82 y.o. 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 He is admitted for sepsis secondary to PNA (RML and RLL per imaging report). Subjective: pt alert, denies any difficulties with swallowing Assessment / Plan / Recommendation CHL IP CLINICAL IMPRESSIONS 01/10/2016 Therapy Diagnosis Mild oral phase dysphagia;Suspected primary esophageal dysphagia Clinical Impression Pt demonstrates no significant oropharyngeal dysphagia worrisome for aspiration risk. There is slow bolus formation with occasional premature spillage to valleculae. Initial sips of nectar showed a delay in swallow initiation, but as study progressed pt was taking consecutive sips of thin liquids with timely swallow. Strength WNL, no penetration or aspiration occurred despite attempt to tax system. Pt did have some delayed coughing; esophageal sweep showed appearence of significant stasis with purees which did not clear with sips of liquid. Pill transited with multiple bites and sips. Discussed esophageal precautions.  Recommend regular diet and thin liquids. SLP will f/u for education and tolerance at bedside.  Impact on safety and function Moderate aspiration risk   CHL IP TREATMENT RECOMMENDATION 01/10/2016 Treatment Recommendations Therapy as outlined in treatment plan below   No flowsheet data found. CHL IP DIET RECOMMENDATION 01/10/2016 SLP Diet Recommendations Regular solids;Thin liquid Liquid Administration via Cup;Straw Medication Administration Whole meds with puree Compensations Slow rate;Small sips/bites;Follow solids with liquid Postural Changes Remain semi-upright after after feeds/meals (Comment);Seated upright at 90 degrees   CHL IP OTHER RECOMMENDATIONS 01/10/2016 Recommended Consults -- Oral Care Recommendations Patient independent with oral care Other Recommendations --   CHL IP FOLLOW UP RECOMMENDATIONS 01/10/2016 Follow up Recommendations None   CHL IP FREQUENCY AND DURATION 01/10/2016 Speech Therapy Frequency (ACUTE ONLY) min 1 x/week Treatment Duration 1 week      CHL IP ORAL PHASE 01/10/2016 Oral Phase Impaired Oral - Pudding Teaspoon -- Oral - Pudding Cup -- Oral - Honey Teaspoon -- Oral - Honey Cup -- Oral - Nectar Teaspoon -- Oral - Nectar Cup Delayed oral transit;Premature spillage Oral - Nectar Straw Delayed oral transit;Premature spillage Oral - Thin Teaspoon -- Oral -  Thin Cup Premature spillage Oral - Thin Straw Premature spillage Oral - Puree WFL Oral - Mech Soft -- Oral - Regular WFL Oral - Multi-Consistency -- Oral - Pill -- Oral Phase - Comment --  CHL IP PHARYNGEAL PHASE 01/10/2016 Pharyngeal Phase Impaired Pharyngeal- Pudding Teaspoon -- Pharyngeal -- Pharyngeal- Pudding Cup -- Pharyngeal -- Pharyngeal- Honey Teaspoon -- Pharyngeal -- Pharyngeal- Honey Cup -- Pharyngeal -- Pharyngeal- Nectar Teaspoon -- Pharyngeal -- Pharyngeal- Nectar Cup Delayed swallow initiation-vallecula Pharyngeal -- Pharyngeal- Nectar Straw Delayed swallow initiation-pyriform sinuses Pharyngeal -- Pharyngeal- Thin Teaspoon --  Pharyngeal -- Pharyngeal- Thin Cup WFL Pharyngeal -- Pharyngeal- Thin Straw WFL Pharyngeal -- Pharyngeal- Puree WFL Pharyngeal -- Pharyngeal- Mechanical Soft -- Pharyngeal -- Pharyngeal- Regular WFL Pharyngeal -- Pharyngeal- Multi-consistency -- Pharyngeal -- Pharyngeal- Pill WFL Pharyngeal -- Pharyngeal Comment --  CHL IP CERVICAL ESOPHAGEAL PHASE 01/10/2016 Cervical Esophageal Phase Impaired Pudding Teaspoon -- Pudding Cup -- Honey Teaspoon -- Honey Cup -- Nectar Teaspoon -- Nectar Cup -- Nectar Straw -- Thin Teaspoon -- Thin Cup -- Thin Straw -- Puree -- Mechanical Soft -- Regular -- Multi-consistency -- Pill -- Cervical Esophageal Comment -- CHL IP GO 12/14/2015 Functional Assessment Tool Used skilled clinical judgement Functional Limitations Motor speech Swallow Current Status [phone]) (None) Swallow Goal Status (K2706) (None) Swallow Discharge Status (C3762) (None) Motor Speech Current Status (G3151) [place] Motor Speech Goal Status (V6160) [place] Motor Speech Goal Status (V3710) [place] Spoken Language Comprehension Current Status (G2694) (None) Spoken Language Comprehension Goal Status (W5462) (None) Spoken Language Comprehension Discharge Status (V0350) (None) Spoken Language Expression Current Status (K9381) (None) Spoken Language Expression Goal Status (W2993) (None) Spoken Language Expression Discharge Status [phone]) (None) Attention Current Status (V8938) (None) Attention Goal Status (B0175) (None) Attention Discharge Status (Z0258) (None) Memory Current Status (N2778) (None) Memory Goal Status (E4235) (None) Memory Discharge Status (T6144) (None) Voice Current Status (R1540) (None) Voice Goal Status (G8676) (None) Voice Discharge Status (P9509) (None) Other Speech-Language Pathology Functional Limitation (T2671) (None) Other Speech-Language Pathology Functional Limitation Goal Status (I4580) (None) Other Speech-Language Pathology Functional Limitation Discharge Status [phone]) (None) Herbie Baltimore, MA CCC-SLP [phone]  DeBlois, Katherene Ponto 01/10/2016, 10:15 AM               Wt Readings from Last 3 Encounters:  02/05/16 181 lb (82.1 kg)  02/02/16 182 lb 6.4 oz (82.736 kg)  01/26/16 182 lb 6.4 oz (82.736 kg)    EKG: 02/05/16: NSR 87bpm RBBB LAFB LVH nonspecific ST-T changes  Physical Exam: Blood pressure 117/53, pulse 83, temperature 98.2 F (36.8 C), temperature source Axillary, resp. rate 24, height 6' (1.829 m), weight 181 lb (82.1 kg), SpO2 100 %. Body mass index is 24.54 kg/(m^2). General: Well developed, well nourished elderly AAM, in no acute distress. Head: Normocephalic, atraumatic, sclera non-icteric, no xanthomas, nares are without discharge.  Neck: JVD not significantly  elevated. Lungs: Coarse bilaterally to auscultation without wheezes, rales, or rhonchi. Frequent raspy cough. Breathing is unlabored. Heart: RRR. Occ ectopy. 2/6 SEM - not able to appreciate S2. No rubs or gallops appreciated. Abdomen: Soft, non-tender, non-distended with normoactive bowel sounds. No hepatomegaly. No rebound/guarding. No obvious abdominal masses. Msk:  Strength and tone appear normal for age. Extremities: No clubbing or cyanosis. No edema but bilateral stockings in place. Distal pedal pulses are 2+ and equal bilaterally. Neuro: Alert and oriented X 3. No facial asymmetry. No focal deficit. Moves all extremities spontaneously. Psych:  Responds to questions appropriately with a normal affect.     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 Admitted 11/2015 with + troponins with patent grafts, planned for outpatient TAVR eval. Admitted 12/2015 with HCAP and staph  bacteremia, TEE neg for endocarditis (could consider repeat in 2 weeks due to heavy AV ca++). Readmitted 02/03/2016 with weakness, cough, found to have recurrent PNA along with new RLE DVT and submassive bilateral PE along with AKI on CKD, progressive anemia, transaminitis, hypoalbuminemia. Cardiology asked to see for positive troponin and possible RHF.   1. Elevated troponin - suspect demand ischemia in the setting of multiple stressors including known severe AS. Recent cath 11/2015 was stable. Will d/w MD but do not anticipate further workup at this time.   2. Chronic diastolic CHF (now ? Combined with Systolic) - IM concerned for progression of right heart failure. Effusion noted on CT but may be related to PE. Would be cautious with diuresis given CKD. Part of appearance of fluid retention may be due to low albumin. Weight generally stable if not lower than prior admission.  3. Severe aortic stenosis - does not sound like patient was recently symptomatic with this prior to admission. Reports good progress with [facility] at [facility]. Can defer decision about TAVR referral to outpatient.  4. Submassive bilateral PE - further per IM. He was recently started on Plavix for stroke. May need to discuss with neurology whether switching to other anticoagulant in place on Plavix will suffice as stroke rx.   5. Anemia, transaminitis, AKI on CKD - per IM.  Signed, Charlie Pitter PA-C 02/05/2016, 9:13 AM Pager: [phone]   I have seen, examined and evaluated the patient this PM along with Mrs. Dunn, PA-C.  After reviewing all the available data and chart,  I agree with her findings, examination as well as impression recommendations.  Pt with very complicated history - 2 prior admissions in past 2 months --> initial plans for TAVR delayed 2/2 ? Post-cath CVA.  Had TTE in December & TEE in January to evaluated EF & AS --> normal EF with Severe AS & mod P HTN (cionfirmed by [facility]) --> now re-admitted with cough,  fever etc, c/w PNA./ Found to have Bilateral PE - on IV Heparin.  Echo today with Severely reduced LV Fnx & Severe AS along with Mod RV hypokinesis & moderately increased PA pressures.  Echo: Severely reduced LV EF 25-30%, Mod AS by velocity (more c/w Severe, ? Low output). PA Peak 57 mmHg  He has a flat trajectory Troponin elevation that can be explained by PE, however the severity of drop in EF is quite worrisome with no other explainable etiology.  Will need to treat PNA/PE. Thankfully, he denies a history of CP to suggest an ischemic etiology for EF drop. -- ABx & IV heparin   On Abx per TRH ? If transaminates are elevated 2/2 to congestive hepatopathy from PEs with existing PA HTN -- gentle diuresis (in setting of PE to avoid more hypercoagulation state. -- will need to convert to [place] for d/c.   On BB for CAD h/o. Would consider, but defer to primary cardiologist. Has not been on statin -    Leonie Man, M.D., M.S. Interventional Cardiologist   Pager # [phone]  Phone # [phone] [address] [place], [place] [address]

## 2016-02-05 NOTE — Progress Notes (Signed)
Nutrition Follow-up  DOCUMENTATION CODES:   Severe malnutrition in context of chronic illness  INTERVENTION:   -D/c Ensure -Provide Premier Protein Shakes BID, each provides 160 kcal and 30 g of protein -Provide Prostat liquid protein PO 30 ml BID with meals, each supplement provides 100 kcal, 15 grams protein. -Encourage PO intake -RD to continue to monitor  NUTRITION DIAGNOSIS:   Malnutrition related to chronic illness as evidenced by percent weight loss, energy intake < or equal to 75% for > or equal to 1 month.  Ongoing.  GOAL:   Patient will meet greater than or equal to 90% of their needs  Not meeting.  MONITOR:   PO intake, Supplement acceptance, Labs, Weight trends, I & O's    ASSESSMENT:   82 y.o. male with the past medical history of hypertension, coronary artery disease, is oxygen dependent COPD, chronic kidney disease, severe aortic stenosis, recent stay in the hospital for Staphylococcus aureus bacteremia and healthcare associated pneumonia, presents from his skilled nursing facility with the complaints of shortness of breath, cough and fever..  Patient in room asleep with daughter at bedside. Per daughter, pt does not eat much at one time and may have a few spoonfuls of something then he doesn't want more. Pt very particular about what he eats and needs a lot of encouragement to eat. Pt's daughter denies that he doesn't like hospital food. She states that family has offered pt food from outside of the hospital but he declines. UBW is 214 lb. Pt c/o taste changes. Pt's daughter concerned about carbohydrate amount in Ensure [place], would like other protein options. RD to order Premier Protein and Prostat for patient. She states he has had Prostat before.   Pt with mild-moderate muscle depletion.  Labs reviewed: CBGs: 151-261 Mg WNL  Diet Order:  DIET DYS 2 Room service appropriate?: Yes; Fluid consistency:: Thin  Skin:  Reviewed, no issues  Last BM:   2/12  Height:   Ht Readings from Last 1 Encounters:  02/05/16 6' (1.829 m)    Weight:   Wt Readings from Last 1 Encounters:  02/05/16 181 lb (82.1 kg)    Ideal Body Weight:  80.9 kg  BMI:  Body mass index is 24.54 kg/(m^2).  Estimated Nutritional Needs:   Kcal:  2000-2200  Protein:  95-105g  Fluid:  2-2.2L/day  EDUCATION NEEDS:   No education needs identified at this time  Clayton Bibles, MS, RD, LDN Pager: [phone] After Hours Pager: [phone]

## 2016-02-05 NOTE — Progress Notes (Signed)
[place] for IV heparin Indication: VTE Treatment  Allergies  Allergen Reactions  . Mold Extract [Trichophyton] Anaphylaxis  . Lipitor [Atorvastatin] Other (See Comments)    Weakness in legs/myalgias    Patient Measurements: Weight: 181 lb (82.1 kg) Heparin Dosing Weight: 82.7kg  Vital Signs: Temp: 98.2 F (36.8 C) (02/13 0509) BP: 117/53 mmHg (02/13 0500) Pulse Rate: 83 (02/13 0500)  Labs:  Recent Labs  02/03/16 1310 02/03/16 1923 02/04/16 0416 02/04/16 1435 02/04/16 1931 02/05/16 0150  HGB 9.1*  --  8.1*  --   --  7.6*  HCT 29.2*  --  25.9*  --   --  24.7*  PLT 318  --  229  --   --  261  APTT  --  33  --   --   --   --   LABPROT  --  18.8*  --   --   --   --   INR  --  1.57*  --   --   --   --   HEPARINUNFRC  --   --   --   --  0.43 0.67  CREATININE 1.54*  --  1.40*  --   --  1.39*  TROPONINI  --   --   --  1.05* 1.04* 0.96*    Estimated Creatinine Clearance: 42.6 mL/min (by C-G formula based on Cr of 1.39).   Assessment: 61 yoM with COPD on home 02, CAD, CKD, HTN, severe aortic stenosis and recent hospitalization for staph bacteremia and HCAP presents 2/11 with SOB and cough found to have recurrent right-sided PNA and probably parapneumonic effusion with elevated LA and tachycardia, started on antibiotics.  Dopplers now positive for DVT in peroneal veins, left distal popliteal vein, left soleal veins, and left peroneal veins.  VQ scan reveals BL PE (categorized as at least submassive with R heart strain noted). Pharmacy consulted to start IV heparin for VTE treatment.   PTA on plavix (?not aspirin) though pt reports not taking medication.  Resumed inpatient.    Today, 02/05/2016:.  Heparin level therapeutic (at upper end goal)  CBC: Hgb decreasing (now < 8), pltc WNL  CTA 2/12 revealed BL PE with evidence of R heart strain  Troponin elevated  SCr elevated mildly - no change  LFTs trending up     Goal of  Therapy:  Heparin level 0.3-0.7 units/ml Monitor platelets by anticoagulation protocol: Yes   Plan:   Due to rise in heparin level and now at upper end goal (with decrease in Hgb), make small adjustment in rate to 1250 units/hr and recheck heparin level later this afternoon  Daily heparin level and CBC  Doreene Eland, PharmD, BCPS.   Pager: [phone] 02/05/2016 7:43 AM

## 2016-02-05 NOTE — Progress Notes (Signed)
[facility] PROGRESS NOTE  Ashutosh Sirignano [id number] DOB: September 05, 1928 DOA: 02/03/2016 PCP: Limmie Patricia, MD  Assessment/Plan: #1 acute on chronic respiratory failure with hypoxia Secondary to bilateral submassive PE and multifocal pneumonia noted on chest x-ray and right probable parapneumonic effusion. Patient with less use of accessory muscles of respiration. Patient coughing. Patient currently afebrile. Leukocytosis trending down. Sputum Gram stain and culture pending. Blood cultures pending. Urine cultures pending. Check a urine Legionella antigen. Check a urine pneumococcus antigen. Continue empiric IV vancomycin and IV cefepime. Add IV azithromycin. Add Mucinex. Discontinued duo nebs and placed on Atrovent and Xopenex nebulizers as patient is tachycardic. Continue Pulmicort and Brovana, Claritin, Flonase. If worsening may consider BiPAP as patient is a DO NOT RESUSCITATE. PCCM ff.  #2 recurrent healthcare associated pneumonia Patient recently discharged on 01/16/2016 with a healthcare associated pneumonia and a staph bacteremia presenting back with multilobar pneumonia. Sputum Gram stain and cultures pending. Blood cultures pending. Check a urine Legionella antigen. Check a urine pneumococcus antigen. Patient had an influenza PCR done during prior hospitalization and as such will not repeated. Patient influenza panel was negative during last hospitalization and as such will not repeat. RSV ordered per PCCM. Continue empiric IV vancomycin and IV cefepime, IV azithromycin, Mucinex, nebs. CT chest consistent with bilateral submassive PE with evidence of right heart strain. Scattered right lung consolidation and mild left basilar opacity question atelectasis/infarct changes versus pneumonia. I will see panel ordered per critical care medicine. Incentive spirometry. Flutter valve. Follow.  #3 bilateral submassive PE/bilateral lower extremity DVT Per CT chest: Patient states has had  multiple colonoscopies for follow-up on polyps.                                      Right ventricular strain. Troponins are elevated however seem to have plateaued. 2-D echo is pending. Continue empiric IV heparin.     #4 probable right heart failure secondary to bilateral submassive PE Patient noted to have lower extremity swelling. Lower extremity Dopplers positive for bilateral PE. 2-D echo pending. Continue IV heparin. Continue Lasix/diureses, Supportive care. Follow.                                                               #5 right pleural effusion/probable parapneumonic effusion Likely secondary to problem #2. Patient has been pancultured.  Continue empiric Vancomycin, IV cefepime, IV azithromycin. PCCM ff. Spoke with IR who recommended CT chest first to further evaluate effusion. Per pulmonary.  #6 COPD Patient on chronic home O2. Discontinued duo nebs. Continue Xopenex and Atrovent nebulizers and Pulmicort and Brovana. Follow.  #7 diabetes mellitus type 2 Hemoglobin A1c was 6.8 on 12/14/2015. CBGs have ranged from 181-343. Increase home dose Lantus to 28 units daily. Sliding scale insulin.  #8 acute on chronic kidney disease stage II In the setting of ARB/HCTZ and probable right heart failure secondary to PE. ARB HCTZ on hold. Saline lock IV fluids. Continue IV diuretics and follow.   #9 recent staph aureus bacteremia Noted during last hospitalization. Patient underwent a workup including TEE which was negative for vegetations. Patient was discharged on IV vancomycin through peripheral IV and completed course of antibiotic treatment. Patient declined a PICC  line at that time. Repeat cultures were negative prior to discharge. Blood cultures pending at this time.  #10 history of recent stroke December 2016 AFTER cardiac catheterization. Patient with right hemiparesis. Patient was in inpatient rehabilitation unit. Continue Plavix for secondary stroke prevention.  #11  hypertension Toprol.  #12 history of coronary artery disease/aortic stenosis/diastolic dysfunction 2-D echo with EF of 50% with grade 2 diastolic dysfunction. Patient with murmur noted on exam. Saline lock IV fluids. Continue IV Lasix. Discontinue statin secondary to transaminitis. Continue beta blocker. Per cardiology.  #13 elevated troponins Likely secondary to bilateral submassive PE with right ventricular strain in the setting of sepsis. 2-D echo pending.  #14 bilateral lower extremity DVTs  Per Dopplers. Questionable etiology. Patient states has had multiple colonoscopies which were only positive for polyps. Continue IV heparin.  #15 transaminitis Likely secondary to congestion from right heart failure. Follow with diuresis. If no improvement will need to check an acute hepatitis panel, and get GI involvement.  #16 prophylaxis PPI for GI prophylaxis. Heparin for DVT prophylaxis.  Code Status: DO NOT RESUSCITATE Family Communication: Updated patient. No family present. Disposition Plan: Remain in the step down unit.   Consultants:  PCCM; Dr Ashok Cordia 02/03/2016  Cardiology: 02/05/2016  Procedures:  Chest x-ray 02/03/2016  Lower extremity Dopplers 02/04/2016  CT angiogram chest 02/04/2016   2-D echo 02/04/2016  Antibiotics:  IV vancomycin 02/03/2016  IV cefepime 02/03/2016  HPI/Subjective: Patient with cough in the room however speaking in full sentences. Patient denies any chest pain. Patient states significant improvement wit SOB since yesterday. Couldn't tolerate BIPAP yesterday on hjigh flow oxygen. No bleeding  Objective: Filed Vitals:   02/05/16 0500 02/05/16 0509  BP: 117/53   Pulse: 83   Temp:  98.2 F (36.8 C)  Resp: 24     Intake/Output Summary (Last 24 hours) at 02/05/16 1007 Last data filed at 02/05/16 0645  Gross per 24 hour  Intake 2487.52 ml  Output   1650 ml  Net 837.52 ml   Filed Weights   02/04/16 0400 02/05/16 0655  Weight: 82.7 kg  (182 lb 5.1 oz) 82.1 kg (181 lb)    Exam:   General:  Less use of accessory muscles of respiration. However speaking in full sentences.  Cardiovascular: RRR with 3/6 systolic ejection murmur  Respiratory: Some coarse breath sounds right greater than left. No crackles.  Abdomen: Soft, nontender, nondistended, positive bowel sounds  Musculoskeletal: No clubbing no cyanosis 2+ bilateral lower extremity edema.  Data Reviewed: Basic Metabolic Panel:  Recent Labs Lab 02/03/16 1310 02/04/16 0416 02/05/16 0150  NA 135 137 136  K 5.2* 5.0 4.2  CL 103 109 108  CO2 19* 17* 18*  GLUCOSE 190* 229* 206*  BUN 21* 24* 25*  CREATININE 1.54* 1.40* 1.39*  CALCIUM 8.6* 7.8* 7.7*  MG  --   --  2.2   Liver Function Tests:  Recent Labs Lab 02/03/16 1310 02/04/16 0416 02/05/16 0150  AST 46* 129* 687*  ALT 39 70* 336*  ALKPHOS 138* 143* 267*  BILITOT 1.2 0.9 0.6  PROT 8.1 6.8 6.5  ALBUMIN 2.9* 2.3* 2.2*   No results for input(s): LIPASE, AMYLASE in the last 168 hours. No results for input(s): AMMONIA in the last 168 hours. CBC:  Recent Labs Lab 02/03/16 1310 02/04/16 0416 02/05/16 0150  WBC 18.5* 13.1* 9.5  NEUTROABS 16.9*  --  7.8*  HGB 9.1* 8.1* 7.6*  HCT 29.2* 25.9* 24.7*  MCV 89.3 89.3 89.5  PLT 318  229 261   Cardiac Enzymes:  Recent Labs Lab 02/04/16 1435 02/04/16 1931 02/05/16 0150  TROPONINI 1.05* 1.04* 0.96*   BNP (last 3 results)  Recent Labs  01/09/16 0704 02/04/16 1435  BNP 338.3* 1553.6*    ProBNP (last 3 results) No results for input(s): PROBNP in the last 8760 hours.  CBG:  Recent Labs Lab 02/04/16 0752 02/04/16 1210 02/04/16 1227 02/04/16 1622 02/04/16 2155  GLUCAP 213* 171* 181* 244* 343*    Recent Results (from the past 240 hour(s))  Culture, blood (routine x 2)     Status: None (Preliminary result)   Collection Time: 02/03/16  1:10 PM  Result Value Ref Range Status   Specimen Description BLOOD BLOOD LEFT FOREARM  Final    Special Requests BOTTLES DRAWN AEROBIC AND ANAEROBIC 5CC  Final   Culture   Final    NO GROWTH < 24 HOURS Performed at [facility]    Report Status PENDING  Incomplete  Culture, blood (routine x 2)     Status: None (Preliminary result)   Collection Time: 02/03/16  1:12 PM  Result Value Ref Range Status   Specimen Description BLOOD BLOOD LEFT FOREARM  Final   Special Requests IN PEDIATRIC BOTTLE 3CC  Final   Culture   Final    NO GROWTH < 24 HOURS Performed at [facility]    Report Status PENDING  Incomplete  MRSA PCR Screening     Status: None   Collection Time: 02/03/16  6:53 PM  Result Value Ref Range Status   MRSA by PCR NEGATIVE NEGATIVE Final    Comment:        The GeneXpert MRSA Assay (FDA approved for NASAL specimens only), is one component of a comprehensive MRSA colonization surveillance program. It is not intended to diagnose MRSA infection nor to guide or monitor treatment for MRSA infections.      Studies: Dg Chest 2 View  02/03/2016  CLINICAL DATA:  82 year old nursing home patient presenting with acute onset of cough, fever, shortness of breath and chest pain which began last night. EXAM: CHEST  2 VIEW COMPARISON:  01/09/2016 and earlier. FINDINGS: AP semi-erect and lateral images were obtained. Prior sternotomy for CABG. Cardiac silhouette markedly enlarged, unchanged. Thoracic aorta atherosclerotic and tortuous, unchanged. Hilar and mediastinal contours otherwise unremarkable. Dense airspace consolidation in the right upper lobe, right lower lobe and right middle lobe associated with a moderate-sized right pleural effusion. Minimal patchy airspace opacities in the left lower lobe. Mild pulmonary venous hypertension without overt edema. IMPRESSION: Acute pneumonia involving the right upper lobe, right middle lobe, right lower lobe and to a lesser degree the left lower lobe. Moderate size right parapneumonic effusion. Electronically Signed   By: Evangeline Dakin M.D.   On: 02/03/2016 14:03   Ct Angio Chest Pe W/cm &/or Wo Cm  02/04/2016  CLINICAL DATA:  82 year old male with chest pain, shortness of breath and lower extremity DVT. EXAM: CT ANGIOGRAPHY CHEST WITH CONTRAST TECHNIQUE: Multidetector CT imaging of the chest was performed using the standard protocol during bolus administration of intravenous contrast. Multiplanar CT image reconstructions and MIPs were obtained to evaluate the vascular anatomy. CONTRAST:  136mL OMNIPAQUE IOHEXOL 350 MG/ML SOLN COMPARISON:  02/03/2016 and prior radiographs. 10/24/2014 noncontrast chest CT. FINDINGS: This is a technically satisfactory study. Mediastinum/Nodes: Pulmonary emboli are identified within the distal main right pulmonary artery, right upper and lower lobes and left lower lobe. RV/LV ratio is 1.1, compatible with right heart strain.  Cardiomegaly, coronary disease and cardiac surgical changes noted. There is no evidence of thoracic aortic aneurysm. No pericardial effusion or enlarged lymph nodes identified. Lungs/Pleura: A moderate right pleural effusion is identified. Focal consolidation within the right upper lobe and right lower lobe noted and may represent atelectasis/ infarct changes and/or infection. Moderate centrilobular and paraseptal emphysema noted. Patchy left lower lobe basilar opacity probably represents atelectasis/infarct changes. There is no evidence of pneumothorax. Upper abdomen: No acute abnormalities. Musculoskeletal: No acute or suspicious abnormalities. Review of the MIP images confirms the above findings. IMPRESSION: Bilateral pulmonary emboli with CT evidence of right heart strain (RV/LV Ratio = 1.1) consistent with at least submassive (intermediate risk) PE. The presence of right heart strain has been associated with an increased risk of morbidity and mortality. Please activate Code PE by paging [phone]. Scattered right lung consolidation and mild left basilar opacity - question  atelectasis/infarct changes and/ or pneumonia. Moderate right pleural effusion. Emphysema. Critical Value/emergent results were called by telephone at the time of interpretation on 02/04/2016 at 12:30 pm to [name], RN, who verbally acknowledged these results. Electronically Signed   By: Margarette Canada M.D.   On: 02/04/2016 12:31    Scheduled Meds: . arformoterol  15 mcg Nebulization BID  . azithromycin  500 mg Intravenous Daily  . budesonide (PULMICORT) nebulizer solution  0.25 mg Nebulization BID  . ceFEPime (MAXIPIME) IV  2 g Intravenous Q24H  . clopidogrel  75 mg Oral Daily  . docusate sodium  100 mg Oral BID  . feeding supplement (ENSURE ENLIVE)  237 mL Oral BID BM  . ferrous sulfate  325 mg Oral BID WC  . fluticasone  2 spray Each Nare Daily  . furosemide  40 mg Intravenous Q12H  . guaiFENesin  1,200 mg Oral BID  . insulin aspart  0-15 Units Subcutaneous TID WC  . insulin glargine  25 Units Subcutaneous Daily  . ipratropium  0.5 mg Nebulization Q6H  . latanoprost  1 drop Both Eyes QHS  . levalbuterol  0.63 mg Nebulization Q6H  . loratadine  10 mg Oral Daily  . metoprolol succinate  25 mg Oral Daily  . oxybutynin  5 mg Oral Daily  . pantoprazole  40 mg Oral Q0600  . sodium chloride flush  3 mL Intravenous Q12H  . vancomycin  1,250 mg Intravenous Q24H   Continuous Infusions: . heparin 1,250 Units/hr (02/05/16 0935)    Principal Problem:   Acute respiratory failure with hypoxia (HCC) Active Problems:   HCAP (healthcare-associated pneumonia)   Bilateral pulmonary embolism (HCC)   Pleural effusion on right   DVT of lower extremity, bilateral (HCC)   CAD (coronary artery disease) of artery bypass graft   Hyperlipidemia with target LDL less than 70   Essential hypertension   DM2 (diabetes mellitus, type 2) (HCC)   Aortic valve stenosis   COPD, moderate (HCC)   Acute kidney failure (HCC)   Normocytic anemia   GERD (gastroesophageal reflux disease)   Sepsis (HCC)   Stroke  (cerebrum) (HCC)   Leg DVT (deep venous thromboembolism), acute (HCC)   Chronic diastolic CHF (congestive heart failure) ([place])   Transaminitis    Time spent: 40 mins    [facility] MD [facility] Pager [phone]. If 7PM-7AM, please contact night-coverage at [email or web address], password [email or web address] 02/05/2016, 10:07 AM  LOS: 2 days

## 2016-02-05 NOTE — Care Management Note (Signed)
Case Management Note  Patient Details  Name: Federico Scharrer MRN: [id number] Date of Birth: 06/04/1928  Subjective/Objective:        Bilateral pe and dvt            Action/Plan:Date: February 05, 2016 Chart reviewed for concurrent status and case management needs. Will continue to follow patient for changes and needs: Velva Harman, BSN, RN, [place]   [phone]   Expected Discharge Date:                  Expected Discharge Plan:  Home/Self Care  In-House Referral:  NA  Discharge planning Services  CM Consult  Post Acute Care Choice:  NA Choice offered to:  NA  DME Arranged:    DME Agency:     HH Arranged:    [place] Agency:     Status of Service:  Completed, signed off  Medicare Important Message Given:    Date Medicare IM Given:    Medicare IM give by:    Date Additional Medicare IM Given:    Additional Medicare Important Message give by:     If discussed at [place] of Stay Meetings, dates discussed:    Additional Comments:  Leeroy Cha, RN 02/05/2016, 12:03 PM

## 2016-02-05 NOTE — Progress Notes (Signed)
*  PRELIMINARY RESULTS* Echocardiogram 2D Echocardiogram has been performed.  Leavy Cella 02/05/2016, 1:49 PM

## 2016-02-05 NOTE — Progress Notes (Signed)
Name: Douglas Carrillo MRN: [id number] DOB: [birth year]    ADMISSION DATE:  02/03/2016 CONSULTATION DATE:  02/04/2016  REFERRING MD :  Irine Seal, M.D. / Hospitalist  CHIEF COMPLAINT:  Multi-Focal Pneumonia w/ Pleural Effusion  BRIEF PATIENT DESCRIPTION: 82 year old male with known history of COPD and OSA presenting reporting that he was treated for pneumonia in January. Patient reports he initially felt well but then had recurrence of symptoms starting approximately 2 weeks ago.  SIGNIFICANT EVENTS  2/12 - Admit  STUDIES:  CXR PA/LAT 2/11:  Worsening of right lung opacities compared with CXR from January 2017. Right pleural effusion appreciated. Some opacity in his left lower lobe.  Venous Duplex 2/12:  Preliminary finding of DVT in R peroneal veins as well as L distal popliteal, soleal, & peroneal veins. CTA Chest 2/12:  Bilateral PE w/ RV/LV ratio 1.1. Scattered lung consolidation could represent infarct or infection. Moderate right pleural effusion. Emphysema noted. No pneumothorax. No pericardial effusion. TTE 2/13>>>  MICROBIOLOGY: Blood Ctx x2 2/11>>> MRSA PCR 2/11>>> Influenza PCR 1/17:  Negative Respiratory Viral Panel PCR 1/17:  Negative  ANTIBIOTICS: Cefepime 2/11>>> Vancomycin 2/11>>>  LINES/TUBES: PIV x2  SUBJECTIVE: Patient found to have PE yesterday & was started on therapeutic anticoagulation with heparin drip. Patient denies any chest pain or pressure. Denies any dyspnea at rest. Reports his cough remains nonproductive & improving. Patient cannot recall his last colonoscopy but has had polyps in the past. He denies any long periods of immobility or travel.  REVIEW OF SYSTEMS: No nausea or emesis. No abdominal pain. No hematochezia or hematuria. No headache or near syncope.  VITAL SIGNS: Temp:  [97.3 F (36.3 C)-98.7 F (37.1 C)] 98.2 F (36.8 C) (02/13 0509) Pulse Rate:  [82-112] 83 (02/13 0500) Resp:  [22-40] 24 (02/13 0500) BP: (107-141)/(49-102)  117/53 mmHg (02/13 0500) SpO2:  [94 %-100 %] 100 % (02/13 0500) FiO2 (%):  [40 %] 40 % (02/12 1342) Weight:  [82.1 kg (181 lb)] 82.1 kg (181 lb) (02/13 0655)  PHYSICAL EXAMINATION: General:  Awake. Alert. Laying in bed. Eyes closed until I enter.  Integument:  Warm & dry. No rash on exposed skin.  Lymphatics:  No appreciated cervical or supraclavicular lymphadenoapthy. HEENT:  Moist mucus membranes. No oral ulcers. No scleral injection. Cardiovascular:  Regular rate. Normal S1 & S2. No appreciable JVD.  Pulmonary:  Clear bilaterally to auscultation. Normal work of breathing on [place] oxygen.  Abdomen: Soft. Normal bowel sounds. Nondistended. Grossly nontender.   Recent Labs Lab 02/03/16 1310 02/04/16 0416 02/05/16 0150  NA 135 137 136  K 5.2* 5.0 4.2  CL 103 109 108  CO2 19* 17* 18*  BUN 21* 24* 25*  CREATININE 1.54* 1.40* 1.39*  GLUCOSE 190* 229* 206*    Recent Labs Lab 02/03/16 1310 02/04/16 0416 02/05/16 0150  HGB 9.1* 8.1* 7.6*  HCT 29.2* 25.9* 24.7*  WBC 18.5* 13.1* 9.5  PLT 318 229 261   Dg Chest 2 View  02/03/2016  CLINICAL DATA:  82 year old nursing home patient presenting with acute onset of cough, fever, shortness of breath and chest pain which began last night. EXAM: CHEST  2 VIEW COMPARISON:  01/09/2016 and earlier. FINDINGS: AP semi-erect and lateral images were obtained. Prior sternotomy for CABG. Cardiac silhouette markedly enlarged, unchanged. Thoracic aorta atherosclerotic and tortuous, unchanged. Hilar and mediastinal contours otherwise unremarkable. Dense airspace consolidation in the right upper lobe, right lower lobe and right middle lobe associated with a moderate-sized right pleural effusion. Minimal patchy airspace  opacities in the left lower lobe. Mild pulmonary venous hypertension without overt edema. IMPRESSION: Acute pneumonia involving the right upper lobe, right middle lobe, right lower lobe and to a lesser degree the left lower lobe. Moderate size  right parapneumonic effusion. Electronically Signed   By: Evangeline Dakin M.D.   On: 02/03/2016 14:03   Ct Angio Chest Pe W/cm &/or Wo Cm  02/04/2016  CLINICAL DATA:  82 year old male with chest pain, shortness of breath and lower extremity DVT. EXAM: CT ANGIOGRAPHY CHEST WITH CONTRAST TECHNIQUE: Multidetector CT imaging of the chest was performed using the standard protocol during bolus administration of intravenous contrast. Multiplanar CT image reconstructions and MIPs were obtained to evaluate the vascular anatomy. CONTRAST:  148mL OMNIPAQUE IOHEXOL 350 MG/ML SOLN COMPARISON:  02/03/2016 and prior radiographs. 10/24/2014 noncontrast chest CT. FINDINGS: This is a technically satisfactory study. Mediastinum/Nodes: Pulmonary emboli are identified within the distal main right pulmonary artery, right upper and lower lobes and left lower lobe. RV/LV ratio is 1.1, compatible with right heart strain. Cardiomegaly, coronary disease and cardiac surgical changes noted. There is no evidence of thoracic aortic aneurysm. No pericardial effusion or enlarged lymph nodes identified. Lungs/Pleura: A moderate right pleural effusion is identified. Focal consolidation within the right upper lobe and right lower lobe noted and may represent atelectasis/ infarct changes and/or infection. Moderate centrilobular and paraseptal emphysema noted. Patchy left lower lobe basilar opacity probably represents atelectasis/infarct changes. There is no evidence of pneumothorax. Upper abdomen: No acute abnormalities. Musculoskeletal: No acute or suspicious abnormalities. Review of the MIP images confirms the above findings. IMPRESSION: Bilateral pulmonary emboli with CT evidence of right heart strain (RV/LV Ratio = 1.1) consistent with at least submassive (intermediate risk) PE. The presence of right heart strain has been associated with an increased risk of morbidity and mortality. Please activate Code PE by paging [phone]. Scattered  right lung consolidation and mild left basilar opacity - question atelectasis/infarct changes and/ or pneumonia. Moderate right pleural effusion. Emphysema. Critical Value/emergent results were called by telephone at the time of interpretation on 02/04/2016 at 12:30 pm to [name], RN, who verbally acknowledged these results. Electronically Signed   By: Margarette Canada M.D.   On: 02/04/2016 12:31    ASSESSMENT / PLAN:  82 year old male with bilateral lower extremity DVT and bilateral pulmonary emboli with lung infarction. Question possible underlying infection given elevated procalcionin. Patient has no apparent risk factors for his clots so suspect possible underlying malignancy. Effusion is unlikely to be parapneumonic. I am sending additional infectious workup at this time.  1. Possible healthcare associated pneumonia: Currently on day #3 of Vancomycin & cefepime. Checking respiratory viral panel PCR, urine streptococcal antigen, & urine legionella antigen.  2. Bilateral pulmonary emboli/DVT: Started on heparin drip per pharmacy protocol by primary service. TTE pending. Question possible underlying occult malignancy. Defer to primary service on further workup/imaging. 3. Right Pleural Effusion:  Likely secondary to PE & Lung infarction. Could be parapneumonic. Could consider thoracentesis if this is of significant concern. 4. Acute hypoxic respiratory failure: Likely secondary to bilateral pulmonary emboli & lung infarct. Continue to wean FiO2 for Sat >92%. 5. OSA:  Continue nocturnal oxygen therapy.  [name] Ashok Cordia, M.D. [facility] Pulmonary & Critical Care Pager:  [phone] After 3pm or if no response, call [phone] 02/05/2016, 8:44 AM

## 2016-02-05 NOTE — Progress Notes (Signed)
ANTICOAGULATION CONSULT NOTE   Pharmacy Consult for IV heparin Indication: VTE Treatment  Assessment: See full assessment earlier today by Doreene Eland, RPh for details.   Pharmacy is consulted to dose IV heparin for VTE treatment.   Evening labs:  Heparin level 0.48, therapeutic  Noted previous Hgb level decreasing.  No bleeding or complications reported.  No IV site issues.    Goal of Therapy:  Heparin level 0.3-0.7 units/ml Monitor platelets by anticoagulation protocol: Yes   Plan:   Continue heparin IV infusion at 1250 units/hr  Recheck Heparin level in 8 hours to confirm level.  Due to patient being a very difficult lab stick, I will defer the next heparin level to 0500 instead of 0300.  Daily heparin level and CBC.   Gretta Arab PharmD, BCPS Pager [phone] 02/05/2016 4:58 PM

## 2016-02-06 DIAGNOSIS — D62 Acute posthemorrhagic anemia: Secondary | ICD-10-CM

## 2016-02-06 DIAGNOSIS — I25708 Atherosclerosis of coronary artery bypass graft(s), unspecified, with other forms of angina pectoris: Secondary | ICD-10-CM

## 2016-02-06 DIAGNOSIS — E785 Hyperlipidemia, unspecified: Secondary | ICD-10-CM

## 2016-02-06 DIAGNOSIS — I10 Essential (primary) hypertension: Secondary | ICD-10-CM

## 2016-02-06 DIAGNOSIS — I428 Other cardiomyopathies: Secondary | ICD-10-CM

## 2016-02-06 DIAGNOSIS — D649 Anemia, unspecified: Secondary | ICD-10-CM

## 2016-02-06 DIAGNOSIS — N179 Acute kidney failure, unspecified: Secondary | ICD-10-CM

## 2016-02-06 LAB — HEPATIC FUNCTION PANEL
ALBUMIN: 2.3 g/dL — AB (ref 3.5–5.0)
ALT: 290 U/L — ABNORMAL HIGH (ref 17–63)
AST: 291 U/L — AB (ref 15–41)
Alkaline Phosphatase: 335 U/L — ABNORMAL HIGH (ref 38–126)
BILIRUBIN TOTAL: 0.5 mg/dL (ref 0.3–1.2)
Bilirubin, Direct: 0.2 mg/dL (ref 0.1–0.5)
Indirect Bilirubin: 0.3 mg/dL (ref 0.3–0.9)
TOTAL PROTEIN: 6.7 g/dL (ref 6.5–8.1)

## 2016-02-06 LAB — PREPARE RBC (CROSSMATCH)

## 2016-02-06 LAB — CBC
HCT: 24.2 % — ABNORMAL LOW (ref 39.0–52.0)
Hemoglobin: 7.4 g/dL — ABNORMAL LOW (ref 13.0–17.0)
MCH: 27.6 pg (ref 26.0–34.0)
MCHC: 30.6 g/dL (ref 30.0–36.0)
MCV: 90.3 fL (ref 78.0–100.0)
Platelets: 283 10*3/uL (ref 150–400)
RBC: 2.68 MIL/uL — ABNORMAL LOW (ref 4.22–5.81)
RDW: 21.3 % — AB (ref 11.5–15.5)
WBC: 7.2 10*3/uL (ref 4.0–10.5)

## 2016-02-06 LAB — LEGIONELLA ANTIGEN, URINE

## 2016-02-06 LAB — GLUCOSE, CAPILLARY
GLUCOSE-CAPILLARY: 153 mg/dL — AB (ref 65–99)
GLUCOSE-CAPILLARY: 232 mg/dL — AB (ref 65–99)
GLUCOSE-CAPILLARY: 69 mg/dL (ref 65–99)
GLUCOSE-CAPILLARY: 87 mg/dL (ref 65–99)
Glucose-Capillary: 141 mg/dL — ABNORMAL HIGH (ref 65–99)

## 2016-02-06 LAB — BASIC METABOLIC PANEL
ANION GAP: 9 (ref 5–15)
BUN: 31 mg/dL — ABNORMAL HIGH (ref 6–20)
CALCIUM: 8.1 mg/dL — AB (ref 8.9–10.3)
CO2: 19 mmol/L — ABNORMAL LOW (ref 22–32)
CREATININE: 1.29 mg/dL — AB (ref 0.61–1.24)
Chloride: 109 mmol/L (ref 101–111)
GFR, EST AFRICAN AMERICAN: 57 mL/min — AB (ref >60)
GFR, EST NON AFRICAN AMERICAN: 49 mL/min — AB (ref >60)
Glucose, Bld: 199 mg/dL — ABNORMAL HIGH (ref 65–99)
Potassium: 4.2 mmol/L (ref 3.5–5.1)
SODIUM: 137 mmol/L (ref 135–145)

## 2016-02-06 LAB — IRON AND TIBC
IRON: 11 ug/dL — AB (ref 45–182)
Saturation Ratios: 5 % — ABNORMAL LOW (ref 17.9–39.5)
TIBC: 224 ug/dL — AB (ref 250–450)
UIBC: 213 ug/dL

## 2016-02-06 LAB — ABO/RH: ABO/RH(D): B POS

## 2016-02-06 LAB — RETICULOCYTES
RBC.: 2.73 MIL/uL — ABNORMAL LOW (ref 4.22–5.81)
RETIC CT PCT: 1 % (ref 0.4–3.1)
Retic Count, Absolute: 27.3 10*3/uL (ref 19.0–186.0)

## 2016-02-06 LAB — BRAIN NATRIURETIC PEPTIDE: B NATRIURETIC PEPTIDE 5: 1933.8 pg/mL — AB (ref 0.0–100.0)

## 2016-02-06 LAB — FERRITIN: Ferritin: 278 ng/mL (ref 24–336)

## 2016-02-06 LAB — FOLATE: FOLATE: 13.9 ng/mL (ref >5.9)

## 2016-02-06 LAB — HEPARIN LEVEL (UNFRACTIONATED): HEPARIN UNFRACTIONATED: 0.46 [IU]/mL (ref 0.30–0.70)

## 2016-02-06 LAB — VITAMIN B12: VITAMIN B 12: 1255 pg/mL — AB (ref 180–914)

## 2016-02-06 LAB — PROCALCITONIN: PROCALCITONIN: 4.69 ng/mL

## 2016-02-06 MED ORDER — FUROSEMIDE 10 MG/ML IJ SOLN
20.0000 mg | Freq: Once | INTRAMUSCULAR | Status: AC
  Administered 2016-02-07: 20 mg via INTRAVENOUS
  Filled 2016-02-06: qty 2

## 2016-02-06 MED ORDER — SODIUM CHLORIDE 0.9 % IV SOLN
Freq: Once | INTRAVENOUS | Status: AC
  Administered 2016-02-07: via INTRAVENOUS

## 2016-02-06 MED ORDER — IPRATROPIUM BROMIDE 0.02 % IN SOLN
0.5000 mg | Freq: Three times a day (TID) | RESPIRATORY_TRACT | Status: DC
  Administered 2016-02-07 – 2016-02-09 (×9): 0.5 mg via RESPIRATORY_TRACT
  Filled 2016-02-06 (×9): qty 2.5

## 2016-02-06 MED ORDER — DIPHENHYDRAMINE HCL 25 MG PO CAPS
25.0000 mg | ORAL_CAPSULE | Freq: Once | ORAL | Status: AC
  Administered 2016-02-06: 25 mg via ORAL
  Filled 2016-02-06: qty 1

## 2016-02-06 MED ORDER — LEVALBUTEROL HCL 0.63 MG/3ML IN NEBU
0.6300 mg | INHALATION_SOLUTION | Freq: Four times a day (QID) | RESPIRATORY_TRACT | Status: DC | PRN
  Administered 2016-02-10: 0.63 mg via RESPIRATORY_TRACT
  Filled 2016-02-06 (×3): qty 3

## 2016-02-06 MED ORDER — ACETAMINOPHEN 325 MG PO TABS
650.0000 mg | ORAL_TABLET | Freq: Once | ORAL | Status: AC
  Administered 2016-02-06: 650 mg via ORAL
  Filled 2016-02-06: qty 2

## 2016-02-06 MED ORDER — IPRATROPIUM BROMIDE 0.02 % IN SOLN
0.5000 mg | Freq: Four times a day (QID) | RESPIRATORY_TRACT | Status: DC | PRN
  Administered 2016-02-10: 0.5 mg via RESPIRATORY_TRACT
  Filled 2016-02-06: qty 2.5

## 2016-02-06 MED ORDER — INSULIN GLARGINE 100 UNIT/ML ~~LOC~~ SOLN
28.0000 [IU] | Freq: Every day | SUBCUTANEOUS | Status: DC
  Administered 2016-02-07 – 2016-02-09 (×4): 28 [IU] via SUBCUTANEOUS
  Filled 2016-02-06 (×4): qty 0.28

## 2016-02-06 MED ORDER — LEVALBUTEROL HCL 0.63 MG/3ML IN NEBU
0.6300 mg | INHALATION_SOLUTION | Freq: Three times a day (TID) | RESPIRATORY_TRACT | Status: DC
  Administered 2016-02-07 – 2016-02-09 (×9): 0.63 mg via RESPIRATORY_TRACT
  Filled 2016-02-06 (×8): qty 3

## 2016-02-06 NOTE — Progress Notes (Signed)
[place] for IV heparin Indication: VTE Treatment  Allergies  Allergen Reactions  . Mold Extract [Trichophyton] Anaphylaxis  . Lipitor [Atorvastatin] Other (See Comments)    Weakness in legs/myalgias    Patient Measurements: Height: 6' (182.9 cm) Weight: 181 lb (82.1 kg) IBW/kg (Calculated) : 77.6 Heparin Dosing Weight: 82.7kg  Vital Signs: Temp: 99.9 F (37.7 C) (02/14 0400) Temp Source: Axillary (02/14 0400) BP: 132/56 mmHg (02/14 0400) Pulse Rate: 86 (02/14 0400)  Labs:  Recent Labs  02/03/16 1923 02/04/16 0416 02/04/16 1435  02/04/16 1931 02/05/16 0150 02/05/16 1800 02/06/16 0334  HGB  --  8.1*  --   --   --  7.6*  --  7.4*  HCT  --  25.9*  --   --   --  24.7*  --  24.2*  PLT  --  229  --   --   --  261  --  283  APTT 33  --   --   --   --   --   --   --   LABPROT 18.8*  --   --   --   --   --   --   --   INR 1.57*  --   --   --   --   --   --   --   HEPARINUNFRC  --   --   --   < > 0.43 0.67 0.48 0.46  CREATININE  --  1.40*  --   --   --  1.39*  --  1.29*  TROPONINI  --   --  1.05*  --  1.04* 0.96*  --   --   < > = values in this interval not displayed.  Estimated Creatinine Clearance: 46 mL/min (by C-G formula based on Cr of 1.29).   Assessment: 60 yoM with COPD on home 02, CAD, CKD, HTN, severe aortic stenosis and recent hospitalization for staph bacteremia and HCAP presents 2/11 with SOB and cough found to have recurrent right-sided PNA and probably parapneumonic effusion with elevated LA and tachycardia, started on antibiotics.  Dopplers now positive for DVT in peroneal veins, left distal popliteal vein, left soleal veins, and left peroneal veins.  VQ scan reveals BL PE (categorized as at least submassive with R heart strain noted). Pharmacy consulted to start IV heparin for VTE treatment.   PTA on plavix (?not aspirin) though pt reports not taking medication.  Resumed inpatient.    Today, 02/06/2016:.  Heparin  level therapeutic   CBC: Hgb decreasing (now < 8), pltc WNL  CTA 2/12 revealed BL PE with evidence of R heart strain  2/13 ECHO reveals: EF = 20-25% with mod AS, RV with moderately reduced systolic fx and moderate pulmonary HTN  Troponin elevated - likely demand ischemia per cardiology  SCr elevated mildly - no change  LFTs trending up - ? Related to hepatic congestion from R-sided HF d/t PE.  Statin was stopped 2/13  No bleeding noted     Goal of Therapy:  Heparin level 0.3-0.7 units/ml Monitor platelets by anticoagulation protocol: Yes   Plan:   Continue heparin at 1250 units/hr  Daily heparin level and CBC  If plan is to change to DOAC, consider apixaban d/t mild-mod renal impairment.  Doreene Eland, PharmD, BCPS.   Pager: [phone] 02/06/2016 7:21 AM

## 2016-02-06 NOTE — Progress Notes (Signed)
Pharmacist Heart Failure Core Measure Documentation  Assessment: Douglas Carrillo has an EF documented as 20-25% on 02/05/16 by ECHO.  Rationale: Heart failure patients with left ventricular systolic dysfunction (LVSD) and an EF < 40% should be prescribed an angiotensin converting enzyme inhibitor (ACEI) or angiotensin receptor blocker (ARB) at discharge unless a contraindication is documented in the medical record.  This patient is not currently on an ACEI or ARB for HF.  This note is being placed in the record in order to provide documentation that a contraindication to the use of these agents is present for this encounter.  ACE Inhibitor or Angiotensin Receptor Blocker is contraindicated (specify all that apply)  []   ACEI allergy AND ARB allergy []   Angioedema []   Moderate or severe aortic stenosis []   Hyperkalemia []   Hypotension []   Renal artery stenosis [x]   Worsening renal function, preexisting renal disease or dysfunction  Patient on ARB (hyzaar) prior to admission, held for AKI and sepsis.  Suggest resume when clinically appopriate  Doreene Eland, PharmD, BCPS.   Pager: [phone] 02/06/2016 8:53 AM

## 2016-02-06 NOTE — Progress Notes (Signed)
Name: Douglas Carrillo MRN: [id number] DOB: 02/04/1928    ADMISSION DATE:  02/03/2016 CONSULTATION DATE:  02/04/2016  REFERRING MD :  Irine Seal, M.D. / Hospitalist  CHIEF COMPLAINT:  Multi-Focal Pneumonia w/ Pleural Effusion  BRIEF PATIENT DESCRIPTION: 82 year old male with known history of COPD and OSA presenting reporting that he was treated for pneumonia in January. Patient reports he initially felt well but then had recurrence of symptoms starting approximately 2 weeks ago.  SIGNIFICANT EVENTS  2/12 - Admit  STUDIES:  CXR PA/LAT 2/11:  Worsening of right lung opacities compared with CXR from January 2017. Right pleural effusion appreciated. Some opacity in his left lower lobe.  Venous Duplex 2/12:  Preliminary finding of DVT in R peroneal veins as well as L distal popliteal, soleal, & peroneal veins. CTA Chest 2/12:  Bilateral PE w/ RV/LV ratio 1.1. Scattered lung consolidation could represent infarct or infection. Moderate right pleural effusion. Emphysema noted. No pneumothorax. No pericardial effusion. TTE 2/13:  LV w/ EF 25-30% w/ diffuse hypokinesis. RV moderately dilated with moderate reduction in systolic function.   MICROBIOLOGY: Respiratory Panel PCR 2/13>>> Urine Strep Ag 2/13:  Negative Urine Legionella Ag 2/13>>> Blood Ctx x2 2/11>>> MRSA PCR 2/11>>> Influenza PCR 1/17:  Negative Respiratory Viral Panel PCR 1/17:  Negative  ANTIBIOTICS: Cefepime 2/11>>> Vancomycin 2/11>>>  LINES/TUBES: PIV x2  SUBJECTIVE: No acute events overnight. Breathing better this morning. Still with nonproductive cough. No chest pain or pressure.  REVIEW OF SYSTEMS: No nausea or emesis. No abdominal pain. No hematochezia or hematuria. No headache or near syncope.  VITAL SIGNS: Temp:  [97.3 F (36.3 C)-99.9 F (37.7 C)] 99.9 F (37.7 C) (02/14 0400) Pulse Rate:  [25-97] 87 (02/14 0800) Resp:  [26-33] 26 (02/14 0800) BP: (115-144)/(56-83) 121/60 mmHg (02/14 0800) SpO2:  [92  %-100 %] 96 % (02/14 1048)  PHYSICAL EXAMINATION: General:  Awake. Laying in bed. Visitor at bedside. Integument:  Warm & dry. No rash on exposed skin.  Lymphatics:  No appreciated cervical or supraclavicular lymphadenoapthy. HEENT:  Moist mucus membranes. No oral ulcers. No scleral injection. Cardiovascular:  Regular rate. Normal S1 & S2. No appreciable JVD.  Pulmonary:  Clear on auscultation bilaterally. Mildly increased work of breathing on nasal cannula oxygen.  Abdomen: Soft. Normal bowel sounds. Nondistended. Grossly nontender.   Recent Labs Lab 02/04/16 0416 02/05/16 0150 02/06/16 0334  NA 137 136 137  K 5.0 4.2 4.2  CL 109 108 109  CO2 17* 18* 19*  BUN 24* 25* 31*  CREATININE 1.40* 1.39* 1.29*  GLUCOSE 229* 206* 199*    Recent Labs Lab 02/04/16 0416 02/05/16 0150 02/06/16 0334  HGB 8.1* 7.6* 7.4*  HCT 25.9* 24.7* 24.2*  WBC 13.1* 9.5 7.2  PLT 229 261 283   Ct Angio Chest Pe W/cm &/or Wo Cm  02/04/2016  CLINICAL DATA:  82 year old male with chest pain, shortness of breath and lower extremity DVT. EXAM: CT ANGIOGRAPHY CHEST WITH CONTRAST TECHNIQUE: Multidetector CT imaging of the chest was performed using the standard protocol during bolus administration of intravenous contrast. Multiplanar CT image reconstructions and MIPs were obtained to evaluate the vascular anatomy. CONTRAST:  174mL OMNIPAQUE IOHEXOL 350 MG/ML SOLN COMPARISON:  02/03/2016 and prior radiographs. 10/24/2014 noncontrast chest CT. FINDINGS: This is a technically satisfactory study. Mediastinum/Nodes: Pulmonary emboli are identified within the distal main right pulmonary artery, right upper and lower lobes and left lower lobe. RV/LV ratio is 1.1, compatible with right heart strain. Cardiomegaly, coronary disease and cardiac surgical  changes noted. There is no evidence of thoracic aortic aneurysm. No pericardial effusion or enlarged lymph nodes identified. Lungs/Pleura: A moderate right pleural effusion is  identified. Focal consolidation within the right upper lobe and right lower lobe noted and may represent atelectasis/ infarct changes and/or infection. Moderate centrilobular and paraseptal emphysema noted. Patchy left lower lobe basilar opacity probably represents atelectasis/infarct changes. There is no evidence of pneumothorax. Upper abdomen: No acute abnormalities. Musculoskeletal: No acute or suspicious abnormalities. Review of the MIP images confirms the above findings. IMPRESSION: Bilateral pulmonary emboli with CT evidence of right heart strain (RV/LV Ratio = 1.1) consistent with at least submassive (intermediate risk) PE. The presence of right heart strain has been associated with an increased risk of morbidity and mortality. Please activate Code PE by paging [phone]. Scattered right lung consolidation and mild left basilar opacity - question atelectasis/infarct changes and/ or pneumonia. Moderate right pleural effusion. Emphysema. Critical Value/emergent results were called by telephone at the time of interpretation on 02/04/2016 at 12:30 pm to [name], RN, who verbally acknowledged these results. Electronically Signed   By: Margarette Canada M.D.   On: 02/04/2016 12:31    ASSESSMENT / PLAN:  82 year old male with bilateral lower extremity DVT and bilateral pulmonary emboli with lung infarction. Patient continuing on broad-spectrum antibiotic swallow awaiting further viral an infectious studies. The patient does have evidence of cor pulmonale on his echocardiogram likely due to his pulmonary emboli. Anemia is mild and there are no signs of active bleeding. I suspect he has some element of atelectasis today.  1. Possible healthcare associated pneumonia: Currently on day #4/7 of Vancomycin & cefepime empirically. Awaiting respiratory viral panel PCR & urine Legionella antigen. Plan for a total of 7 days of empiric antibiotic. 2. Bilateral pulmonary emboli/DVT: Tolerating heparin drip. Further workup per  primary service.      3. Right Pleural Effusion:  Likely secondary to PE & Lung infarction. Could be parapneumonic. Could consider thoracentesis if this is of significant concern. 4. Acute hypoxic respiratory failure: Likely secondary to bilateral pulmonary emboli & lung infarct. Continue to wean FiO2 for Sat >92%. counseled patient on appropriate frequency of incentive spirometry.  5. OSA:  Continue nocturnal oxygen therapy.  At this time I will sign off. Please contact me if I can be of any further assistance in the care of this patient.  [name] Ashok Cordia, M.D. [facility] Pulmonary & Critical Care Pager:  [phone] After 3pm or if no response, call [phone] 02/06/2016, 11:20 AM

## 2016-02-06 NOTE — Progress Notes (Signed)
[facility] PROGRESS NOTE  Douglas Carrillo [id number] DOB: 05/30/1928 DOA: 02/03/2016 PCP: Limmie Patricia, MD  Brief interval history Douglas Carrillo is a 82 y.o. male with the past medical history of hypertension, coronary artery disease, is oxygen dependent COPD, chronic kidney disease, severe aortic stenosis, recent stay in the hospital for Staphylococcus aureus bacteremia and healthcare associated pneumonia, presents from his skilled nursing facility with the complaints of shortness of breath, cough and fever. Patient is accompanied by his daughter and granddaughter. He mentions that after he was discharged on January 24, he was feeling well. He was feeling well for the first few days but then started getting weak again. And then a few days later started developing a cough. This has been progressively getting worse. He does not bring up anything when he coughs. Has had some chest pain. Has been wheezing. Felt weak. And then he had a fever today, which prompted his arrival to the hospital. No nausea or vomiting. Occasionally, he feels as if he does choke on his food. He was seen by speech therapy during his last hospitalization.  Evaluation in the emergency department has revealed right-sided pneumonia. His lactic acid level was elevated. He was slightly tachycardic.  Patient was admitted to the stepdown unit placed empirically on IV antibiotics and pancultured. Patient had increased use of accessory muscles of respiration. Lower extremity Dopplers done was consistent with bilateral DVT. CT angiogram of the chest was done which was consistent with submassive bilateral PE with right ventricular strain. Patient was placed on IV heparin. IV fluids were discontinued and patient was diuresed with good urine output and improvement in renal function. Critical care was consulted and is following.      Assessment/Plan: #1 acute on chronic respiratory failure with hypoxia Secondary to  bilateral submassive PE and multifocal pneumonia noted on chest x-ray and right probable parapneumonic effusion. Patient with less use of accessory muscles of respiration. Patient coughing. Patient currently afebrile. Leukocytosis trending down. Sputum Gram stain and culture pending. Blood cultures pending. Urine cultures pending. Urine Legionella antigen pending. Urine pneumococcus antigen negative. Continue empiric IV cefepime, IV azithromycin, Mucinex. DC IV vancomycin. Discontinued duo nebs and placed on Atrovent and Xopenex nebulizers as patient is tachycardic. Continue Pulmicort and Brovana, Claritin, Flonase. If worsening may consider BiPAP as patient is a DO NOT RESUSCITATE. PCCM ff.  #2 recurrent healthcare associated pneumonia Patient recently discharged on 01/16/2016 with a healthcare associated pneumonia and a staph bacteremia presenting back with multilobar pneumonia. Sputum Gram stain and cultures pending. Blood cultures pending. Urine Legionella antigen pending. Urine pneumococcus antigen negative. Patient had an influenza PCR done during prior hospitalization and as such will not repeated. Patient influenza panel was negative during last hospitalization and as such will not repeat. RSV ordered per PCCM and is pending. Continue empiric IV cefepime, IV azithromycin, Mucinex, nebs. DC IV vancomycin. CT chest consistent with bilateral submassive PE with evidence of right heart strain. Scattered right lung consolidation and mild left basilar opacity question atelectasis/infarct changes versus pneumonia. Pro calcitonin level was elevated. Incentive spirometry. Flutter valve. PCCM ff.   #3 bilateral submassive PE/bilateral lower extremity DVT Per CT chest: Patient states has had multiple colonoscopies for follow-up on polyps.                                      Right ventricular strain. Troponins are elevated however seem to have plateaued. 2-D echo  with moderately reduced right ventricular systolic  function, increased right ventricular systolic pressure consistent with moderate pulmonary hypertension. Continue empiric IV heparin. If LFTs continue to improve may consider transitioning to oral Coumadin due to extensive clot burden and bilateral lower extremity edema.   #4 probable acute systolic heart failure/right heart failure secondary to bilateral submassive PE Patient noted to have lower extremity swelling. Lower extremity Dopplers positive for bilateral PE. 2-D echo with a ejection fraction of 25-30% with diffuse hypokinesis, increased RV systolic pressure  consistent with moderate pulmonary hypertension, right ventricular systolic function moderately reduced, moderate aortic stenosis. Continue IV heparin. Continue Lasix/diureses, Supportive care. Follow.                                                               #5 right pleural effusion/probable parapneumonic effusion Likely secondary to problem #2. Patient has been pancultured.  Continue empirc IV cefepime, IV azithromycin. D/C vancomycin. PCCM ff. Per pulmonary.  #6 COPD Patient on chronic home O2. Discontinued duo nebs. Continue Xopenex and Atrovent nebulizers and Pulmicort and Brovana. Follow.  #7 diabetes mellitus type 2 Hemoglobin A1c was 6.8 on 12/14/2015. CBGs have ranged from 211-238.-343. Increase home dose Lantus to 28 units daily. Sliding scale insulin.  #8 acute on chronic kidney disease stage II In the setting of ARB/HCTZ and probable right heart failure secondary to PE. ARB HCTZ on hold. Saline lock IV fluids. Renal function improving on diuretics. Follow.   #9 recent staph aureus bacteremia Noted during last hospitalization. Patient underwent a workup including TEE which was negative for vegetations. Patient was discharged on IV vancomycin through peripheral IV and completed course of antibiotic treatment. Patient declined a PICC line at that time. Repeat cultures were negative prior to discharge. Blood cultures with  no growth to date. D/C Vancomycin.  #10 history of recent stroke December 2016 AFTER cardiac catheterization. Patient with right hemiparesis. Patient was in inpatient rehabilitation unit. D/C plavix as patient on IV heparin. Patient will likely be transitioned to coumadin if LFTS improved for PE/DVT.   #11 hypertension Toprol.  #12 history of coronary artery disease/aortic stenosis/diastolic dysfunction 2-D echo with EF of 50% with grade 2 diastolic dysfunction [id number]. Repeat 2-D echo with a EF of 25-30% with diffuse hypokinesis, increased right ventricular systolic pressure consistent with moderate pulmonary hypertension, right ventricular systolic function moderately reduced, moderate aortic stenosis. Patient diuresed well and was -593 over the past 24 hours with a urine output of 1.95 L. Patient with clinical improvement. Statin discontinued secondary to transaminitis. Continue beta blocker. Strict I's and O's. Daily weights.Continue lasix 40mg  IV 12 and monitor BP closely. Per cardiology.  #13 elevated troponins Likely secondary to bilateral submassive PE with right ventricular strain in the setting of sepsis. 2-D echo with EF of 25-30% with diffuse hypokinesis, increased right ventricular systolic pressure consistent with moderate pulmonary hypertension, right ventricular systolic function moderately reduced, moderate AS. Per cardiology.   #14 bilateral lower extremity DVTs  Per Dopplers. Questionable etiology. Patient states has had multiple colonoscopies which were only positive for polyps. Continue IV heparin.  #15 transaminitis Likely secondary to congestion from right heart failure. LFTs trending down LFTs trending down with diuresis. Follow with diuresis.   #16 prophylaxis PPI for GI prophylaxis. Heparin for DVT prophylaxis.  Code  Status: DO NOT RESUSCITATE Family Communication: Updated patient. No family present. Disposition Plan: Remain in the step down  unit.   Consultants:  PCCM; Dr Ashok Cordia 02/03/2016  Cardiology: Dr Ellyn Hack 02/05/2016  Procedures:  Chest x-ray 02/03/2016  Lower extremity Dopplers 02/04/2016  CT angiogram chest 02/04/2016   2-D echo 02/04/2016  Antibiotics:  IV vancomycin 02/03/2016>>>>>02/06/2016  IV cefepime 02/03/2016  HPI/Subjective: Patient laying in bed sleeping, easily arousable. Patient states SOB improved significantly since admission. No CP. No bleeding.  Objective: Filed Vitals:   02/06/16 0400 02/06/16 0800  BP: 132/56 121/60  Pulse: 86 87  Temp: 99.9 F (37.7 C)   Resp: 26 26    Intake/Output Summary (Last 24 hours) at 02/06/16 0908 Last data filed at 02/06/16 0600  Gross per 24 hour  Intake 1356.65 ml  Output   1950 ml  Net -593.35 ml   Filed Weights   02/04/16 0400 02/05/16 0655  Weight: 82.7 kg (182 lb 5.1 oz) 82.1 kg (181 lb)    Exam:   General:  NAD on high Flow oxygen  Cardiovascular: RRR with 3/6 systolic ejection murmur  Respiratory: CTAB  Abdomen: Soft, nontender, nondistended, positive bowel sounds  Musculoskeletal: No clubbing no cyanosis 1-2+ bilateral lower extremity edema.  Data Reviewed: Basic Metabolic Panel:  Recent Labs Lab 02/03/16 1310 02/04/16 0416 02/05/16 0150 02/06/16 0334  NA 135 137 136 137  K 5.2* 5.0 4.2 4.2  CL 103 109 108 109  CO2 19* 17* 18* 19*  GLUCOSE 190* 229* 206* 199*  BUN 21* 24* 25* 31*  CREATININE 1.54* 1.40* 1.39* 1.29*  CALCIUM 8.6* 7.8* 7.7* 8.1*  MG  --   --  2.2  --    Liver Function Tests:  Recent Labs Lab 02/03/16 1310 02/04/16 0416 02/05/16 0150 02/06/16 0815  AST 46* 129* 687* 291*  ALT 39 70* 336* 290*  ALKPHOS 138* 143* 267* 335*  BILITOT 1.2 0.9 0.6 0.5  PROT 8.1 6.8 6.5 6.7  ALBUMIN 2.9* 2.3* 2.2* 2.3*   No results for input(s): LIPASE, AMYLASE in the last 168 hours. No results for input(s): AMMONIA in the last 168 hours. CBC:  Recent Labs Lab 02/03/16 1310 02/04/16 0416  02/05/16 0150 02/06/16 0334  WBC 18.5* 13.1* 9.5 7.2  NEUTROABS 16.9*  --  7.8*  --   HGB 9.1* 8.1* 7.6* 7.4*  HCT 29.2* 25.9* 24.7* 24.2*  MCV 89.3 89.3 89.5 90.3  PLT 318 229 261 283   Cardiac Enzymes:  Recent Labs Lab 02/04/16 1435 02/04/16 1931 02/05/16 0150  TROPONINI 1.05* 1.04* 0.96*   BNP (last 3 results)  Recent Labs  01/09/16 0704 02/04/16 1435  BNP 338.3* 1553.6*    ProBNP (last 3 results) No results for input(s): PROBNP in the last 8760 hours.  CBG:  Recent Labs Lab 02/04/16 2155 02/05/16 0934 02/05/16 1205 02/05/16 1613 02/05/16 2202  GLUCAP 343* 151* 261* 211* 238*    Recent Results (from the past 240 hour(s))  Culture, blood (routine x 2)     Status: None (Preliminary result)   Collection Time: 02/03/16  1:10 PM  Result Value Ref Range Status   Specimen Description BLOOD BLOOD LEFT FOREARM  Final   Special Requests BOTTLES DRAWN AEROBIC AND ANAEROBIC 5CC  Final   Culture   Final    NO GROWTH 2 DAYS Performed at [facility]    Report Status PENDING  Incomplete  Culture, blood (routine x 2)     Status: None (Preliminary result)  Collection Time: 02/03/16  1:12 PM  Result Value Ref Range Status   Specimen Description BLOOD BLOOD LEFT FOREARM  Final   Special Requests IN PEDIATRIC BOTTLE 3CC  Final   Culture   Final    NO GROWTH 2 DAYS Performed at [facility]    Report Status PENDING  Incomplete  MRSA PCR Screening     Status: None   Collection Time: 02/03/16  6:53 PM  Result Value Ref Range Status   MRSA by PCR NEGATIVE NEGATIVE Final    Comment:        The GeneXpert MRSA Assay (FDA approved for NASAL specimens only), is one component of a comprehensive MRSA colonization surveillance program. It is not intended to diagnose MRSA infection nor to guide or monitor treatment for MRSA infections.      Studies: Ct Angio Chest Pe W/cm &/or Wo Cm  02/04/2016  CLINICAL DATA:  82 year old male with chest pain,  shortness of breath and lower extremity DVT. EXAM: CT ANGIOGRAPHY CHEST WITH CONTRAST TECHNIQUE: Multidetector CT imaging of the chest was performed using the standard protocol during bolus administration of intravenous contrast. Multiplanar CT image reconstructions and MIPs were obtained to evaluate the vascular anatomy. CONTRAST:  17mL OMNIPAQUE IOHEXOL 350 MG/ML SOLN COMPARISON:  02/03/2016 and prior radiographs. 10/24/2014 noncontrast chest CT. FINDINGS: This is a technically satisfactory study. Mediastinum/Nodes: Pulmonary emboli are identified within the distal main right pulmonary artery, right upper and lower lobes and left lower lobe. RV/LV ratio is 1.1, compatible with right heart strain. Cardiomegaly, coronary disease and cardiac surgical changes noted. There is no evidence of thoracic aortic aneurysm. No pericardial effusion or enlarged lymph nodes identified. Lungs/Pleura: A moderate right pleural effusion is identified. Focal consolidation within the right upper lobe and right lower lobe noted and may represent atelectasis/ infarct changes and/or infection. Moderate centrilobular and paraseptal emphysema noted. Patchy left lower lobe basilar opacity probably represents atelectasis/infarct changes. There is no evidence of pneumothorax. Upper abdomen: No acute abnormalities. Musculoskeletal: No acute or suspicious abnormalities. Review of the MIP images confirms the above findings. IMPRESSION: Bilateral pulmonary emboli with CT evidence of right heart strain (RV/LV Ratio = 1.1) consistent with at least submassive (intermediate risk) PE. The presence of right heart strain has been associated with an increased risk of morbidity and mortality. Please activate Code PE by paging [phone]. Scattered right lung consolidation and mild left basilar opacity - question atelectasis/infarct changes and/ or pneumonia. Moderate right pleural effusion. Emphysema. Critical Value/emergent results were called by  telephone at the time of interpretation on 02/04/2016 at 12:30 pm to [name], RN, who verbally acknowledged these results. Electronically Signed   By: Margarette Canada M.D.   On: 02/04/2016 12:31    Scheduled Meds: . arformoterol  15 mcg Nebulization BID  . azithromycin  500 mg Intravenous Daily  . budesonide (PULMICORT) nebulizer solution  0.25 mg Nebulization BID  . ceFEPime (MAXIPIME) IV  2 g Intravenous Q24H  . clopidogrel  75 mg Oral Daily  . docusate sodium  100 mg Oral BID  . feeding supplement (PRO-STAT SUGAR FREE 64)  30 mL Oral BID  . ferrous sulfate  325 mg Oral BID WC  . fluticasone  2 spray Each Nare Daily  . furosemide  40 mg Intravenous Q12H  . guaiFENesin  1,200 mg Oral BID  . insulin aspart  0-15 Units Subcutaneous TID WC  . insulin glargine  25 Units Subcutaneous Daily  . ipratropium  0.5 mg Nebulization Q6H  .  latanoprost  1 drop Both Eyes QHS  . levalbuterol  0.63 mg Nebulization Q6H  . loratadine  10 mg Oral Daily  . metoprolol succinate  25 mg Oral Daily  . oxybutynin  5 mg Oral Daily  . pantoprazole  40 mg Oral Q0600  . protein supplement shake  8 oz Oral BID BM  . sodium chloride flush  3 mL Intravenous Q12H  . vancomycin  1,250 mg Intravenous Q24H   Continuous Infusions: . heparin 1,250 Units/hr (02/06/16 0800)    Principal Problem:   Acute respiratory failure with hypoxia (HCC) Active Problems:   HCAP (healthcare-associated pneumonia)   Bilateral pulmonary embolism (HCC)   Pleural effusion on right   DVT of lower extremity, bilateral (HCC)   CAD (coronary artery disease) of artery bypass graft   Hyperlipidemia with target LDL less than 70   Essential hypertension   DM2 (diabetes mellitus, type 2) (HCC)   Aortic valve stenosis   COPD, moderate (HCC)   Acute kidney failure (HCC)   Normocytic anemia   GERD (gastroesophageal reflux disease)   Sepsis (HCC)   Stroke (cerebrum) (HCC)   Leg DVT (deep venous thromboembolism), acute (HCC)   Chronic diastolic  CHF (congestive heart failure) ([place])   Transaminitis    Time spent: 40 mins    [facility] MD [facility] Pager [phone]. If 7PM-7AM, please contact night-coverage at [email or web address], password [facility] 02/06/2016, 9:08 AM  LOS: 3 days

## 2016-02-06 NOTE — Progress Notes (Signed)
Patient: Douglas Carrillo / Admit Date: 02/03/2016 / Date of Encounter: 02/06/2016, 7:42 AM   Subjective: Cough slightly improved today. SOB also improving. No CP.   Objective: Telemetry: NSR occ PVCs Physical Exam: Blood pressure 132/56, pulse 86, temperature 99.9 F (37.7 C), temperature source Axillary, resp. rate 26, height 6' (1.829 m), weight 181 lb (82.1 kg), SpO2 97 %. General: Well developed elderly AAM in no acute distress. Head: Normocephalic, atraumatic, sclera non-icteric, no xanthomas, nares are without discharge. Neck: JVP not elevated. Lungs: Clear bilaterally to auscultation without wheezes, rales, or rhonchi. Breathing is unlabored. Heart: RRR. 2/6 SEM - not able to appreciate S2. No rubs or gallops appreciated. Abdomen: Soft, non-tender, non-distended with normoactive bowel sounds. No rebound/guarding. Extremities: No clubbing or cyanosis. Trace BLE edema (stockings in place). Distal pedal pulses are 2+ and equal bilaterally. Neuro: Alert and oriented X 3. Moves all extremities spontaneously. Psych:  Responds to questions appropriately with a normal affect.   Intake/Output Summary (Last 24 hours) at 02/06/16 0742 Last data filed at 02/06/16 0600  Gross per 24 hour  Intake 1356.65 ml  Output   1950 ml  Net -593.35 ml    Inpatient Medications:  . arformoterol  15 mcg Nebulization BID  . azithromycin  500 mg Intravenous Daily  . budesonide (PULMICORT) nebulizer solution  0.25 mg Nebulization BID  . ceFEPime (MAXIPIME) IV  2 g Intravenous Q24H  . clopidogrel  75 mg Oral Daily  . docusate sodium  100 mg Oral BID  . feeding supplement (PRO-STAT SUGAR FREE 64)  30 mL Oral BID  . ferrous sulfate  325 mg Oral BID WC  . fluticasone  2 spray Each Nare Daily  . furosemide  40 mg Intravenous Q12H  . guaiFENesin  1,200 mg Oral BID  . insulin aspart  0-15 Units Subcutaneous TID WC  . insulin glargine  25 Units Subcutaneous Daily  . ipratropium  0.5 mg Nebulization Q6H    . latanoprost  1 drop Both Eyes QHS  . levalbuterol  0.63 mg Nebulization Q6H  . loratadine  10 mg Oral Daily  . metoprolol succinate  25 mg Oral Daily  . oxybutynin  5 mg Oral Daily  . pantoprazole  40 mg Oral Q0600  . protein supplement shake  8 oz Oral BID BM  . sodium chloride flush  3 mL Intravenous Q12H  . vancomycin  1,250 mg Intravenous Q24H   Infusions:  . heparin 1,250 Units/hr (02/06/16 0600)    Labs:  Recent Labs  02/05/16 0150 02/06/16 0334  NA 136 137  K 4.2 4.2  CL 108 109  CO2 18* 19*  GLUCOSE 206* 199*  BUN 25* 31*  CREATININE 1.39* 1.29*  CALCIUM 7.7* 8.1*  MG 2.2  --     Recent Labs  02/04/16 0416 02/05/16 0150  AST 129* 687*  ALT 70* 336*  ALKPHOS 143* 267*  BILITOT 0.9 0.6  PROT 6.8 6.5  ALBUMIN 2.3* 2.2*    Recent Labs  02/03/16 1310  02/05/16 0150 02/06/16 0334  WBC 18.5*  < > 9.5 7.2  NEUTROABS 16.9*  --  7.8*  --   HGB 9.1*  < > 7.6* 7.4*  HCT 29.2*  < > 24.7* 24.2*  MCV 89.3  < > 89.5 90.3  PLT 318  < > 261 283  < > = values in this interval not displayed.  Recent Labs  02/04/16 1435 02/04/16 1931 02/05/16 0150  TROPONINI 1.05* 1.04* 0.96*   Invalid input(s):  POCBNP No results for input(s): HGBA1C in the last 72 hours.   Radiology/Studies:  Dg Chest 2 View  02/03/2016  CLINICAL DATA:  82 year old nursing home patient presenting with acute onset of cough, fever, shortness of breath and chest pain which began last night. EXAM: CHEST  2 VIEW COMPARISON:  01/09/2016 and earlier. FINDINGS: AP semi-erect and lateral images were obtained. Prior sternotomy for CABG. Cardiac silhouette markedly enlarged, unchanged. Thoracic aorta atherosclerotic and tortuous, unchanged. Hilar and mediastinal contours otherwise unremarkable. Dense airspace consolidation in the right upper lobe, right lower lobe and right middle lobe associated with a moderate-sized right pleural effusion. Minimal patchy airspace opacities in the left lower lobe.  Mild pulmonary venous hypertension without overt edema. IMPRESSION: Acute pneumonia involving the right upper lobe, right middle lobe, right lower lobe and to a lesser degree the left lower lobe. Moderate size right parapneumonic effusion. Electronically Signed   By: Evangeline Dakin M.D.   On: 02/03/2016 14:03   Dg Chest 2 View  01/09/2016  CLINICAL DATA:  Cough and shortness of breath for a week. EXAM: CHEST  2 VIEW COMPARISON:  January 04, 2016. FINDINGS: Stable cardiomegaly. Status post coronary artery bypass graft. No pneumothorax or significant pleural effusion is noted. There is interval development of right middle and lower lobe airspace opacities concerning for pneumonia. Left lung is clear. Bony thorax is unremarkable. IMPRESSION: Findings consistent with right middle and lower lobe pneumonia. Continued radiographic follow-up is recommended to ensure resolution. Electronically Signed   By: Marijo Conception, M.D.   On: 01/09/2016 08:08   Ct Angio Chest Pe W/cm &/or Wo Cm  02/04/2016  CLINICAL DATA:  82 year old male with chest pain, shortness of breath and lower extremity DVT. EXAM: CT ANGIOGRAPHY CHEST WITH CONTRAST TECHNIQUE: Multidetector CT imaging of the chest was performed using the standard protocol during bolus administration of intravenous contrast. Multiplanar CT image reconstructions and MIPs were obtained to evaluate the vascular anatomy. CONTRAST:  154mL OMNIPAQUE IOHEXOL 350 MG/ML SOLN COMPARISON:  02/03/2016 and prior radiographs. 10/24/2014 noncontrast chest CT. FINDINGS: This is a technically satisfactory study. Mediastinum/Nodes: Pulmonary emboli are identified within the distal main right pulmonary artery, right upper and lower lobes and left lower lobe. RV/LV ratio is 1.1, compatible with right heart strain. Cardiomegaly, coronary disease and cardiac surgical changes noted. There is no evidence of thoracic aortic aneurysm. No pericardial effusion or enlarged lymph nodes identified.  Lungs/Pleura: A moderate right pleural effusion is identified. Focal consolidation within the right upper lobe and right lower lobe noted and may represent atelectasis/ infarct changes and/or infection. Moderate centrilobular and paraseptal emphysema noted. Patchy left lower lobe basilar opacity probably represents atelectasis/infarct changes. There is no evidence of pneumothorax. Upper abdomen: No acute abnormalities. Musculoskeletal: No acute or suspicious abnormalities. Review of the MIP images confirms the above findings. IMPRESSION: Bilateral pulmonary emboli with CT evidence of right heart strain (RV/LV Ratio = 1.1) consistent with at least submassive (intermediate risk) PE. The presence of right heart strain has been associated with an increased risk of morbidity and mortality. Please activate Code PE by paging [phone]. Scattered right lung consolidation and mild left basilar opacity - question atelectasis/infarct changes and/ or pneumonia. Moderate right pleural effusion. Emphysema. Critical Value/emergent results were called by telephone at the time of interpretation on 02/04/2016 at 12:30 pm to [name], RN, who verbally acknowledged these results. Electronically Signed   By: Margarette Canada M.D.   On: 02/04/2016 12:31   Dg Swallowing Func-speech Pathology  01/10/2016  Objective Swallowing Evaluation:   Patient Details Name: Douglas Carrillo MRN: [id number] Date of Birth: [birth year] Today's Date: 01/10/2016 Time: SLP Start Time (ACUTE ONLY): 0930-SLP Stop Time (ACUTE ONLY): 0945 SLP Time Calculation (min) (ACUTE ONLY): 15 min Past Medical History: Past Medical History Diagnosis Date . Hypertension  . High cholesterol  . Prostate cancer ([place])  . CAD (coronary artery disease)  . OSA (obstructive sleep apnea)  . COPD (chronic obstructive pulmonary disease) (HCC)    MODERATE . Diabetes mellitus ([place])  . GERD (gastroesophageal reflux disease)  . Vertigo  . Arthritis    OA . Anemia  . CHF (congestive heart failure)  (HCC)    grade 2 diastolic dysfuction . CKD (chronic kidney disease)  . Stroke (cerebrum) ([place])    11/2015 . HCAP (healthcare-associated pneumonia)    12/2015 Past Surgical History: Past Surgical History Procedure Laterality Date . Coronary artery bypass graft  03/29/1999   x 4:  LIMA to the LAD,SVG to the diagonal branch of the LAD,SVG to the intermediate coronary artery & SVG to the posterior descending branch of the RCA. . Colon surgery     x 4 . Nm myocar perf wall motion  04/28/2012   Low Risk Scan . Prostate surgery   . Cardiac catheterization   . Cardiac catheterization N/A 12/12/2015   Procedure: Right/Left Heart Cath and Coronary/Graft Angiography;  Surgeon: Troy Sine, MD;  Location: [facility];  Service: Cardiovascular;  Laterality: N/A; . Peripheral vascular catheterization N/A 12/12/2015   Procedure: Aortic Arch Angiography;  Surgeon: Troy Sine, MD;  Location: [facility];  Service: Cardiovascular;  Laterality: N/A; . Eye surgery   HPI: 82 y.o. 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 He is admitted for sepsis secondary to PNA (RML and RLL per imaging report). Subjective: pt alert, denies any difficulties with swallowing Assessment / Plan / Recommendation CHL IP CLINICAL IMPRESSIONS 01/10/2016 Therapy Diagnosis Mild oral phase dysphagia;Suspected primary esophageal dysphagia Clinical Impression Pt demonstrates no significant oropharyngeal dysphagia worrisome for aspiration risk. There is slow bolus formation with occasional premature spillage to valleculae. Initial sips of nectar showed a delay in swallow initiation, but as study progressed pt was taking consecutive sips of thin liquids with timely swallow. Strength WNL, no penetration or aspiration occurred despite attempt to tax system. Pt did have some delayed coughing; esophageal sweep showed appearence of significant stasis with purees which did not clear with  sips of liquid. Pill transited with multiple bites and sips. Discussed esophageal precautions. Recommend regular diet and thin liquids. SLP will f/u for education and tolerance at bedside.  Impact on safety and function Moderate aspiration risk   CHL IP TREATMENT RECOMMENDATION 01/10/2016 Treatment Recommendations Therapy as outlined in treatment plan below   No flowsheet data found. CHL IP DIET RECOMMENDATION 01/10/2016 SLP Diet Recommendations Regular solids;Thin liquid Liquid Administration via Cup;Straw Medication Administration Whole meds with puree Compensations Slow rate;Small sips/bites;Follow solids with liquid Postural Changes Remain semi-upright after after feeds/meals (Comment);Seated upright at 90 degrees   CHL IP OTHER RECOMMENDATIONS 01/10/2016 Recommended Consults -- Oral Care Recommendations Patient independent with oral care Other Recommendations --   CHL IP FOLLOW UP RECOMMENDATIONS 01/10/2016 Follow up Recommendations None   CHL IP FREQUENCY AND DURATION 01/10/2016 Speech Therapy Frequency (ACUTE ONLY) min 1 x/week Treatment Duration 1 week      CHL IP ORAL PHASE 01/10/2016 Oral Phase Impaired Oral - Pudding Teaspoon --  Oral - Pudding Cup -- Oral - Honey Teaspoon -- Oral - Honey Cup -- Oral - Nectar Teaspoon -- Oral - Nectar Cup Delayed oral transit;Premature spillage Oral - Nectar Straw Delayed oral transit;Premature spillage Oral - Thin Teaspoon -- Oral - Thin Cup Premature spillage Oral - Thin Straw Premature spillage Oral - Puree WFL Oral - Mech Soft -- Oral - Regular WFL Oral - Multi-Consistency -- Oral - Pill -- Oral Phase - Comment --  CHL IP PHARYNGEAL PHASE 01/10/2016 Pharyngeal Phase Impaired Pharyngeal- Pudding Teaspoon -- Pharyngeal -- Pharyngeal- Pudding Cup -- Pharyngeal -- Pharyngeal- Honey Teaspoon -- Pharyngeal -- Pharyngeal- Honey Cup -- Pharyngeal -- Pharyngeal- Nectar Teaspoon -- Pharyngeal -- Pharyngeal- Nectar Cup Delayed swallow initiation-vallecula Pharyngeal -- Pharyngeal-  Nectar Straw Delayed swallow initiation-pyriform sinuses Pharyngeal -- Pharyngeal- Thin Teaspoon -- Pharyngeal -- Pharyngeal- Thin Cup WFL Pharyngeal -- Pharyngeal- Thin Straw WFL Pharyngeal -- Pharyngeal- Puree WFL Pharyngeal -- Pharyngeal- Mechanical Soft -- Pharyngeal -- Pharyngeal- Regular WFL Pharyngeal -- Pharyngeal- Multi-consistency -- Pharyngeal -- Pharyngeal- Pill WFL Pharyngeal -- Pharyngeal Comment --  CHL IP CERVICAL ESOPHAGEAL PHASE 01/10/2016 Cervical Esophageal Phase Impaired Pudding Teaspoon -- Pudding Cup -- Honey Teaspoon -- Honey Cup -- Nectar Teaspoon -- Nectar Cup -- Nectar Straw -- Thin Teaspoon -- Thin Cup -- Thin Straw -- Puree -- Mechanical Soft -- Regular -- Multi-consistency -- Pill -- Cervical Esophageal Comment -- CHL IP GO 12/14/2015 Functional Assessment Tool Used skilled clinical judgement Functional Limitations Motor speech Swallow Current Status [phone]) (None) Swallow Goal Status [id number]) (None) Swallow Discharge Status [id number]) (None) Motor Speech Current Status [id number]) [place] Motor Speech Goal Status UK:[id number]) [place] Motor Speech Goal Status SA:[id number]) [place] Spoken Language Comprehension Current Status [id number]) (None) Spoken Language Comprehension Goal Status [id number]) (None) Spoken Language Comprehension Discharge Status [id number]) (None) Spoken Language Expression Current Status [id number]) (None) Spoken Language Expression Goal Status [id number]) (None) Spoken Language Expression Discharge Status [phone]) (None) Attention Current Status [id number]) (None) Attention Goal Status [id number]) (None) Attention Discharge Status [id number]) (None) Memory Current Status [id number]) (None) Memory Goal Status [id number]) (None) Memory Discharge Status QC:[id number]) (None) Voice Current Status [id number]) (None) Voice Goal Status [id number]) (None) Voice Discharge Status [id number]) (None) Other Speech-Language Pathology Functional Limitation [id number]) (None) Other Speech-Language Pathology Functional Limitation Goal Status [id number]) (None) Other Speech-Language  Pathology Functional Limitation Discharge Status [phone]) (None) Herbie Baltimore, MA CCC-SLP [phone] [place], Katherene Ponto 01/10/2016, 10:15 AM                Assessment and Plan  82 y/o M with history of CAD s/p CABG 2000 (recent cath 11/2015 with patent grafts), 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 Admitted 11/2015 with + troponins with patent grafts, planned for outpatient TAVR eval. Admitted 12/2015 with HCAP and staph bacteremia, TEE neg for endocarditis. Readmitted 02/03/2016 with weakness, cough, found to have recurrent PNA along with new RLE DVT and submassive bilateral PE along with AKI on CKD, progressive anemia, transaminitis, hypoalbuminemia. Cardiology asked to see for positive troponin and possible RHF.  1. Elevated troponin - suspect demand ischemia in the setting of multiple stressors including known severe AS. Severity in drop in EF is worrisome although he denies recent ischemic-type symptoms. Recent fluctuating EF noted - 50-55% by echo 11/24/15, 30-35% by cath 12/12/15 (when anatomy was stable), 55-60% by TEE 01/12/16,  then 25-30% by echo 02/05/16. Recent cath 11/2015 was stable. No further acute workup planned this admission.  2. Acute on chronic now combined CHF with right heart failure - part of fluid retention may be due to low albumin as well. Continue Toprol. Once finished diuresing, consider readdition of ARB if Cr stable. Good diuresis yesterday - await today's weight before making decision about PM Lasix. - would be judicious in setting of PE. -- consider converting to PO Lasix after AM dose tomorrow as BUN is increasing (although this could be related to ? GIB).  3. Severe aortic stenosis - does not sound like patient was recently  symptomatic with this prior to admission. Reports good progress with PT at [facility]. Can defer decision about TAVR referral to outpatient.  4. Submassive bilateral PE - further per IM. He was recently started on Plavix for stroke. Primary team may want to discuss with neurology whether switching to other anticoagulant in place on Plavix will suffice as stroke rx.   5. Anemia, transaminitis, AKI on CKD, PNA - per IM.  AKI  & LFTs improving. ? Related to congestive hepatopathy/nephropathy with acute PE related RH Failure.  Signed, Melina Copa PA-C Pager: [phone]   I have seen, examined and evaluated the patient this PM along with Mrs. Dunn, PA-C.  After reviewing all the available data and chart,  I agree with her findings, examination as well as impression recommendations.  Very difficult scenario -- Pt with CAD-CABG & now Severe AS (previously with ? Normal EF) & now severely reduced EF.  Initial referral for TAVR was delayed due to post cath CVA.  Now 2nd admission for PNA/CHF & now with new finding of Bilateral PE.  On IV Heparin &  Now worsening anemia -- low threshold for transfusion as Hgb is now < 8.   Principal Problem:   Acute respiratory failure with hypoxia (HCC) Active Problems:   CAD (coronary artery disease) of artery bypass graft   Aortic valve stenosis   Chronic diastolic CHF (congestive heart failure) (HCC)   Anemia due to acute blood loss   Valvular cardiomyopathy (HCC)   Hyperlipidemia with target LDL less than 70   Essential hypertension   DM2 (diabetes mellitus, type 2) (HCC)   COPD, moderate (HCC)   Acute kidney failure (HCC)   Normocytic anemia   GERD (gastroesophageal reflux disease)   Sepsis (HCC)   HCAP (healthcare-associated pneumonia)   Stroke (cerebrum) (HCC)   Pleural effusion on right   Leg DVT (deep venous thromboembolism), acute (HCC)   Transaminitis   Bilateral pulmonary embolism (HCC)   DVT of lower extremity, bilateral (HCC)  -- not sure  what to make of reduced EF (seems that this has been up & down) -- would increase overall risk, but he does not appear to be in decompensated CHF.  ? If this is partially stress induced with existing CAD & AS in setting of PE.  Would like to reassess once more stable after current admission to determine where he settles out.  Recommend:  - continue IV diuresis today & consider converting to PO in AM to reassess (perhaps along with PRBC transfusion) - consider PRBC transfusion given cardiomyopathy, AS etc.  -- need to seek out potential bleed. - Once Hgb remains stable, will need to convert Heparin->warfarin  - continue Toprol.  If renal Fxn stable - restart ARB low dose in AM for afterload reduction.  Unfortunately, this admission will only serve to further delay TAVR.  Will need to reassess limited Echo in ~1 month or so to reassess EF.    Leonie Man, M.D., M.S. Interventional Cardiologist   Pager # [phone] Phone # [phone] [address] [place], [place] [address]

## 2016-02-06 NOTE — Consult Note (Signed)
   [facility] Inpatient Consult   02/06/2016  Crewe Skeeter Sep 23, 1928 [id number]   Patient is currently active with [facility]. He has been followed by [facility] Licensed CSW while at [facility]. Please see chart review tab then notes in EPIC for further [facility] correspondence. Will continue to follow. Made inpatient RNCM aware that patient is active with [facility].   Marthenia Rolling, MSN-Ed, RN,BSN [facility] Liaison [phone]

## 2016-02-06 NOTE — Progress Notes (Signed)
Inpatient Diabetes Program Recommendations  AACE/ADA: New Consensus Statement on Inpatient Glycemic Control (2015)  Target Ranges:  Prepandial:   less than 140 mg/dL      Peak postprandial:   less than 180 mg/dL (1-2 hours)      Critically ill patients:  140 - 180 mg/dL   Review of Glycemic Control Results for Douglas Carrillo, Douglas Carrillo (MRN [id number]) as of 02/06/2016 12:09  Ref. Range 02/05/2016 12:05 02/05/2016 16:13 02/05/2016 22:02 02/06/2016 08:53  Glucose-Capillary Latest Ref Range: 65-99 mg/dL 261 (H) 211 (H) 238 (H) 153 (H)  Results for Douglas Carrillo, Douglas Carrillo (MRN [id number]) as of 02/06/2016 12:09  Ref. Range 12/14/2015 03:54  Hemoglobin A1C Latest Ref Range: 4.8-5.6 % 6.8 (H)   Doubtful HgbA1C is accurate with low H/H. Post-prandial blood sugars elevated.   Inpatient Diabetes Program Recommendations:    Add Novolog 3 units tidwc for meal coverage insulin if pt eats > 50% meals  Will continue to follow. Thank you. Lorenda Peck, RD, LDN, CDE Inpatient Diabetes Coordinator [phone]

## 2016-02-06 NOTE — Progress Notes (Signed)
Pharmacy Antibiotic Note  Douglas Carrillo is a 82 y.o. male admitted on 02/03/2016 with pneumonia.  Pharmacy has been consulted for vancomycin and cefepime dosing. Patient also has BL PE.  Recently completed course of vancomycin for MRSA bacteremia.  PCT is elevated but improving. Renal function improving  Day #4 antibiotics  Plan:  Evaluate need to continue vancomycin with unrevealing cultures (no evidence of recurrence of MRSA bacteremia), afebrile, If vancomycin to continue, plan vancomycin trough tomorrow evening  Continue cefepime 2gm IV q24h  Height: 6' (182.9 cm) Weight: 181 lb (82.1 kg) IBW/kg (Calculated) : 77.6  Temp (24hrs), Avg:98.8 F (37.1 C), Min:97.3 F (36.3 C), Max:99.9 F (37.7 C)   Recent Labs Lab 02/03/16 1310 02/03/16 1322 02/03/16 1650 02/03/16 1923 02/03/16 2300 02/04/16 0416 02/05/16 0150 02/06/16 0334  WBC 18.5*  --   --   --   --  13.1* 9.5 7.2  CREATININE 1.54*  --   --   --   --  1.40* 1.39* 1.29*  LATICACIDVEN  --  3.04* 1.29 2.5* 1.7  --  1.4  --     Estimated Creatinine Clearance: 46 mL/min (by C-G formula based on Cr of 1.29).    Allergies  Allergen Reactions  . Mold Extract [Trichophyton] Anaphylaxis  . Lipitor [Atorvastatin] Other (See Comments)    Weakness in legs/myalgias    Antimicrobials this admission: 2/11 >> vanc >> 2/11 >> cefepime x8d >> 2/12 >> azithromycin >>  Levels/dose changes this admission: Previous admission 1/21 VT = 14 on 750mg  q12h Previous admission 1/24 VT = 18 on 1gm q12h  Microbiology Results: 1/17 BCx2: MRSA (Vanc MIC < 0.5) 2/11 MRSA PCR negative (positive on prev admission with MRSA bacteremia) 2/11 BCx: NG < 24h 2/13 resp virus panel: pending 2/13 sputum: not collected 2/13 Legionella 2/13 Strep: negative  Thank you for allowing pharmacy to be a part of this patient's care.  Doreene Eland, PharmD, BCPS.   Pager: [phone] 02/06/2016 7:47 AM

## 2016-02-07 ENCOUNTER — Ambulatory Visit: Payer: Medicare Other | Admitting: Physical Therapy

## 2016-02-07 ENCOUNTER — Encounter: Payer: Medicare Other | Admitting: Occupational Therapy

## 2016-02-07 LAB — BASIC METABOLIC PANEL
ANION GAP: 11 (ref 5–15)
BUN: 33 mg/dL — ABNORMAL HIGH (ref 6–20)
CO2: 21 mmol/L — ABNORMAL LOW (ref 22–32)
Calcium: 8.3 mg/dL — ABNORMAL LOW (ref 8.9–10.3)
Chloride: 104 mmol/L (ref 101–111)
Creatinine, Ser: 1.27 mg/dL — ABNORMAL HIGH (ref 0.61–1.24)
GFR calc Af Amer: 58 mL/min — ABNORMAL LOW (ref >60)
GFR, EST NON AFRICAN AMERICAN: 50 mL/min — AB (ref >60)
GLUCOSE: 149 mg/dL — AB (ref 65–99)
POTASSIUM: 3.4 mmol/L — AB (ref 3.5–5.1)
Sodium: 136 mmol/L (ref 135–145)

## 2016-02-07 LAB — GLUCOSE, CAPILLARY
GLUCOSE-CAPILLARY: 199 mg/dL — AB (ref 65–99)
Glucose-Capillary: 142 mg/dL — ABNORMAL HIGH (ref 65–99)
Glucose-Capillary: 174 mg/dL — ABNORMAL HIGH (ref 65–99)
Glucose-Capillary: 163 mg/dL — ABNORMAL HIGH (ref 65–99)

## 2016-02-07 LAB — MAGNESIUM: Magnesium: 1.9 mg/dL (ref 1.7–2.4)

## 2016-02-07 LAB — CBC
HCT: 29.4 % — ABNORMAL LOW (ref 39.0–52.0)
Hemoglobin: 9.1 g/dL — ABNORMAL LOW (ref 13.0–17.0)
MCH: 27.2 pg (ref 26.0–34.0)
MCHC: 31 g/dL (ref 30.0–36.0)
MCV: 88 fL (ref 78.0–100.0)
PLATELETS: 262 10*3/uL (ref 150–400)
RBC: 3.34 MIL/uL — AB (ref 4.22–5.81)
RDW: 19.5 % — ABNORMAL HIGH (ref 11.5–15.5)
WBC: 6.4 10*3/uL (ref 4.0–10.5)

## 2016-02-07 LAB — HEPATIC FUNCTION PANEL
ALBUMIN: 2.3 g/dL — AB (ref 3.5–5.0)
ALT: 429 U/L — AB (ref 17–63)
AST: 615 U/L — AB (ref 15–41)
Alkaline Phosphatase: 478 U/L — ABNORMAL HIGH (ref 38–126)
BILIRUBIN DIRECT: 0.2 mg/dL (ref 0.1–0.5)
Indirect Bilirubin: 0.6 mg/dL (ref 0.3–0.9)
TOTAL PROTEIN: 7 g/dL (ref 6.5–8.1)
Total Bilirubin: 0.8 mg/dL (ref 0.3–1.2)

## 2016-02-07 LAB — HEPARIN LEVEL (UNFRACTIONATED): Heparin Unfractionated: 0.45 IU/mL (ref 0.30–0.70)

## 2016-02-07 MED ORDER — POTASSIUM CHLORIDE CRYS ER 20 MEQ PO TBCR
40.0000 meq | EXTENDED_RELEASE_TABLET | Freq: Once | ORAL | Status: AC
  Administered 2016-02-07: 40 meq via ORAL
  Filled 2016-02-07: qty 2

## 2016-02-07 NOTE — Progress Notes (Signed)
Subjective: He reports breathing a lot better.  +hacking cough   Objective: Vital signs in last 24 hours: Temp:  [97.4 F (36.3 C)-100.2 F (37.9 C)] 97.4 F (36.3 C) (02/15 0800) Pulse Rate:  [80-95] 82 (02/15 0948) Resp:  [14-31] 26 (02/15 0948) BP: (113-143)/(47-73) 113/49 mmHg (02/15 0948) SpO2:  [95 %-100 %] 98 % (02/15 0948) Weight:  [180 lb 1.9 oz (81.7 kg)-202 lb 2.6 oz (91.7 kg)] 180 lb 1.9 oz (81.7 kg) (02/15 0500) Last BM Date: 02/06/16  Intake/Output from previous day: 02/14 0701 - 02/15 0700 In: 1747 [P.O.:480; I.V.:232.5; Blood:614.5; IV Piggyback:300] Out: 3900 [Urine:3900] Intake/Output this shift: Total I/O In: 412.5 [P.O.:240; I.V.:172.5] Out: 350 [Urine:350]  Medications Scheduled Meds: . arformoterol  15 mcg Nebulization BID  . azithromycin  500 mg Intravenous Daily  . budesonide (PULMICORT) nebulizer solution  0.25 mg Nebulization BID  . ceFEPime (MAXIPIME) IV  2 g Intravenous Q24H  . docusate sodium  100 mg Oral BID  . feeding supplement (PRO-STAT SUGAR FREE 64)  30 mL Oral BID  . ferrous sulfate  325 mg Oral BID WC  . fluticasone  2 spray Each Nare Daily  . furosemide  40 mg Intravenous Q12H  . guaiFENesin  1,200 mg Oral BID  . insulin aspart  0-15 Units Subcutaneous TID WC  . insulin glargine  28 Units Subcutaneous Daily  . ipratropium  0.5 mg Nebulization TID  . latanoprost  1 drop Both Eyes QHS  . levalbuterol  0.63 mg Nebulization TID  . loratadine  10 mg Oral Daily  . metoprolol succinate  25 mg Oral Daily  . oxybutynin  5 mg Oral Daily  . pantoprazole  40 mg Oral Q0600  . potassium chloride  40 mEq Oral Once  . protein supplement shake  8 oz Oral BID BM  . sodium chloride flush  3 mL Intravenous Q12H   Continuous Infusions: . heparin 1,250 Units/hr (02/06/16 1900)   PRN Meds:.acetaminophen **OR** acetaminophen, HYDROcodone-homatropine, ipratropium, levalbuterol, ondansetron **OR** ondansetron (ZOFRAN) IV  PE: General  appearance: alert, cooperative and no distress Neck: +JVD Lungs: Decreased BS bilaterally Heart: regular rate and rhythm and 2/6 sys MM Abdomen: +BS, nontender, palbable liver edge.  Extremities: 1+ LEE Pulses: 2+ and symmetric Skin: Warm and dry Neurologic: Grossly normal  Lab Results:   Recent Labs  02/05/16 0150 02/06/16 0334 02/07/16 0630  WBC 9.5 7.2 6.4  HGB 7.6* 7.4* 9.1*  HCT 24.7* 24.2* 29.4*  PLT 261 283 262   BMET  Recent Labs  02/05/16 0150 02/06/16 0334 02/07/16 0630  NA 136 137 136  K 4.2 4.2 3.4*  CL 108 109 104  CO2 18* 19* 21*  GLUCOSE 206* 199* 149*  BUN 25* 31* 33*  CREATININE 1.39* 1.29* 1.27*  CALCIUM 7.7* 8.1* 8.3*   PT/INR No results for input(s): LABPROT, INR in the last 72 hours. Cholesterol No results for input(s): CHOL in the last 72 hours. 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 Admitted 11/2015 with + troponins with patent grafts.  Admitted 12/2015 with HCAP and staph  bacteremia, TEE neg for endocarditis.  Readmitted 02/03/2016 with weakness, cough, found to have recurrent PNA along with new RLE DVT and submassive bilateral PE along with AKI on CKD, progressive anemia, transaminitis, hypoalbuminemia. .  Principal Problem:   Acute respiratory failure with hypoxia (HCC) Active Problems:   CAD (coronary artery disease) of artery bypass graft   Hyperlipidemia with target LDL less than 70   Essential hypertension   DM2 (diabetes mellitus, type 2) (HCC)   Aortic valve stenosis    COPD, moderate (HCC)   Acute kidney failure (HCC)   Normocytic anemia   GERD (gastroesophageal reflux disease)   Sepsis (HCC)   HCAP (healthcare-associated pneumonia)   Stroke (cerebrum) (HCC)   Pleural effusion on right   Leg DVT (deep venous thromboembolism), acute (HCC)   Chronic diastolic CHF (congestive heart failure) (HCC)   Transaminitis   Bilateral pulmonary embolism (HCC)   DVT of lower extremity, bilateral (HCC)   Anemia due to acute blood loss   Valvular cardiomyopathy ([place])  1. Elevated troponin -  suspected demand ischemia in the setting of multiple stressors including known severe AS. Severity in drop in EF is worrisome although he denies recent ischemic-type symptoms. Recent fluctuating EF noted - 50-55% by echo 11/24/15, 30-35% by cath 12/12/15 (when anatomy was stable), 55-60% by TEE 01/12/16, then 25-30% by echo 02/05/16. Recent cath 11/2015 was stable. No further acute workup planned this admission.  2. Acute on chronic now combined CHF with right heart failure  Net fluids:  -2.2L/-2.3L.   part of fluid retention may be due to low albumin as well. Continue Toprol. Once finished diuresing, consider readdition of ARB if Cr stable.  SCr stable this morning.     Continue IV lasix today.    3. Severe aortic stenosis -  does not sound like patient was recently symptomatic with this prior to admission. Reports good progress with PT at [facility]. Can defer decision about TAVR referral to outpatient.  4. Submassive bilateral PE - further per IM.  On IV heparin. He was recently started on Plavix for stroke. Primary team may want to discuss with neurology whether switching to other anticoagulant in place on Plavix will suffice as stroke rx.   5. Anemia, transaminitis, AKI on CKD, PNA - per IM.  S/P two units PRBCs.  AKI improving.  LFTs worsening today.  Liver edge palpable.   ? Related to congestive hepatopathy/nephropathy with acute PE related RH Failure.  6.  Hypokalemia  Replaced.  Additional K ordered for tonight.   7.  PVCs.    Many with bigeminy.  On toprol XL 25 daily   LOS: 4 days    HAGER, BRYAN PA-C 02/07/2016 10:19 AM   I have seen, examined and evaluated the patient this PM along with Mr. Samara Snide, [place].  After reviewing all the available data and chart,  I agree with his findings, examination as well as impression recommendations.  Transfused 2 Units PBRC with appropriate increase - feels a bit better With large palpable liver - would consider Abd [place] vs. CT to look for Portal & Hepatic Venous flow.  Will need to initiate full Anticoagulation once we are confident that he is not still bleeding on IV heparin - with elevated LFTs, I am reluctant to use Warfarin.  DOAC would be OK option provided renal Fxn is stable & no active bleed.  With recent transfusion & elevated LFTs (newly noted severe cardiomyopathy -- strange up & down EF from Cath in  Dec 2016) & AS - cannot exclude a component of combined S&DHF -   agree with continued diuresis today - consider converting to PO tomorrow afternoon  On Toprol - can probably restart ARB in AM.   Unfortunately, this admission will only serve to further delay TAVR.  Will need to reassess limited Echo in ~1 month or so to reassess EF.   Leonie Man, M.D., M.S. Interventional Cardiologist   Pager # [phone] Phone # [phone] [address] [place], [place] [address]

## 2016-02-07 NOTE — Progress Notes (Signed)
[facility] PROGRESS NOTE  Douglas Carrillo [id number] DOB: 1928-05-31 DOA: 02/03/2016 PCP: Limmie Patricia, MD  Summary 02/07/16: I have seen and examined Douglas Carrillo at bedside and reviewed his chart. Appreciate cardiology/pulmonary. Douglas Carrillo is a very pleasant 82 year old male being treated for acute respiratory failure with hypoxia resulting from pulmonary embolism/?HCAP/pleural effusion. He has required PRBC transfusion. He is on heparin/broad-spectrum antibiotics. There is no overt bleeding. Anemia concerning his findings long-term anticoagulation is concerned. Will continue anticoagulation/empiric antibiotics and monitor for further evidence of bleeding. Patient still slightly dyspneic therefore will keep in SDU. Plan Acute respiratory failure with hypoxia (HCC)/COPD, moderate (HCC)/Sepsis (HCC)/Transaminitis/HCAP (healthcare-associated pneumonia)/Pleural effusion on right  Continue Cefepime/Zithromax to complete course of antibiotics  Oxygen supplementation/bronchodilators as needed  Follow pulmonary recommendations-?Pleural tap if worsening dyspnea CAD (coronary artery disease) of artery bypass graft/Aortic valve stenosis/acute on Chronic diastolic CHF (congestive heart failure) (HCC) EF 30%/Valvular cardiomyopathy (HCC)/Essential hypertension  EF 30%  On Lasix/Lopressor  Follow cardiology recommendations Bilateral pulmonary embolism (HCC)/DVT of lower extremity, bilateral (HCC)/Leg DVT (deep venous thromboembolism), acute ([place])  Patient on heparin per pharmacy. Appreciate pharmacy help.  Will continue heparin for now and monitor hemoglobin, then eventually transfusion to oral anticoagulation if no evidence of further blood loss Normocytic anemia/Anemia due to acute blood loss  Monitor for evidence of bleeding  Transfuse PRBC as needed  May need further investigations if recurrent blood loss Hyperlipidemia with target LDL less than 70/DM2 (diabetes mellitus,  type 2) (HCC)/GERD (gastroesophageal reflux disease)  Hemoglobin A1c 6.8  Sugars generally controlled  Continue Lantus/SSI  PPI Acute kidney failure ([place])  Likely due to a combination of factors including diuretics  Appears stable  Monitor bun/cr  Code Status: DNR Family Communication: None at bedside Disposition Plan: Likely SNF weekend or early next week   Consultants:  Pulmonary  Cardiology  Procedures:    Antibiotics:  Cefepime  Zithromax  HPI/Subjective: Feels ok  Objective: Filed Vitals:   02/07/16 1400 02/07/16 1600  BP: 102/52 128/67  Pulse: 94 90  Temp:  99.8 F (37.7 C)  Resp: 24 29    Intake/Output Summary (Last 24 hours) at 02/07/16 1747 Last data filed at 02/07/16 1600  Gross per 24 hour  Intake 2234.71 ml  Output   4450 ml  Net -2215.29 ml   Filed Weights   02/05/16 0655 02/06/16 1321 02/07/16 0500  Weight: 82.1 kg (181 lb) 91.7 kg (202 lb 2.6 oz) 81.7 kg (180 lb 1.9 oz)    Exam:   General:  Slightly dyspneic.  Cardiovascular: S1-S2 normal. No murmurs. Pulse regular.  Respiratory: Good air entry bilaterally. Bilateral rhonchi no rales.  Abdomen: Soft and nontender. Normal bowel sounds. No organomegaly.  Musculoskeletal: No pedal edema   Neurological: Intact  Data Reviewed: Basic Metabolic Panel:  Recent Labs Lab 02/03/16 1310 02/04/16 0416 02/05/16 0150 02/06/16 0334 02/07/16 0630  NA 135 137 136 137 136  K 5.2* 5.0 4.2 4.2 3.4*  CL 103 109 108 109 104  CO2 19* 17* 18* 19* 21*  GLUCOSE 190* 229* 206* 199* 149*  BUN 21* 24* 25* 31* 33*  CREATININE 1.54* 1.40* 1.39* 1.29* 1.27*  CALCIUM 8.6* 7.8* 7.7* 8.1* 8.3*  MG  --   --  2.2  --  1.9   Liver Function Tests:  Recent Labs Lab 02/03/16 1310 02/04/16 0416 02/05/16 0150 02/06/16 0815 02/07/16 0630  AST 46* 129* 687* 291* 615*  ALT 39 70* 336* 290* 429*  ALKPHOS 138* 143* 267* 335* 478*  BILITOT 1.2 0.9 0.6 0.5 0.8  PROT 8.1 6.8 6.5 6.7 7.0   ALBUMIN 2.9* 2.3* 2.2* 2.3* 2.3*   No results for input(s): LIPASE, AMYLASE in the last 168 hours. No results for input(s): AMMONIA in the last 168 hours. CBC:  Recent Labs Lab 02/03/16 1310 02/04/16 0416 02/05/16 0150 02/06/16 0334 02/07/16 0630  WBC 18.5* 13.1* 9.5 7.2 6.4  NEUTROABS 16.9*  --  7.8*  --   --   HGB 9.1* 8.1* 7.6* 7.4* 9.1*  HCT 29.2* 25.9* 24.7* 24.2* 29.4*  MCV 89.3 89.3 89.5 90.3 88.0  PLT 318 229 261 283 262   Cardiac Enzymes:  Recent Labs Lab 02/04/16 1435 02/04/16 1931 02/05/16 0150  TROPONINI 1.05* 1.04* 0.96*   BNP (last 3 results)  Recent Labs  01/09/16 0704 02/04/16 1435 02/06/16 0815  BNP 338.3* 1553.6* 1933.8*    ProBNP (last 3 results) No results for input(s): PROBNP in the last 8760 hours.  CBG:  Recent Labs Lab 02/06/16 2130 02/06/16 2206 02/07/16 0758 02/07/16 1159 02/07/16 1612  GLUCAP 69 87 142* 199* 174*    Recent Results (from the past 240 hour(s))  Culture, blood (routine x 2)     Status: None (Preliminary result)   Collection Time: 02/03/16  1:10 PM  Result Value Ref Range Status   Specimen Description BLOOD BLOOD LEFT FOREARM  Final   Special Requests BOTTLES DRAWN AEROBIC AND ANAEROBIC 5CC  Final   Culture   Final    NO GROWTH 4 DAYS Performed at [facility]    Report Status PENDING  Incomplete  Culture, blood (routine x 2)     Status: None (Preliminary result)   Collection Time: 02/03/16  1:12 PM  Result Value Ref Range Status   Specimen Description BLOOD BLOOD LEFT FOREARM  Final   Special Requests IN PEDIATRIC BOTTLE 3CC  Final   Culture   Final    NO GROWTH 4 DAYS Performed at [facility]    Report Status PENDING  Incomplete  MRSA PCR Screening     Status: None   Collection Time: 02/03/16  6:53 PM  Result Value Ref Range Status   MRSA by PCR NEGATIVE NEGATIVE Final    Comment:        The GeneXpert MRSA Assay (FDA approved for NASAL specimens only), is one component of  a comprehensive MRSA colonization surveillance program. It is not intended to diagnose MRSA infection nor to guide or monitor treatment for MRSA infections.      Studies: No results found.  Scheduled Meds: . arformoterol  15 mcg Nebulization BID  . azithromycin  500 mg Intravenous Daily  . budesonide (PULMICORT) nebulizer solution  0.25 mg Nebulization BID  . ceFEPime (MAXIPIME) IV  2 g Intravenous Q24H  . docusate sodium  100 mg Oral BID  . feeding supplement (PRO-STAT SUGAR FREE 64)  30 mL Oral BID  . ferrous sulfate  325 mg Oral BID WC  . fluticasone  2 spray Each Nare Daily  . furosemide  40 mg Intravenous Q12H  . guaiFENesin  1,200 mg Oral BID  . insulin aspart  0-15 Units Subcutaneous TID WC  . insulin glargine  28 Units Subcutaneous Daily  . ipratropium  0.5 mg Nebulization TID  . latanoprost  1 drop Both Eyes QHS  . levalbuterol  0.63 mg Nebulization TID  . loratadine  10 mg Oral Daily  . metoprolol succinate  25 mg Oral Daily  . oxybutynin  5 mg  Oral Daily  . pantoprazole  40 mg Oral Q0600  . potassium chloride  40 mEq Oral Once  . protein supplement shake  8 oz Oral BID BM  . sodium chloride flush  3 mL Intravenous Q12H   Continuous Infusions: . heparin 1,250 Units/hr (02/06/16 1900)     Time spent: 25 minutes    Jahnae Mcadoo  [facility] Pager [phone]. If 7PM-7AM, please contact night-coverage at [email or web address], password [facility] 02/07/2016, 5:47 PM  LOS: 4 days

## 2016-02-07 NOTE — Progress Notes (Signed)
[facility] for IV heparin Indication: VTE Treatment  Allergies  Allergen Reactions  . Mold Extract [Trichophyton] Anaphylaxis  . Lipitor [Atorvastatin] Other (See Comments)    Weakness in legs/myalgias    Patient Measurements: Height: 6' (182.9 cm) Weight: 180 lb 1.9 oz (81.7 kg) IBW/kg (Calculated) : 77.6 Heparin Dosing Weight: 82.7kg  Vital Signs: Temp: 97.4 F (36.3 C) (02/15 0800) Temp Source: Oral (02/15 0800) BP: 113/49 mmHg (02/15 0948) Pulse Rate: 82 (02/15 0948)  Labs:  Recent Labs  02/04/16 1435  02/04/16 1931  02/05/16 0150 02/05/16 1800 02/06/16 0334 02/07/16 0630  HGB  --   --   --   < > 7.6*  --  7.4* 9.1*  HCT  --   --   --   --  24.7*  --  24.2* 29.4*  PLT  --   --   --   --  261  --  283 262  HEPARINUNFRC  --   < > 0.43  --  0.67 0.48 0.46 0.45  CREATININE  --   --   --   --  1.39*  --  1.29* 1.27*  TROPONINI 1.05*  --  1.04*  --  0.96*  --   --   --   < > = values in this interval not displayed.  Estimated Creatinine Clearance: 46.7 mL/min (by C-G formula based on Cr of 1.27).   Assessment: 56 yoM with COPD on home 02, CAD, CKD, HTN, severe aortic stenosis and recent hospitalization for staph bacteremia and HCAP presents 2/11 with SOB and cough found to have recurrent right-sided PNA and probably parapneumonic effusion with elevated LA and tachycardia, started on antibiotics.  Dopplers now positive for DVT in peroneal veins, left distal popliteal vein, left soleal veins, and left peroneal veins.  VQ scan reveals BL PE (categorized as at least submassive with R heart strain noted). Pharmacy consulted to start IV heparin for VTE treatment.   PTA on plavix (?not aspirin) though pt reports not taking medication.  Resumed inpatient. Stopped 2/14 by [facility]  Today, 02/07/2016:.  Heparin level remains therapeutic on 1250 units/hr  CBC: Hgb improved s/p PRBC 2/14, pltc WNL  CTA 2/12 revealed BL PE with evidence of R  heart strain  2/13 ECHO reveals: EF = 20-25% with mod AS, RV with moderately reduced systolic fx and moderate pulmonary HTN  Troponin elevated - likely demand ischemia per cardiology  SCr elevated mildly - no change  LFTs trending up - worsening again. ? Related to hepatic congestion from R-sided HF d/t PE.  Statin was stopped 2/13 No bleeding noted  Goal of Therapy:  Heparin level 0.3-0.7 units/ml Monitor platelets by anticoagulation protocol: Yes   Plan:   Continue heparin at 1250 units/hr  Daily heparin level and CBC.  Recheck INR in am  Current plan is to change to warfarin when appropriate.  Would likely need to be conservative due to elevated LFTs   Doreene Eland, PharmD, BCPS.   Pager: [phone] 02/07/2016 10:44 AM

## 2016-02-07 NOTE — Progress Notes (Signed)
Date: February 07, 2016 Chart reviewed for concurrent status and case management needs. Will continue to follow patient for changes and needs:  Bilateral P.E.'s,iv heparin, [place] 02 at 3liters/pm Velva Harman, BSN, [place], [place]   [phone]

## 2016-02-07 NOTE — Clinical Social Work Note (Signed)
Clinical Social Work Assessment  Patient Details  Name: Douglas Carrillo MRN: [id number] Date of Birth: 03-13-1928  Date of referral:  01/31/16               Reason for consult:  Discharge Planning, Facility Placement                Permission sought to share information with:  Facility Art therapist granted to share information::  Yes, Verbal Permission Granted  Name::        Agency::     Relationship::     Contact Information:     Housing/Transportation Living arrangements for the past 2 months:  [place], [place] of Information:  Adult Children Patient Interpreter Needed:  None Criminal Activity/Legal Involvement Pertinent to Current Situation/Hospitalization:  No - Comment as needed Significant Relationships:  Adult Children Lives with:  Facility Resident, Adult Children Do you feel safe going back to the place where you live?  No Need for family participation in patient care:  Yes (Comment)  Care giving concerns:  Family reported that they wished SNF contacted MD sooner than they did to perhaps prevent hospitalization.   Social Worker assessment / plan:  Pt hospitalized on 02/03/16 with acute on chronic respiratory failure. Pt was recently dc from [facility] to [facility] for [place]. CSW met with pt's daughter, Douglas Carrillo, to assist with d/c planning. Pt was sleeping during our visit. Kim reports that she is not holding bed at [facility]. New SNF search has been initiated and bed offers pending. CSW will continue to follow to assist with d/c planning needs.  Employment status:  Retired Forensic scientist:  Medicare PT Recommendations:  Not assessed at this time [place] / Referral to community resources:  [place] Patient/Family's Response to care:  Family feels continued SNF placement is needed. They are interested in additional SNF options.   Patient/Family's Understanding of and Emotional Response to  Diagnosis, Current Treatment, and Prognosis:  Daughter is aware of pt's medical status. She feels additional rehab is needed following hospital d/c.   Emotional Assessment Appearance:  Appears stated age Attitude/Demeanor/Rapport:  Unable to Assess Affect (typically observed):  Unable to Assess Orientation:  Oriented to Self, Oriented to Place, Oriented to  Time, Oriented to Situation Alcohol / Substance use:  Not Applicable Psych involvement (Current and /or in the community):  No (Comment)  Discharge Needs  Concerns to be addressed:  Discharge Planning Concerns Readmission within the last 30 days:  Yes Current discharge risk:  None Barriers to Discharge:  No Barriers Identified   Douglas Carrillo  [phone] 02/07/2016, 4:54 PM

## 2016-02-07 NOTE — NC FL2 (Signed)
[place] LEVEL OF CARE SCREENING TOOL     IDENTIFICATION  Patient Name: Douglas Carrillo Birthdate: [birth year] Sex: male Admission Date (Current Location): 02/03/2016  [facility] and [place] Number:  Herbalist and Address:  [facility],  [place], [place]      Provider Number: [id number]  Attending Physician Name and Address:  Nat Math, MD  Relative Name and Phone Number:  [name] daughter, [phone]    Current Level of Care: Hospital Recommended Level of Care: [place] Prior Approval Number:    Date Approved/Denied:   PASRR Number: [id number]  Discharge Plan: SNF    Current Diagnoses: Patient Active Problem List   Diagnosis Date Noted  . Anemia due to acute blood loss 02/06/2016  . Valvular cardiomyopathy ([place]) 02/06/2016  . Chronic diastolic CHF (congestive heart failure) ([place]) 02/05/2016  . Transaminitis 02/05/2016  . Bilateral pulmonary embolism ([place]) 02/05/2016  . DVT of lower extremity, bilateral ([place]) 02/05/2016  . Pleural effusion on right 02/04/2016  . Leg DVT (deep venous thromboembolism), acute ([place]) 02/04/2016  . Occult blood positive stool 01/26/2016  . Sepsis ([place]) 01/09/2016  . Acute respiratory failure ([place]) 01/09/2016  . HCAP (healthcare-associated pneumonia) 01/09/2016  . CKD (chronic kidney disease) 01/09/2016  . Stroke (cerebrum) ([place])   . Acute respiratory failure with hypoxia ([place])   . Stroke, acute, thrombotic ([place]) 12/15/2015  . Hemiparesis affecting dominant side as late effect of stroke ([place]) 12/15/2015  . Anemia 12/15/2015  . Spastic neurogenic bladder 12/15/2015  . GERD (gastroesophageal reflux disease) 12/15/2015  . Hypokalemia 12/15/2015  . Cerebral infarction due to unspecified mechanism   . CVA (cerebral infarction) 12/13/2015  . Prolonged Q-T interval on ECG 12/13/2015  . Right sided weakness 12/13/2015  . Elevated troponin 12/13/2015  . Acute kidney  failure ([place]) 12/13/2015  . Normocytic anemia 12/13/2015  . Diabetes mellitus with complication ([place])   . CAD in native artery   . Aortic stenosis   . OSA (obstructive sleep apnea) 02/08/2015  . COPD, moderate ([place]) 01/12/2015  . COPD (chronic obstructive pulmonary disease) ([place]) 11/10/2014  . Nocturnal hypoxia 11/10/2014  . Dyspnea and respiratory abnormality 10/19/2014  . Aortic valve stenosis 05/27/2014  . CAD (coronary artery disease) of artery bypass graft 11/30/2013  . CAD (coronary artery disease) 11/30/2013  . Hyperlipidemia with target LDL less than 70 11/30/2013  . Essential hypertension 11/30/2013  . DM2 (diabetes mellitus, type 2) ([place]) 11/30/2013  . RBBB 11/30/2013    Orientation RESPIRATION BLADDER Height & Weight     Self, Time, Situation, Place  O2 Incontinent Weight: 81.7 kg (180 lb 1.9 oz) Height:  6' (182.9 cm)  BEHAVIORAL SYMPTOMS/MOOD NEUROLOGICAL BOWEL NUTRITION STATUS  Other (Comment) (no behaviors)   Continent Diet  AMBULATORY STATUS COMMUNICATION OF NEEDS Skin   Extensive Assist Verbally Normal                       Personal Care Assistance Level of Assistance  Bathing, Feeding, Dressing Bathing Assistance: Limited assistance Feeding assistance: Limited assistance Dressing Assistance: Limited assistance     Functional Limitations Info  Sight, Hearing, Speech Sight Info: Impaired Hearing Info: Adequate Speech Info: Adequate    SPECIAL CARE FACTORS FREQUENCY  PT (By licensed PT)     PT Frequency: 5 x wk              Contractures      Additional Factors Info  Code Status Code  Status Info: DNR             Current Medications (02/07/2016):  This is the current hospital active medication list Current Facility-Administered Medications  Medication Dose Route Frequency Provider Last Rate Last Dose  . acetaminophen (TYLENOL) tablet 650 mg  650 mg Oral Q6H PRN Bonnielee Haff, MD   650 mg at 02/03/16 2023   Or  . acetaminophen  (TYLENOL) suppository 650 mg  650 mg Rectal Q6H PRN Bonnielee Haff, MD      . arformoterol (BROVANA) nebulizer solution 15 mcg  15 mcg Nebulization BID Eugenie Filler, MD   15 mcg at 02/07/16 0947  . azithromycin (ZITHROMAX) 500 mg in dextrose 5 % 250 mL IVPB  500 mg Intravenous Daily Irine Seal V, MD   500 mg at 02/07/16 1051  . budesonide (PULMICORT) nebulizer solution 0.25 mg  0.25 mg Nebulization BID Eugenie Filler, MD   0.25 mg at 02/07/16 0947  . ceFEPIme (MAXIPIME) 2 g in dextrose 5 % 50 mL IVPB  2 g Intravenous Q24H Bonnielee Haff, MD   2 g at 02/06/16 2208  . docusate sodium (COLACE) capsule 100 mg  100 mg Oral BID Bonnielee Haff, MD   100 mg at 02/07/16 1049  . feeding supplement (PRO-STAT SUGAR FREE 64) liquid 30 mL  30 mL Oral BID Eugenie Filler, MD   30 mL at 02/07/16 1054  . ferrous sulfate tablet 325 mg  325 mg Oral BID WC Bonnielee Haff, MD   325 mg at 02/07/16 1606  . fluticasone (FLONASE) 50 MCG/ACT nasal spray 2 spray  2 spray Each Nare Daily Irine Seal V, MD   2 spray at 02/07/16 1055  . furosemide (LASIX) injection 40 mg  40 mg Intravenous Q12H Eugenie Filler, MD   40 mg at 02/07/16 1606  . guaiFENesin (MUCINEX) 12 hr tablet 1,200 mg  1,200 mg Oral BID Irine Seal V, MD   1,200 mg at 02/07/16 1049  . heparin ADULT infusion 100 units/mL (25000 units/250 mL)  1,250 Units/hr Intravenous Continuous Berton Mount, RPH 12.5 mL/hr at 02/06/16 1900 1,250 Units/hr at 02/06/16 1900  . HYDROcodone-homatropine (HYCODAN) 5-1.5 MG/5ML syrup 5 mL  5 mL Oral Q6H PRN Eugenie Filler, MD   5 mL at 02/07/16 0629  . insulin aspart (novoLOG) injection 0-15 Units  0-15 Units Subcutaneous TID WC Eugenie Filler, MD   3 Units at 02/07/16 1638  . insulin glargine (LANTUS) injection 28 Units  28 Units Subcutaneous Daily Eugenie Filler, MD   28 Units at 02/07/16 0221  . ipratropium (ATROVENT) nebulizer solution 0.5 mg  0.5 mg Nebulization TID Eugenie Filler, MD   0.5  mg at 02/07/16 1436  . ipratropium (ATROVENT) nebulizer solution 0.5 mg  0.5 mg Nebulization Q6H PRN Eugenie Filler, MD      . latanoprost (XALATAN) 0.005 % ophthalmic solution 1 drop  1 drop Both Eyes QHS Bonnielee Haff, MD   1 drop at 02/06/16 2208  . levalbuterol (XOPENEX) nebulizer solution 0.63 mg  0.63 mg Nebulization TID Eugenie Filler, MD   0.63 mg at 02/07/16 1436  . levalbuterol (XOPENEX) nebulizer solution 0.63 mg  0.63 mg Nebulization Q6H PRN Eugenie Filler, MD      . loratadine (CLARITIN) tablet 10 mg  10 mg Oral Daily Eugenie Filler, MD   10 mg at 02/07/16 1049  . metoprolol succinate (TOPROL-XL) 24 hr tablet 25 mg  25 mg Oral  Daily Bonnielee Haff, MD   25 mg at 02/07/16 1049  . ondansetron (ZOFRAN) tablet 4 mg  4 mg Oral Q6H PRN Bonnielee Haff, MD       Or  . ondansetron (ZOFRAN) injection 4 mg  4 mg Intravenous Q6H PRN Bonnielee Haff, MD      . oxybutynin (DITROPAN-XL) 24 hr tablet 5 mg  5 mg Oral Daily Bonnielee Haff, MD   5 mg at 02/07/16 1049  . pantoprazole (PROTONIX) EC tablet 40 mg  40 mg Oral Q0600 Eugenie Filler, MD   40 mg at 02/07/16 0542  . potassium chloride SA (K-DUR,KLOR-CON) CR tablet 40 mEq  40 mEq Oral Once Brett Canales, PA-C      . protein supplement (PREMIER PROTEIN) liquid  8 oz Oral BID BM Eugenie Filler, MD   8 oz at 02/07/16 1400  . sodium chloride flush (NS) 0.9 % injection 3 mL  3 mL Intravenous Q12H Bonnielee Haff, MD   3 mL at 02/07/16 1055     Discharge Medications: Please see discharge summary for a list of discharge medications.  Relevant Imaging Results:  Relevant Lab Results:   Additional Information SSN: [id number]. MRSA + pcr 01/09/16 no encouter. Droplet precautions.   Jema Deegan, Randall An, LCSW

## 2016-02-07 NOTE — Clinical Social Work Placement (Signed)
   CLINICAL SOCIAL WORK PLACEMENT  NOTE  Date:  02/07/2016  Patient Details  Name: Douglas Carrillo MRN: [id number] Date of Birth: [birth year]  Clinical Social Work is seeking post-discharge placement for this patient at the [place] level of care (*CSW will initial, date and re-position this form in  chart as items are completed):  Yes   Patient/family provided with [facility] Work Department's list of facilities offering this level of care within the geographic area requested by the patient (or if unable, by the patient's family).  Yes   Patient/family informed of their freedom to choose among providers that offer the needed level of care, that participate in Medicare, Medicaid or managed care program needed by the patient, have an available bed and are willing to accept the patient.  Yes   Patient/family informed of [facility]'s ownership interest in [facility] and [facility], as well as of the fact that they are under no obligation to receive care at these facilities.  PASRR submitted to EDS on       PASRR number received on       Existing PASRR number confirmed on 02/07/16     FL2 transmitted to all facilities in geographic area requested by pt/family on 02/07/16     FL2 transmitted to all facilities within larger geographic area on       Patient informed that his/her managed care company has contracts with or will negotiate with certain facilities, including the following:            Patient/family informed of bed offers received.  Patient chooses bed at       Physician recommends and patient chooses bed at      Patient to be transferred to   on  .  Patient to be transferred to facility by       Patient family notified on   of transfer.  Name of family member notified:        PHYSICIAN       Additional Comment:    _______________________________________________ Luretha Rued, [place] 02/07/2016, 5:04  PM

## 2016-02-08 ENCOUNTER — Inpatient Hospital Stay (HOSPITAL_COMMUNITY): Payer: Medicare Other

## 2016-02-08 DIAGNOSIS — I248 Other forms of acute ischemic heart disease: Secondary | ICD-10-CM | POA: Diagnosis present

## 2016-02-08 DIAGNOSIS — I5041 Acute combined systolic (congestive) and diastolic (congestive) heart failure: Secondary | ICD-10-CM | POA: Diagnosis present

## 2016-02-08 DIAGNOSIS — I25810 Atherosclerosis of coronary artery bypass graft(s) without angina pectoris: Secondary | ICD-10-CM

## 2016-02-08 LAB — GLUCOSE, CAPILLARY
GLUCOSE-CAPILLARY: 130 mg/dL — AB (ref 65–99)
GLUCOSE-CAPILLARY: 224 mg/dL — AB (ref 65–99)
Glucose-Capillary: 123 mg/dL — ABNORMAL HIGH (ref 65–99)
Glucose-Capillary: 137 mg/dL — ABNORMAL HIGH (ref 65–99)

## 2016-02-08 LAB — RESPIRATORY VIRUS PANEL
Adenovirus: NEGATIVE
INFLUENZA B 1: NEGATIVE
Influenza A: NEGATIVE
METAPNEUMOVIRUS: NEGATIVE
PARAINFLUENZA 3 A: NEGATIVE
Parainfluenza 1: NEGATIVE
Parainfluenza 2: NEGATIVE
RESPIRATORY SYNCYTIAL VIRUS A: NEGATIVE
RHINOVIRUS: NEGATIVE
Respiratory Syncytial Virus B: NEGATIVE

## 2016-02-08 LAB — PROTIME-INR
INR: 1.61 — ABNORMAL HIGH (ref 0.00–1.49)
PROTHROMBIN TIME: 19.1 s — AB (ref 11.6–15.2)

## 2016-02-08 LAB — HEPATIC FUNCTION PANEL
ALT: 13 U/L — AB (ref 17–63)
AST: 34 U/L (ref 15–41)
Albumin: 3.1 g/dL — ABNORMAL LOW (ref 3.5–5.0)
Alkaline Phosphatase: 48 U/L (ref 38–126)
BILIRUBIN DIRECT: 0.2 mg/dL (ref 0.1–0.5)
BILIRUBIN INDIRECT: 0.8 mg/dL (ref 0.3–0.9)
TOTAL PROTEIN: 5.3 g/dL — AB (ref 6.5–8.1)
Total Bilirubin: 1 mg/dL (ref 0.3–1.2)

## 2016-02-08 LAB — CULTURE, BLOOD (ROUTINE X 2)
CULTURE: NO GROWTH
Culture: NO GROWTH

## 2016-02-08 LAB — CBC
HEMATOCRIT: 28.9 % — AB (ref 39.0–52.0)
HEMOGLOBIN: 9.4 g/dL — AB (ref 13.0–17.0)
MCH: 28.5 pg (ref 26.0–34.0)
MCHC: 32.5 g/dL (ref 30.0–36.0)
MCV: 87.6 fL (ref 78.0–100.0)
Platelets: 274 10*3/uL (ref 150–400)
RBC: 3.3 MIL/uL — AB (ref 4.22–5.81)
RDW: 19.7 % — ABNORMAL HIGH (ref 11.5–15.5)
WBC: 7.3 10*3/uL (ref 4.0–10.5)

## 2016-02-08 LAB — TYPE AND SCREEN
ABO/RH(D): B POS
ANTIBODY SCREEN: NEGATIVE
Unit division: 0
Unit division: 0

## 2016-02-08 LAB — BASIC METABOLIC PANEL
ANION GAP: 9 (ref 5–15)
BUN: 36 mg/dL — ABNORMAL HIGH (ref 6–20)
CHLORIDE: 104 mmol/L (ref 101–111)
CO2: 23 mmol/L (ref 22–32)
CREATININE: 1.22 mg/dL (ref 0.61–1.24)
Calcium: 8.1 mg/dL — ABNORMAL LOW (ref 8.9–10.3)
GFR calc non Af Amer: 52 mL/min — ABNORMAL LOW (ref >60)
Glucose, Bld: 152 mg/dL — ABNORMAL HIGH (ref 65–99)
Potassium: 3.7 mmol/L (ref 3.5–5.1)
SODIUM: 136 mmol/L (ref 135–145)

## 2016-02-08 LAB — PHOSPHORUS: PHOSPHORUS: 2.4 mg/dL — AB (ref 2.5–4.6)

## 2016-02-08 LAB — MAGNESIUM: MAGNESIUM: 1.9 mg/dL (ref 1.7–2.4)

## 2016-02-08 LAB — HEPARIN LEVEL (UNFRACTIONATED): HEPARIN UNFRACTIONATED: 0.38 [IU]/mL (ref 0.30–0.70)

## 2016-02-08 MED ORDER — METOPROLOL SUCCINATE ER 25 MG PO TB24
50.0000 mg | ORAL_TABLET | Freq: Every day | ORAL | Status: DC
  Administered 2016-02-08 – 2016-02-09 (×2): 50 mg via ORAL
  Filled 2016-02-08: qty 1
  Filled 2016-02-08: qty 2
  Filled 2016-02-08: qty 1

## 2016-02-08 MED ORDER — POLYVINYL ALCOHOL 1.4 % OP SOLN
1.0000 [drp] | OPHTHALMIC | Status: DC | PRN
Start: 2016-02-08 — End: 2016-02-13
  Administered 2016-02-08 – 2016-02-09 (×2): 1 [drp] via OPHTHALMIC
  Filled 2016-02-08: qty 15

## 2016-02-08 MED ORDER — NAPHAZOLINE-GLYCERIN 0.012-0.2 % OP SOLN: 1.0000 [drp] | Freq: Four times a day (QID) | OPHTHALMIC | Status: DC | PRN

## 2016-02-08 MED ORDER — LOSARTAN POTASSIUM 25 MG PO TABS
25.0000 mg | ORAL_TABLET | Freq: Every day | ORAL | Status: DC
  Administered 2016-02-09: 25 mg via ORAL
  Filled 2016-02-08 (×4): qty 1

## 2016-02-08 MED ORDER — ASPIRIN 81 MG PO CHEW
81.0000 mg | CHEWABLE_TABLET | Freq: Every day | ORAL | Status: DC
  Administered 2016-02-08 – 2016-02-13 (×5): 81 mg via ORAL
  Filled 2016-02-08 (×6): qty 1

## 2016-02-08 NOTE — Progress Notes (Signed)
Pt transferred to 1514. Full report given to RN Abby. Pt stable for transport on telemetry.

## 2016-02-08 NOTE — Progress Notes (Signed)
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 Admitted 11/2015 with + troponins with patent grafts.  Admitted 12/2015 with HCAP and staph bacteremia, TEE neg for endocarditis.  Readmitted 02/03/2016 with weakness, cough, found to have recurrent PNA along with new RLE DVT and submassive bilateral PE along with AKI on CKD, progressive anemia, transaminitis, hypoalbuminemia. . EF now noted to be ~25-30%. Principal Problem:   Acute respiratory failure with hypoxia (HCC) Active Problems:   CAD (coronary artery disease) of artery bypass graft   Aortic valve stenosis   Chronic diastolic CHF (congestive heart failure) (HCC)   Anemia due to acute blood loss   Valvular cardiomyopathy (HCC)   Acute combined systolic and diastolic heart failure (HCC)   Demand ischemia of myocardium (HCC)   Hyperlipidemia with target LDL less than 70   Essential hypertension   DM2 (diabetes mellitus, type 2) (HCC)   Stroke (cerebrum) (HCC)   COPD, moderate (HCC)   Acute kidney failure (HCC)   Normocytic anemia   GERD (gastroesophageal reflux disease)   Sepsis (HCC)   HCAP (healthcare-associated pneumonia)   Pleural effusion on right   Leg DVT (deep venous thromboembolism), acute (HCC)   Transaminitis   Bilateral pulmonary embolism (HCC)   DVT of lower extremity, bilateral (HCC)  Subjective: He reports breathing a lot better.   Still with brisk UOP +hacking cough still an issue   Objective: Vital signs in last 24 hours: Temp:  [97.5 F (36.4 C)-99.8 F (37.7 C)] 97.5 F (36.4 C) (02/16 0706) Pulse Rate:   [77-94] 85 (02/16 0600) Resp:  [19-33] 33 (02/16 0600) BP: (96-158)/(49-79) 140/65 mmHg (02/16 0600) SpO2:  [96 %-100 %] 96 % (02/16 0600) Last BM Date: 02/06/16  Intake/Output from previous day: 02/15 0701 - 02/16 0700 In: 1277.5 [P.O.:730; I.V.:297.5; IV Piggyback:250] Out: [id number] [Urine:3550] Intake/Output this shift:    Medications Scheduled Meds: . arformoterol  15 mcg Nebulization BID  . azithromycin  500 mg Intravenous Daily  . budesonide (PULMICORT) nebulizer solution  0.25 mg Nebulization BID  . ceFEPime (MAXIPIME) IV  2 g Intravenous Q24H  . docusate sodium  100 mg Oral BID  . feeding supplement (PRO-STAT SUGAR FREE 64)  30 mL Oral BID  . ferrous sulfate  325 mg Oral BID WC  . fluticasone  2 spray Each Nare Daily  . furosemide  40 mg Intravenous Q12H  . guaiFENesin  1,200 mg Oral BID  . insulin aspart  0-15 Units Subcutaneous TID WC  . insulin glargine  28 Units Subcutaneous Daily  . ipratropium  0.5 mg Nebulization TID  . latanoprost  1 drop Both Eyes QHS  . levalbuterol  0.63 mg Nebulization TID  . loratadine  10 mg Oral Daily  . losartan  25 mg Oral Daily  . metoprolol succinate  50 mg Oral Daily  . oxybutynin  5 mg Oral Daily  . pantoprazole  40 mg Oral Q0600  . protein supplement shake  8 oz Oral BID BM  . sodium chloride flush  3 mL Intravenous Q12H   Continuous Infusions: . heparin 1,250 Units/hr (02/08/16 0600)   PRN Meds:.acetaminophen **OR** acetaminophen,  HYDROcodone-homatropine, ipratropium, levalbuterol, ondansetron **OR** ondansetron (ZOFRAN) IV  PE: General appearance: alert, cooperative and no distress; a bit sleepy this AM Neck: +JVD to angle of the jaw Lungs: Decreased BS bilaterally - but non-labored Heart: regular rate and rhythm and 3/6 SEM @ RUSB-carotids Abdomen: +BS, nontender, palbable liver edge.  Extremities: 1+ LEE (R>L) Pulses: 2+ and symmetric Skin: Warm and dry Neurologic: Grossly normal  Lab Results:   Recent Labs   02/06/16 0334 02/07/16 0630 02/08/16 0311  WBC 7.2 6.4 7.3  HGB 7.4* 9.1* 9.4*  HCT 24.2* 29.4* 28.9*  PLT 283 262 274   BMET  Recent Labs  02/06/16 0334 02/07/16 0630 02/08/16 0311  NA 137 136 136  K 4.2 3.4* 3.7  CL 109 104 104  CO2 19* 21* 23  GLUCOSE 199* 149* 152*  BUN 31* 33* 36*  CREATININE 1.29* 1.27* 1.22  CALCIUM 8.1* 8.3* 8.1*   PT/INR  Recent Labs  02/08/16 0311  LABPROT 19.1*  INR 1.61*   Cholesterol No results for input(s): CHOL in the last 72 hours. Cardiac Enzymes Invalid input(s): TROPONIN,  CKMB  Studies/Results: - no new studies RUQ [place] not performed  Assessment/Plan    Principal Problem:   Acute respiratory failure with hypoxia (HCC) - combination of PE, new onset CHF (with cardiomyopathy); now on [place] O2 & breathing improving with diuresis.  Active Problems:   CAD (coronary artery disease) of artery bypass graft - no new occlusive disease on cath as of Dec 2016  No active angina Sx.  OK to stop Plavix as we are planning oral anticoagulation (Plavix was for CVA)    Aortic valve stenosis - Severe; pending evaluation for TAVR (delayed due to CVA followign LHC & now 2 hospitalizations for ?PNA & PE.  does not sound like patient was recently symptomatic with this prior to admission. Reports good progress with PT at [facility]. Can defer decision about TAVR referral to outpatient    Elevated troponin / Demand ischemia - likely related to PE, Severe AS & notable drop in EF (this level of Troponin does not correlate with an MI leading to the severity of LV dysfunction)    Chronic diastolic CHF (congestive heart failure) (HCC) / Valvular cardiomyopathy ([place])- now with newly noted severe Systolic Dysfunction.  Severity in drop in EF is worrisome although he denies recent ischemic-type symptoms. Recent fluctuating EF noted - 50-55% by echo 11/24/15, 30-35% by cath 12/12/15 (when anatomy was stable), 55-60% by TEE 01/12/16, then 25-30% by echo  02/05/16. Recent cath 11/2015 was stable. No further acute workup planned this admission.   Acute on chronic now combined CHF with right heart failure in setting of PE  Continue IV Lasix (BNP on 2/14 remains severely elevated)  Still with brisk I/O (-~2300 overninght); Now Total out ~4.6 L  Will increase Toprol to home dose 50 mg & restart low dose ARB this PM as renal Fxn improved.     Bilateral pulmonary embolism (HCC) - On IV Heparin.  Hgb now stable x 2 days post transfusion  Would like to start PO anticoagulation (difficult decision with increased LFTs making warfarin less appealing, but probably still the best option)  Microcytic Anemia ? due to acute blood loss - s/p 2 units PRBC - Hgb stable; feels much better after transfusion    Hyperlipidemia with target LDL less than 70 - with elevated LFTs, not on statin    Essential hypertension - stable BP; Able to tolerate Toprol   Increase  Toprol to 50 mg this AM  Restart ARB this PM @ 25 mg (1/2 home dose).                                                     DM2 (diabetes mellitus, type 2) ([place]) - per PCCM   COPD, moderate ([place]) - Per PCCM   Acute kidney failure ([place]) - now Cr 1.21, continues to improve       Leg DVT (deep venous thromboembolism), acute ([place]) - on IV Heparin   Transaminitis - levels go up & down -- RUQ [place] ordered, palpable Liver - ? Congestive hepatopathy, but with PE also need to consider portal or hepatic vein thrombosis  Also has low albumin levels - ? Protein malnutrition --> need nutrition assessment.    Hypokalemia - Replaced per PCCM   PVCs. - less notable with BB, increased to 50 mg  Many with bigeminy.  On toprol XL 25 daily    Will need to reassess limited Echo in ~1 month or so to reassess EF.    LOS: 5 days    Leonie Man, M.D., M.S. Interventional Cardiologist   Pager # [phone] Phone # [phone] [address] [place], [place] [address]

## 2016-02-08 NOTE — Progress Notes (Signed)
Date: February 08, 2016 Chart reviewed for concurrent status and case management needs. Will continue to follow patient for changes and needs: Rhonda Davis, BSN, RN, CCM   [phone] 

## 2016-02-08 NOTE — Progress Notes (Signed)
[place] for IV heparin Indication: VTE Treatment  Allergies  Allergen Reactions  . Mold Extract [Trichophyton] Anaphylaxis  . Lipitor [Atorvastatin] Other (See Comments)    Weakness in legs/myalgias    Patient Measurements: Height: 6' (182.9 cm) Weight: 180 lb 1.9 oz (81.7 kg) IBW/kg (Calculated) : 77.6 Heparin Dosing Weight: 82.7kg  Vital Signs: Temp: 97.5 F (36.4 C) (02/16 0706) Temp Source: Oral (02/16 0706) BP: 140/65 mmHg (02/16 0600) Pulse Rate: 85 (02/16 0600)  Labs:  Recent Labs  02/06/16 0334 02/07/16 0630 02/08/16 0311 02/08/16 0331  HGB 7.4* 9.1* 9.4*  --   HCT 24.2* 29.4* 28.9*  --   PLT 283 262 274  --   LABPROT  --   --  19.1*  --   INR  --   --  1.61*  --   HEPARINUNFRC 0.46 0.45  --  0.38  CREATININE 1.29* 1.27* 1.22  --     Estimated Creatinine Clearance: 48.6 mL/min (by C-G formula based on Cr of 1.22).   Assessment: 27 yoM with COPD on home 02, CAD, CKD, HTN, severe aortic stenosis and recent hospitalization for staph bacteremia and HCAP presents 2/11 with SOB and cough found to have recurrent right-sided PNA and probably parapneumonic effusion with elevated LA and tachycardia, started on antibiotics.  Dopplers now positive for DVT in peroneal veins, left distal popliteal vein, left soleal veins, and left peroneal veins.  VQ scan reveals BL PE (categorized as at least submassive with R heart strain noted). Pharmacy consulted to start IV heparin for VTE treatment.   PTA on plavix (?not aspirin) though pt reports not taking medication.  Resumed inpatient. Stopped 2/14 by TRH  Today, 02/08/2016:.  Heparin level remains therapeutic on 1250 units/hr  CBC: Hgb improved s/p PRBC 2/14, pltc WNL  INR 2/16 = 1.61 w/o anticoagulation that effects PT/INR  CTA 2/12 revealed BL PE with evidence of R heart strain  2/13 ECHO reveals: EF = 20-25% with mod AS, RV with moderately reduced systolic fx and moderate  pulmonary HTN  Troponin elevated - likely demand ischemia per cardiology  SCr - improved to WNL, resume ARB 2/  LFTs trending up - worsening again . ? Related to hepatic congestion from R-sided HF d/t PE.  Statin was/ stopped 2/13 No bleeding noted  Goal of Therapy:  Heparin level 0.3-0.7 units/ml Monitor platelets by anticoagulation protocol: Yes   Plan:   Continue heparin at 1250 units/hr  Daily heparin level and CBC.   Current plan is to change to warfarin when appropriate.  Would likely need to be conservative due to elevated LFTs   Doreene Eland, PharmD, BCPS.   Pager: [phone] 02/08/2016 9:36 AM

## 2016-02-08 NOTE — Progress Notes (Signed)
[facility] PROGRESS NOTE  Nijee Frisbey [id number] DOB: [birth year] DOA: 02/03/2016 PCP: Limmie Patricia, MD  Summary 02/07/16: I have seen and examined Mr Cicale at bedside and reviewed his chart. Appreciate cardiology/pulmonary. Kshaun Gargan is a very pleasant 82 year old male being treated for acute respiratory failure with hypoxia resulting from pulmonary embolism/?HCAP/pleural effusion. He has required PRBC transfusion. He is on heparin/broad-spectrum antibiotics. There is no overt bleeding. Anemia concerning his findings long-term anticoagulation is concerned. Will continue anticoagulation/empiric antibiotics and monitor for further evidence of bleeding. Patient still slightly dyspneic therefore will keep in SDU. 02/08/16: [place] [facility]ology. Patient still has cough and this is concerning. His hemoglobin is reassuringly stable. Cause of transaminitis not clear, abdominal ultrasound pending, agree with holding off on transitioning to oral anticoagulant in view of transaminitis. Will give low-dose aspirin for CVA as patient will be off Plavix. We'll continue antibiotics to complete course as recommended by pulmonary. Patient stable for transfer to telemetry when bed becomes available. Plan Acute respiratory failure with hypoxia (HCC)/COPD, moderate (HCC)/Sepsis (HCC)/Transaminitis/HCAP (healthcare-associated pneumonia)/Pleural effusion on right  Continue Cefepime/Zithromax to complete course of antibiotics  Oxygen supplementation/bronchodilators as needed  Follow pulmonary recommendations-?Pleural tap if worsening dyspnea  Follow abdominal ultrasound CAD (coronary artery disease) of artery bypass graft/Aortic valve stenosis/acute on Chronic diastolic CHF (congestive heart failure) (HCC) EF 30%/Valvular cardiomyopathy (HCC)/Essential hypertension  EF 30%  On Lasix/Lopressor/losartan  Follow cardiology recommendations Bilateral pulmonary embolism (HCC)/DVT of lower  extremity, bilateral (HCC)/Leg DVT (deep venous thromboembolism), acute ([place])  Patient on heparin per pharmacy. Appreciate pharmacy help.  Will continue heparin for now and monitor hemoglobin, then eventually transition to oral anticoagulation(Coumadin) if no evidence of further blood loss, and transaminitis improves Normocytic anemia/Anemia due to acute blood loss  Monitor for evidence of bleeding  Transfuse PRBC as needed  May need further investigations if recurrent blood loss Hyperlipidemia with target LDL less than 70/DM2 (diabetes mellitus, type 2) (HCC)/GERD (gastroesophageal reflux disease)  Hemoglobin A1c 6.8  Sugars generally controlled  Continue Lantus/SSI  PPI  Statins on hold in view of transaminitis Acute kidney failure ([place])  Likely due to a combination of factors including diuretics  Appears stable   Monitor bun/cr History of CVA  Low-dose aspirin  Statins on hold due to transaminitis Code Status: DNR Family Communication: None at bedside Disposition Plan: Likely SNF weekend or early next week. Transfer to telemetry when bed becomes available  Consultants:  Pulmonary  Cardiology  Procedures:  prbc transufion  Antibiotics:  Cefepime  Zithromax  HPI/Subjective: Has hacking cough. No other complaints.  Objective: Filed Vitals:   02/08/16 0600 02/08/16 0706  BP: 140/65   Pulse: 85   Temp:  97.5 F (36.4 C)  Resp: 33     Intake/Output Summary (Last 24 hours) at 02/08/16 0940 Last data filed at 02/08/16 0530  Gross per 24 hour  Intake    865 ml  Output   3200 ml  Net  -2335 ml   Filed Weights   02/05/16 0655 02/06/16 1321 02/07/16 0500  Weight: 82.1 kg (181 lb) 91.7 kg (202 lb 2.6 oz) 81.7 kg (180 lb 1.9 oz)    Exam:   General:  Comfortable at rest. Coughing.  Cardiovascular: S1-S2 normal. No murmurs. Pulse regular.  Respiratory: Good air entry bilaterally. No rhonchi or rales.  Abdomen: Soft and nontender. Normal  bowel sounds. No organomegaly.  Musculoskeletal: No pedal edema   Neurological: Intact  Data Reviewed: Basic Metabolic Panel:  Recent Labs Lab 02/04/16 0416 02/05/16 0150 02/06/16  [id number] 02/07/16 0630 02/08/16 0311  NA 137 136 137 136 136  K 5.0 4.2 4.2 3.4* 3.7  CL 109 108 109 104 104  CO2 17* 18* 19* 21* 23  GLUCOSE 229* 206* 199* 149* 152*  BUN 24* 25* 31* 33* 36*  CREATININE 1.40* 1.39* 1.29* 1.27* 1.22  CALCIUM 7.8* 7.7* 8.1* 8.3* 8.1*  MG  --  2.2  --  1.9 1.9  PHOS  --   --   --   --  2.4*   Liver Function Tests:  Recent Labs Lab 02/03/16 1310 02/04/16 0416 02/05/16 0150 02/06/16 0815 02/07/16 0630  AST 46* 129* 687* 291* 615*  ALT 39 70* 336* 290* 429*  ALKPHOS 138* 143* 267* 335* 478*  BILITOT 1.2 0.9 0.6 0.5 0.8  PROT 8.1 6.8 6.5 6.7 7.0  ALBUMIN 2.9* 2.3* 2.2* 2.3* 2.3*   No results for input(s): LIPASE, AMYLASE in the last 168 hours. No results for input(s): AMMONIA in the last 168 hours. CBC:  Recent Labs Lab 02/03/16 1310 02/04/16 0416 02/05/16 0150 02/06/16 0334 02/07/16 0630 02/08/16 0311  WBC 18.5* 13.1* 9.5 7.2 6.4 7.3  NEUTROABS 16.9*  --  7.8*  --   --   --   HGB 9.1* 8.1* 7.6* 7.4* 9.1* 9.4*  HCT 29.2* 25.9* 24.7* 24.2* 29.4* 28.9*  MCV 89.3 89.3 89.5 90.3 88.0 87.6  PLT 318 229 261 283 262 274   Cardiac Enzymes:  Recent Labs Lab 02/04/16 1435 02/04/16 1931 02/05/16 0150  TROPONINI 1.05* 1.04* 0.96*   BNP (last 3 results)  Recent Labs  01/09/16 0704 02/04/16 1435 02/06/16 0815  BNP 338.3* 1553.6* 1933.8*    ProBNP (last 3 results) No results for input(s): PROBNP in the last 8760 hours.  CBG:  Recent Labs Lab 02/07/16 0758 02/07/16 1159 02/07/16 1612 02/07/16 2206 02/08/16 0823  GLUCAP 142* 199* 174* 163* 130*    Recent Results (from the past 240 hour(s))  Culture, blood (routine x 2)     Status: None   Collection Time: 02/03/16  1:10 PM  Result Value Ref Range Status   Specimen Description BLOOD  BLOOD LEFT FOREARM  Final   Special Requests BOTTLES DRAWN AEROBIC AND ANAEROBIC 5CC  Final   Culture   Final    NO GROWTH 5 DAYS Performed at [facility]    Report Status 02/08/2016 FINAL  Final  Culture, blood (routine x 2)     Status: None   Collection Time: 02/03/16  1:12 PM  Result Value Ref Range Status   Specimen Description BLOOD BLOOD LEFT FOREARM  Final   Special Requests IN PEDIATRIC BOTTLE 3CC  Final   Culture   Final    NO GROWTH 5 DAYS Performed at [facility]    Report Status 02/08/2016 FINAL  Final  MRSA PCR Screening     Status: None   Collection Time: 02/03/16  6:53 PM  Result Value Ref Range Status   MRSA by PCR NEGATIVE NEGATIVE Final    Comment:        The GeneXpert MRSA Assay (FDA approved for NASAL specimens only), is one component of a comprehensive MRSA colonization surveillance program. It is not intended to diagnose MRSA infection nor to guide or monitor treatment for MRSA infections.      Studies: No results found.  Scheduled Meds: . arformoterol  15 mcg Nebulization BID  . azithromycin  500 mg Intravenous Daily  . budesonide (PULMICORT) nebulizer solution  0.25 mg Nebulization  BID  . ceFEPime (MAXIPIME) IV  2 g Intravenous Q24H  . docusate sodium  100 mg Oral BID  . feeding supplement (PRO-STAT SUGAR FREE 64)  30 mL Oral BID  . ferrous sulfate  325 mg Oral BID WC  . fluticasone  2 spray Each Nare Daily  . furosemide  40 mg Intravenous Q12H  . guaiFENesin  1,200 mg Oral BID  . insulin aspart  0-15 Units Subcutaneous TID WC  . insulin glargine  28 Units Subcutaneous Daily  . ipratropium  0.5 mg Nebulization TID  . latanoprost  1 drop Both Eyes QHS  . levalbuterol  0.63 mg Nebulization TID  . loratadine  10 mg Oral Daily  . losartan  25 mg Oral Daily  . metoprolol succinate  50 mg Oral Daily  . oxybutynin  5 mg Oral Daily  . pantoprazole  40 mg Oral Q0600  . protein supplement shake  8 oz Oral BID BM  . sodium  chloride flush  3 mL Intravenous Q12H   Continuous Infusions: . heparin 1,250 Units/hr (02/08/16 0600)     Time spent: 25 minutes    Najib Colmenares  [facility] Pager [phone]. If 7PM-7AM, please contact night-coverage at [email or web address], password [facility] 02/08/2016, 9:40 AM  LOS: 5 days

## 2016-02-09 ENCOUNTER — Ambulatory Visit: Payer: Medicare Other | Admitting: Physical Therapy

## 2016-02-09 LAB — COMPREHENSIVE METABOLIC PANEL
ALK PHOS: 356 U/L — AB (ref 38–126)
ALT: 189 U/L — AB (ref 17–63)
ANION GAP: 10 (ref 5–15)
AST: 75 U/L — ABNORMAL HIGH (ref 15–41)
Albumin: 2.2 g/dL — ABNORMAL LOW (ref 3.5–5.0)
BUN: 36 mg/dL — ABNORMAL HIGH (ref 6–20)
CALCIUM: 8.6 mg/dL — AB (ref 8.9–10.3)
CO2: 27 mmol/L (ref 22–32)
CREATININE: 1.16 mg/dL (ref 0.61–1.24)
Chloride: 102 mmol/L (ref 101–111)
GFR, EST NON AFRICAN AMERICAN: 56 mL/min — AB (ref >60)
Glucose, Bld: 117 mg/dL — ABNORMAL HIGH (ref 65–99)
Potassium: 3 mmol/L — ABNORMAL LOW (ref 3.5–5.1)
Sodium: 139 mmol/L (ref 135–145)
TOTAL PROTEIN: 6.9 g/dL (ref 6.5–8.1)
Total Bilirubin: 0.7 mg/dL (ref 0.3–1.2)

## 2016-02-09 LAB — HEPARIN LEVEL (UNFRACTIONATED): Heparin Unfractionated: 0.37 IU/mL (ref 0.30–0.70)

## 2016-02-09 LAB — GLUCOSE, CAPILLARY
GLUCOSE-CAPILLARY: 167 mg/dL — AB (ref 65–99)
GLUCOSE-CAPILLARY: 108 mg/dL — AB (ref 65–99)
GLUCOSE-CAPILLARY: 246 mg/dL — AB (ref 65–99)
Glucose-Capillary: 130 mg/dL — ABNORMAL HIGH (ref 65–99)

## 2016-02-09 LAB — CBC
HCT: 29.4 % — ABNORMAL LOW (ref 39.0–52.0)
Hemoglobin: 9.4 g/dL — ABNORMAL LOW (ref 13.0–17.0)
MCH: 28.1 pg (ref 26.0–34.0)
MCHC: 32 g/dL (ref 30.0–36.0)
MCV: 87.8 fL (ref 78.0–100.0)
PLATELETS: 315 10*3/uL (ref 150–400)
RBC: 3.35 MIL/uL — AB (ref 4.22–5.81)
RDW: 19.7 % — ABNORMAL HIGH (ref 11.5–15.5)
WBC: 6.8 10*3/uL (ref 4.0–10.5)

## 2016-02-09 MED ORDER — WARFARIN - PHARMACIST DOSING INPATIENT: Freq: Every day | Status: DC

## 2016-02-09 MED ORDER — WARFARIN SODIUM 2.5 MG PO TABS
2.5000 mg | ORAL_TABLET | Freq: Once | ORAL | Status: AC
  Administered 2016-02-09: 2.5 mg via ORAL
  Filled 2016-02-09: qty 1

## 2016-02-09 MED ORDER — DOXYCYCLINE HYCLATE 100 MG PO TABS
100.0000 mg | ORAL_TABLET | Freq: Two times a day (BID) | ORAL | Status: DC
  Administered 2016-02-09: 100 mg via ORAL
  Filled 2016-02-09 (×3): qty 1

## 2016-02-09 MED ORDER — POTASSIUM CHLORIDE 20 MEQ/15ML (10%) PO SOLN
40.0000 meq | Freq: Once | ORAL | Status: AC
  Administered 2016-02-09: 40 meq via ORAL
  Filled 2016-02-09: qty 30

## 2016-02-09 NOTE — Progress Notes (Signed)
[facility] PROGRESS NOTE  Douglas Carrillo [id number] DOB: Jul 06, 1928 DOA: 02/03/2016 PCP: Limmie Patricia, MD  Summary 02/07/16: I have seen and examined Douglas Carrillo at bedside and reviewed his chart. Appreciate cardiology/pulmonary. Fannie Monastra is a very pleasant 82 year old male being treated for acute respiratory failure with hypoxia resulting from pulmonary embolism/?HCAP/pleural effusion. He has required PRBC transfusion. He is on heparin/broad-spectrum antibiotics. There is no overt bleeding. Anemia concerning his findings long-term anticoagulation is concerned. Will continue anticoagulation/empiric antibiotics and monitor for further evidence of bleeding. Patient still slightly dyspneic therefore will keep in SDU. 02/08/16: [facility]. Patient still has cough and this is concerning. His hemoglobin is reassuringly stable. Cause of transaminitis not clear, abdominal ultrasound pending, agree with holding off on transitioning to oral anticoagulant in view of transaminitis. Will give low-dose aspirin for CVA as patient will be off Plavix. We'll continue antibiotics to complete course as recommended by pulmonary. Patient stable for transfer to telemetry when bed becomes available. 02/09/16: [place] [facility]. Hemoglobin remains stable. Will start Coumadin per pharmacy for target INR 2-3. Transaminitis improved, abdominal ultrasound unrevealing. Transaminitis may be related to medications/infection/congestive hepatopathy. Will give patient doxycycline instead of Zithromax in view of persistent cough. Plan Acute respiratory failure with hypoxia (HCC)/COPD, moderate (HCC)/Sepsis (HCC)/Transaminitis/HCAP (healthcare-associated pneumonia)/Pleural effusion on right  Continue Cefepime, discontinue Zithromax and start doxycycline to give for 3 days.  Oxygen supplementation/bronchodilators as needed CAD (coronary artery disease) of artery bypass graft/Aortic valve stenosis/acute on  Chronic diastolic CHF (congestive heart failure) (HCC) EF 30%/Valvular cardiomyopathy (HCC)/Essential hypertension  EF 30%  On Lasix/Lopressor/losartan  Follow cardiology recommendations Bilateral pulmonary embolism (HCC)/DVT of lower extremity, bilateral (HCC)/Leg DVT (deep venous thromboembolism), acute ([place])  Patient on heparin per pharmacy. Appreciate pharmacy help.  Will start Coumadin per pharmacy for target INR 2-3, and continue to monitor for suggestion of acute blood loss Normocytic anemia/Anemia due to acute blood loss  Hemoglobin stable  Monitor for evidence of bleeding  Transfuse PRBC as needed  May need further investigations if recurrent blood loss Hyperlipidemia with target LDL less than 70/DM2 (diabetes mellitus, type 2) (HCC)/GERD (gastroesophageal reflux disease)  Hemoglobin A1c 6.8  Sugars generally controlled  Continue Lantus/SSI  PPI  Statins on hold in view of transaminitis Acute kidney failure ([place])  Likely due to a combination of factors including diuretics  Appears stable  Monitor bun/cr History of CVA  Low-dose aspirin  Statins on hold due to transaminitis Code Status: DNR Family Communication: Daughter at bedside Disposition Plan: Likely SNFearly next week.   Consultants:  Pulmonary  Cardiology  Procedures:  prbc transufion  Antibiotics:  Cefepime  Zithromax>>02/09/16  Doxycycline 02/09/16>>  HPI/Subjective: Feels okay. Still has cough.  Objective: Filed Vitals:   02/09/16 0646 02/09/16 1522  BP: 131/57 102/50  Pulse: 86 91  Temp: 98 F (36.7 C) 97.8 F (36.6 C)  Resp: 20 20    Intake/Output Summary (Last 24 hours) at 02/09/16 1743 Last data filed at 02/09/16 1011  Gross per 24 hour  Intake    240 ml  Output   2600 ml  Net  -2360 ml   Filed Weights   02/08/16 1400 02/08/16 1925 02/09/16 0600  Weight: 84.2 kg (185 lb 10 oz) 81.9 kg (180 lb 8.9 oz) 83.4 kg (183 lb 13.8 oz)    Exam:   General:   Comfortable at rest.  Cardiovascular: S1-S2 normal. No murmurs. Pulse regular.  Respiratory: Good air entry bilaterally. No rhonchi or rales.  Abdomen: Soft and nontender. Normal bowel sounds.  No organomegaly.  Musculoskeletal: No pedal edema   Neurological: Intact  Data Reviewed: Basic Metabolic Panel:  Recent Labs Lab 02/05/16 0150 02/06/16 0334 02/07/16 0630 02/08/16 0311 02/09/16 0746  NA 136 137 136 136 139  K 4.2 4.2 3.4* 3.7 3.0*  CL 108 109 104 104 102  CO2 18* 19* 21* 23 27  GLUCOSE 206* 199* 149* 152* 117*  BUN 25* 31* 33* 36* 36*  CREATININE 1.39* 1.29* 1.27* 1.22 1.16  CALCIUM 7.7* 8.1* 8.3* 8.1* 8.6*  MG 2.2  --  1.9 1.9  --   PHOS  --   --   --  2.4*  --    Liver Function Tests:  Recent Labs Lab 02/05/16 0150 02/06/16 0815 02/07/16 0630 02/08/16 0510 02/09/16 0746  AST 687* 291* 615* 34 75*  ALT 336* 290* 429* 13* 189*  ALKPHOS 267* 335* 478* 48 356*  BILITOT 0.6 0.5 0.8 1.0 0.7  PROT 6.5 6.7 7.0 5.3* 6.9  ALBUMIN 2.2* 2.3* 2.3* 3.1* 2.2*   No results for input(s): LIPASE, AMYLASE in the last 168 hours. No results for input(s): AMMONIA in the last 168 hours. CBC:  Recent Labs Lab 02/03/16 1310  02/05/16 0150 02/06/16 0334 02/07/16 0630 02/08/16 0311 02/09/16 0557  WBC 18.5*  < > 9.5 7.2 6.4 7.3 6.8  NEUTROABS 16.9*  --  7.8*  --   --   --   --   HGB 9.1*  < > 7.6* 7.4* 9.1* 9.4* 9.4*  HCT 29.2*  < > 24.7* 24.2* 29.4* 28.9* 29.4*  MCV 89.3  < > 89.5 90.3 88.0 87.6 87.8  PLT 318  < > 261 283 262 274 315  < > = values in this interval not displayed. Cardiac Enzymes:  Recent Labs Lab 02/04/16 1435 02/04/16 1931 02/05/16 0150  TROPONINI 1.05* 1.04* 0.96*   BNP (last 3 results)  Recent Labs  01/09/16 0704 02/04/16 1435 02/06/16 0815  BNP 338.3* 1553.6* 1933.8*    ProBNP (last 3 results) No results for input(s): PROBNP in the last 8760 hours.  CBG:  Recent Labs Lab 02/08/16 1657 02/08/16 2213 02/09/16 0753  02/09/16 1136 02/09/16 1709  GLUCAP 123* 137* 108* 246* 130*    Recent Results (from the past 240 hour(s))  Culture, blood (routine x 2)     Status: None   Collection Time: 02/03/16  1:10 PM  Result Value Ref Range Status   Specimen Description BLOOD BLOOD LEFT FOREARM  Final   Special Requests BOTTLES DRAWN AEROBIC AND ANAEROBIC 5CC  Final   Culture   Final    NO GROWTH 5 DAYS Performed at [facility]    Report Status 02/08/2016 FINAL  Final  Culture, blood (routine x 2)     Status: None   Collection Time: 02/03/16  1:12 PM  Result Value Ref Range Status   Specimen Description BLOOD BLOOD LEFT FOREARM  Final   Special Requests IN PEDIATRIC BOTTLE 3CC  Final   Culture   Final    NO GROWTH 5 DAYS Performed at [facility]    Report Status 02/08/2016 FINAL  Final  MRSA PCR Screening     Status: None   Collection Time: 02/03/16  6:53 PM  Result Value Ref Range Status   MRSA by PCR NEGATIVE NEGATIVE Final    Comment:        The GeneXpert MRSA Assay (FDA approved for NASAL specimens only), is one component of a comprehensive MRSA colonization surveillance program. It  is not intended to diagnose MRSA infection nor to guide or monitor treatment for MRSA infections.   Respiratory virus panel     Status: None   Collection Time: 02/05/16  9:13 AM  Result Value Ref Range Status   Respiratory Syncytial Virus A Negative Negative Final   Respiratory Syncytial Virus B Negative Negative Final   Influenza A Negative Negative Final   Influenza B Negative Negative Final   Parainfluenza 1 Negative Negative Final   Parainfluenza 2 Negative Negative Final   Parainfluenza 3 Negative Negative Final   Metapneumovirus Negative Negative Final   Rhinovirus Negative Negative Final   Adenovirus Negative Negative Final    Comment: (NOTE) Performed At: [facility] [place], [place] [id number] Lindon Romp MD [id number]      Studies: [place]  Art/ven Flow Abd Pelv Doppler  02/08/2016  CLINICAL DATA:  Acute liver failure. EXAM: DUPLEX ULTRASOUND OF LIVER TECHNIQUE: Color and duplex Doppler ultrasound was performed to evaluate the hepatic in-flow and out-flow vessels. COMPARISON:  None. FINDINGS: Portal Vein Velocities Main:  27 cm/sec Right:  25.8 cm/sec Left:  18 cm/sec Normal hepatopetal flow is noted in the portal veins. Hepatic Vein Velocities Right:  51.4 cm/sec Middle:  47.3 cm/sec Left:  67.2 cm/sec Normal hepatofugal flow is noted in hepatic veins. Hepatic Artery Velocity:  73.4 cm/sec Splenic Vein Velocity:  30.9 cm/sec Varices: Absent. Ascites: Absent. Spleen measures 9.2 x 6.9 x 3.3 cm in size. IMPRESSION: No evidence of portal, hepatic or splenic venous thrombosis or occlusion. Electronically Signed   By: Marijo Conception, M.D.   On: 02/08/2016 15:17   US Abdomen Limited Ruq  02/08/2016  CLINICAL DATA:  Acute liver failure. Inpatient. Elevated liver function tests. Prostate cancer. EXAM: US ABDOMEN LIMITED - RIGHT UPPER QUADRANT COMPARISON:  01/17/2006 CT abdomen/pelvis. FINDINGS: Gallbladder: No gallstones or wall thickening visualized. No sonographic Murphy sign noted by sonographer. Common bile duct: Diameter: 4 mm Liver: No focal lesion identified. Within normal limits in parenchymal echogenicity. Incidentally noted is a small right pleural effusion. IMPRESSION: 1. Incidental small right pleural effusion. 2. Otherwise normal right upper quadrant abdominal sonogram, with no cholelithiasis, no biliary ductal dilatation and sonographically normal liver. Electronically Signed   By: Ilona Sorrel M.D.   On: 02/08/2016 15:14    Scheduled Meds: . arformoterol  15 mcg Nebulization BID  . aspirin  81 mg Oral Daily  . budesonide (PULMICORT) nebulizer solution  0.25 mg Nebulization BID  . ceFEPime (MAXIPIME) IV  2 g Intravenous Q24H  . docusate sodium  100 mg Oral BID  . doxycycline  100 mg Oral Q12H  . feeding supplement (PRO-STAT SUGAR  FREE 64)  30 mL Oral BID  . ferrous sulfate  325 mg Oral BID WC  . fluticasone  2 spray Each Nare Daily  . furosemide  40 mg Intravenous Q12H  . guaiFENesin  1,200 mg Oral BID  . insulin aspart  0-15 Units Subcutaneous TID WC  . insulin glargine  28 Units Subcutaneous Daily  . ipratropium  0.5 mg Nebulization TID  . latanoprost  1 drop Both Eyes QHS  . levalbuterol  0.63 mg Nebulization TID  . loratadine  10 mg Oral Daily  . losartan  25 mg Oral Daily  . metoprolol succinate  50 mg Oral Daily  . oxybutynin  5 mg Oral Daily  . pantoprazole  40 mg Oral Q0600  . protein supplement shake  8 oz Oral BID BM  .  sodium chloride flush  3 mL Intravenous Q12H   Continuous Infusions: . heparin 1,250 Units/hr (02/09/16 1349)     Time spent: 25 minutes    Aren Cherne  [facility] Pager [phone]. If 7PM-7AM, please contact night-coverage at [email or web address], password [facility] 02/09/2016, 5:43 PM  LOS: 6 days

## 2016-02-09 NOTE — Progress Notes (Signed)
[place] for IV heparin/warfarin Indication: VTE Treatment  Allergies  Allergen Reactions  . Mold Extract [Trichophyton] Anaphylaxis  . Lipitor [Atorvastatin] Other (See Comments)    Weakness in legs/myalgias    Patient Measurements: Height: 6' (182.9 cm) Weight: 183 lb 13.8 oz (83.4 kg) IBW/kg (Calculated) : 77.6 Heparin Dosing Weight: 82.7kg  Vital Signs: Temp: 97.8 F (36.6 C) (02/17 1522) Temp Source: Oral (02/17 1522) BP: 102/50 mmHg (02/17 1522) Pulse Rate: 91 (02/17 1522)  Labs:  Recent Labs  02/07/16 0630 02/08/16 0311 02/08/16 0331 02/09/16 0557 02/09/16 0746  HGB 9.1* 9.4*  --  9.4*  --   HCT 29.4* 28.9*  --  29.4*  --   PLT 262 274  --  315  --   LABPROT  --  19.1*  --   --   --   INR  --  1.61*  --   --   --   HEPARINUNFRC 0.45  --  0.38 0.37  --   CREATININE 1.27* 1.22  --   --  1.16    Estimated Creatinine Clearance: 51.1 mL/min (by C-G formula based on Cr of 1.16).   Assessment: 3 yoM with COPD on home 02, CAD, CKD, HTN, severe aortic stenosis and recent hospitalization for staph bacteremia and HCAP presents 2/11 with SOB and cough found to have recurrent right-sided PNA and probably parapneumonic effusion with elevated LA and tachycardia, started on antibiotics.  Dopplers now positive for DVT in peroneal veins, left distal popliteal vein, left soleal veins, and left peroneal veins.  VQ scan reveals BL PE (categorized as at least submassive with R heart strain noted). Pharmacy consulted to start IV heparin for VTE treatment.  On 2/17 Pharmacy is also consulted to dose warfarin.  PTA on plavix (?not aspirin) though pt reports not taking medication.  Resumed inpatient. Stopped 2/14 by [facility]  Today, 02/09/2016:.  Heparin level remains therapeutic (0.37) on 1250 units/hr  CBC: Hgb improved and stable s/p PRBC 2/14, pltc WNL  INR 2/16 = 1.61 w/o anticoagulation that affects PT/INR  CTA 2/12 revealed BL PE with  evidence of R heart strain  2/13 ECHO reveals: EF = 20-25% with mod AS, RV with moderately reduced systolic fx and moderate pulmonary HTN  Troponin elevated - likely demand ischemia per cardiology  SCr - improved to WNL, resume ARB 2/  LFTs significantly elevated earlier this admission but now WNL. ? Related to hepatic congestion from R-sided HF d/t PE.  Statin was/ stopped 2/13  No bleeding noted  Goal of Therapy:  Heparin level 0.3-0.7 units/ml Monitor platelets by anticoagulation protocol: Yes   Plan:   Continue heparin at 1250 units/hr  Daily heparin level and CBC.   As INR is slightly high without any medications that affect PT/INR, will start at lower dose of warfarin 2.5 mg PO x 1 .   Royetta Asal, PharmD, BCPS Pager [phone] 02/09/2016 6:20 PM

## 2016-02-09 NOTE — Progress Notes (Signed)
CSW met with pt / daughter, [name], to assist with d/c planning. SNF bed offers provided. Pt / daughter are interested in [facility]. SNF has not yet provided a bed offer. CSW has contacted [facility] and message left for Admissions Coordinator to contact CSW. Awaiting return call. CSW will continue to follow to asist with d/c planning to SNF when stable.  Werner Lean LCSW [phone]

## 2016-02-09 NOTE — Progress Notes (Signed)
Nutrition Follow-up  DOCUMENTATION CODES:   Severe malnutrition in context of chronic illness  INTERVENTION:  - Continue Prostat BID and Premier Protein BID - RD will continue to monitor for needs  NUTRITION DIAGNOSIS:   Malnutrition related to chronic illness as evidenced by percent weight loss, energy intake < or equal to 75% for > or equal to 1 month. -ongoing  GOAL:   Patient will meet greater than or equal to 90% of their needs -beginning to meet  MONITOR:   PO intake, Supplement acceptance, Labs, Weight trends, I & O's  ASSESSMENT:   82 y.o. male with the past medical history of hypertension, coronary artery disease, is oxygen dependent COPD, chronic kidney disease, severe aortic stenosis, recent stay in the hospital for Staphylococcus aureus bacteremia and healthcare associated pneumonia, presents from his skilled nursing facility with the complaints of shortness of breath, cough and fever..  Pt moved from SDU to [place] yesterday PM. Per chart review, he ate 50% of breakfast 2/15 with no other intakes documented since that time. Pt reports he had 100% of scrambled eggs and coffee for breakfast this AM. He denies any discomfort with eating and denies chewing or swallowing issues; noted pt with previous hx of CVA. Pt requesting change of diet texture and it was explained to him why current order is in place and that RD is unable to make adjustments to this order.   Pt has Premier Protein and Prostat both ordered BID. He states that he has been consuming these supplements although he does not particularly like them. He states that breathing is much improved today.   Beginning to meet needs. Medications reviewed. Labs reviewed; CBGs: 108-246 mg/dL, K: 3 mmol/L, BUN: 36 mg/dL, Ca: 8.6 mg/dL, Phos: 2.4 mg/dL.    Diet Order:  DIET DYS 2 Room service appropriate?: Yes; Fluid consistency:: Thin; Fluid restriction:: 1500 mL Fluid  Skin:  Reviewed, no issues  Last BM:   2/14  Height:   Ht Readings from Last 1 Encounters:  02/09/16 6' (1.829 m)    Weight:   Wt Readings from Last 1 Encounters:  02/09/16 183 lb 13.8 oz (83.4 kg)    Ideal Body Weight:  80.9 kg  BMI:  Body mass index is 24.93 kg/(m^2).  Estimated Nutritional Needs:   Kcal:  2000-2200  Protein:  95-105g  Fluid:  2-2.2L/day  EDUCATION NEEDS:   No education needs identified at this time     Jarome Matin, RD, LDN Inpatient Clinical Dietitian Pager # [phone] After hours/weekend pager # [phone]

## 2016-02-09 NOTE — Progress Notes (Signed)
Pharmacy Antibiotic Note  Douglas Carrillo is a 82 y.o. male admitted on 02/03/2016 with pneumonia.  Pharmacy has been consulted for vancomycin and cefepime dosing. Patient also has BL PE.  Recently completed course of vancomycin for MRSA bacteremia.  PCT is elevated but improving. Renal function improving.  Day #7 antibiotics  Plan:  Pulmonary recommends 7 days empiric antibiotics - would discontinue today  Continue cefepime 2gm IV q24h  Azithromycin 500mg  IV q24h  Height: 6' (182.9 cm) Weight: 183 lb 13.8 oz (83.4 kg) IBW/kg (Calculated) : 77.6  Temp (24hrs), Avg:98.6 F (37 C), Min:98 F (36.7 C), Max:99.2 F (37.3 C)   Recent Labs Lab 02/03/16 1322 02/03/16 1650 02/03/16 1923 02/03/16 2300 02/04/16 0416 02/05/16 0150 02/06/16 0334 02/07/16 0630 02/08/16 0311 02/09/16 0557  WBC  --   --   --   --  13.1* 9.5 7.2 6.4 7.3 6.8  CREATININE  --   --   --   --  1.40* 1.39* 1.29* 1.27* 1.22  --   LATICACIDVEN 3.04* 1.29 2.5* 1.7  --  1.4  --   --   --   --     Estimated Creatinine Clearance: 48.6 mL/min (by C-G formula based on Cr of 1.22).    Allergies  Allergen Reactions  . Mold Extract [Trichophyton] Anaphylaxis  . Lipitor [Atorvastatin] Other (See Comments)    Weakness in legs/myalgias    Antimicrobials this admission: 2/11 >> vanc >> 2/14 2/11 >> cefepime x8d >> (stop date entered in Epic 2/18) 2/12 >> azithromycin >> (no stop date entered in Epic)  Levels/dose changes this admission: Previous admission 1/21 VT = 14 on 750mg  q12h Previous admission 1/24 VT = 18 on 1gm q12h  Microbiology Results: 1/17 BCx2: MRSA (Vanc MIC < 0.5) 2/11 MRSA PCR negative (positive on prev admission with MRSA bacteremia) 2/11 BCx: NGF 2/13 resp virus panel: neg 2/13 sputum: not collected 2/13 Legionella: negative 2/13 Strep: negative  Thank you for allowing pharmacy to be a part of this patient's care.  Peggyann Juba, PharmD, BCPS Pager: [phone] 02/09/2016 7:30  AM

## 2016-02-09 NOTE — Evaluation (Signed)
Clinical/Bedside Swallow Evaluation Patient Details  Name: Douglas Carrillo MRN: [id number] Date of Birth: July 11, 1928  Today's Date: 02/09/2016 Time: SLP Start Time (ACUTE ONLY): [id number] SLP Stop Time (ACUTE ONLY): 1410 SLP Time Calculation (min) (ACUTE ONLY): 25 min  Past Medical History:  Past Medical History  Diagnosis Date  . Hypertension   . High cholesterol   . Prostate cancer ([place])   . OSA (obstructive sleep apnea)     Not currently on CPAP/uses 2 L/m while sleeping  . COPD (chronic obstructive pulmonary disease) (HCC)     MODERATE  . Diabetes mellitus ([place])   . GERD (gastroesophageal reflux disease)   . Vertigo   . Arthritis     OA  . Anemia   . Chronic diastolic (congestive) heart failure ([place])   . CKD (chronic kidney disease), stage II   . Stroke (cerebrum) ([place])     a. 11/2015 - following cardiac cath.  Marland Kitchen HCAP (healthcare-associated pneumonia)     12/2015  . CAD (coronary artery disease)     a.  s/p CABG 2000. b. [facility] 11/2015: showing patent grafts but +mod-severe pulm HTN with moderately severe AS and EF 30-35%. TAVR consult planned but patient developed post-cath stroke.  . Severe aortic stenosis   . Staphylococcus aureus bacteremia     a. Dx 12/2015.  . Moderate to severe pulmonary hypertension ([place])     a. By [facility] 11/2105.  Marland Kitchen NSVT (nonsustained ventricular tachycardia) ([place])   . Chronic respiratory failure ([place])   . DVT (deep venous thrombosis) ([place])     a. Dx 01/2016 + DVT noted in the right peroneal veins, left distal popliteal vein, left soleal veins, and left peroneal veins.   Past Surgical History:  Past Surgical History  Procedure Laterality Date  . Coronary artery bypass graft  03/29/1999    x 4:  LIMA to the LAD,SVG to the diagonal branch of the LAD,SVG to the intermediate coronary artery & SVG to the posterior descending branch of the RCA.  . Colon surgery      x 4  . Nm myocar perf wall motion  04/28/2012    Low Risk Scan  . Prostate surgery    . Cardiac  catheterization    . Cardiac catheterization N/A 12/12/2015    Procedure: Right/Left Heart Cath and Coronary/Graft Angiography;  Surgeon: Troy Sine, MD;  Location: [facility] CV LAB;  Service: Cardiovascular;  Laterality: N/A;  . Peripheral vascular catheterization N/A 12/12/2015    Procedure: Aortic Arch Angiography;  Surgeon: Troy Sine, MD;  Location: [facility];  Service: Cardiovascular;  Laterality: N/A;  . Eye surgery    . Tee without cardioversion N/A 01/12/2016    Procedure: TRANSESOPHAGEAL ECHOCARDIOGRAM (TEE);  Surgeon: Sanda Klein, MD;  Location: [facility] ENDOSCOPY;  Service: Cardiovascular;  Laterality: N/A;   HPI:  82 y.o. male with the past medical history of hypertension, coronary artery disease, is oxygen dependent COPD, chronic kidney disease, severe aortic stenosis, recent stay in the hospital for Staphylococcus aureus bacteremia and healthcare associated pneumonia, presents from his skilled nursing facility with the complaints of shortness of breath, cough and fever.    Assessment / Plan / Recommendation Clinical Impression  Patient presents with a functional oropharyngeal swallow as per this bedside swallow evaluation. Patient did have MBS during recent past hospital stay, on 01/10/16, which revealed a mild oral dysphagia and suspected primary esophageal dysphagia, and recommended Regular solids, thin liquids at that time. Patient did not exhibit any  overt s/s of aspiration during this bedside swallow evaluation and mastication , oral transit of bolus and swallow initiation were all timely per palpation.     Aspiration Risk  Mild aspiration risk    Diet Recommendation Regular;Thin liquid   Liquid Administration via: Cup;Straw Medication Administration: Whole meds with liquid Supervision: Patient able to self feed;Intermittent supervision to cue for compensatory strategies Compensations: Slow rate;Small sips/bites;Follow solids with liquid Postural Changes:  Seated upright at 90 degrees;Remain upright for at least 30 minutes after po intake    Other  Recommendations Oral Care Recommendations: Oral care BID   Follow up Recommendations  None    Frequency and Duration     N/A       Prognosis        Swallow Study   General Date of Onset: 02/03/16 HPI: 82 y.o. male with the past medical history of hypertension, coronary artery disease, is oxygen dependent COPD, chronic kidney disease, severe aortic stenosis, recent stay in the hospital for Staphylococcus aureus bacteremia and healthcare associated pneumonia, presents from his skilled nursing facility with the complaints of shortness of breath, cough and fever.  Type of Study: Bedside Swallow Evaluation Previous Swallow Assessment: MBS: 01/10/16- mild oral dysphagia and suspected primary esophageal dysphagia, recommended Regular, thin liquids Diet Prior to this Study: Dysphagia 2 (chopped);Thin liquids Temperature Spikes Noted: No Respiratory Status: Room air History of Recent Intubation: No Behavior/Cognition: Alert;Cooperative;Pleasant mood Oral Cavity Assessment: Within Functional Limits Oral Care Completed by SLP: No Oral Cavity - Dentition: Adequate natural dentition;Missing dentition Vision: Functional for self-feeding Self-Feeding Abilities: Able to feed self Patient Positioning: Upright in bed Baseline Vocal Quality: Normal Volitional Cough: Strong Volitional Swallow: Able to elicit    Oral/Motor/Sensory Function Overall Oral Motor/Sensory Function: Mild impairment Facial ROM: Within Functional Limits Facial Symmetry: Within Functional Limits Facial Strength: Within Functional Limits Lingual ROM: Within Functional Limits Lingual Strength: Reduced   Ice Chips     Thin Liquid Thin Liquid: Within functional limits Presentation: Cup;Straw;Self Fed    Nectar Thick     Honey Thick     Puree Puree: Impaired Presentation: Self Fed;Spoon   Solid   GO   Solid: Within  functional limits Presentation: Self Douglas Carrillo 02/09/2016,3:36 PM    Douglas Baller, MA, CCC-SLP 02/09/2016 3:36 PM

## 2016-02-09 NOTE — Progress Notes (Signed)
[place] for IV heparin Indication: VTE Treatment  Allergies  Allergen Reactions  . Mold Extract [Trichophyton] Anaphylaxis  . Lipitor [Atorvastatin] Other (See Comments)    Weakness in legs/myalgias    Patient Measurements: Height: 6' (182.9 cm) Weight: 183 lb 13.8 oz (83.4 kg) IBW/kg (Calculated) : 77.6 Heparin Dosing Weight: 82.7kg  Vital Signs: Temp: 98 F (36.7 C) (02/17 0646) Temp Source: Oral (02/17 0646) BP: 131/57 mmHg (02/17 0646) Pulse Rate: 86 (02/17 0646)  Labs:  Recent Labs  02/07/16 0630 02/08/16 0311 02/08/16 0331 02/09/16 0557  HGB 9.1* 9.4*  --  9.4*  HCT 29.4* 28.9*  --  29.4*  PLT 262 274  --  315  LABPROT  --  19.1*  --   --   INR  --  1.61*  --   --   HEPARINUNFRC 0.45  --  0.38 0.37  CREATININE 1.27* 1.22  --   --     Estimated Creatinine Clearance: 48.6 mL/min (by C-G formula based on Cr of 1.22).   Assessment: 67 yoM with COPD on home 02, CAD, CKD, HTN, severe aortic stenosis and recent hospitalization for staph bacteremia and HCAP presents 2/11 with SOB and cough found to have recurrent right-sided PNA and probably parapneumonic effusion with elevated LA and tachycardia, started on antibiotics.  Dopplers now positive for DVT in peroneal veins, left distal popliteal vein, left soleal veins, and left peroneal veins.  VQ scan reveals BL PE (categorized as at least submassive with R heart strain noted). Pharmacy consulted to start IV heparin for VTE treatment.   PTA on plavix (?not aspirin) though pt reports not taking medication.  Resumed inpatient. Stopped 2/14 by TRH  Today, 02/09/2016:.  Heparin level remains therapeutic (0.37) on 1250 units/hr  CBC: Hgb improved and stable s/p PRBC 2/14, pltc WNL  INR 2/16 = 1.61 w/o anticoagulation that effects PT/INR  CTA 2/12 revealed BL PE with evidence of R heart strain  2/13 ECHO reveals: EF = 20-25% with mod AS, RV with moderately reduced systolic fx  and moderate pulmonary HTN  Troponin elevated - likely demand ischemia per cardiology  SCr - improved to WNL, resume ARB 2/  LFTs significantly elevated earlier this admission but now WNL. ? Related to hepatic congestion from R-sided HF d/t PE.  Statin was/ stopped 2/13  No bleeding noted  Goal of Therapy:  Heparin level 0.3-0.7 units/ml Monitor platelets by anticoagulation protocol: Yes   Plan:   Continue heparin at 1250 units/hr  Daily heparin level and CBC.   Current plan is to change to warfarin when appropriate.   Peggyann Juba, PharmD, BCPS Pager: [phone]  02/09/2016 7:22 AM

## 2016-02-09 NOTE — Care Management Important Message (Signed)
Important Message  Patient Details Important Message  Patient Details IM Letter given to Cookie/Case Manager to Present to Patient Name: Douglas Carrillo MRN: [id number] Date of Birth: [birth year]   Medicare Important Message Given:  Yes    [name] 02/09/2016, 1:03 PM Name: Douglas Carrillo MRN: [id number] Date of Birth: [birth year]   Medicare Important Message Given:  Yes    [name] 02/09/2016, 1:02 PM

## 2016-02-09 NOTE — Evaluation (Signed)
Physical Therapy Evaluation Patient Details Name: Douglas Carrillo MRN: [id number] DOB: [birth year] Today's Date: 02/09/2016   History of Present Illness  82 year old male being treated for acute respiratory failure with hypoxia resulting from pulmonary embolism/HCAP/pleural effusion with PMHx of recent left CVA with residual right deficits, HTN, DM, CAD, CABG, COPD, CKD, and prostate cancer.  Clinical Impression  Pt admitted with above diagnosis. Pt currently with functional limitations due to the deficits listed below (see PT Problem List).  Pt will benefit from skilled PT to increase their independence and safety with mobility to allow discharge to the venue listed below.  Pt fatigues very quickly and currently presents at mod assist (+2) level for mobility.  Pt also with increased coughing with activity.  Pt remained on 3L O2 during session and SpO2 92% post transfer.     Follow Up Recommendations SNF;Supervision/Assistance - 24 hour    Equipment Recommendations  None recommended by PT    Recommendations for Other Services       Precautions / Restrictions Precautions Precautions: Fall Precaution Comments: R sided hemiparesis, monitor sats      Mobility  Bed Mobility Overal bed mobility: Needs Assistance Bed Mobility: Supine to Sit     Supine to sit: Min assist;HOB elevated     General bed mobility comments: slight assist for positioning, pt self assists R LE over EOB  Transfers Overall transfer level: Needs assistance Equipment used: Rolling walker (2 wheeled) Transfers: Sit to/from [facility] Sit to Stand: Mod assist;+2 physical assistance Stand pivot transfers: Mod assist;+2 physical assistance       General transfer comment: assist for rise and steadying as well as controlling descent, pt tends to leave R LE behind so educated daughter and RN to transfer to pt's stronger left side (for getting back into bed); daughter reports residual R sided  weakness post-CVA however this is worse then typical  Ambulation/Gait Ambulation/Gait assistance:  (pt felt too weak and coughing increased with activity)              Stairs            Wheelchair Mobility    Modified Rankin (Stroke Patients Only)       Balance           Standing balance support: Bilateral upper extremity supported Standing balance-Leahy Scale: Poor Standing balance comment: requires RW and external assist                             Pertinent Vitals/Pain Pain Assessment: No/denies pain    Home Living Family/patient expects to be discharged to:: Skilled nursing facility Living Arrangements: Children (since return home from CVA) Available Help at Discharge: Available 24 hours/day;Family Type of Home: House Home Access: Stairs to enter Entrance Stairs-Rails: None Entrance Stairs-Number of Steps: 2 Home Layout: One level Home Equipment: Walker - 2 wheels;Cane - single point      Prior Function Level of Independence: Independent with assistive device(s)   Gait / Transfers Assistance Needed: reports modified independent with RW  ADL's / Homemaking Assistance Needed: reports modified independent        Hand Dominance        Extremity/Trunk Assessment   Upper Extremity Assessment: RUE deficits/detail;Generalized weakness RUE Deficits / Details: at least grossly 3/5 observed         Lower Extremity Assessment: RLE deficits/detail;Generalized weakness RLE Deficits / Details: grossly 2+/5 throughout  Communication   Communication: No difficulties  Cognition Arousal/Alertness: Awake/alert Behavior During Therapy: WFL for tasks assessed/performed Overall Cognitive Status: Within Functional Limits for tasks assessed                      General Comments      Exercises        Assessment/Plan    PT Assessment Patient needs continued PT services  PT Diagnosis Difficulty walking;Generalized  weakness   PT Problem List Decreased strength;Decreased activity tolerance;Decreased balance;Decreased mobility;Cardiopulmonary status limiting activity  PT Treatment Interventions DME instruction;Gait training;Functional mobility training;Therapeutic activities;Therapeutic exercise;Balance training;Patient/family education   PT Goals (Current goals can be found in the Care Plan section) Acute Rehab PT Goals PT Goal Formulation: With patient/family Time For Goal Achievement: 02/23/16 Potential to Achieve Goals: Good    Frequency Min 3X/week   Barriers to discharge        Co-evaluation               End of Session Equipment Utilized During Treatment: Gait belt;Oxygen Activity Tolerance: Patient limited by fatigue Patient left: in chair;with call bell/phone within reach;with chair alarm set;with family/visitor present Nurse Communication: Mobility status         Time: [id number] PT Time Calculation (min) (ACUTE ONLY): 15 min   Charges:   PT Evaluation $PT Eval Low Complexity: 1 Procedure     PT G Codes:        [name],[name] 02/09/2016, 3:17 PM Carmelia Bake, PT, DPT 02/09/2016 Pager: [phone]

## 2016-02-09 NOTE — Progress Notes (Signed)
Subjective: He says he has not coughed in quite some time.  Breathing is a lot better  Objective: Vital signs in last 24 hours: Temp:  [98 F (36.7 C)-99.2 F (37.3 C)] 98 F (36.7 C) (02/17 0646) Pulse Rate:  [80-88] 86 (02/17 0646) Resp:  [20-33] 20 (02/17 0646) BP: (102-147)/(48-77) 131/57 mmHg (02/17 0646) SpO2:  [95 %-100 %] 97 % (02/17 0646) Weight:  [180 lb 8.9 oz (81.9 kg)-185 lb 10 oz (84.2 kg)] 183 lb 13.8 oz (83.4 kg) (02/17 0600) Last BM Date: 02/06/16  Intake/Output from previous day: 02/16 0701 - 02/17 0700 In: -  Out: 4400 [Urine:4400] Intake/Output this shift:    Medications Scheduled Meds: . arformoterol  15 mcg Nebulization BID  . aspirin  81 mg Oral Daily  . azithromycin  500 mg Intravenous Daily  . budesonide (PULMICORT) nebulizer solution  0.25 mg Nebulization BID  . ceFEPime (MAXIPIME) IV  2 g Intravenous Q24H  . docusate sodium  100 mg Oral BID  . feeding supplement (PRO-STAT SUGAR FREE 64)  30 mL Oral BID  . ferrous sulfate  325 mg Oral BID WC  . fluticasone  2 spray Each Nare Daily  . furosemide  40 mg Intravenous Q12H  . guaiFENesin  1,200 mg Oral BID  . insulin aspart  0-15 Units Subcutaneous TID WC  . insulin glargine  28 Units Subcutaneous Daily  . ipratropium  0.5 mg Nebulization TID  . latanoprost  1 drop Both Eyes QHS  . levalbuterol  0.63 mg Nebulization TID  . loratadine  10 mg Oral Daily  . losartan  25 mg Oral Daily  . metoprolol succinate  50 mg Oral Daily  . oxybutynin  5 mg Oral Daily  . pantoprazole  40 mg Oral Q0600  . protein supplement shake  8 oz Oral BID BM  . sodium chloride flush  3 mL Intravenous Q12H   Continuous Infusions: . heparin 1,250 Units/hr (02/08/16 0600)   PRN Meds:.acetaminophen **OR** acetaminophen, HYDROcodone-homatropine, ipratropium, levalbuterol, ondansetron **OR** ondansetron (ZOFRAN) IV, polyvinyl alcohol  PE: General appearance: alert, cooperative and no distress Neck: no JVD Lungs:  Decerased BS. Heart: regular rate and rhythm and 2/6sys MM Abdomen: +BS, soft nontender Extremities: 1+ Right > Left LEE Pulses: 2+ and symmetric Skin: warm and dry Neurologic: Grossly normal  Lab Results:   Recent Labs  02/07/16 0630 02/08/16 0311 02/09/16 0557  WBC 6.4 7.3 6.8  HGB 9.1* 9.4* 9.4*  HCT 29.4* 28.9* 29.4*  PLT 262 274 315   BMET  Recent Labs  02/07/16 0630 02/08/16 0311  NA 136 136  K 3.4* 3.7  CL 104 104  CO2 21* 23  GLUCOSE 149* 152*  BUN 33* 36*  CREATININE 1.27* 1.22  CALCIUM 8.3* 8.1*   PT/INR  Recent Labs  02/08/16 0311  LABPROT 19.1*  INR 1.61*    Assessment/Plan      Acute respiratory failure with hypoxia (HCC)  Improved with diuresis.   DM2 (diabetes mellitus, type 2) (HCC)   Aortic valve stenosis   COPD, moderate (HCC)   Acute kidney failure (HCC)    GERD (gastroesophageal reflux disease)   Sepsis (HCC)   HCAP (healthcare-associated pneumonia)   Stroke (cerebrum) (HCC)   Pleural effusion on right   Transaminitis   Anemia due to acute blood loss  SP two units PRBCs.  Hgb stable this morning.     Valvular cardiomyopathy (HCC)   Acute combined systolic and diastolic heart failure ([place]) -- Reassess  Echo in ~101month  Toprol XL 50 and losartan 25.  Getting lasix 40mg  IV BID.  CMP pending.   Net fluids: -4.4/-9.0L.  Excellent diuresis in the last 24 hours.    He is very close to being euvolemic.  Will see what SCr looks like today.  Stable yesterday.  Likely switch to PO lasix for this evening.  He was on 40mg  PO daily prior to admit.  Should start with BID dosing.     Aortic stenosis, severe  Being considered for TAVR in the OP setting.     Bilateral pulmonary embolism (HCC) -  On IV Heparin. Hgb now stable x 2 days post transfusion Need to start PO anticoagulation with vs. DOAC.  SP right UQ sonogram which was normal.   LFTs have normalized with the exception of a mildly low ALT.     DVT of lower extremity, bilateral  (HCC)  On IV heparin with plan to start coumadin vs. DOAC    Demand ischemia of myocardium (HCC)  Troponin peaked at 2.98.  From PE, AS, and CHF    CAD (coronary artery disease) of artery bypass graft - no active angina & no new stenosis based upon recent cath.  On BB, ARB - statin on hold for now, no Plavix as he will need to be on anticoagulation .   Hyperlipidemia with target LDL less than 70 - crestor on hold 2/2 transaminitis; once liver fxn stabilizes, need to restart Crestor.    Essential hypertension - stable BP on Toprol & Losartan.   PVCs  Increased Toprol helping   Hypokalemia  CMP pending   Normocytic anemia  Stable   Recommend PT evaluation to start getting him moving around.   LOS: 6 days    HAGER, BRYAN PA-C 02/09/2016 7:49 AM  I have seen, examined and evaluated the patient this PM along with Mr. Samara Snide, [place].  After reviewing all the available data and chart,  I agree with his findings, examination as well as impression recommendations.  He continues to progress well.   Diuresis continues - agree with converting to PO Lasix Need to start oral anticoagulation -- as Renal Fxn & LFTs are better - can consider DOAC per pharmacy guidance & d/c Heparin (Defer to [facility] for timing) - may want 1 more stable Hgb level.  Will need to reassess Echo in ~1 month to check EF once he is more stable.  Need to get back in line for TAVR evaluation (probably after several months of anticoagulation - but can start the evaluation process)   Ryanna Teschner, Leonie Green, M.D., M.S. Interventional Cardiologist   Pager # [phone] Phone # [phone] [address] [place], [place] [address]

## 2016-02-09 NOTE — Progress Notes (Signed)
Pt's daughter at bedside asked if pt could stay here in hospital and pay privately.  Explained to pt and daughter [name] that Dirk Dress is not a [place] would need to discharge to a SNF. CSW following for discharge to SNF.

## 2016-02-10 ENCOUNTER — Inpatient Hospital Stay (HOSPITAL_COMMUNITY): Payer: Medicare Other

## 2016-02-10 LAB — BLOOD GAS, ARTERIAL
Acid-base deficit: 1.6 mmol/L (ref 0.0–2.0)
BICARBONATE: 21.9 meq/L (ref 20.0–24.0)
Drawn by: 41308
FIO2: 100
O2 Saturation: 99.3 %
PCO2 ART: 34.3 mmHg — AB (ref 35.0–45.0)
PH ART: 7.421 (ref 7.350–7.450)
PO2 ART: 174 mmHg — AB (ref 80.0–100.0)
Patient temperature: 98.6
TCO2: 20.2 mmol/L (ref 0–100)

## 2016-02-10 LAB — CBC
HCT: 31.3 % — ABNORMAL LOW (ref 39.0–52.0)
HEMOGLOBIN: 9.7 g/dL — AB (ref 13.0–17.0)
MCH: 27.4 pg (ref 26.0–34.0)
MCHC: 31 g/dL (ref 30.0–36.0)
MCV: 88.4 fL (ref 78.0–100.0)
Platelets: 345 10*3/uL (ref 150–400)
RBC: 3.54 MIL/uL — AB (ref 4.22–5.81)
RDW: 19.8 % — ABNORMAL HIGH (ref 11.5–15.5)
WBC: 12.4 10*3/uL — ABNORMAL HIGH (ref 4.0–10.5)

## 2016-02-10 LAB — COMPREHENSIVE METABOLIC PANEL
ALK PHOS: 328 U/L — AB (ref 38–126)
ALT: 155 U/L — AB (ref 17–63)
AST: 65 U/L — AB (ref 15–41)
Albumin: 2.4 g/dL — ABNORMAL LOW (ref 3.5–5.0)
Anion gap: 11 (ref 5–15)
BUN: 38 mg/dL — AB (ref 6–20)
CALCIUM: 8.6 mg/dL — AB (ref 8.9–10.3)
CO2: 24 mmol/L (ref 22–32)
CREATININE: 1.26 mg/dL — AB (ref 0.61–1.24)
Chloride: 101 mmol/L (ref 101–111)
GFR calc non Af Amer: 50 mL/min — ABNORMAL LOW (ref >60)
GFR, EST AFRICAN AMERICAN: 58 mL/min — AB (ref >60)
GLUCOSE: 168 mg/dL — AB (ref 65–99)
Potassium: 3.1 mmol/L — ABNORMAL LOW (ref 3.5–5.1)
SODIUM: 136 mmol/L (ref 135–145)
Total Bilirubin: 0.8 mg/dL (ref 0.3–1.2)
Total Protein: 7.2 g/dL (ref 6.5–8.1)

## 2016-02-10 LAB — GLUCOSE, CAPILLARY
GLUCOSE-CAPILLARY: 155 mg/dL — AB (ref 65–99)
Glucose-Capillary: 140 mg/dL — ABNORMAL HIGH (ref 65–99)
Glucose-Capillary: 156 mg/dL — ABNORMAL HIGH (ref 65–99)
Glucose-Capillary: 182 mg/dL — ABNORMAL HIGH (ref 65–99)

## 2016-02-10 LAB — PROTIME-INR
INR: 1.55 — ABNORMAL HIGH (ref 0.00–1.49)
Prothrombin Time: 18.6 seconds — ABNORMAL HIGH (ref 11.6–15.2)

## 2016-02-10 LAB — HEPARIN LEVEL (UNFRACTIONATED): Heparin Unfractionated: 0.3 IU/mL (ref 0.30–0.70)

## 2016-02-10 LAB — TROPONIN I: Troponin I: 0.18 ng/mL — ABNORMAL HIGH (ref <0.031)

## 2016-02-10 MED ORDER — PANTOPRAZOLE SODIUM 40 MG IV SOLR
40.0000 mg | INTRAVENOUS | Status: DC
  Administered 2016-02-10 – 2016-02-13 (×4): 40 mg via INTRAVENOUS
  Filled 2016-02-10 (×4): qty 40

## 2016-02-10 MED ORDER — IPRATROPIUM-ALBUTEROL 0.5-2.5 (3) MG/3ML IN SOLN
3.0000 mL | Freq: Four times a day (QID) | RESPIRATORY_TRACT | Status: DC
  Administered 2016-02-10 – 2016-02-13 (×14): 3 mL via RESPIRATORY_TRACT
  Filled 2016-02-10 (×14): qty 3

## 2016-02-10 MED ORDER — FUROSEMIDE 10 MG/ML IJ SOLN
20.0000 mg | Freq: Once | INTRAMUSCULAR | Status: AC
  Administered 2016-02-10: 20 mg via INTRAVENOUS
  Filled 2016-02-10: qty 2

## 2016-02-10 MED ORDER — LORAZEPAM 2 MG/ML IJ SOLN
0.5000 mg | INTRAMUSCULAR | Status: AC
  Administered 2016-02-10: 0.5 mg via INTRAVENOUS

## 2016-02-10 MED ORDER — POTASSIUM CHLORIDE 10 MEQ/100ML IV SOLN
10.0000 meq | INTRAVENOUS | Status: AC
  Administered 2016-02-10 (×3): 10 meq via INTRAVENOUS
  Filled 2016-02-10 (×2): qty 100

## 2016-02-10 MED ORDER — PIPERACILLIN-TAZOBACTAM 3.375 G IVPB
3.3750 g | Freq: Three times a day (TID) | INTRAVENOUS | Status: DC
  Administered 2016-02-10 – 2016-02-13 (×9): 3.375 g via INTRAVENOUS
  Filled 2016-02-10 (×9): qty 50

## 2016-02-10 MED ORDER — VANCOMYCIN HCL IN DEXTROSE 1-5 GM/200ML-% IV SOLN
1000.0000 mg | Freq: Two times a day (BID) | INTRAVENOUS | Status: DC
  Administered 2016-02-10 – 2016-02-12 (×5): 1000 mg via INTRAVENOUS
  Filled 2016-02-10 (×5): qty 200

## 2016-02-10 MED ORDER — LORAZEPAM 2 MG/ML IJ SOLN
INTRAMUSCULAR | Status: AC
  Filled 2016-02-10: qty 1

## 2016-02-10 MED ORDER — PIPERACILLIN-TAZOBACTAM 3.375 G IVPB 30 MIN
3.3750 g | INTRAVENOUS | Status: AC
  Administered 2016-02-10: 3.375 g via INTRAVENOUS
  Filled 2016-02-10 (×2): qty 50

## 2016-02-10 MED ORDER — INSULIN GLARGINE 100 UNIT/ML ~~LOC~~ SOLN
22.0000 [IU] | Freq: Every day | SUBCUTANEOUS | Status: DC
  Administered 2016-02-10 – 2016-02-11 (×2): 22 [IU] via SUBCUTANEOUS
  Filled 2016-02-10 (×2): qty 0.22

## 2016-02-10 MED ORDER — METHYLPREDNISOLONE SODIUM SUCC 40 MG IJ SOLR
40.0000 mg | Freq: Two times a day (BID) | INTRAMUSCULAR | Status: DC
  Administered 2016-02-10 – 2016-02-11 (×4): 40 mg via INTRAVENOUS
  Filled 2016-02-10 (×4): qty 1

## 2016-02-10 MED ORDER — IPRATROPIUM BROMIDE 0.02 % IN SOLN: 0.5000 mg | Freq: Two times a day (BID) | RESPIRATORY_TRACT | Status: DC

## 2016-02-10 MED ORDER — MORPHINE SULFATE (PF) 2 MG/ML IV SOLN
2.0000 mg | INTRAVENOUS | Status: DC | PRN
  Administered 2016-02-10: 2 mg via INTRAVENOUS
  Filled 2016-02-10: qty 1

## 2016-02-10 MED FILL — Medication: Qty: 1 | Status: AC

## 2016-02-10 NOTE — Progress Notes (Signed)
Called for patient in respiratory distress. Another RT responded to the call. Upon my arrival, order received for BiPAP. Placed on BiPAP (see CPAP/BiPAP flowsheet) post CXR, for which MD cleared acute aspiration. ABG results received from [facility], RRT, who reports high PaO2. Otherwise, normal ABG results obtained on NRB mask. Transported to ICU on BiPAP without event. MD at bedside. Family present. ICU RT aware of patient arrival and bed assignment.

## 2016-02-10 NOTE — Progress Notes (Signed)
Called to patients room due to respiratory distress. Upon arrival to room, pt was breathing in the 40s and unresponsive. Pt with rhonchi, therefore NT suctioned by this RT with small amount of thick secretions returned. Pt was placed on NRB and sats remained 100% throughout incident. ABG was obtained per order. Pt placed on bipap by Tabitha, RRT and transferred to ICU.

## 2016-02-10 NOTE — Progress Notes (Signed)
Nurse tech found the patient slid down in the bed with his legs dangling off. Upon entrance into the room, this RN noticed he was in respiratory distress. This RN as well as other staff attempted to arouse the patient but he was not responding. Upon assessment this RN found his pupils to be nonreactive, his right arm limp, and radial pulses. Since the patient is a DNR no code blue was called, however, vitals were obtained which showed the patient's oxygen saturation was dropping and respiratory rate increased. His O2 was increased to 10L via [place] until a nonrebreather mask was obtained. Family was notified. MD, respiratory, and rapid response nurse responded. Respiratory attempted deep suction. A stat chest X Ray and EKG were obtained. ABG was obtained. Patient started on BiPAP per MD orders and the patient was transferred to the ICU.

## 2016-02-10 NOTE — Progress Notes (Signed)
Was notified by nurse that pt had become unresponsive and was in respiratory distress. It is noted that pt is a DNR/DNI. He was desaturating into the 80s on nasal cannula so was placed on NRB. He had audible rhonchorous breath sounds. His other vitals were stable but respirations increased into 30-40 range. At this point I notified Dr. Fuller Plan of issue and he responded to bedside as I am not taking call from campus tonight. Pt placed on bipap and abg obtained which on NRB basically looked normal, I was suspecting that he would have had some hypercarbia. EKG with no changes. At this point pt began pulling at bipap and becoming more responsive, transferred to ICU. Pt now more agitated and pulling at Bipap again, this improved when family arrived. Chest x-ray results show increased consolidation and right pleural effusion. The etiology of his obtundation is unclear however possibly related to hypoxia, but must also consider CVA as pt off plavix, or bleed as pt on heparin. Will order head CT if pt able to tolerate.

## 2016-02-10 NOTE — Progress Notes (Signed)
Called by RN to assess patient with congested cough. Patient received BD via nebulizer and continued to have nonproductive cough. [place] suction provided into oropharynx for scant amount of white secretions. Patient denies being able to expectorate and refuses to allow further suctioning. Upon several attempts, patient grabs this RT's hands and pulls away from his face. He says "no" to nasotracheal suctioning at this time. RN aware.

## 2016-02-10 NOTE — Progress Notes (Signed)
[facility] PROGRESS NOTE  Douglas Carrillo [id number] DOB: March 09, 1928 DOA: 02/03/2016 PCP: Limmie Patricia, MD  Summary 02/07/16: I have seen and examined Douglas Carrillo at bedside and reviewed his chart. Appreciate cardiology/pulmonary. Douglas Carrillo is a very pleasant 82 year old male being treated for acute respiratory failure with hypoxia resulting from pulmonary embolism/?HCAP/pleural effusion. He has required PRBC transfusion. He is on heparin/broad-spectrum antibiotics. There is no overt bleeding. Anemia concerning his findings long-term anticoagulation is concerned. Will continue anticoagulation/empiric antibiotics and monitor for further evidence of bleeding. Patient still slightly dyspneic therefore will keep in SDU. 02/08/16: [place] [facility]ology. Patient still has cough and this is concerning. His hemoglobin is reassuringly stable. Cause of transaminitis not clear, abdominal ultrasound pending, agree with holding off on transitioning to oral anticoagulant in view of transaminitis. Will give low-dose aspirin for CVA as patient will be off Plavix. We'll continue antibiotics to complete course as recommended by pulmonary. Patient stable for transfer to telemetry when bed becomes available. 02/09/16: [facility] [facility]. Hemoglobin remains stable. Will start Coumadin per pharmacy for target INR 2-3. Transaminitis improved, abdominal ultrasound unrevealing. Transaminitis may be related to medications/infection/congestive hepatopathy. Will give patient doxycycline instead of Zithromax in view of persistent cough. 02/10/16: Noted night developments-episode of unresponsiveness followed by respiratory distress and fever with chest x-ray suggesting worsening consolidation on the right side, white count increasing to 12,400; this despite being on cefepime/doxycycline. Discussed with Dr. Nahser/Dr. Lamonte Sakai. Appreciate cardiology/pulmonary help. Patient not candidate for valve replacement given  persistent infection, not candidate for pleural tap at this point as it is not clear if most of the abnormal chest x-ray findings related to fluid or a combination. It appears that patient has progressive respiratory failure probably related to aspiration pneumonitis/atelectasis. A finding of CVA would not change current management unless intracranial bleed which is not evident at this point. We'll therefore hold off on CT brain, change antibiotics to Vancomycin/Zosyn, start low-dose steroids, hold most of her blood pressure medications for now, continue oxygen supplementation as necessary(patient did not tolerate BiPAP), hold Coumadin for now but continue intravenous heparin, and follow cardiology/pulmonary recommendations-chest PT ordered. I had long discussion with patient's daughter [name] who is hopeful that patient will improve. However, it appears that the multiple conditions including severe pneumonia/pleural effusion/severe aortic stenosis/systolic CHF/use pulmonary embolism/anemia do not potent a good prognosis despite current efforts. It remains to be seen if patient will get significantly better. Morphine helping with comfort, and is also potentially causing respiratory depression. Patient mostly somnolent but responds to verbal stimuli. Patient's condition critical. Time spent for this critical care follow-up is approximately 35 minutes. Plan Acute respiratory failure with hypoxia (HCC)/COPD, moderate (HCC)/Sepsis (HCC)/Transaminitis/HCAP (healthcare-associated pneumonia)/Pleural effusion on right  Change antibiotics to Vancomycin/Zosyn  Solu-Medrol  Oxygen supplementation/bronchodilators as needed  Chest physical therapy for pulmonary  Patient not tolerating BiPAP CAD (coronary artery disease) of artery bypass graft/Aortic valve stenosis/acute on Chronic diastolic CHF (congestive heart failure) (HCC) EF 30%/Valvular cardiomyopathy (HCC)/Essential hypertension  Appreciate cardiology  Hold  losartan/Lopressor until oral intake more assured  Patient not suitable candidate for aortic valve replacement at this point given ongoing infection. Agree with holding off procedure indefinitely. Bilateral pulmonary embolism (HCC)/DVT of lower extremity, bilateral (HCC)/Leg DVT (deep venous thromboembolism), acute ([place])  Patient on heparin per pharmacy. Appreciate pharmacy help.  Discontinue Coumadin  Normocytic anemia/Anemia due to acute blood loss  Hemoglobin stable  Monitor for evidence of bleeding  Transfuse PRBC as needed  May need further investigations if recurrent blood loss Hyperlipidemia with target  LDL less than 70/DM2 (diabetes mellitus, type 2) (HCC)/GERD (gastroesophageal reflux disease)  Hemoglobin A1c 6.8  Sugars generally controlled  Continue Lantus/SSI  PPI  Statins on hold in view of transaminitis Acute kidney failure ([place])  Likely due to a combination of factors including diuretics  Appears stable  Monitor bun/cr History of CVA  Low-dose aspirin  Statins on hold due to transaminitis Code Status: DNR Family Communication: Daughter at bedside Disposition Plan: Likely SNFearly next week.   Consultants:  Pulmonary  Cardiology  Procedures:  prbc transufion  Antibiotics:  Cefepime>>02/10/16  Zithromax>>02/09/16  Doxycycline 02/09/16>>  Vancomycin 02/10/2016>>  Zosyn 02/10/2016>>  HPI/Subjective: "So,so"  Objective: Filed Vitals:   02/10/16 1000 02/10/16 1237  BP: 114/47   Pulse:    Temp:  97.6 F (36.4 C)  Resp: 32     Intake/Output Summary (Last 24 hours) at 02/10/16 1619 Last data filed at 02/09/16 1822  Gross per 24 hour  Intake   1600 ml  Output      0 ml  Net   1600 ml   Filed Weights   02/08/16 1925 02/09/16 0600 02/10/16 0350  Weight: 81.9 kg (180 lb 8.9 oz) 83.4 kg (183 lb 13.8 oz) 78.4 kg (172 lb 13.5 oz)    Exam:   General:  Mild respiratory distress.  Cardiovascular: S1-S2 normal. No murmurs. Pulse  regular.  Respiratory: Bilateral rhonchi.  Abdomen: Soft and nontender. Normal bowel sounds. No organomegaly.  Musculoskeletal: No pedal edema   Neurological: Somnolent but arousable.  Data Reviewed: Basic Metabolic Panel:  Recent Labs Lab 02/05/16 0150 02/06/16 0334 02/07/16 0630 02/08/16 0311 02/09/16 0746 02/10/16 0702  NA 136 137 136 136 139 136  K 4.2 4.2 3.4* 3.7 3.0* 3.1*  CL 108 109 104 104 102 101  CO2 18* 19* 21* 23 27 24   GLUCOSE 206* 199* 149* 152* 117* 168*  BUN 25* 31* 33* 36* 36* 38*  CREATININE 1.39* 1.29* 1.27* 1.22 1.16 1.26*  CALCIUM 7.7* 8.1* 8.3* 8.1* 8.6* 8.6*  MG 2.2  --  1.9 1.9  --   --   PHOS  --   --   --  2.4*  --   --    Liver Function Tests:  Recent Labs Lab 02/06/16 0815 02/07/16 0630 02/08/16 0510 02/09/16 0746 02/10/16 0702  AST 291* 615* 34 75* 65*  ALT 290* 429* 13* 189* 155*  ALKPHOS 335* 478* 48 356* 328*  BILITOT 0.5 0.8 1.0 0.7 0.8  PROT 6.7 7.0 5.3* 6.9 7.2  ALBUMIN 2.3* 2.3* 3.1* 2.2* 2.4*   No results for input(s): LIPASE, AMYLASE in the last 168 hours. No results for input(s): AMMONIA in the last 168 hours. CBC:  Recent Labs Lab 02/05/16 0150 02/06/16 0334 02/07/16 0630 02/08/16 0311 02/09/16 0557 02/10/16 0702  WBC 9.5 7.2 6.4 7.3 6.8 12.4*  NEUTROABS 7.8*  --   --   --   --   --   HGB 7.6* 7.4* 9.1* 9.4* 9.4* 9.7*  HCT 24.7* 24.2* 29.4* 28.9* 29.4* 31.3*  MCV 89.5 90.3 88.0 87.6 87.8 88.4  PLT 261 283 262 274 315 345   Cardiac Enzymes:  Recent Labs Lab 02/04/16 1435 02/04/16 1931 02/05/16 0150 02/10/16 0702  TROPONINI 1.05* 1.04* 0.96* 0.18*   BNP (last 3 results)  Recent Labs  01/09/16 0704 02/04/16 1435 02/06/16 0815  BNP 338.3* 1553.6* 1933.8*    ProBNP (last 3 results) No results for input(s): PROBNP in the last 8760 hours.  CBG:  Recent  Labs Lab 02/09/16 0753 02/09/16 1136 02/09/16 1709 02/10/16 0007 02/10/16 1056  GLUCAP 108* 246* 130* 140* 155*    Recent Results  (from the past 240 hour(s))  Culture, blood (routine x 2)     Status: None   Collection Time: 02/03/16  1:10 PM  Result Value Ref Range Status   Specimen Description BLOOD BLOOD LEFT FOREARM  Final   Special Requests BOTTLES DRAWN AEROBIC AND ANAEROBIC 5CC  Final   Culture   Final    NO GROWTH 5 DAYS Performed at [facility]    Report Status 02/08/2016 FINAL  Final  Culture, blood (routine x 2)     Status: None   Collection Time: 02/03/16  1:12 PM  Result Value Ref Range Status   Specimen Description BLOOD BLOOD LEFT FOREARM  Final   Special Requests IN PEDIATRIC BOTTLE 3CC  Final   Culture   Final    NO GROWTH 5 DAYS Performed at [facility]    Report Status 02/08/2016 FINAL  Final  MRSA PCR Screening     Status: None   Collection Time: 02/03/16  6:53 PM  Result Value Ref Range Status   MRSA by PCR NEGATIVE NEGATIVE Final    Comment:        The GeneXpert MRSA Assay (FDA approved for NASAL specimens only), is one component of a comprehensive MRSA colonization surveillance program. It is not intended to diagnose MRSA infection nor to guide or monitor treatment for MRSA infections.   Respiratory virus panel     Status: None   Collection Time: 02/05/16  9:13 AM  Result Value Ref Range Status   Respiratory Syncytial Virus A Negative Negative Final   Respiratory Syncytial Virus B Negative Negative Final   Influenza A Negative Negative Final   Influenza B Negative Negative Final   Parainfluenza 1 Negative Negative Final   Parainfluenza 2 Negative Negative Final   Parainfluenza 3 Negative Negative Final   Metapneumovirus Negative Negative Final   Rhinovirus Negative Negative Final   Adenovirus Negative Negative Final    Comment: (NOTE) Performed At: [facility] [address] [place], [place] [id number] Lindon Romp MD [id number]      Studies: Dg Chest Port 1 View  02/10/2016  CLINICAL DATA:  Shortness of breath. Unresponsive. History  of hypertension, diabetes, prostate cancer, former smoker. EXAM: PORTABLE CHEST 1 VIEW COMPARISON:  02/03/2016 FINDINGS: Examination is limited due to patient rotation. Postoperative changes in the mediastinum. Cardiac enlargement without significant vascular congestion. Persistent infiltration and consolidation in the right lung with progression since previous study, likely pneumonia. Small right pleural effusion is developing. Left lung is clear and expanded. Calcified and tortuous aorta. IMPRESSION: Interval progression of right lung consolidation and infiltration as well as right pleural effusion. Electronically Signed   By: Lucienne Capers M.D.   On: 02/10/2016 05:58    Scheduled Meds: . arformoterol  15 mcg Nebulization BID  . aspirin  81 mg Oral Daily  . budesonide (PULMICORT) nebulizer solution  0.25 mg Nebulization BID  . docusate sodium  100 mg Oral BID  . feeding supplement (PRO-STAT SUGAR FREE 64)  30 mL Oral BID  . ferrous sulfate  325 mg Oral BID WC  . fluticasone  2 spray Each Nare Daily  . furosemide  40 mg Intravenous Q12H  . guaiFENesin  1,200 mg Oral BID  . insulin aspart  0-15 Units Subcutaneous TID WC  . insulin glargine  22 Units Subcutaneous Daily  .  ipratropium-albuterol  3 mL Nebulization QID  . latanoprost  1 drop Both Eyes QHS  . loratadine  10 mg Oral Daily  . LORazepam      . methylPREDNISolone (SOLU-MEDROL) injection  40 mg Intravenous Q12H  . oxybutynin  5 mg Oral Daily  . pantoprazole (PROTONIX) IV  40 mg Intravenous Q24H  . piperacillin-tazobactam (ZOSYN)  IV  3.375 g Intravenous Q8H  . protein supplement shake  8 oz Oral BID BM  . sodium chloride flush  3 mL Intravenous Q12H  . vancomycin  1,000 mg Intravenous Q12H   Continuous Infusions: . heparin 1,300 Units/hr (02/10/16 1035)     Time spent: 25 minutes    Deberah Adolf  [facility] Pager [phone]. If 7PM-7AM, please contact night-coverage at [email or web address], password  [facility] 02/10/2016, 4:19 PM  LOS: 7 days

## 2016-02-10 NOTE — Progress Notes (Signed)
Pharmacy Antibiotic Note  Douglas Carrillo is a 82 y.o. male admitted on 02/03/2016 with pneumonia.  Pharmacy was consulted for vancomycin and cefepime dosing. Patient also has BL PE.  Recently completed course of vancomycin for MRSA bacteremia.  2/18: patient had an episode of hypoxia and unresponsiveness. Pharmacy is asked to start Vanc and Zosyn for suspected aspiration pneumonia.  Plan:  Zosyn 3.375g IV over 47min x 1 then Zosyn 3.375g IV Q8H infused over 4hrs.  Vancomycin 1g IV q12h.  Measure Vanc trough at steady state.  Follow up renal fxn, culture results, and clinical course.  Height: 6' (182.9 cm) Weight: 172 lb 13.5 oz (78.4 kg) IBW/kg (Calculated) : 77.6  Temp (24hrs), Avg:98.6 F (37 C), Min:97.6 F (36.4 C), Max:100.8 F (38.2 C)   Recent Labs Lab 02/03/16 1322 02/03/16 1650 02/03/16 1923 02/03/16 2300  02/05/16 0150 02/06/16 0334 02/07/16 0630 02/08/16 0311 02/09/16 0557 02/09/16 0746 02/10/16 0702  WBC  --   --   --   --   < > 9.5 7.2 6.4 7.3 6.8  --  12.4*  CREATININE  --   --   --   --   < > 1.39* 1.29* 1.27* 1.22  --  1.16 1.26*  LATICACIDVEN 3.04* 1.29 2.5* 1.7  --  1.4  --   --   --   --   --   --   < > = values in this interval not displayed.  Estimated Creatinine Clearance: 47 mL/min (by C-G formula based on Cr of 1.26).    Allergies  Allergen Reactions  . Mold Extract [Trichophyton] Anaphylaxis  . Lipitor [Atorvastatin] Other (See Comments)    Weakness in legs/myalgias    Antimicrobials this admission: 2/11 >> Vanc >> 2/14, 2/18 >> Vanc >> 2/11 >> Cefepime x8d >> 2/18 2/12 >> Azithromycin >> 2/17 2/17 >> Doxy >> 2/18 2/18 >> Zosyn >>  Levels/dose changes this admission: Previous admission 1/21 VT = 14 on 750mg  q12h Previous admission 1/24 VT = 18 on 1gm q12h  Microbiology Results: 1/17 BCx2: MRSA (Vanc MIC < 0.5) 2/11 MRSA PCR: negative (positive on prev admission with MRSA bacteremia) 2/11 BCx: NGF 2/13 resp virus panel:  neg 2/13 sputum: not collected 2/13 Legionella: negative 2/13 Strep: negative  Thank you for allowing pharmacy to be a part of this patient's care.  Romeo Rabon, PharmD, pager [phone]. 02/10/2016,10:14 AM.

## 2016-02-10 NOTE — Progress Notes (Signed)
PROGRESS NOTE  Subjective:   Douglas Carrillo is a 82 y.o. male with the past medical history of hypertension, coronary artery disease, is oxygen dependent COPD, chronic kidney disease, severe aortic stenosis, recent stay in the hospital for Staphylococcus aureus bacteremia and healthcare associated pneumonia, presents from his skilled nursing facility with the complaints of shortness of breath, cough and fever Was found to have DVT with sub-massive pulmonary embolus   Had an episode of unresponsiveness  This am     Objective:    Vital Signs:   Temp:  [97.8 F (36.6 C)-100.8 F (38.2 C)] 98.1 F (36.7 C) (02/18 0500) Pulse Rate:  [90-91] 90 (02/17 2100) Resp:  [20-39] 36 (02/18 0534) BP: (102-121)/(50-69) 121/69 mmHg (02/18 0500) SpO2:  [97 %-100 %] 100 % (02/18 0534) Weight:  [172 lb 13.5 oz (78.4 kg)] 172 lb 13.5 oz (78.4 kg) (02/18 0350)  Last BM Date:  (unknown, PTA)   24-hour weight change: Weight change: -12 lb 12.6 oz (-5.8 kg)  Weight trends: Filed Weights   02/08/16 1925 02/09/16 0600 02/10/16 0350  Weight: 180 lb 8.9 oz (81.9 kg) 183 lb 13.8 oz (83.4 kg) 172 lb 13.5 oz (78.4 kg)    Intake/Output:  02/17 0701 - 02/18 0700 In: 1840 [P.O.:240] Out: -      Physical Exam: BP 121/69 mmHg  Pulse 90  Temp(Src) 98.1 F (36.7 C) (Axillary)  Resp 36  Ht 6' (1.829 m)  Wt 172 lb 13.5 oz (78.4 kg)  BMI 23.44 kg/m2  SpO2 100%  Wt Readings from Last 3 Encounters:  02/10/16 172 lb 13.5 oz (78.4 kg)  02/02/16 182 lb 6.4 oz (82.736 kg)  01/26/16 182 lb 6.4 oz (82.736 kg)    General:   Head: Normocephalic, atraumatic.  Eyes: conjunctivae/corneas clear.  EOM's intact.   Throat: normal  Neck:  thick   Lungs:  On high flow oxygen    Heart:  RR, [id number] systolic murmur   Abdomen:  Thin,  Non tender  Extremities:    No edema   Neurologic: A&O X3, CN II - XII are grossly intact.   Psych: Normal     Labs: BMET:  Recent Labs  02/08/16 0311  02/09/16 0746  NA 136 139  K 3.7 3.0*  CL 104 102  CO2 23 27  GLUCOSE 152* 117*  BUN 36* 36*  CREATININE 1.22 1.16  CALCIUM 8.1* 8.6*  MG 1.9  --   PHOS 2.4*  --     Liver function tests:  Recent Labs  02/08/16 0510 02/09/16 0746  AST 34 75*  ALT 13* 189*  ALKPHOS 48 356*  BILITOT 1.0 0.7  PROT 5.3* 6.9  ALBUMIN 3.1* 2.2*   No results for input(s): LIPASE, AMYLASE in the last 72 hours.  CBC:  Recent Labs  02/09/16 0557 02/10/16 0702  WBC 6.8 12.4*  HGB 9.4* 9.7*  HCT 29.4* 31.3*  MCV 87.8 88.4  PLT 315 345    Cardiac Enzymes:  Recent Labs  02/10/16 0702  TROPONINI 0.18*    Coagulation Studies:  Recent Labs  02/08/16 0311 02/10/16 0702  LABPROT 19.1* 18.6*  INR 1.61* 1.55*    Other: Invalid input(s): POCBNP No results for input(s): DDIMER in the last 72 hours. No results for input(s): HGBA1C in the last 72 hours. No results for input(s): CHOL, HDL, LDLCALC, TRIG, CHOLHDL in the last 72 hours. No results for input(s): TSH, T4TOTAL, T3FREE, THYROIDAB in the last 72 hours.  Invalid input(s): FREET3 No results for input(s): VITAMINB12, FOLATE, FERRITIN, TIBC, IRON, RETICCTPCT in the last 72 hours.   Other results:  EKG  ( personally reviewed )   NSR at 99  Medications:    Infusions: . heparin 1,250 Units/hr (02/09/16 1349)    Scheduled Medications: . arformoterol  15 mcg Nebulization BID  . aspirin  81 mg Oral Daily  . budesonide (PULMICORT) nebulizer solution  0.25 mg Nebulization BID  . ceFEPime (MAXIPIME) IV  2 g Intravenous Q24H  . docusate sodium  100 mg Oral BID  . doxycycline  100 mg Oral Q12H  . feeding supplement (PRO-STAT SUGAR FREE 64)  30 mL Oral BID  . ferrous sulfate  325 mg Oral BID WC  . fluticasone  2 spray Each Nare Daily  . furosemide  40 mg Intravenous Q12H  . guaiFENesin  1,200 mg Oral BID  . insulin aspart  0-15 Units Subcutaneous TID WC  . insulin glargine  28 Units Subcutaneous Daily  .  ipratropium-albuterol  3 mL Nebulization QID  . latanoprost  1 drop Both Eyes QHS  . loratadine  10 mg Oral Daily  . LORazepam      . losartan  25 mg Oral Daily  . metoprolol succinate  50 mg Oral Daily  . oxybutynin  5 mg Oral Daily  . pantoprazole  40 mg Oral Q0600  . protein supplement shake  8 oz Oral BID BM  . sodium chloride flush  3 mL Intravenous Q12H  . Warfarin - Pharmacist Dosing Inpatient   Does not apply q1800    Assessment/ Plan:   Principal Problem:   Acute respiratory failure with hypoxia (HCC) Active Problems:   CAD (coronary artery disease) of artery bypass graft   Hyperlipidemia with target LDL less than 70   Essential hypertension   DM2 (diabetes mellitus, type 2) (HCC)   Aortic valve stenosis   COPD, moderate (HCC)   Acute kidney failure (HCC)   Normocytic anemia   GERD (gastroesophageal reflux disease)   Sepsis (HCC)   HCAP (healthcare-associated pneumonia)   Stroke (cerebrum) (HCC)   Pleural effusion on right   Leg DVT (deep venous thromboembolism), acute (HCC)   Chronic diastolic CHF (congestive heart failure) (HCC)   Transaminitis   Bilateral pulmonary embolism (HCC)   DVT of lower extremity, bilateral (HCC)   Anemia due to acute blood loss   Valvular cardiomyopathy ([place])   Acute combined systolic and diastolic heart failure ([place])   Demand ischemia of myocardium (HCC)  1. Aortic stenosis: At this point,he is not a TAVR candidate.  He has had multiple complications - including a sub-massive pulmonary embolus with respiratory failure  Ive discussed this with Dr.  Sanjuana Letters and with family at the bedside.  At this point, I would not consider him a candidate for TAVR. If he make significant improvement, we could consider TAVR but I think that is unlikely.  Consider comfort / Palliative care approach.   Disposition:  Length of Stay: 7  Thayer Headings, Brooke Bonito., MD, [facility] 02/10/2016, 9:13 AM Office [phone] Pager [phone]

## 2016-02-10 NOTE — Progress Notes (Signed)
[place] for IV heparin/warfarin Indication: VTE Treatment  Allergies  Allergen Reactions  . Mold Extract [Trichophyton] Anaphylaxis  . Lipitor [Atorvastatin] Other (See Comments)    Weakness in legs/myalgias    Patient Measurements: Height: 6' (182.9 cm) Weight: 172 lb 13.5 oz (78.4 kg) IBW/kg (Calculated) : 77.6 Heparin Dosing Weight: 82.7kg  Vital Signs: Temp: 98.1 F (36.7 C) (02/18 0500) Temp Source: Axillary (02/18 0500) BP: 121/69 mmHg (02/18 0500) Pulse Rate: 90 (02/17 2100)  Labs:  Recent Labs  02/08/16 0311 02/08/16 0331 02/09/16 0557 02/09/16 0746 02/10/16 0702  HGB 9.4*  --  9.4*  --  9.7*  HCT 28.9*  --  29.4*  --  31.3*  PLT 274  --  315  --  345  LABPROT 19.1*  --   --   --  18.6*  INR 1.61*  --   --   --  1.55*  HEPARINUNFRC  --  0.38 0.37  --  0.30  CREATININE 1.22  --   --  1.16  --   TROPONINI  --   --   --   --  0.18*    Estimated Creatinine Clearance: 51.1 mL/min (by C-G formula based on Cr of 1.16).   Assessment: 49 yoM with COPD on home 02, CAD, CKD, HTN, severe aortic stenosis and recent hospitalization for staph bacteremia and HCAP presents 2/11 with SOB and cough found to have recurrent right-sided PNA and probably parapneumonic effusion with elevated LA and tachycardia, started on antibiotics.  Dopplers now positive for DVT in peroneal veins, left distal popliteal vein, left soleal veins, and left peroneal veins.  VQ scan reveals BL PE (categorized as at least submassive with R heart strain noted). Pharmacy consulted to start IV heparin for VTE treatment.  On 2/17 Pharmacy is also consulted to dose warfarin.  PTA on plavix (?not aspirin) though pt reports not taking medication.  Resumed inpatient. Stopped 2/14 by TR[facility].  Today, 02/10/2016:  Episode of hypoxia and decreased responsiveness this am. Transferred to the ICU. CXR showed increased consolidation and R pleural effusion.  Heparin level at  low-end of target range on 1250 units/hr.  INR not rising yet after first dose of warfarin 2.5mg  yesterday. (Note: baseline INR and LFTs were elevated, likely from hepatic congestion from R-sided HF d/t PE.)  CBC improved and stable. No bleeding reported/documented. Regular diet.  Goal of Therapy:  Heparin level 0.3-0.7 units/ml Monitor platelets by anticoagulation protocol: Yes   Plan:   Increase heparin just slightly to 1300units/hr.  Warfarin is dc'd.  Daily heparin level and CBC.   Romeo Rabon, PharmD, pager [phone]. 02/10/2016,8:59 AM.

## 2016-02-11 LAB — COMPREHENSIVE METABOLIC PANEL
ALBUMIN: 2.4 g/dL — AB (ref 3.5–5.0)
ALK PHOS: 299 U/L — AB (ref 38–126)
ALT: 113 U/L — AB (ref 17–63)
AST: 39 U/L (ref 15–41)
Anion gap: 12 (ref 5–15)
BILIRUBIN TOTAL: 1 mg/dL (ref 0.3–1.2)
BUN: 34 mg/dL — AB (ref 6–20)
CALCIUM: 8.7 mg/dL — AB (ref 8.9–10.3)
CO2: 23 mmol/L (ref 22–32)
CREATININE: 1.26 mg/dL — AB (ref 0.61–1.24)
Chloride: 101 mmol/L (ref 101–111)
GFR calc Af Amer: 58 mL/min — ABNORMAL LOW (ref >60)
GFR, EST NON AFRICAN AMERICAN: 50 mL/min — AB (ref >60)
GLUCOSE: 215 mg/dL — AB (ref 65–99)
POTASSIUM: 3.7 mmol/L (ref 3.5–5.1)
Sodium: 136 mmol/L (ref 135–145)
TOTAL PROTEIN: 7.1 g/dL (ref 6.5–8.1)

## 2016-02-11 LAB — GLUCOSE, CAPILLARY
GLUCOSE-CAPILLARY: 315 mg/dL — AB (ref 65–99)
GLUCOSE-CAPILLARY: 237 mg/dL — AB (ref 65–99)
Glucose-Capillary: 202 mg/dL — ABNORMAL HIGH (ref 65–99)
Glucose-Capillary: 321 mg/dL — ABNORMAL HIGH (ref 65–99)

## 2016-02-11 LAB — CBC
HEMATOCRIT: 27.9 % — AB (ref 39.0–52.0)
HEMOGLOBIN: 8.8 g/dL — AB (ref 13.0–17.0)
MCH: 27.8 pg (ref 26.0–34.0)
MCHC: 31.5 g/dL (ref 30.0–36.0)
MCV: 88.3 fL (ref 78.0–100.0)
Platelets: 317 10*3/uL (ref 150–400)
RBC: 3.16 MIL/uL — ABNORMAL LOW (ref 4.22–5.81)
RDW: 19.6 % — ABNORMAL HIGH (ref 11.5–15.5)
WBC: 9.6 10*3/uL (ref 4.0–10.5)

## 2016-02-11 LAB — HEPARIN LEVEL (UNFRACTIONATED): HEPARIN UNFRACTIONATED: 0.45 [IU]/mL (ref 0.30–0.70)

## 2016-02-11 MED ORDER — METOPROLOL TARTRATE 12.5 MG HALF TABLET
12.5000 mg | ORAL_TABLET | Freq: Two times a day (BID) | ORAL | Status: DC
  Administered 2016-02-11 – 2016-02-13 (×5): 12.5 mg via ORAL
  Filled 2016-02-11 (×5): qty 1

## 2016-02-11 NOTE — Progress Notes (Signed)
[facility] PROGRESS NOTE  Douglas Carrillo [id number] DOB: June 15, 1928 DOA: 02/03/2016 PCP: Limmie Patricia, MD  Summary 02/07/16: I have seen and examined Douglas Carrillo at bedside and reviewed his chart. Appreciate cardiology/pulmonary. Douglas Carrillo is a very pleasant 82 year old male being treated for acute respiratory failure with hypoxia resulting from pulmonary embolism/?HCAP/pleural effusion. He has required PRBC transfusion. He is on heparin/broad-spectrum antibiotics. There is no overt bleeding. Anemia concerning his findings long-term anticoagulation is concerned. Will continue anticoagulation/empiric antibiotics and monitor for further evidence of bleeding. Patient still slightly dyspneic therefore will keep in SDU. 02/08/16: [place] [facility]. Patient still has cough and this is concerning. His hemoglobin is reassuringly stable. Cause of transaminitis not clear, abdominal ultrasound pending, agree with holding off on transitioning to oral anticoagulant in view of transaminitis. Will give low-dose aspirin for CVA as patient will be off Plavix. We'll continue antibiotics to complete course as recommended by pulmonary. Patient stable for transfer to telemetry when bed becomes available. 02/09/16: [facility]. Hemoglobin remains stable. Will start Coumadin per pharmacy for target INR 2-3. Transaminitis improved, abdominal ultrasound unrevealing. Transaminitis may be related to medications/infection/congestive hepatopathy. Will give patient doxycycline instead of Zithromax in view of persistent cough. 02/10/16: Noted night developments-episode of unresponsiveness followed by respiratory distress and fever with chest x-ray suggesting worsening consolidation on the right side, white count increasing to 12,400; this despite being on cefepime/doxycycline. Discussed with Dr. Nahser/Dr. Lamonte Sakai. Appreciate cardiology/pulmonary help. Patient not candidate for valve replacement given  persistent infection, not candidate for pleural tap at this point as it is not clear if most of the abnormal chest x-ray findings related to fluid or a combination. It appears that patient has progressive respiratory failure probably related to aspiration pneumonitis/atelectasis. A finding of CVA would not change current management unless intracranial bleed which is not evident at this point. We'll therefore hold off on CT brain, change antibiotics to Vancomycin/Zosyn, start low-dose steroids, hold most of her blood pressure medications for now, continue oxygen supplementation as necessary(patient did not tolerate BiPAP), hold Coumadin for now but continue intravenous heparin, and follow cardiology/pulmonary recommendations-chest PT ordered. I had long discussion with patient's daughter [name] who is hopeful that patient will improve. However, it appears that the multiple conditions including severe pneumonia/pleural effusion/severe aortic stenosis/systolic CHF/use pulmonary embolism/anemia do not potent a good prognosis despite current efforts. It remains to be seen if patient will get significantly better. Morphine helping with comfort, and is also potentially causing respiratory depression. Patient mostly somnolent but responds to verbal stimuli. Patient's condition critical. Time spent for this critical care follow-up is approximately 35 minutes. 02/11/16: Appreciate pulmonary. Patient much more with it and engaging in meaningful conversation. Hb dropped 9.7>>8.8g/dl, wbc better 12.400>>9.600. Fever resolved. No overt bleeding; will keep monitoring for evidence of bleeding, transfuse prbc if Hb drops below 8 g/dl(in view of heart disease). Patient feels better. Has no complaints. We'll keep patient in SDU today given tenuous cardiopulmonary status. Plan Acute respiratory failure with hypoxia (HCC)/COPD, moderate (HCC)/Sepsis (HCC)/Transaminitis/HCAP (healthcare-associated pneumonia)/Pleural effusion on  right  Continue Vancomycin/Zosyn  Continue Solu-Medrol  Oxygen supplementation/bronchodilators as needed  Chest physical therapy per pulmonary CAD (coronary artery disease) of artery bypass graft/Aortic valve stenosis/acute on Chronic diastolic CHF (congestive heart failure) (HCC) EF 30%/Valvular cardiomyopathy (HCC)/Essential hypertension  BP stable but renal function remains off  Continue to hold losartan in view of renal insufficiency but resume low dose/Lopressor until oral intake more assured  Patient not suitable candidate for aortic valve replacement at this point given  ongoing infection. Agree with holding off procedure indefinitely. Bilateral pulmonary embolism (HCC)/DVT of lower extremity, bilateral (HCC)/Leg DVT (deep venous thromboembolism), acute ([place])  Patient on heparin per pharmacy. Appreciate pharmacy help.  Continue to hold Coumadin as drop in hemoglobin Normocytic anemia/Anemia due to acute blood loss  Some drop in Hemoglobin   Monitor for evidence of bleeding  Transfuse PRBC as needed  May need further investigations if recurrent blood loss Hyperlipidemia with target LDL less than 70/DM2 (diabetes mellitus, type 2) (HCC)/GERD (gastroesophageal reflux disease)  Hemoglobin A1c 6.8  Sugars generally controlled  Continue Lantus/SSI  PPI  Statins on hold in view of transaminitis Acute kidney failure ([place])  Likely due to a combination of factors including diuretics.  Monitor bun/cr History of CVA  Low-dose aspirin  Statins on hold due to transaminitis Code Status: DNR Family Communication: Daughter at bedside Disposition Plan: Likely SNF eventually. Keep in SDU today  Consultants:  Pulmonary  Cardiology  Procedures:  prbc transufion  Antibiotics:  Cefepime>>02/10/16  Zithromax>>02/09/16  Doxycycline 02/09/16>>  Vancomycin 02/10/2016>>  Zosyn 02/10/2016>>  HPI/Subjective: Feels better. No complaints. Has not seen any  bleeding.  Objective: Filed Vitals:   02/11/16 0800 02/11/16 0900  BP: 127/57 129/62  Pulse:    Temp: 98.7 F (37.1 C)   Resp: 25 22    Intake/Output Summary (Last 24 hours) at 02/11/16 1136 Last data filed at 02/11/16 0900  Gross per 24 hour  Intake 1348.72 ml  Output   1100 ml  Net 248.72 ml   Filed Weights   02/09/16 0600 02/10/16 0350 02/11/16 0316  Weight: 83.4 kg (183 lb 13.8 oz) 78.4 kg (172 lb 13.5 oz) 78.4 kg (172 lb 13.5 oz)    Exam:   General:  Comfortable at rest.  Cardiovascular: S1-S2 normal. No murmurs. Pulse regular.  Respiratory: Good air entry bilaterally. No rhonchi or rales.  Abdomen: Soft and nontender. Normal bowel sounds. No organomegaly.  Musculoskeletal: No pedal edema   Neurological: Intact  Data Reviewed: Basic Metabolic Panel:  Recent Labs Lab 02/05/16 0150  02/07/16 0630 02/08/16 0311 02/09/16 0746 02/10/16 0702 02/11/16 0329  NA 136  < > 136 136 139 136 136  K 4.2  < > 3.4* 3.7 3.0* 3.1* 3.7  CL 108  < > 104 104 102 101 101  CO2 18*  < > 21* 23 27 24 23   GLUCOSE 206*  < > 149* 152* 117* 168* 215*  BUN 25*  < > 33* 36* 36* 38* 34*  CREATININE 1.39*  < > 1.27* 1.22 1.16 1.26* 1.26*  CALCIUM 7.7*  < > 8.3* 8.1* 8.6* 8.6* 8.7*  MG 2.2  --  1.9 1.9  --   --   --   PHOS  --   --   --  2.4*  --   --   --   < > = values in this interval not displayed. Liver Function Tests:  Recent Labs Lab 02/07/16 0630 02/08/16 0510 02/09/16 0746 02/10/16 0702 02/11/16 0329  AST 615* 34 75* 65* 39  ALT 429* 13* 189* 155* 113*  ALKPHOS 478* 48 356* 328* 299*  BILITOT 0.8 1.0 0.7 0.8 1.0  PROT 7.0 5.3* 6.9 7.2 7.1  ALBUMIN 2.3* 3.1* 2.2* 2.4* 2.4*   No results for input(s): LIPASE, AMYLASE in the last 168 hours. No results for input(s): AMMONIA in the last 168 hours. CBC:  Recent Labs Lab 02/05/16 0150  02/07/16 0630 02/08/16 [id number] 02/09/16 [id number] 02/10/16 [id number] 02/11/16 [id number]  WBC 9.5  < > 6.4 7.3 6.8 12.4* 9.6  NEUTROABS 7.8*   --   --   --   --   --   --   HGB 7.6*  < > 9.1* 9.4* 9.4* 9.7* 8.8*  HCT 24.7*  < > 29.4* 28.9* 29.4* 31.3* 27.9*  MCV 89.5  < > 88.0 87.6 87.8 88.4 88.3  PLT 261  < > 262 274 315 345 317  < > = values in this interval not displayed. Cardiac Enzymes:  Recent Labs Lab 02/04/16 1435 02/04/16 1931 02/05/16 0150 02/10/16 0702  TROPONINI 1.05* 1.04* 0.96* 0.18*   BNP (last 3 results)  Recent Labs  01/09/16 0704 02/04/16 1435 02/06/16 0815  BNP 338.3* 1553.6* 1933.8*    ProBNP (last 3 results) No results for input(s): PROBNP in the last 8760 hours.  CBG:  Recent Labs Lab 02/10/16 0007 02/10/16 1056 02/10/16 1701 02/10/16 2144 02/11/16 0757  GLUCAP 140* 155* 156* 182* 202*    Recent Results (from the past 240 hour(s))  Culture, blood (routine x 2)     Status: None   Collection Time: 02/03/16  1:10 PM  Result Value Ref Range Status   Specimen Description BLOOD BLOOD LEFT FOREARM  Final   Special Requests BOTTLES DRAWN AEROBIC AND ANAEROBIC 5CC  Final   Culture   Final    NO GROWTH 5 DAYS Performed at [facility]    Report Status 02/08/2016 FINAL  Final  Culture, blood (routine x 2)     Status: None   Collection Time: 02/03/16  1:12 PM  Result Value Ref Range Status   Specimen Description BLOOD BLOOD LEFT FOREARM  Final   Special Requests IN PEDIATRIC BOTTLE 3CC  Final   Culture   Final    NO GROWTH 5 DAYS Performed at [facility]    Report Status 02/08/2016 FINAL  Final  MRSA PCR Screening     Status: None   Collection Time: 02/03/16  6:53 PM  Result Value Ref Range Status   MRSA by PCR NEGATIVE NEGATIVE Final    Comment:        The GeneXpert MRSA Assay (FDA approved for NASAL specimens only), is one component of a comprehensive MRSA colonization surveillance program. It is not intended to diagnose MRSA infection nor to guide or monitor treatment for MRSA infections.   Respiratory virus panel     Status: None   Collection Time:  02/05/16  9:13 AM  Result Value Ref Range Status   Respiratory Syncytial Virus A Negative Negative Final   Respiratory Syncytial Virus B Negative Negative Final   Influenza A Negative Negative Final   Influenza B Negative Negative Final   Parainfluenza 1 Negative Negative Final   Parainfluenza 2 Negative Negative Final   Parainfluenza 3 Negative Negative Final   Metapneumovirus Negative Negative Final   Rhinovirus Negative Negative Final   Adenovirus Negative Negative Final    Comment: (NOTE) Performed At: [facility] [address] [place], [place] [id number] Lindon Romp MD [id number]      Studies: Dg Chest Port 1 View  02/10/2016  CLINICAL DATA:  Shortness of breath. Unresponsive. History of hypertension, diabetes, prostate cancer, former smoker. EXAM: PORTABLE CHEST 1 VIEW COMPARISON:  02/03/2016 FINDINGS: Examination is limited due to patient rotation. Postoperative changes in the mediastinum. Cardiac enlargement without significant vascular congestion. Persistent infiltration and consolidation in the right lung with progression since previous study, likely pneumonia. Small right pleural effusion is developing.  Left lung is clear and expanded. Calcified and tortuous aorta. IMPRESSION: Interval progression of right lung consolidation and infiltration as well as right pleural effusion. Electronically Signed   By: Lucienne Capers M.D.   On: 02/10/2016 05:58    Scheduled Meds: . arformoterol  15 mcg Nebulization BID  . aspirin  81 mg Oral Daily  . budesonide (PULMICORT) nebulizer solution  0.25 mg Nebulization BID  . docusate sodium  100 mg Oral BID  . feeding supplement (PRO-STAT SUGAR FREE 64)  30 mL Oral BID  . ferrous sulfate  325 mg Oral BID WC  . fluticasone  2 spray Each Nare Daily  . furosemide  40 mg Intravenous Q12H  . guaiFENesin  1,200 mg Oral BID  . insulin aspart  0-15 Units Subcutaneous TID WC  . insulin glargine  22 Units Subcutaneous Daily  .  ipratropium-albuterol  3 mL Nebulization QID  . latanoprost  1 drop Both Eyes QHS  . loratadine  10 mg Oral Daily  . methylPREDNISolone (SOLU-MEDROL) injection  40 mg Intravenous Q12H  . oxybutynin  5 mg Oral Daily  . pantoprazole (PROTONIX) IV  40 mg Intravenous Q24H  . piperacillin-tazobactam (ZOSYN)  IV  3.375 g Intravenous Q8H  . protein supplement shake  8 oz Oral BID BM  . sodium chloride flush  3 mL Intravenous Q12H  . vancomycin  1,000 mg Intravenous Q12H   Continuous Infusions: . heparin 1,300 Units/hr (02/11/16 0723)     Time spent: 25 minutes    Danie Hannig  [facility] Pager [phone]. If 7PM-7AM, please contact night-coverage at [email or web address], password [facility] 02/11/2016, 11:36 AM  LOS: 8 days

## 2016-02-11 NOTE — Progress Notes (Signed)
[place] for IV heparin Indication: VTE Treatment  Allergies  Allergen Reactions  . Mold Extract [Trichophyton] Anaphylaxis  . Lipitor [Atorvastatin] Other (See Comments)    Weakness in legs/myalgias    Patient Measurements: Height: 6' (182.9 cm) Weight: 172 lb 13.5 oz (78.4 kg) IBW/kg (Calculated) : 77.6 Heparin Dosing Weight: 82.7kg  Vital Signs: Temp: 98.1 F (36.7 C) (02/19 0400) Temp Source: Axillary (02/19 0400) BP: 114/52 mmHg (02/19 0600)  Labs:  Recent Labs  02/09/16 0557 02/09/16 0746 02/10/16 0702 02/11/16 0329  HGB 9.4*  --  9.7* 8.8*  HCT 29.4*  --  31.3* 27.9*  PLT 315  --  345 317  LABPROT  --   --  18.6*  --   INR  --   --  1.55*  --   HEPARINUNFRC 0.37  --  0.30 0.45  CREATININE  --  1.16 1.26* 1.26*  TROPONINI  --   --  0.18*  --     Estimated Creatinine Clearance: 47 mL/min (by C-G formula based on Cr of 1.26).  Assessment: 55 yoM with COPD on home 02, CAD, CKD, HTN, severe aortic stenosis and recent hospitalization for staph bacteremia and HCAP presents 2/11 with SOB and cough found to have recurrent right-sided PNA and probably parapneumonic effusion with elevated LA and tachycardia, started on antibiotics.  Dopplers now positive for DVT in peroneal veins, left distal popliteal vein, left soleal veins, and left peroneal veins.  VQ scan reveals BL PE (categorized as at least submassive with R heart strain noted). Pharmacy consulted to start IV heparin for VTE treatment.  On 2/17 Pharmacy is also consulted to dose warfarin -> warfarin dc'd 2/18.  PTA on plavix (?not aspirin) though pt reports not taking medication.  Resumed inpatient. Stopped 2/14 by [facility].  Today, 02/11/2016:  Heparin level in target range on 1300units/hr.  CBC: H/H and platelets trended down. No bleeding reported/documented. Aspirin 81mg  daily.  Goal of Therapy:  Heparin level 0.3-0.7 units/ml Monitor platelets by anticoagulation  protocol: Yes   Plan:   Cont heparin at 1300units/hr.  Daily heparin level and CBC.   Romeo Rabon, PharmD, pager [phone]. 02/11/2016,7:13 AM.

## 2016-02-12 ENCOUNTER — Other Ambulatory Visit: Payer: Self-pay | Admitting: *Deleted

## 2016-02-12 ENCOUNTER — Encounter: Payer: Self-pay | Admitting: *Deleted

## 2016-02-12 ENCOUNTER — Inpatient Hospital Stay: Payer: Medicare Other | Admitting: Infectious Diseases

## 2016-02-12 LAB — HEPARIN LEVEL (UNFRACTIONATED): HEPARIN UNFRACTIONATED: 0.39 [IU]/mL (ref 0.30–0.70)

## 2016-02-12 LAB — GLUCOSE, CAPILLARY
GLUCOSE-CAPILLARY: 313 mg/dL — AB (ref 65–99)
GLUCOSE-CAPILLARY: 282 mg/dL — AB (ref 65–99)
GLUCOSE-CAPILLARY: 285 mg/dL — AB (ref 65–99)
Glucose-Capillary: 290 mg/dL — ABNORMAL HIGH (ref 65–99)

## 2016-02-12 LAB — COMPREHENSIVE METABOLIC PANEL
ALBUMIN: 2.3 g/dL — AB (ref 3.5–5.0)
ALT: 90 U/L — ABNORMAL HIGH (ref 17–63)
ANION GAP: 11 (ref 5–15)
AST: 32 U/L (ref 15–41)
Alkaline Phosphatase: 263 U/L — ABNORMAL HIGH (ref 38–126)
BUN: 54 mg/dL — AB (ref 6–20)
CHLORIDE: 101 mmol/L (ref 101–111)
CO2: 24 mmol/L (ref 22–32)
Calcium: 9 mg/dL (ref 8.9–10.3)
Creatinine, Ser: 1.32 mg/dL — ABNORMAL HIGH (ref 0.61–1.24)
GFR calc Af Amer: 55 mL/min — ABNORMAL LOW (ref >60)
GFR calc non Af Amer: 47 mL/min — ABNORMAL LOW (ref >60)
GLUCOSE: 345 mg/dL — AB (ref 65–99)
POTASSIUM: 3.6 mmol/L (ref 3.5–5.1)
Sodium: 136 mmol/L (ref 135–145)
Total Bilirubin: 0.7 mg/dL (ref 0.3–1.2)
Total Protein: 7 g/dL (ref 6.5–8.1)

## 2016-02-12 LAB — VANCOMYCIN, TROUGH: VANCOMYCIN TR: 25 ug/mL — AB (ref 10.0–20.0)

## 2016-02-12 LAB — CBC
HCT: 29.7 % — ABNORMAL LOW (ref 39.0–52.0)
Hemoglobin: 9.2 g/dL — ABNORMAL LOW (ref 13.0–17.0)
MCH: 27.6 pg (ref 26.0–34.0)
MCHC: 31 g/dL (ref 30.0–36.0)
MCV: 89.2 fL (ref 78.0–100.0)
PLATELETS: 411 10*3/uL — AB (ref 150–400)
RBC: 3.33 MIL/uL — ABNORMAL LOW (ref 4.22–5.81)
RDW: 20.1 % — AB (ref 11.5–15.5)
WBC: 11.4 10*3/uL — AB (ref 4.0–10.5)

## 2016-02-12 LAB — PHOSPHORUS: Phosphorus: 3.7 mg/dL (ref 2.5–4.6)

## 2016-02-12 LAB — PROTIME-INR
INR: 1.75 — AB (ref 0.00–1.49)
PROTHROMBIN TIME: 20.4 s — AB (ref 11.6–15.2)

## 2016-02-12 LAB — MAGNESIUM: MAGNESIUM: 1.9 mg/dL (ref 1.7–2.4)

## 2016-02-12 MED ORDER — POLYETHYLENE GLYCOL 3350 17 G PO PACK
17.0000 g | PACK | Freq: Every day | ORAL | Status: DC | PRN
Start: 2016-02-12 — End: 2016-02-13
  Administered 2016-02-12: 17 g via ORAL
  Filled 2016-02-12: qty 1

## 2016-02-12 MED ORDER — FUROSEMIDE 40 MG PO TABS
40.0000 mg | ORAL_TABLET | Freq: Every day | ORAL | Status: DC
  Administered 2016-02-13: 40 mg via ORAL
  Filled 2016-02-12: qty 1

## 2016-02-12 MED ORDER — METHYLPREDNISOLONE SODIUM SUCC 40 MG IJ SOLR
40.0000 mg | INTRAMUSCULAR | Status: DC
  Administered 2016-02-12: 40 mg via INTRAVENOUS
  Filled 2016-02-12: qty 1

## 2016-02-12 MED ORDER — INSULIN GLARGINE 100 UNIT/ML ~~LOC~~ SOLN
28.0000 [IU] | Freq: Every day | SUBCUTANEOUS | Status: DC
  Administered 2016-02-12: 28 [IU] via SUBCUTANEOUS
  Filled 2016-02-12 (×2): qty 0.28

## 2016-02-12 MED ORDER — VANCOMYCIN HCL IN DEXTROSE 750-5 MG/150ML-% IV SOLN
750.0000 mg | Freq: Two times a day (BID) | INTRAVENOUS | Status: DC
  Administered 2016-02-13: 750 mg via INTRAVENOUS
  Filled 2016-02-12 (×2): qty 150

## 2016-02-12 MED ORDER — POTASSIUM CHLORIDE 20 MEQ/15ML (10%) PO SOLN
40.0000 meq | Freq: Once | ORAL | Status: AC
  Administered 2016-02-12: 40 meq via ORAL
  Filled 2016-02-12: qty 30

## 2016-02-12 NOTE — Progress Notes (Signed)
Physical medicine rehabilitation consult requested chart reviewed. Patient recently discharged from [facility] to [facility] skilled nursing facility. Plan remains for skilled nursing facility placement as documented by case management. Hold on formal rehabilitation consult this time plan for skilled nursing facility

## 2016-02-12 NOTE — Progress Notes (Signed)
[facility] PROGRESS NOTE  Douglas Carrillo [id number] DOB: 10-20-1928 DOA: 02/03/2016 PCP: Limmie Patricia, MD  Summary 02/07/16: I have seen and examined Mr Douglas Carrillo at bedside and reviewed his chart. Appreciate cardiology/pulmonary. [name] is a very pleasant 82 year old male being treated for acute respiratory failure with hypoxia resulting from pulmonary embolism/?HCAP/pleural effusion. He has required PRBC transfusion. He is on heparin/broad-spectrum antibiotics. There is no overt bleeding. Anemia concerning his findings long-term anticoagulation is concerned. Will continue anticoagulation/empiric antibiotics and monitor for further evidence of bleeding. Patient still slightly dyspneic therefore will keep in SDU. 02/08/16: [facility]. Patient still has cough and this is concerning. His hemoglobin is reassuringly stable. Cause of transaminitis not clear, abdominal ultrasound pending, agree with holding off on transitioning to oral anticoagulant in view of transaminitis. Will give low-dose aspirin for CVA as patient will be off Plavix. We'll continue antibiotics to complete course as recommended by pulmonary. Patient stable for transfer to telemetry when bed becomes available. 02/09/16: [facility]. Hemoglobin remains stable. Will start Coumadin per pharmacy for target INR 2-3. Transaminitis improved, abdominal ultrasound unrevealing. Transaminitis may be related to medications/infection/congestive hepatopathy. Will give patient doxycycline instead of Zithromax in view of persistent cough. 02/10/16: Noted night developments-episode of unresponsiveness followed by respiratory distress and fever with chest x-ray suggesting worsening consolidation on the right side, white count increasing to 12,400; this despite being on cefepime/doxycycline. Discussed with Dr. Nahser/Dr. Lamonte Sakai. Appreciate cardiology/pulmonary help. Patient not candidate for valve replacement given  persistent infection, not candidate for pleural tap at this point as it is not clear if most of the abnormal chest x-ray findings related to fluid or a combination. It appears that patient has progressive respiratory failure probably related to aspiration pneumonitis/atelectasis. A finding of CVA would not change current management unless intracranial bleed which is not evident at this point. We'll therefore hold off on CT brain, change antibiotics to Vancomycin/Zosyn, start low-dose steroids, hold most of her blood pressure medications for now, continue oxygen supplementation as necessary(patient did not tolerate BiPAP), hold Coumadin for now but continue intravenous heparin, and follow cardiology/pulmonary recommendations-chest PT ordered. I had long discussion with patient's daughter Douglas Carrillo who is hopeful that patient will improve. However, it appears that the multiple conditions including severe pneumonia/pleural effusion/severe aortic stenosis/systolic CHF/use pulmonary embolism/anemia do not potent a good prognosis despite current efforts. It remains to be seen if patient will get significantly better. Morphine helping with comfort, and is also potentially causing respiratory depression. Patient mostly somnolent but responds to verbal stimuli. Patient's condition critical. Time spent for this critical care follow-up is approximately 35 minutes. 02/11/16: Appreciate pulmonary. Patient much more with it and engaging in meaningful conversation. Hb dropped 9.7>>8.8g/dl, wbc better 12.400>>9.600. Fever resolved. No overt bleeding; will keep monitoring for evidence of bleeding, transfuse prbc if Hb drops below 8 g/dl(in view of heart disease). Patient feels better. Has no complaints. We'll keep patient in SDU today given tenuous cardiopulmonary status. 02/12/16:  Appreciate pulmonary. Sugars uncontrolled, likely steroid element. Increase Lantus. Patient constipated, will add MiraLAX. He is otherwise feeling great.  Hemoglobin is stable. Will resume Coumadin per pharmacy for target INR 2-3. Patient's table for transfer to telemetry when bed becomes available. Plan Acute respiratory failure with hypoxia (HCC)/COPD, moderate (HCC)/Sepsis (HCC)/Transaminitis/HCAP (healthcare-associated pneumonia)/Pleural effusion on right  Continue Vancomycin/Zosyn  Reduce Solu-Medrol frequency to daily  Oxygen supplementation/bronchodilators as needed  Chest physical therapy per pulmonary CAD (coronary artery disease) of artery bypass graft/Aortic valve stenosis/acute on Chronic diastolic CHF (congestive heart failure) (  [place]) EF 30%/Valvular cardiomyopathy (HCC)/Essential hypertension  BP stable but renal function remains off  Continue to hold losartan in view of renal insufficiency but resume low dose/Lopressor until oral intake more assured  Patient not suitable candidate for aortic valve replacement at this point given ongoing infection. Agree with holding off procedure indefinitely. Bilateral pulmonary embolism (HCC)/DVT of lower extremity, bilateral (HCC)/Leg DVT (deep venous thromboembolism), acute ([place])  Patient on heparin per pharmacy. Appreciate pharmacy help.  Resume Coumadin for target INR 2-3 per pharmacy. Normocytic anemia/Anemia due to acute blood loss  Hemoglobin  appears stable  Monitor for evidence of bleeding  Transfuse PRBC as needed  May need further investigations if recurrent blood loss Hyperlipidemia with target LDL less than 70/DM2 (diabetes mellitus, type 2) (HCC)/GERD (gastroesophageal reflux disease)  Hemoglobin A1c 6.8  Sugars uncontrolled  Increase Lantus to 28 units  SSI  PPI  Statins on hold in view of transaminitis Acute kidney failure ([place])  Likely due to a combination of factors including diuretics/antibiotics.  Renal function a little worse, will scale back on diuretics-change Lasix to oral History of CVA  Low-dose aspirin  Statins on hold due to  transaminitis Code Status: DNR Family Communication: Daughter at bedside Disposition Plan: SNF eventually, probably end of the week.  Transfer to telemetry when bed becomes available  Consultants:  Pulmonary  Cardiology  Procedures:  prbc transufion  Antibiotics:  Cefepime>>02/10/16  Zithromax>>02/09/16  Doxycycline 02/09/16>>  Vancomycin 02/10/2016>>  Zosyn 02/10/2016>>  HPI/Subjective: Had episode of SOB earlier. Constipated.  Objective: Filed Vitals:   02/12/16 0540 02/12/16 0600  BP:  130/76  Pulse:    Temp: 97.6 F (36.4 C)   Resp:  26    Intake/Output Summary (Last 24 hours) at 02/12/16 0818 Last data filed at 02/12/16 0600  Gross per 24 hour  Intake   1056 ml  Output   1200 ml  Net   -144 ml   Filed Weights   02/10/16 0350 02/11/16 0316 02/12/16 0318  Weight: 78.4 kg (172 lb 13.5 oz) 78.4 kg (172 lb 13.5 oz) 78.1 kg (172 lb 2.9 oz)    Exam:   General:  Comfortable at rest.  Cardiovascular: S1-S2 normal. No murmurs. Pulse regular.  Respiratory: Good air entry bilaterally. No rhonchi or rales.  Abdomen: Soft and nontender. Normal bowel sounds. No organomegaly.  Musculoskeletal: No pedal edema   Neurological: Intact  Data Reviewed: Basic Metabolic Panel:  Recent Labs Lab 02/07/16 0630 02/08/16 0311 02/09/16 0746 02/10/16 0702 02/11/16 0329 02/12/16 0311  NA 136 136 139 136 136 136  K 3.4* 3.7 3.0* 3.1* 3.7 3.6  CL 104 104 102 101 101 101  CO2 21* 23 27 24 23 24   GLUCOSE 149* 152* 117* 168* 215* 345*  BUN 33* 36* 36* 38* 34* 54*  CREATININE 1.27* 1.22 1.16 1.26* 1.26* 1.32*  CALCIUM 8.3* 8.1* 8.6* 8.6* 8.7* 9.0  MG 1.9 1.9  --   --   --  1.9  PHOS  --  2.4*  --   --   --  3.7   Liver Function Tests:  Recent Labs Lab 02/08/16 0510 02/09/16 0746 02/10/16 0702 02/11/16 0329 02/12/16 0311  AST 34 75* 65* 39 32  ALT 13* 189* 155* 113* 90*  ALKPHOS 48 356* 328* 299* 263*  BILITOT 1.0 0.7 0.8 1.0 0.7  PROT 5.3* 6.9 7.2 7.1  7.0  ALBUMIN 3.1* 2.2* 2.4* 2.4* 2.3*   No results for input(s): LIPASE, AMYLASE in the last 168 hours.  No results for input(s): AMMONIA in the last 168 hours. CBC:  Recent Labs Lab 02/08/16 0311 02/09/16 0557 02/10/16 0702 02/11/16 0329 02/12/16 0311  WBC 7.3 6.8 12.4* 9.6 11.4*  HGB 9.4* 9.4* 9.7* 8.8* 9.2*  HCT 28.9* 29.4* 31.3* 27.9* 29.7*  MCV 87.6 87.8 88.4 88.3 89.2  PLT 274 315 345 317 411*   Cardiac Enzymes:  Recent Labs Lab 02/10/16 0702  TROPONINI 0.18*   BNP (last 3 results)  Recent Labs  01/09/16 0704 02/04/16 1435 02/06/16 0815  BNP 338.3* 1553.6* 1933.8*    ProBNP (last 3 results) No results for input(s): PROBNP in the last 8760 hours.  CBG:  Recent Labs Lab 02/11/16 0757 02/11/16 1148 02/11/16 1639 02/11/16 2115 02/12/16 0750  GLUCAP 202* 315* 237* 321* 290*    Recent Results (from the past 240 hour(s))  Culture, blood (routine x 2)     Status: None   Collection Time: 02/03/16  1:10 PM  Result Value Ref Range Status   Specimen Description BLOOD BLOOD LEFT FOREARM  Final   Special Requests BOTTLES DRAWN AEROBIC AND ANAEROBIC 5CC  Final   Culture   Final    NO GROWTH 5 DAYS Performed at [facility]    Report Status 02/08/2016 FINAL  Final  Culture, blood (routine x 2)     Status: None   Collection Time: 02/03/16  1:12 PM  Result Value Ref Range Status   Specimen Description BLOOD BLOOD LEFT FOREARM  Final   Special Requests IN PEDIATRIC BOTTLE 3CC  Final   Culture   Final    NO GROWTH 5 DAYS Performed at [facility]    Report Status 02/08/2016 FINAL  Final  MRSA PCR Screening     Status: None   Collection Time: 02/03/16  6:53 PM  Result Value Ref Range Status   MRSA by PCR NEGATIVE NEGATIVE Final    Comment:        The GeneXpert MRSA Assay (FDA approved for NASAL specimens only), is one component of a comprehensive MRSA colonization surveillance program. It is not intended to diagnose MRSA infection nor  to guide or monitor treatment for MRSA infections.   Respiratory virus panel     Status: None   Collection Time: 02/05/16  9:13 AM  Result Value Ref Range Status   Respiratory Syncytial Virus A Negative Negative Final   Respiratory Syncytial Virus B Negative Negative Final   Influenza A Negative Negative Final   Influenza B Negative Negative Final   Parainfluenza 1 Negative Negative Final   Parainfluenza 2 Negative Negative Final   Parainfluenza 3 Negative Negative Final   Metapneumovirus Negative Negative Final   Rhinovirus Negative Negative Final   Adenovirus Negative Negative Final    Comment: (NOTE) Performed At: [facility] [place], [place] [id number] Lindon Romp MD [id number]      Studies: No results found.  Scheduled Meds: . arformoterol  15 mcg Nebulization BID  . aspirin  81 mg Oral Daily  . budesonide (PULMICORT) nebulizer solution  0.25 mg Nebulization BID  . docusate sodium  100 mg Oral BID  . feeding supplement (PRO-STAT SUGAR FREE 64)  30 mL Oral BID  . ferrous sulfate  325 mg Oral BID WC  . fluticasone  2 spray Each Nare Daily  . furosemide  40 mg Intravenous Q12H  . guaiFENesin  1,200 mg Oral BID  . insulin aspart  0-15 Units Subcutaneous TID WC  . insulin glargine  28 Units Subcutaneous Daily  . ipratropium-albuterol  3 mL Nebulization QID  . latanoprost  1 drop Both Eyes QHS  . loratadine  10 mg Oral Daily  . methylPREDNISolone (SOLU-MEDROL) injection  40 mg Intravenous Q12H  . metoprolol tartrate  12.5 mg Oral BID  . oxybutynin  5 mg Oral Daily  . pantoprazole (PROTONIX) IV  40 mg Intravenous Q24H  . piperacillin-tazobactam (ZOSYN)  IV  3.375 g Intravenous Q8H  . potassium chloride  40 mEq Oral Once  . protein supplement shake  8 oz Oral BID BM  . sodium chloride flush  3 mL Intravenous Q12H  . vancomycin  1,000 mg Intravenous Q12H   Continuous Infusions: . heparin 1,300 Units/hr (02/12/16 0319)     Time  spent: 25 minutes    Ember Gottwald  [facility] Pager [phone]. If 7PM-7AM, please contact night-coverage at [email or web address], password [facility] 02/12/2016, 8:18 AM  LOS: 9 days

## 2016-02-12 NOTE — Progress Notes (Signed)
Pharmacy Antibiotic Note  Douglas Carrillo is a 82 y.o. male admitted on 02/03/2016 with pneumonia.  Pharmacy was consulted for vancomycin and cefepime dosing. Patient also has BL PE.  Recently completed course of vancomycin for MRSA bacteremia.  2/18: patient had an episode of hypoxia and unresponsiveness. Pharmacy is asked to start Vanc and Zosyn for suspected aspiration pneumonia.  Plan:  Vanc trough above target range 15-71mcg/ml and SCr has been worsening.   Reduce Vanc to 750mg  IV q12h starting in the AM 2/21.  Cont Zosyn 3.375g IV Q8H infused over 4hrs.  Re-check Vanc trough at steady state or if SCr cont to rise.   Follow up renal fxn, culture results, and clinical course.  Height: 6' (182.9 cm) Weight: 172 lb 2.9 oz (78.1 kg) IBW/kg (Calculated) : 77.6  Temp (24hrs), Avg:97.7 F (36.5 C), Min:97.1 F (36.2 C), Max:98.3 F (36.8 C)   Recent Labs Lab 02/08/16 0311 02/09/16 0557 02/09/16 0746 02/10/16 0702 02/11/16 0329 02/12/16 0311 02/12/16 0943  WBC 7.3 6.8  --  12.4* 9.6 11.4*  --   CREATININE 1.22  --  1.16 1.26* 1.26* 1.32*  --   VANCOTROUGH  --   --   --   --   --   --  25*    Estimated Creatinine Clearance: 44.9 mL/min (by C-G formula based on Cr of 1.32).    Allergies  Allergen Reactions  . Mold Extract [Trichophyton] Anaphylaxis  . Lipitor [Atorvastatin] Other (See Comments)    Weakness in legs/myalgias   Antimicrobials this admission: 2/11 >> Vanc >> 2/14, 2/18 >> Vanc >> 2/11 >> Cefepime x8d >> 2/18 2/12 >> Azithromycin >> 2/17 2/17 >> Doxy >> 2/18 2/18 >> Zosyn >>  Levels/dose changes this admission: Previous admission 1/21 VT = 14 on 750mg  q12h Previous admission 1/24 VT = 18 on 1g q12h 2/20: VT = 25 on 1g q12h  Microbiology Results: 1/17 BCx2: MRSA (Vanc MIC < 0.5) 2/11 MRSA PCR: negative (positive on prev admission with MRSA bacteremia) 2/11 BCx: NGF 2/13 resp virus panel: neg 2/13 sputum: not collected 2/13 Legionella:  negative 2/13 Strep: negative  Thank you for allowing pharmacy to be a part of this patient's care.  Romeo Rabon, PharmD, pager [phone]. 02/12/2016,10:53 AM.

## 2016-02-12 NOTE — Progress Notes (Signed)
Date: February 12, 2016 Chart reviewed for concurrent status and case management needs. Will continue to follow patient for changes and needs:  Continues to require hfnc to keep o2 above 89% Velva Harman, BSN, Therapist, sports, [place]   [phone]

## 2016-02-12 NOTE — Patient Outreach (Signed)
[facility]) Care Management  02/12/2016  Cambell Yeakey [birth year] [id number]   CSW will perform a case closure on patient, due to patient being hospitalized inpatient for greater than 8 consecutive inpatient hospital days.   CSW will notify patient's RNCM with [facility], Raina Mina of CSW's plans to close patient's case. CSW will fax a correspondence letter and a case closure letter to patient's Primary Care Physician, Dr. [name] to ensure that Dr. Elyse Hsu is aware of CSW's involvement with patient. CSW will submit a case closure request to Lurline Del, Care Management Assistant with [facility], in the form of an In [employer].   Nat Christen, BSW, MSW, LCSW  Licensed Education officer, [facility]  Mailing [place] [address], [place], [place] [address] Physical Address-[address], [place],  [address] Toll Free Main # [phone] Fax # [phone] Cell # [phone]  Fax # [phone]  [email or web address]

## 2016-02-12 NOTE — Progress Notes (Signed)
Physical Therapy Treatment Patient Details Name: Douglas Carrillo MRN: [id number] DOB: 02/16/1928 Today's Date: 02/12/2016    History of Present Illness 82 year old male being treated for acute respiratory failure with hypoxia resulting from pulmonary embolism/HCAP/pleural effusion with PMHx of recent left CVA with residual right deficits, HTN, DM, CAD, CABG, COPD, CKD, and prostate cancer.    PT Comments    Pt stood with flexed posture with RW and pivoted to recliner with +2 mod assist. Activity tolerance limited by SOB/fatigue, SaO2 88% on RA with activity. Instructed pt in BLE strengthening exercises to be done independently.   Follow Up Recommendations  SNF;Supervision/Assistance - 24 hour     Equipment Recommendations  None recommended by PT    Recommendations for Other Services       Precautions / Restrictions Precautions Precautions: Fall Precaution Comments: R sided hemiparesis, monitor sats Restrictions Weight Bearing Restrictions: No    Mobility  Bed Mobility Overal bed mobility: Needs Assistance Bed Mobility: Supine to Sit     Supine to sit: Min assist     General bed mobility comments: Min A to raise trunk and scoot to EOB  Transfers Overall transfer level: Needs assistance Equipment used: Rolling walker (2 wheeled) Transfers: Sit to/from Stand Sit to Stand: Mod assist;+2 physical assistance;+2 safety/equipment Stand pivot transfers: Mod assist;+2 physical assistance;+2 safety/equipment       General transfer comment: assist for rise and steadying as well as controlling descent, pt tends to leave R LE behind so educated daughter and RN to transfer to pt's stronger left side (for getting back into bed); pt has residual R sided weakness post-CVA however this is worse than typical; SaO2 88% on RA with activity, 3/4 dyspnea. Pt stands with flexed posture, with verbal cues he's able to stand nearly fully upright.   Ambulation/Gait                  Stairs            Wheelchair Mobility    Modified Rankin (Stroke Patients Only)       Balance             Standing balance-Leahy Scale: Poor                      Cognition Arousal/Alertness: Awake/alert Behavior During Therapy: WFL for tasks assessed/performed Overall Cognitive Status: Within Functional Limits for tasks assessed                      Exercises General Exercises - Lower Extremity Ankle Circles/Pumps: AROM;Both;10 reps Quad Sets: AROM;Both;5 reps;Supine Gluteal Sets: AROM;Both;5 reps;Supine    General Comments        Pertinent Vitals/Pain Pain Assessment: No/denies pain    Home Living                      Prior Function            PT Goals (current goals can now be found in the care plan section) Acute Rehab PT Goals Patient Stated Goal: to get stronger PT Goal Formulation: With patient/family Time For Goal Achievement: 02/23/16 Potential to Achieve Goals: Good Progress towards PT goals: Progressing toward goals    Frequency  Min 3X/week    PT Plan Current plan remains appropriate    Co-evaluation             End of Session Equipment Utilized During Treatment: Gait belt;Oxygen Activity Tolerance: Patient limited by fatigue  Patient left: in chair;with call bell/phone within reach;with family/visitor present     Time: [id number] PT Time Calculation (min) (ACUTE ONLY): 28 min  Charges:  $Therapeutic Exercise: 8-22 mins $Therapeutic Activity: 8-22 mins                    G Codes:      Philomena Doheny 02/12/2016, 12:55 PM [phone]

## 2016-02-12 NOTE — Progress Notes (Signed)
Date: February 12, 2016 Chart reviewed for concurrent status and case management needs. Will continue to follow patient for changes and needs: Referral to ltach performed. Velva Harman, BSN, [place], [place]   [phone]

## 2016-02-12 NOTE — Progress Notes (Signed)
Inpatient Diabetes Program Recommendations  AACE/ADA: New Consensus Statement on Inpatient Glycemic Control (2015)  Target Ranges:  Prepandial:   less than 140 mg/dL      Peak postprandial:   less than 180 mg/dL (1-2 hours)      Critically ill patients:  140 - 180 mg/dL   Review of Glycemic Control  Results for Douglas Carrillo, Douglas Carrillo (MRN [id number]) as of 02/12/2016 10:43  Ref. Range 02/11/2016 07:57 02/11/2016 11:48 02/11/2016 16:39 02/11/2016 21:15 02/12/2016 07:50  Glucose-Capillary Latest Ref Range: 65-99 mg/dL 202 (H) 315 (H) 237 (H) 321 (H) 290 (H)   Likely steroid-induced hyperglycemia. Lantus increased to 28 units. May benefit from addition of meal coverage insulin. Pt on meal coverage insulin at home.  Inpatient Diabetes Program Recommendations:    Add Novolog 4 units tidwc - If blood sugar > 150 mg/dL  Will continue to follow. Thank you. Lorenda Peck, RD, LDN, CDE Inpatient Diabetes Coordinator [phone]

## 2016-02-12 NOTE — Progress Notes (Signed)
[facility] for IV heparin Indication: VTE Treatment  Allergies  Allergen Reactions  . Mold Extract [Trichophyton] Anaphylaxis  . Lipitor [Atorvastatin] Other (See Comments)    Weakness in legs/myalgias    Patient Measurements: Height: 6' (182.9 cm) Weight: 172 lb 2.9 oz (78.1 kg) IBW/kg (Calculated) : 77.6 Heparin Dosing Weight: 82.7kg  Vital Signs: Temp: 97.6 F (36.4 C) (02/20 0540) Temp Source: Oral (02/20 0540) BP: 130/76 mmHg (02/20 0600)  Labs:  Recent Labs  02/10/16 0702 02/11/16 0329 02/12/16 0311  HGB 9.7* 8.8* 9.2*  HCT 31.3* 27.9* 29.7*  PLT 345 317 411*  LABPROT 18.6*  --  20.4*  INR 1.55*  --  1.75*  HEPARINUNFRC 0.30 0.45 0.39  CREATININE 1.26* 1.26* 1.32*  TROPONINI 0.18*  --   --     Estimated Creatinine Clearance: 44.9 mL/min (by C-G formula based on Cr of 1.32).  Assessment: 31 yoM with COPD on home 02, CAD, CKD, HTN, severe aortic stenosis and recent hospitalization for staph bacteremia and HCAP presents 2/11 with SOB and cough found to have recurrent right-sided PNA and probably parapneumonic effusion with elevated LA and tachycardia, started on antibiotics.  Dopplers now positive for DVT in peroneal veins, left distal popliteal vein, left soleal veins, and left peroneal veins.  VQ scan reveals BL PE (categorized as at least submassive with R heart strain noted). Pharmacy consulted to start IV heparin for VTE treatment.   PTA on plavix (?not aspirin) though pt reports not taking medication.  Resumed inpatient. Stopped 2/14 by TRH.  On 2/17 Pharmacy is also consulted to dose warfarin -> warfarin dc'd 2/18.  Today, 02/12/2016:  Heparin level in target range on 1300units/hr.  CBC better.  No bleeding reported/documented. Aspirin 81mg  daily.  Goal of Therapy:  Heparin level 0.3-0.7 units/ml Monitor platelets by anticoagulation protocol: Yes   Plan:   Cont heparin at 1300units/hr.  Daily heparin level  and CBC.   Romeo Rabon, PharmD, pager [phone]. 02/12/2016,7:43 AM.

## 2016-02-13 ENCOUNTER — Inpatient Hospital Stay
Admission: AD | Admit: 2016-02-13 | Discharge: 2016-02-21 | Disposition: E | Payer: Self-pay | Source: Ambulatory Visit | Attending: Internal Medicine | Admitting: Internal Medicine

## 2016-02-13 DIAGNOSIS — Z452 Encounter for adjustment and management of vascular access device: Secondary | ICD-10-CM

## 2016-02-13 LAB — CBC
HCT: 28.8 % — ABNORMAL LOW (ref 39.0–52.0)
Hemoglobin: 8.9 g/dL — ABNORMAL LOW (ref 13.0–17.0)
MCH: 27.6 pg (ref 26.0–34.0)
MCHC: 30.9 g/dL (ref 30.0–36.0)
MCV: 89.2 fL (ref 78.0–100.0)
PLATELETS: 403 10*3/uL — AB (ref 150–400)
RBC: 3.23 MIL/uL — ABNORMAL LOW (ref 4.22–5.81)
RDW: 20 % — AB (ref 11.5–15.5)
WBC: 7.8 10*3/uL (ref 4.0–10.5)

## 2016-02-13 LAB — BASIC METABOLIC PANEL
Anion gap: 11 (ref 5–15)
BUN: 49 mg/dL — AB (ref 6–20)
CALCIUM: 8.8 mg/dL — AB (ref 8.9–10.3)
CO2: 24 mmol/L (ref 22–32)
CREATININE: 1.24 mg/dL (ref 0.61–1.24)
Chloride: 101 mmol/L (ref 101–111)
GFR calc Af Amer: 59 mL/min — ABNORMAL LOW (ref >60)
GFR, EST NON AFRICAN AMERICAN: 51 mL/min — AB (ref >60)
GLUCOSE: 298 mg/dL — AB (ref 65–99)
POTASSIUM: 3.9 mmol/L (ref 3.5–5.1)
SODIUM: 136 mmol/L (ref 135–145)

## 2016-02-13 LAB — PROTIME-INR
INR: 1.57 — ABNORMAL HIGH (ref 0.00–1.49)
PROTHROMBIN TIME: 18.8 s — AB (ref 11.6–15.2)

## 2016-02-13 LAB — GLUCOSE, CAPILLARY
GLUCOSE-CAPILLARY: 287 mg/dL — AB (ref 65–99)
Glucose-Capillary: 353 mg/dL — ABNORMAL HIGH (ref 65–99)
Glucose-Capillary: 198 mg/dL — ABNORMAL HIGH (ref 65–99)

## 2016-02-13 LAB — HEPARIN LEVEL (UNFRACTIONATED)
Heparin Unfractionated: 0.49 IU/mL (ref 0.30–0.70)
Heparin Unfractionated: 0.38 IU/mL (ref 0.30–0.70)

## 2016-02-13 LAB — VANCOMYCIN, TROUGH: Vancomycin Tr: 22 ug/mL — ABNORMAL HIGH (ref 10.0–20.0)

## 2016-02-13 MED ORDER — HEPARIN (PORCINE) IN NACL 100-0.45 UNIT/ML-% IJ SOLN: 1300.0000 [IU]/h | INTRAMUSCULAR | Status: AC

## 2016-02-13 MED ORDER — LEVALBUTEROL HCL 0.63 MG/3ML IN NEBU: 0.6300 mg | INHALATION_SOLUTION | Freq: Four times a day (QID) | RESPIRATORY_TRACT | Status: AC | PRN

## 2016-02-13 MED ORDER — BUDESONIDE 0.25 MG/2ML IN SUSP: 0.2500 mg | Freq: Two times a day (BID) | RESPIRATORY_TRACT | Status: AC

## 2016-02-13 MED ORDER — PANTOPRAZOLE SODIUM 40 MG PO TBEC: 40.0000 mg | DELAYED_RELEASE_TABLET | Freq: Every day | ORAL | Status: DC

## 2016-02-13 MED ORDER — HYDROCODONE-ACETAMINOPHEN 5-325 MG PO TABS: 1.0000 | ORAL_TABLET | ORAL | Status: AC | PRN

## 2016-02-13 MED ORDER — ASPIRIN 81 MG PO CHEW: 81.0000 mg | CHEWABLE_TABLET | Freq: Every day | ORAL | Status: AC

## 2016-02-13 MED ORDER — ARFORMOTEROL TARTRATE 15 MCG/2ML IN NEBU: 15.0000 ug | INHALATION_SOLUTION | Freq: Two times a day (BID) | RESPIRATORY_TRACT | Status: AC

## 2016-02-13 MED ORDER — DOCUSATE SODIUM 100 MG PO CAPS: 100.0000 mg | ORAL_CAPSULE | Freq: Two times a day (BID) | ORAL | Status: AC

## 2016-02-13 MED ORDER — WARFARIN SODIUM 2 MG PO TABS: 2.0000 mg | ORAL_TABLET | Freq: Every day | ORAL | Status: AC

## 2016-02-13 MED ORDER — VANCOMYCIN HCL IN DEXTROSE 750-5 MG/150ML-% IV SOLN: 750.0000 mg | Freq: Two times a day (BID) | INTRAVENOUS | Status: AC

## 2016-02-13 MED ORDER — IPRATROPIUM-ALBUTEROL 0.5-2.5 (3) MG/3ML IN SOLN: 3.0000 mL | Freq: Four times a day (QID) | RESPIRATORY_TRACT | Status: AC

## 2016-02-13 MED ORDER — PREMIER PROTEIN SHAKE: 8.0000 [oz_av] | Freq: Two times a day (BID) | ORAL | Status: AC

## 2016-02-13 MED ORDER — METHYLPREDNISOLONE SODIUM SUCC 40 MG IJ SOLR: 40.0000 mg | INTRAMUSCULAR | Status: AC

## 2016-02-13 MED ORDER — FLUTICASONE PROPIONATE 50 MCG/ACT NA SUSP: 2.0000 | Freq: Every day | NASAL | Status: AC

## 2016-02-13 MED ORDER — WARFARIN - PHARMACIST DOSING INPATIENT: Freq: Every day | Status: DC

## 2016-02-13 MED ORDER — METOPROLOL TARTRATE 25 MG PO TABS: 12.5000 mg | ORAL_TABLET | Freq: Two times a day (BID) | ORAL | Status: AC

## 2016-02-13 MED ORDER — GUAIFENESIN ER 600 MG PO TB12: 1200.0000 mg | ORAL_TABLET | Freq: Two times a day (BID) | ORAL | Status: AC

## 2016-02-13 MED ORDER — PRO-STAT SUGAR FREE PO LIQD: 30.0000 mL | Freq: Two times a day (BID) | ORAL | Status: AC

## 2016-02-13 MED ORDER — PIPERACILLIN-TAZOBACTAM 3.375 G IVPB: 3.3750 g | Freq: Three times a day (TID) | INTRAVENOUS | Status: AC

## 2016-02-13 MED ORDER — POLYETHYLENE GLYCOL 3350 17 G PO PACK: 17.0000 g | PACK | Freq: Every day | ORAL | Status: AC | PRN

## 2016-02-13 MED ORDER — WARFARIN SODIUM 2.5 MG PO TABS
2.5000 mg | ORAL_TABLET | Freq: Once | ORAL | Status: AC
  Administered 2016-02-13: 2.5 mg via ORAL
  Filled 2016-02-13: qty 1

## 2016-02-13 NOTE — Discharge Summary (Signed)
Douglas Carrillo, is a 82 y.o. male  DOB 1928/11/29  MRN [id number].  Admission date:  02/03/2016  Admitting Physician  Bonnielee Haff, MD  Discharge Date:  02/01/2016   Primary MD  Limmie Patricia, MD  Recommendations for primary care physician for things to follow:  Please monitor renal function, INR target 2-3 for DVT/PE, continue antibiotics until 02/18/2016.  Admission Diagnosis   HCAP (healthcare-associated pneumonia) [J18.9]   Discharge Diagnosis  HCAP (healthcare-associated pneumonia) [J18.9]   Principal Problem:   Acute respiratory failure with hypoxia ([place]) Active Problems:   CAD (coronary artery disease) of artery bypass graft   Aortic valve stenosis   COPD, moderate (HCC)   Sepsis ([place])   HCAP (healthcare-associated pneumonia)   Pleural effusion on right   Chronic diastolic CHF (congestive heart failure) (HCC)   Transaminitis   Valvular cardiomyopathy (HCC)   Bilateral pulmonary embolism (HCC)   DVT of lower extremity, bilateral (HCC)   Normocytic anemia   Hyperlipidemia with target LDL less than 70   Essential hypertension   DM2 (diabetes mellitus, type 2) (HCC)   Acute kidney failure (HCC)   GERD (gastroesophageal reflux disease)   Stroke (cerebrum) (HCC)   Leg DVT (deep venous thromboembolism), acute (HCC)   Anemia due to acute blood loss   Acute combined systolic and diastolic heart failure ([place] [employer])   Demand ischemia of myocardium ([place])      Summary Douglas Carrillo is a very pleasant 82 y.o. male with essential hypertension, coronary artery disease, chronic respiratory failure oxygen dependent tube to COPD, chronic kidney disease, severe aortic stenosis, history of CVA, recent stay in the hospital for Staphylococcus aureus bacteremia and healthcare associated pneumonia,  who presented from his skilled nursing facility with complaints of shortness of breath, cough and fever. Subsequently, he was found to be septic, to have bilateral DVT with bilateral pulmonary embolism causing right ventricular strain, have acute decompensation of CHF with an EF of 25-30%(an acute change from December 2016 when the EF was 50-55%), and to have bilateral pneumonia with moderate right-sided pleural effusion among other problems. He was therefore managed in the intensive care unit with the assistance of cardiology/critical care. He required PRBC transfusion and there was no overt bleeding witnessed. Patient needs aortic valve replacement but cardiology recommended to hold off procedure until patient can handle it-indefinitely for now given ongoing infection and anticoagulation. His hospitalization was complicated by episodes of acute respiratory failure likely resulting from a multitude of factors including pulmonary embolism/pneumonia which hasn't completely cleared. There is a possibility he aspirated. Patient is now on heparin drip/Coumadin but his INR is not yet therapeutic, target 2-3. He needs antibiotics until 02/18/16. He was taken off Plavix but will be on low-dose aspirin given the fact that he will be on Coumadin. Regarding right-sided pleural effusion, pulmonary felt like there was not enough fluid to drain therefore this was not pursued during this hospital stay-especially as patient is anticoagulated. He has been accepted to [facility] where he will continue current management and  get physical therapy. I have discussed discharge plans with patient's daughter and niece at the bedside and they are agreeable with the plan. Please refer to the daily progress notes below for details of patient's hospital stay;  Hospital Course  02/07/16: I have seen and examined Douglas Carrillo at bedside and reviewed his chart. Appreciate cardiology/pulmonary. Douglas Carrillo is a very pleasant 82 year old male being  treated for acute respiratory failure with hypoxia resulting from pulmonary embolism/?HCAP/pleural effusion. He has required PRBC transfusion. He is on heparin/broad-spectrum antibiotics. There is no overt bleeding. Anemia concerning his findings long-term anticoagulation is concerned. Will continue anticoagulation/empiric antibiotics and monitor for further evidence of bleeding. Patient still slightly dyspneic therefore will keep in SDU. 02/08/16: [place] cardiology. Patient still has cough and this is concerning. His hemoglobin is reassuringly stable. Cause of transaminitis not clear, abdominal ultrasound pending, agree with holding off on transitioning to oral anticoagulant in view of transaminitis. Will give low-dose aspirin for CVA as patient will be off Plavix. We'll continue antibiotics to complete course as recommended by pulmonary. Patient stable for transfer to telemetry when bed becomes available. 02/09/16: [place] cardiology. Hemoglobin remains stable. Will start Coumadin per pharmacy for target INR 2-3. Transaminitis improved, abdominal ultrasound unrevealing. Transaminitis may be related to medications/infection/congestive hepatopathy. Will give patient doxycycline instead of Zithromax in view of persistent cough. 02/10/16: Noted night developments-episode of unresponsiveness followed by respiratory distress and fever with chest x-ray suggesting worsening consolidation on the right side, white count increasing to 12,400; this despite being on cefepime/doxycycline. Discussed with Dr. Nahser/Dr. Lamonte Sakai. Appreciate cardiology/pulmonary help. Patient not candidate for valve replacement given persistent infection, not candidate for pleural tap at this point as it is not clear if most of the abnormal chest x-ray findings related to fluid or a combination. It appears that patient has progressive respiratory failure probably related to aspiration pneumonitis/atelectasis. A finding of CVA would not change  current management unless intracranial bleed which is not evident at this point. We'll therefore hold off on CT brain, change antibiotics to Vancomycin/Zosyn, start low-dose steroids, hold most of her blood pressure medications for now, continue oxygen supplementation as necessary(patient did not tolerate BiPAP), hold Coumadin for now but continue intravenous heparin, and follow cardiology/pulmonary recommendations-chest PT ordered. I had long discussion with patient's daughter Douglas Carrillo who is hopeful that patient will improve. However, it appears that the multiple conditions including severe pneumonia/pleural effusion/severe aortic stenosis/systolic CHF/use pulmonary embolism/anemia do not potent a good prognosis despite current efforts. It remains to be seen if patient will get significantly better. Morphine helping with comfort, and is also potentially causing respiratory depression. Patient mostly somnolent but responds to verbal stimuli. Patient's condition critical. Time spent for this critical care follow-up is approximately 35 minutes. 02/11/16: Appreciate pulmonary. Patient much more with it and engaging in meaningful conversation. Hb dropped 9.7>>8.8g/dl, wbc better 12.400>>9.600. Fever resolved. No overt bleeding; will keep monitoring for evidence of bleeding, transfuse prbc if Hb drops below 8 g/dl(in view of heart disease). Patient feels better. Has no complaints. We'll keep patient in SDU today given tenuous cardiopulmonary status. 02/12/16: Appreciate pulmonary. Sugars uncontrolled, likely steroid element. Increase Lantus. Patient constipated, will add MiraLAX. He is otherwise feeling great. Hemoglobin is stable. Will resume Coumadin per pharmacy for target INR 2-3. Patient's table for transfer to telemetry when bed becomes available. 02/03/2016: Feels better, will transition to LTAC.  Discharge Condition Stable.  Consults obtained  Pulmonary and critical care Cardiology  Follow UP  cardiology  outpatient   Discharge  Instructions  and  Discharge Medications  Discharge Instructions    Diet - low sodium heart healthy    Complete by:  As directed      Diet Carb Modified    Complete by:  As directed      Increase activity slowly    Complete by:  As directed             Medication List    STOP taking these medications        acetaminophen 325 MG tablet  Commonly known as:  TYLENOL     clopidogrel 75 MG tablet  Commonly known as:  PLAVIX     CRESTOR 20 MG tablet  Generic drug:  rosuvastatin     insulin lispro 100 UNIT/ML injection  Commonly known as:  HUMALOG     losartan-hydrochlorothiazide 100-25 MG tablet  Commonly known as:  HYZAAR     metoprolol succinate 100 MG 24 hr tablet  Commonly known as:  TOPROL-XL     moxifloxacin 400 MG tablet  Commonly known as:  AVELOX     PROAIR HFA 108 (90 Base) MCG/ACT inhaler  Generic drug:  albuterol      TAKE these medications        AEROCHAMBER PLUS FLO-VU Misc     arformoterol 15 MCG/2ML Nebu  Commonly known as:  BROVANA  Take 2 mLs (15 mcg total) by nebulization 2 (two) times daily.     aspirin 81 MG chewable tablet  Chew 1 tablet (81 mg total) by mouth daily.     b complex vitamins capsule  Take 1 capsule by mouth daily.     B-D ULTRAFINE III SHORT PEN 31G X 8 MM Misc  Generic drug:  Insulin Pen Needle  Reported on 01/18/2016     benzonatate 100 MG capsule  Commonly known as:  TESSALON  Take 100 mg by mouth 3 (three) times daily.     budesonide 0.25 MG/2ML nebulizer solution  Commonly known as:  PULMICORT  Take 2 mLs (0.25 mg total) by nebulization 2 (two) times daily.     CENTRUM SILVER ULTRA MENS PO  Take 1 tablet by mouth daily.     docusate sodium 100 MG capsule  Commonly known as:  COLACE  Take 1 capsule (100 mg total) by mouth 2 (two) times daily.     feeding supplement (PRO-STAT SUGAR FREE 64) Liqd  Take 30 mLs by mouth 2 (two) times daily.     feeding supplement (PRO-STAT SUGAR FREE  64) Liqd  Take 30 mLs by mouth 2 (two) times daily.     ferrous sulfate 325 (65 FE) MG tablet  Take 325 mg by mouth 2 (two) times daily with a meal.     fluticasone 50 MCG/ACT nasal spray  Commonly known as:  FLONASE  Place 2 sprays into both nostrils daily.     furosemide 40 MG tablet  Commonly known as:  LASIX  TAKE 1 TABLET DAILY     guaiFENesin 600 MG 12 hr tablet  Commonly known as:  MUCINEX  Take 2 tablets (1,200 mg total) by mouth 2 (two) times daily.     heparin 100-0.45 UNIT/ML-% infusion  Inject 1,300 Units/hr into the vein continuous.     HYDROcodone-acetaminophen 5-325 MG tablet  Commonly known as:  NORCO/VICODIN  Take 1-2 tablets by mouth every 4 (four) hours as needed for moderate pain.     HYDROcodone-homatropine 5-1.5 MG/5ML syrup  Commonly known as:  HYCODAN  Take 5  mLs by mouth every 6 (six) hours as needed for cough.     insulin aspart 100 UNIT/ML injection  Commonly known as:  novoLOG  Correction coverage:Moderate (average weight, post-op) CBG < 70:implement hypoglycemia protocol CBG 70 - 120:0 units CBG 121 - 150:2 units CBG 151 - 200:3 units CBG 201 - 250:5 units CBG 251 - 300:8 units CBG 301 - 350:11 units CBG 351 - 400:15 units CBG > 400call MD and obtain STAT lab verification     insulin glargine 100 UNIT/ML injection  Commonly known as:  LANTUS  Inject 0.25 mLs (25 Units total) into the skin daily.     ipratropium-albuterol 0.5-2.5 (3) MG/3ML Soln  Commonly known as:  DUONEB  Take 3 mLs by nebulization 4 (four) times daily.     latanoprost 0.005 % ophthalmic solution  Commonly known as:  XALATAN  Place 1 drop into both eyes at bedtime.     levalbuterol 0.63 MG/3ML nebulizer solution  Commonly known as:  XOPENEX  Take 3 mLs (0.63 mg total) by nebulization every 6 (six) hours as needed for wheezing or shortness of breath.     Magnesium Oxide -Mg Supplement 250 MG Tabs  Take 250 mg by mouth every morning.      methylPREDNISolone sodium succinate 40 mg/mL injection  Commonly known as:  SOLU-MEDROL  Inject 1 mL (40 mg total) into the vein daily.     metoprolol tartrate 25 MG tablet  Commonly known as:  LOPRESSOR  Take 0.5 tablets (12.5 mg total) by mouth 2 (two) times daily.     ONETOUCH DELICA LANCETS [id number] Misc  1 each by Other route 2 (two) times daily. Reported on 01/18/2016     [facility] VERIO test strip  Generic drug:  glucose blood  1 strip by Other route 2 (two) times daily. Reported on 01/18/2016     oxybutynin 5 MG 24 hr tablet  Commonly known as:  DITROPAN-XL  Take 5 mg by mouth daily.     pantoprazole 40 MG tablet  Commonly known as:  PROTONIX  Take 40 mg by mouth daily.     pioglitazone 45 MG tablet  Commonly known as:  ACTOS  Take 45 mg by mouth daily.     piperacillin-tazobactam 3.375 GM/50ML IVPB  Commonly known as:  ZOSYN  Inject 50 mLs (3.375 g total) into the vein every 8 (eight) hours.     polyethylene glycol packet  Commonly known as:  MIRALAX / GLYCOLAX  Take 17 g by mouth daily as needed for moderate constipation.     potassium chloride SA 20 MEQ tablet  Commonly known as:  KLOR-CON M20  Take 3 tablets daily.     protein supplement shake Liqd  Commonly known as:  PREMIER PROTEIN  Take 237 mLs (8 oz total) by mouth 2 (two) times daily between meals.     RESTASIS 0.05 % ophthalmic emulsion  Generic drug:  cycloSPORINE  Place 1 drop into both eyes 2 (two) times daily.     STIOLTO RESPIMAT 2.5-2.5 MCG/ACT Aers  Generic drug:  Tiotropium Bromide-Olodaterol  USE 2 INHALATIONS ORALLY   DAILY     Vancomycin 750 MG/150ML Soln  Commonly known as:  VANCOCIN  Inject 150 mLs (750 mg total) into the vein every 12 (twelve) hours.     vitamin C 500 MG tablet  Commonly known as:  ASCORBIC ACID  Take 500 mg by mouth 2 (two) times daily.     Vitamin D (Ergocalciferol) 50000 units Caps  capsule  Commonly known as:  DRISDOL  Take 50,000 Units by mouth every 14  (fourteen) days. Every other week on Sunday     vitamin E 400 UNIT capsule  Take 400 Units by mouth daily.     warfarin 2 MG tablet  Commonly known as:  COUMADIN  Take 1 tablet (2 mg total) by mouth daily.     ZETIA 10 MG tablet  Generic drug:  ezetimibe  Take 10 mg by mouth daily.        Diet and Activity recommendation: See Discharge Instructions above  Major procedures and Radiology Reports - PLEASE review detailed and final reports for all details, in brief -    Dg Chest 2 View  02/03/2016  CLINICAL DATA:  82 year old nursing home patient presenting with acute onset of cough, fever, shortness of breath and chest pain which began last night. EXAM: CHEST  2 VIEW COMPARISON:  01/09/2016 and earlier. FINDINGS: AP semi-erect and lateral images were obtained. Prior sternotomy for CABG. Cardiac silhouette markedly enlarged, unchanged. Thoracic aorta atherosclerotic and tortuous, unchanged. Hilar and mediastinal contours otherwise unremarkable. Dense airspace consolidation in the right upper lobe, right lower lobe and right middle lobe associated with a moderate-sized right pleural effusion. Minimal patchy airspace opacities in the left lower lobe. Mild pulmonary venous hypertension without overt edema. IMPRESSION: Acute pneumonia involving the right upper lobe, right middle lobe, right lower lobe and to a lesser degree the left lower lobe. Moderate size right parapneumonic effusion. Electronically Signed   By: Evangeline Dakin M.D.   On: 02/03/2016 14:03   Ct Angio Chest Pe W/cm &/or Wo Cm  02/04/2016  CLINICAL DATA:  82 year old male with chest pain, shortness of breath and lower extremity DVT. EXAM: CT ANGIOGRAPHY CHEST WITH CONTRAST TECHNIQUE: Multidetector CT imaging of the chest was performed using the standard protocol during bolus administration of intravenous contrast. Multiplanar CT image reconstructions and MIPs were obtained to evaluate the vascular anatomy. CONTRAST:  156mL  OMNIPAQUE IOHEXOL 350 MG/ML SOLN COMPARISON:  02/03/2016 and prior radiographs. 10/24/2014 noncontrast chest CT. FINDINGS: This is a technically satisfactory study. Mediastinum/Nodes: Pulmonary emboli are identified within the distal main right pulmonary artery, right upper and lower lobes and left lower lobe. RV/LV ratio is 1.1, compatible with right heart strain. Cardiomegaly, coronary disease and cardiac surgical changes noted. There is no evidence of thoracic aortic aneurysm. No pericardial effusion or enlarged lymph nodes identified. Lungs/Pleura: A moderate right pleural effusion is identified. Focal consolidation within the right upper lobe and right lower lobe noted and may represent atelectasis/ infarct changes and/or infection. Moderate centrilobular and paraseptal emphysema noted. Patchy left lower lobe basilar opacity probably represents atelectasis/infarct changes. There is no evidence of pneumothorax. Upper abdomen: No acute abnormalities. Musculoskeletal: No acute or suspicious abnormalities. Review of the MIP images confirms the above findings. IMPRESSION: Bilateral pulmonary emboli with CT evidence of right heart strain (RV/LV Ratio = 1.1) consistent with at least submassive (intermediate risk) PE. The presence of right heart strain has been associated with an increased risk of morbidity and mortality. Please activate Code PE by paging [phone]. Scattered right lung consolidation and mild left basilar opacity - question atelectasis/infarct changes and/ or pneumonia. Moderate right pleural effusion. Emphysema. Critical Value/emergent results were called by telephone at the time of interpretation on 02/04/2016 at 12:30 pm to Douglas Mercury, RN, who verbally acknowledged these results. Electronically Signed   By: Margarette Canada M.D.   On: 02/04/2016 12:31   [place] Art/ven Flow Abd Pelv  Doppler  02/08/2016  CLINICAL DATA:  Acute liver failure. EXAM: DUPLEX ULTRASOUND OF LIVER TECHNIQUE: Color and duplex Doppler  ultrasound was performed to evaluate the hepatic in-flow and out-flow vessels. COMPARISON:  None. FINDINGS: Portal Vein Velocities Main:  27 cm/sec Right:  25.8 cm/sec Left:  18 cm/sec Normal hepatopetal flow is noted in the portal veins. Hepatic Vein Velocities Right:  51.4 cm/sec Middle:  47.3 cm/sec Left:  67.2 cm/sec Normal hepatofugal flow is noted in hepatic veins. Hepatic Artery Velocity:  73.4 cm/sec Splenic Vein Velocity:  30.9 cm/sec Varices: Absent. Ascites: Absent. Spleen measures 9.2 x 6.9 x 3.3 cm in size. IMPRESSION: No evidence of portal, hepatic or splenic venous thrombosis or occlusion. Electronically Signed   By: Marijo Conception, M.D.   On: 02/08/2016 15:17   Dg Chest Port 1 View  02/10/2016  CLINICAL DATA:  Shortness of breath. Unresponsive. History of hypertension, diabetes, prostate cancer, former smoker. EXAM: PORTABLE CHEST 1 VIEW COMPARISON:  02/03/2016 FINDINGS: Examination is limited due to patient rotation. Postoperative changes in the mediastinum. Cardiac enlargement without significant vascular congestion. Persistent infiltration and consolidation in the right lung with progression since previous study, likely pneumonia. Small right pleural effusion is developing. Left lung is clear and expanded. Calcified and tortuous aorta. IMPRESSION: Interval progression of right lung consolidation and infiltration as well as right pleural effusion. Electronically Signed   By: Lucienne Capers M.D.   On: 02/10/2016 05:58   US Abdomen Limited Ruq  02/08/2016  CLINICAL DATA:  Acute liver failure. Inpatient. Elevated liver function tests. Prostate cancer. EXAM: US ABDOMEN LIMITED - RIGHT UPPER QUADRANT COMPARISON:  01/17/2006 CT abdomen/pelvis. FINDINGS: Gallbladder: No gallstones or wall thickening visualized. No sonographic Murphy sign noted by sonographer. Common bile duct: Diameter: 4 mm Liver: No focal lesion identified. Within normal limits in parenchymal echogenicity. Incidentally noted is  a small right pleural effusion. IMPRESSION: 1. Incidental small right pleural effusion. 2. Otherwise normal right upper quadrant abdominal sonogram, with no cholelithiasis, no biliary ductal dilatation and sonographically normal liver. Electronically Signed   By: Ilona Sorrel M.D.   On: 02/08/2016 15:14    Micro Results   Recent Results (from the past 240 hour(s))  MRSA PCR Screening     Status: None   Collection Time: 02/03/16  6:53 PM  Result Value Ref Range Status   MRSA by PCR NEGATIVE NEGATIVE Final    Comment:        The GeneXpert MRSA Assay (FDA approved for NASAL specimens only), is one component of a comprehensive MRSA colonization surveillance program. It is not intended to diagnose MRSA infection nor to guide or monitor treatment for MRSA infections.   Respiratory virus panel     Status: None   Collection Time: 02/05/16  9:13 AM  Result Value Ref Range Status   Respiratory Syncytial Virus A Negative Negative Final   Respiratory Syncytial Virus B Negative Negative Final   Influenza A Negative Negative Final   Influenza B Negative Negative Final   Parainfluenza 1 Negative Negative Final   Parainfluenza 2 Negative Negative Final   Parainfluenza 3 Negative Negative Final   Metapneumovirus Negative Negative Final   Rhinovirus Negative Negative Final   Adenovirus Negative Negative Final    Comment: (NOTE) Performed At: [facility] [address] [place], [place] [id number] Lindon Romp MD [id number]        Today   Subjective:   Douglas Carrillo denies any complaints..   Objective:   Blood pressure  135/67, pulse 93, temperature 98.4 F (36.9 C), temperature source Axillary, resp. rate 25, height 6' (1.829 m), weight 77.6 kg (171 lb 1.2 oz), SpO2 93 %.   Intake/Output Summary (Last 24 hours) at 01/27/2016 1319 Last data filed at 02/14/2016 1000  Gross per 24 hour  Intake    613 ml  Output    725 ml  Net   -112 ml    Exam   Data Review    CBC w Diff: Lab Results  Component Value Date   WBC 7.8 02/05/2016   HGB 8.9* 02/19/2016   HCT 28.8* 01/26/2016   PLT 403* 02/17/2016   LYMPHOPCT 12 02/05/2016   MONOPCT 6 02/05/2016   EOSPCT 0 02/05/2016   BASOPCT 0 02/05/2016    CMP: Lab Results  Component Value Date   NA 136 02/05/2016   K 3.9 02/12/2016   CL 101 02/03/2016   CO2 24 02/14/2016   BUN 49* 01/29/2016   CREATININE 1.24 01/28/2016   CREATININE 1.36* 12/11/2015   PROT 7.0 02/12/2016   ALBUMIN 2.3* 02/12/2016   BILITOT 0.7 02/12/2016   ALKPHOS 263* 02/12/2016   AST 32 02/12/2016   ALT 90* 02/12/2016  .   Total Time in preparing paper work, data evaluation and todays exam -  40 minutes  Demitra Danley M.D on 02/01/2016 at 1:19 PM  [place]  [phone]

## 2016-02-13 NOTE — Progress Notes (Signed)
Inpatient Diabetes Program Recommendations  AACE/ADA: New Consensus Statement on Inpatient Glycemic Control (2015)  Target Ranges:  Prepandial:   less than 140 mg/dL      Peak postprandial:   less than 180 mg/dL (1-2 hours)      Critically ill patients:  140 - 180 mg/dL   Review of Glycemic Control Results for AHSIR, SIMA (MRN [id number]) as of 01/29/2016 09:39  Ref. Range 02/12/2016 07:50 02/12/2016 12:24 02/12/2016 15:31 02/12/2016 21:37 02/03/2016 08:24  Glucose-Capillary Latest Ref Range: 65-99 mg/dL 290 (H) 313 (H) 282 (H) 285 (H) 287 (H)   Blood sugars in 200-300s.   Inpatient Diabetes Program Recommendations:    Increase Lantus to 30 units QD Add Novolog 4 units tidwc for meal coverage insulin  Will continue to follow. Thank you. Lorenda Peck, RD, LDN, CDE Inpatient Diabetes Coordinator [phone]

## 2016-02-13 NOTE — Progress Notes (Addendum)
[place] for IV heparin Indication: VTE Treatment  Allergies  Allergen Reactions  . Mold Extract [Trichophyton] Anaphylaxis  . Lipitor [Atorvastatin] Other (See Comments)    Weakness in legs/myalgias    Patient Measurements: Height: 6' (182.9 cm) Weight: 171 lb 1.2 oz (77.6 kg) IBW/kg (Calculated) : 77.6 Heparin Dosing Weight: 82.7kg  Vital Signs: Temp: 98 F (36.7 C) (02/21 0400) Temp Source: Axillary (02/21 0400) BP: 153/76 mmHg (02/21 0600) Pulse Rate: 93 (02/21 0000)  Labs:  Recent Labs  02/11/16 0329 02/12/16 0311 02/12/2016 0312  HGB 8.8* 9.2* 8.9*  HCT 27.9* 29.7* 28.8*  PLT 317 411* 403*  LABPROT  --  20.4*  --   INR  --  1.75*  --   HEPARINUNFRC 0.45 0.39 0.49  CREATININE 1.26* 1.32* 1.24    Estimated Creatinine Clearance: 47.8 mL/min (by C-G formula based on Cr of 1.24).  Assessment: 18 yoM with COPD on home 02, CAD, CKD, HTN, severe aortic stenosis and recent hospitalization for staph bacteremia and HCAP presents 2/11 with SOB and cough found to have recurrent right-sided PNA and probably parapneumonic effusion with elevated LA and tachycardia, started on antibiotics.  Dopplers now positive for DVT in peroneal veins, left distal popliteal vein, left soleal veins, and left peroneal veins.  VQ scan reveals BL PE (categorized as at least submassive with R heart strain noted). Pharmacy consulted to start IV heparin for VTE treatment.   PTA on plavix (?not aspirin) though pt reports not taking medication.  Resumed inpatient. Stopped 2/14 by TRH.  On 2/17 Pharmacy is also consulted to dose warfarin -> warfarin dc'd 2/18.  Today, 02/14/2016:  Heparin level in target range (0.49) on 1300units/hr.  Hgb slightly decreased.  No bleeding reported/documented. Aspirin 81mg  daily. MD progress note indicates to resume warfarin - no orders  Goal of Therapy:  Heparin level 0.3-0.7 units/ml Monitor platelets by anticoagulation  protocol: Yes   Plan:   Cont heparin at 1300units/hr.  Daily heparin level and CBC.   Resume warfarin?  Peggyann Juba, PharmD, BCPS Pager: [phone] 02/14/2016,7:24 AM.    Addendum: Order received to begin warfarin per pharmacy protocol. Patient did receive one dose of 2.5mg  on 2/17, then warfarin was dc'd 2/18. Most recent INR = 1.75 on 2/20. Given age > 65, will begin with conservative dosing.  Plan: - Warfarin 2.5mg  PO today x 1 - Daily PT/INR - Monitor closely for signs of bleeding - Provide warfarin education prior to discharge  Peggyann Juba, PharmD, BCPS 02/16/2016 8:26 AM

## 2016-02-13 NOTE — Progress Notes (Signed)
Key Points: Use following P&T approved IV to PO antibiotic change policy.  Description contains the criteria that are approved Note: Policy Excludes:  Esophagectomy patientsPHARMACIST - PHYSICIAN COMMUNICATION DR:   [place]ju[place] Letters CONCERNING: IV to Oral Route Change Policy  RECOMMENDATION: This patient is receiving Protonix by the intravenous route.  Based on criteria approved by the Pharmacy and Therapeutics Committee, the intravenous medication(s) is/are being converted to the equivalent oral dose form(s).   DESCRIPTION: These criteria include:  The patient is eating (either orally or via tube) and/or has been taking other orally administered medications for a least 24 hours  The patient has no evidence of active gastrointestinal bleeding or impaired GI absorption (gastrectomy, short bowel, patient on TNA or NPO).  If you have questions about this conversion, please contact the Pharmacy Department  []   [phone] )  [facility] []   [phone] )  [facility] []   [phone] )  [facility] []   [phone] )  [facility] [x]   [phone] )  [place], PharmD, [place] Pager: [phone] 01/26/2016 1:35 PM

## 2016-02-14 ENCOUNTER — Other Ambulatory Visit (HOSPITAL_COMMUNITY): Payer: Self-pay

## 2016-02-14 LAB — MAGNESIUM: Magnesium: 2 mg/dL (ref 1.7–2.4)

## 2016-02-14 LAB — CBC
HEMATOCRIT: 33 % — AB (ref 39.0–52.0)
Hemoglobin: 10.2 g/dL — ABNORMAL LOW (ref 13.0–17.0)
MCH: 27.8 pg (ref 26.0–34.0)
MCHC: 30.9 g/dL (ref 30.0–36.0)
MCV: 89.9 fL (ref 78.0–100.0)
Platelets: 413 10*3/uL — ABNORMAL HIGH (ref 150–400)
RBC: 3.67 MIL/uL — AB (ref 4.22–5.81)
RDW: 20.3 % — ABNORMAL HIGH (ref 11.5–15.5)
WBC: 8.9 10*3/uL (ref 4.0–10.5)

## 2016-02-14 LAB — BASIC METABOLIC PANEL
ANION GAP: 10 (ref 5–15)
BUN: 34 mg/dL — ABNORMAL HIGH (ref 6–20)
CO2: 25 mmol/L (ref 22–32)
Calcium: 9 mg/dL (ref 8.9–10.3)
Chloride: 106 mmol/L (ref 101–111)
Creatinine, Ser: 1.12 mg/dL (ref 0.61–1.24)
GFR, EST NON AFRICAN AMERICAN: 58 mL/min — AB (ref >60)
GLUCOSE: 124 mg/dL — AB (ref 65–99)
POTASSIUM: 3.3 mmol/L — AB (ref 3.5–5.1)
Sodium: 141 mmol/L (ref 135–145)

## 2016-02-14 LAB — PROTIME-INR
INR: 1.61 — AB (ref 0.00–1.49)
PROTHROMBIN TIME: 19.2 s — AB (ref 11.6–15.2)

## 2016-02-14 LAB — PHOSPHORUS: PHOSPHORUS: 3.1 mg/dL (ref 2.5–4.6)

## 2016-02-14 LAB — HEPARIN LEVEL (UNFRACTIONATED)
HEPARIN UNFRACTIONATED: 0.29 [IU]/mL — AB (ref 0.30–0.70)
Heparin Unfractionated: 0.25 IU/mL — ABNORMAL LOW (ref 0.30–0.70)

## 2016-02-14 LAB — VANCOMYCIN, TROUGH: VANCOMYCIN TR: 11 ug/mL (ref 10.0–20.0)

## 2016-02-15 ENCOUNTER — Ambulatory Visit: Payer: Medicare Other | Admitting: Cardiovascular Disease

## 2016-02-15 MED FILL — Medication: Qty: 1 | Status: AC

## 2016-02-21 DEATH — deceased

## 2016-09-16 IMAGING — CR DG CHEST 2V
2 series · 2 of 2 positions shown · non-contrast
Comparison: 12/13/2015

CLINICAL DATA: Wheezing, cough, congestion for 1 week

EXAM:
CHEST  2 VIEW

[w chest pa]
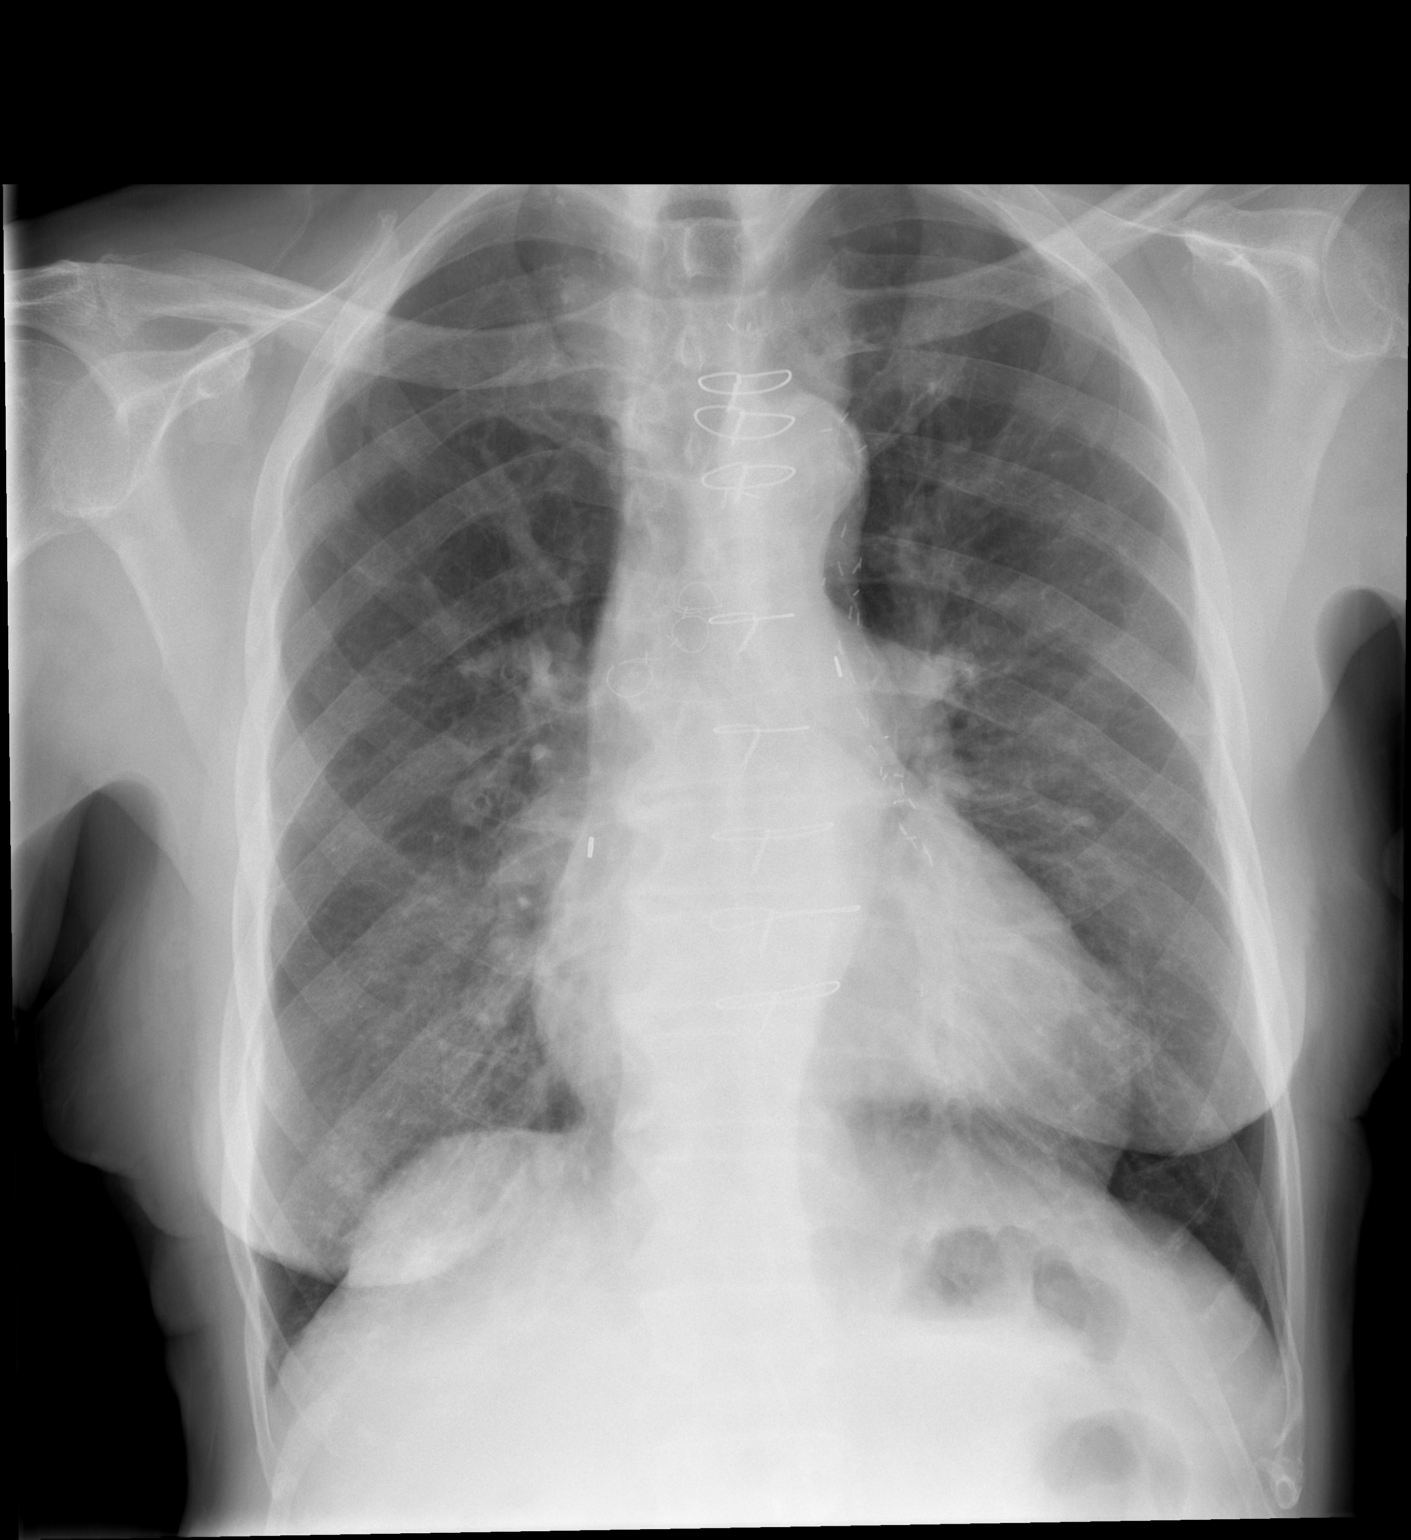

[w chest lat]
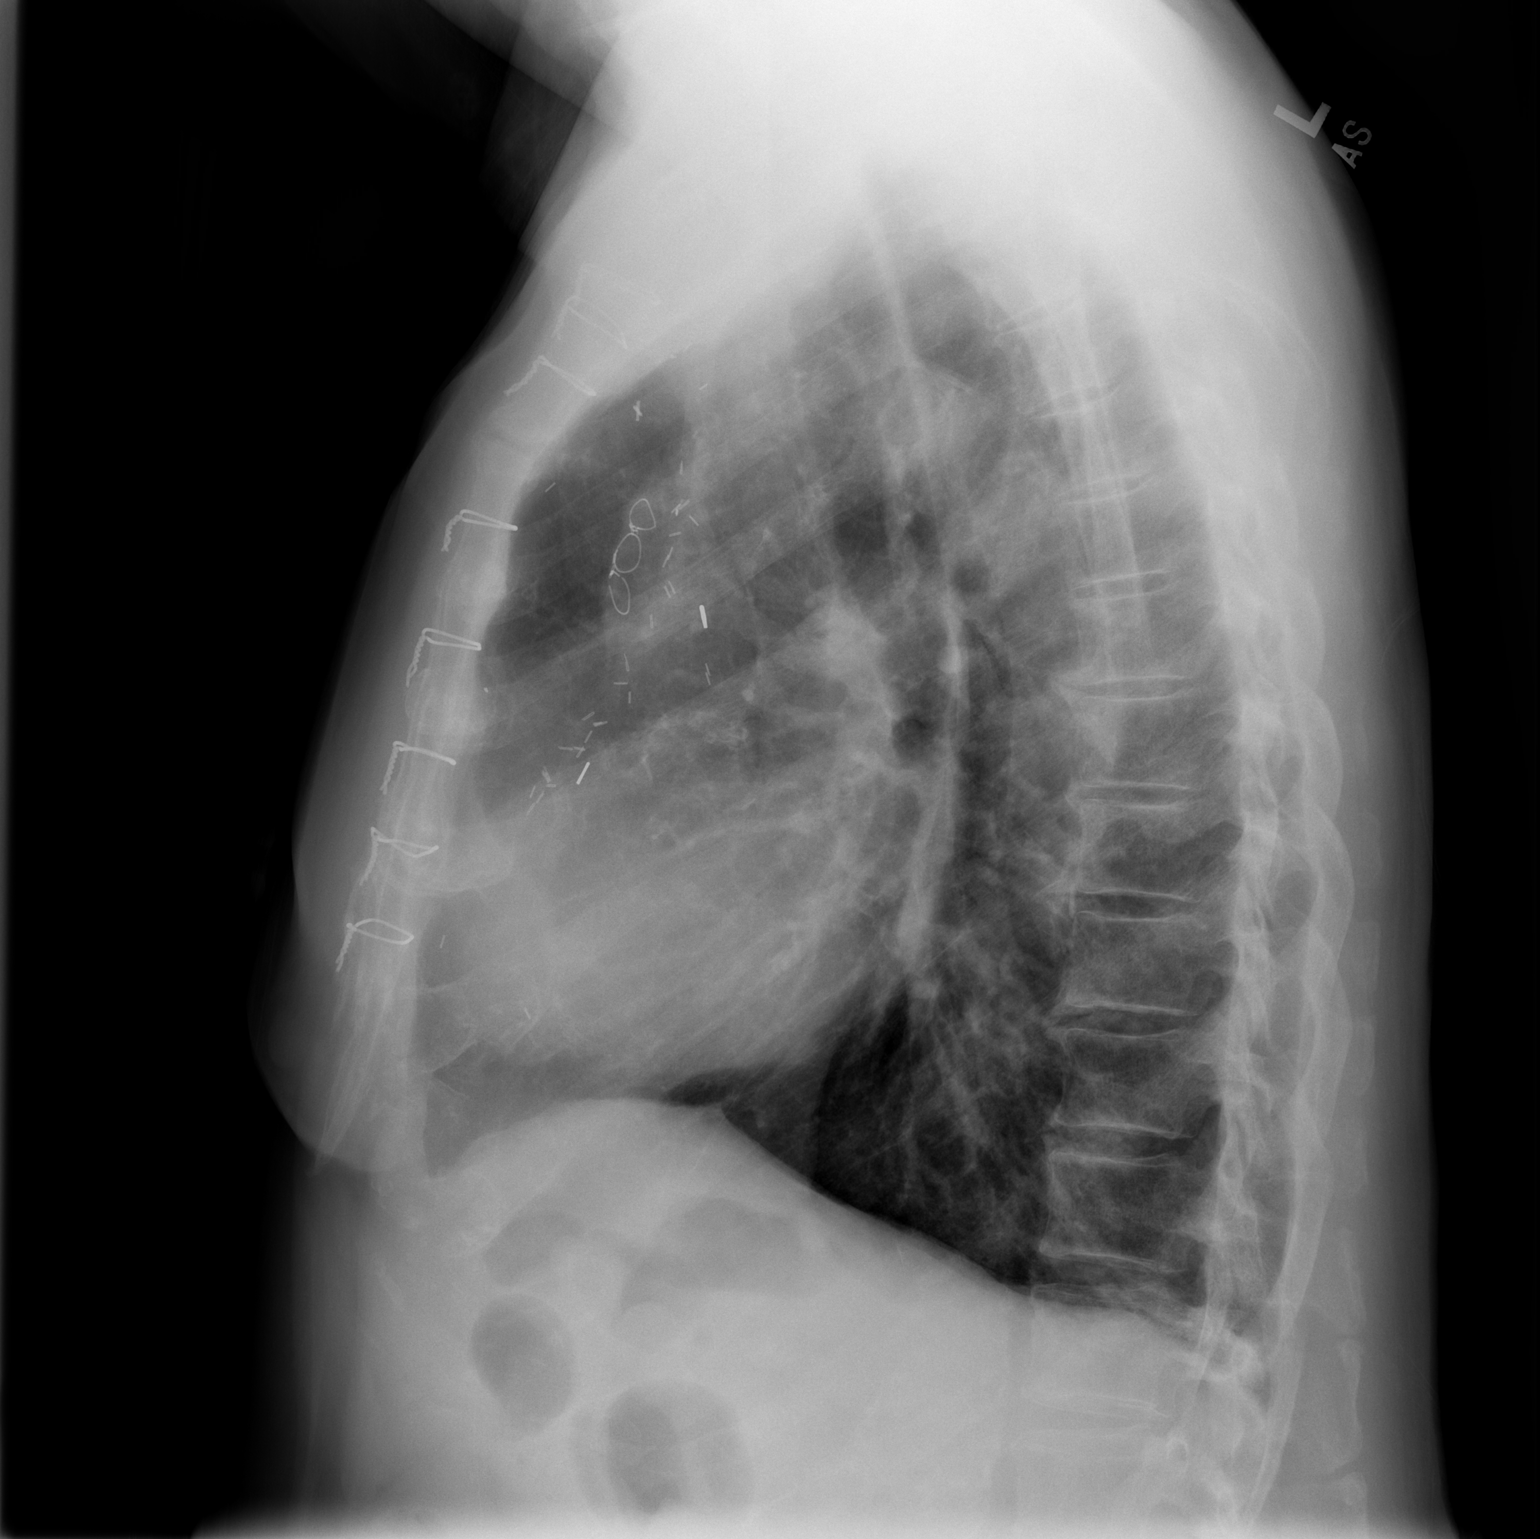

[2 of 2 positions shown; findings below may reference images not displayed]

FINDINGS: Cardiomediastinal silhouette is stable. Central mild vascular
congestion without convincing pulmonary edema. Status post CABG.
Mild infrahilar bronchitic changes. No segmental infiltrate. Mild
hyperinflation.
IMPRESSION: Central mild vascular congestion without convincing pulmonary edema.
Mild infrahilar bronchitic changes. Status post CABG. Mild
hyperinflation.

## 2016-09-21 IMAGING — CR DG CHEST 2V
2 series · 2 of 2 positions shown · non-contrast
Comparison: January 04, 2016.

CLINICAL DATA: Cough and shortness of breath for a week.

EXAM:
CHEST  2 VIEW

[chest lat]
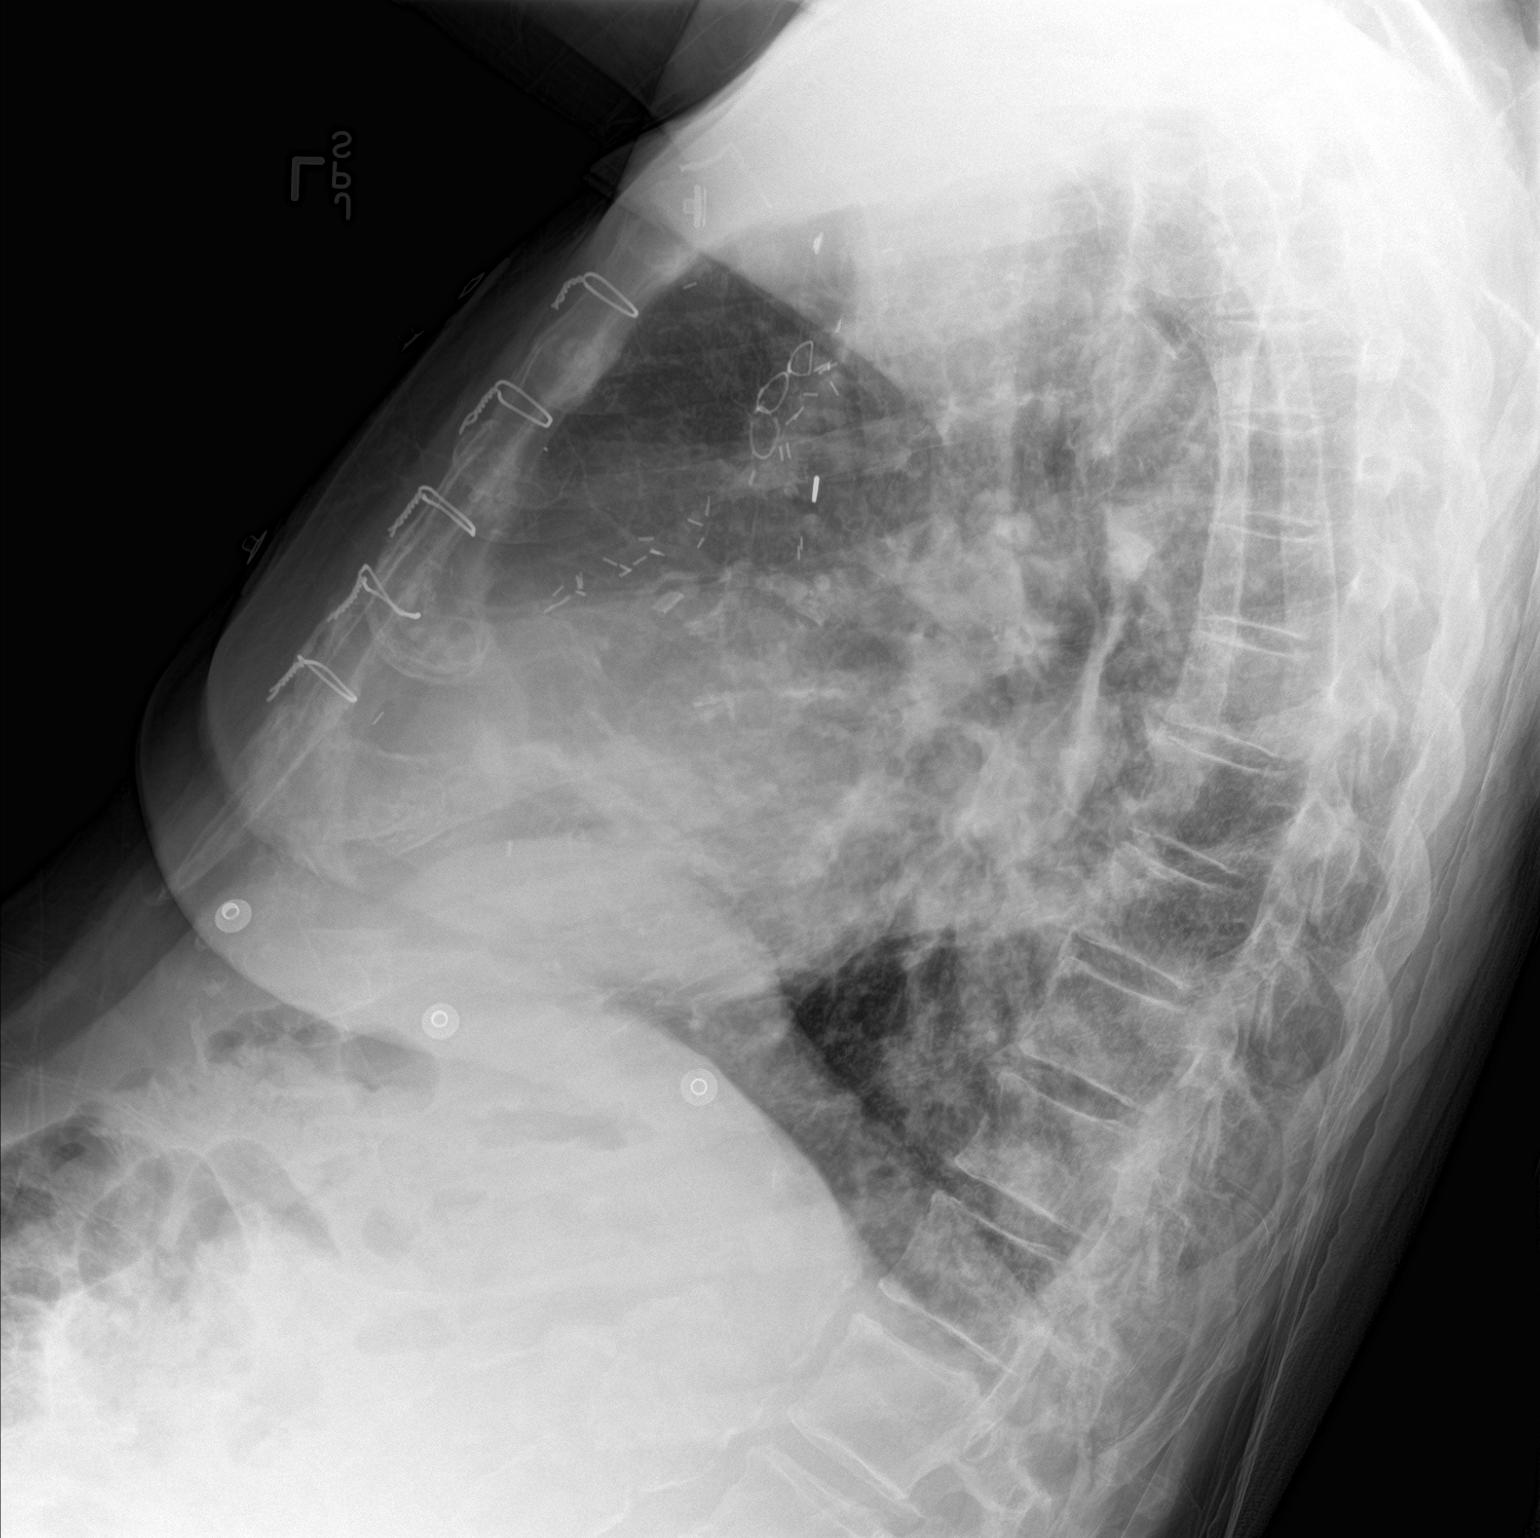

[chest ap]
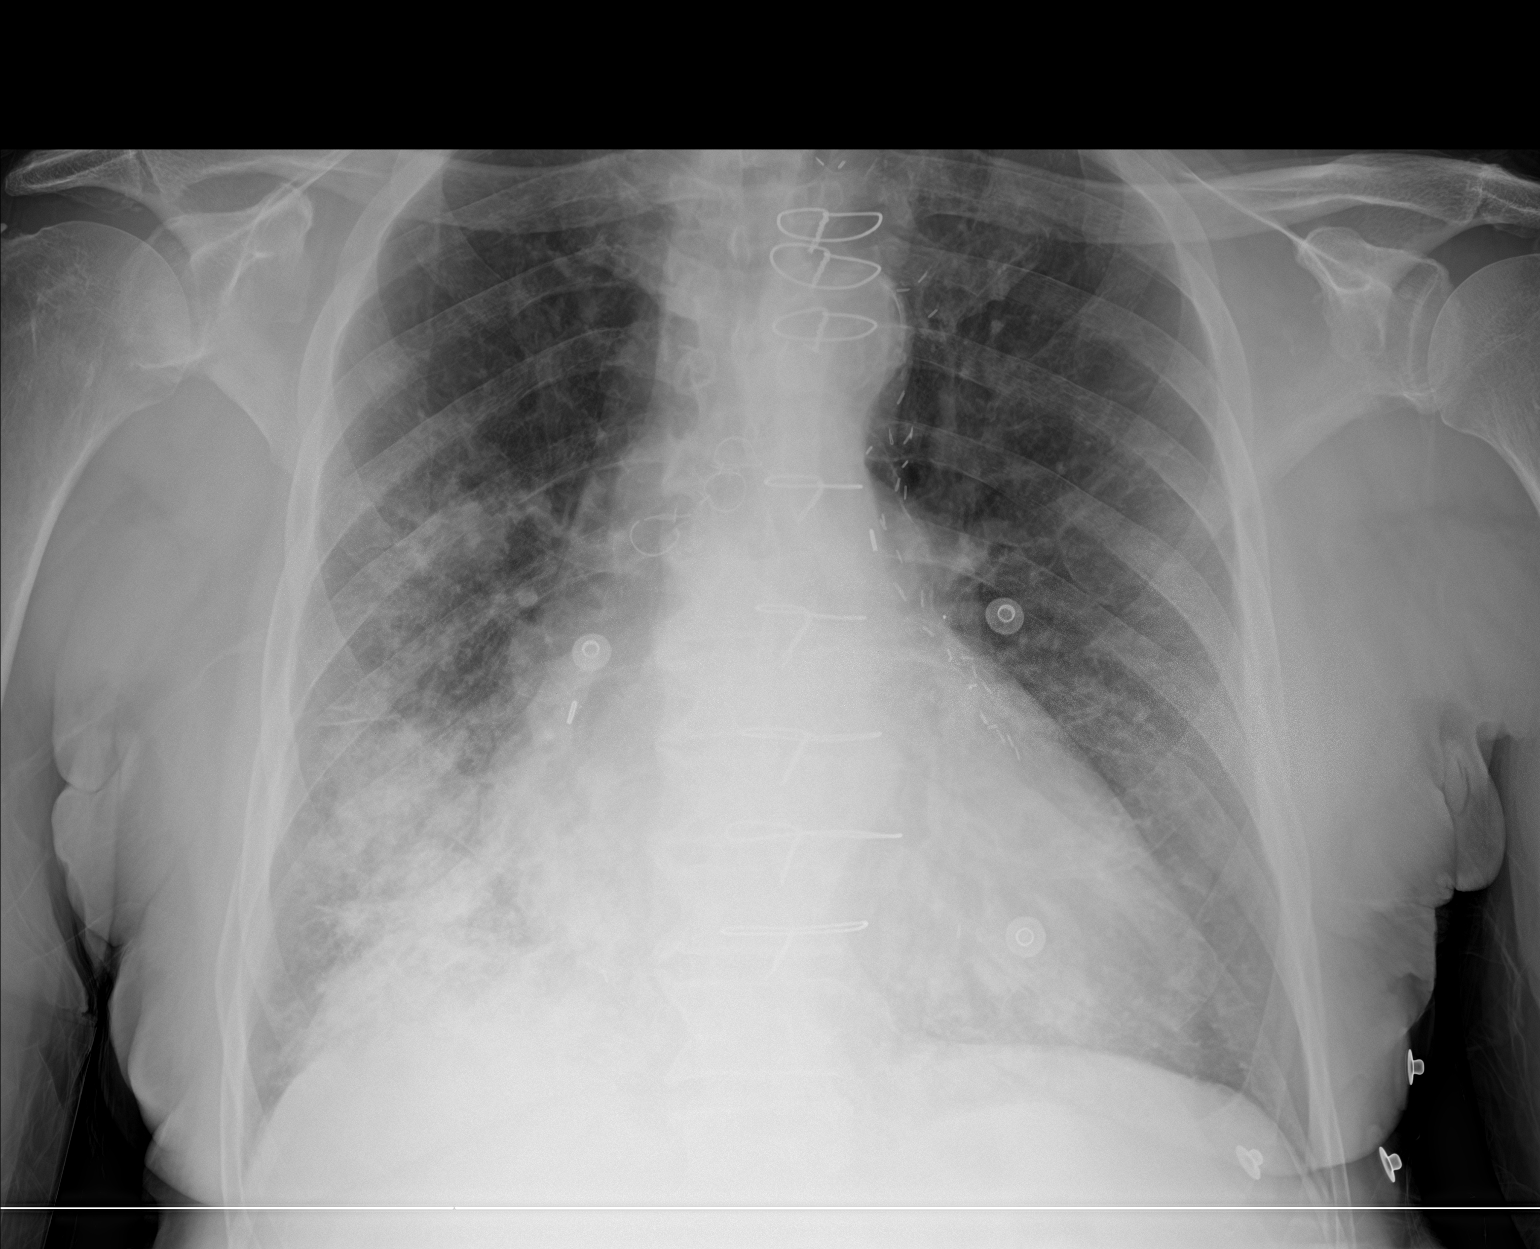

[2 of 2 positions shown; findings below may reference images not displayed]

FINDINGS: Stable cardiomegaly. Status post coronary artery bypass graft. No
pneumothorax or significant pleural effusion is noted. There is
interval development of right middle and lower lobe airspace
opacities concerning for pneumonia. Left lung is clear. Bony thorax
is unremarkable.
IMPRESSION: Findings consistent with right middle and lower lobe pneumonia.
Continued radiographic follow-up is recommended to ensure
resolution.

## 2016-10-27 IMAGING — DX DG CHEST 1V PORT
1 series · 1 of 1 positions shown · non-contrast
Comparison: Single view of the chest 02/10/2016 and CT chest
02/04/2016.

CLINICAL DATA: Status post right PICC placement. Initial encounter.

EXAM:
PORTABLE CHEST 1 VIEW

[chest ap]
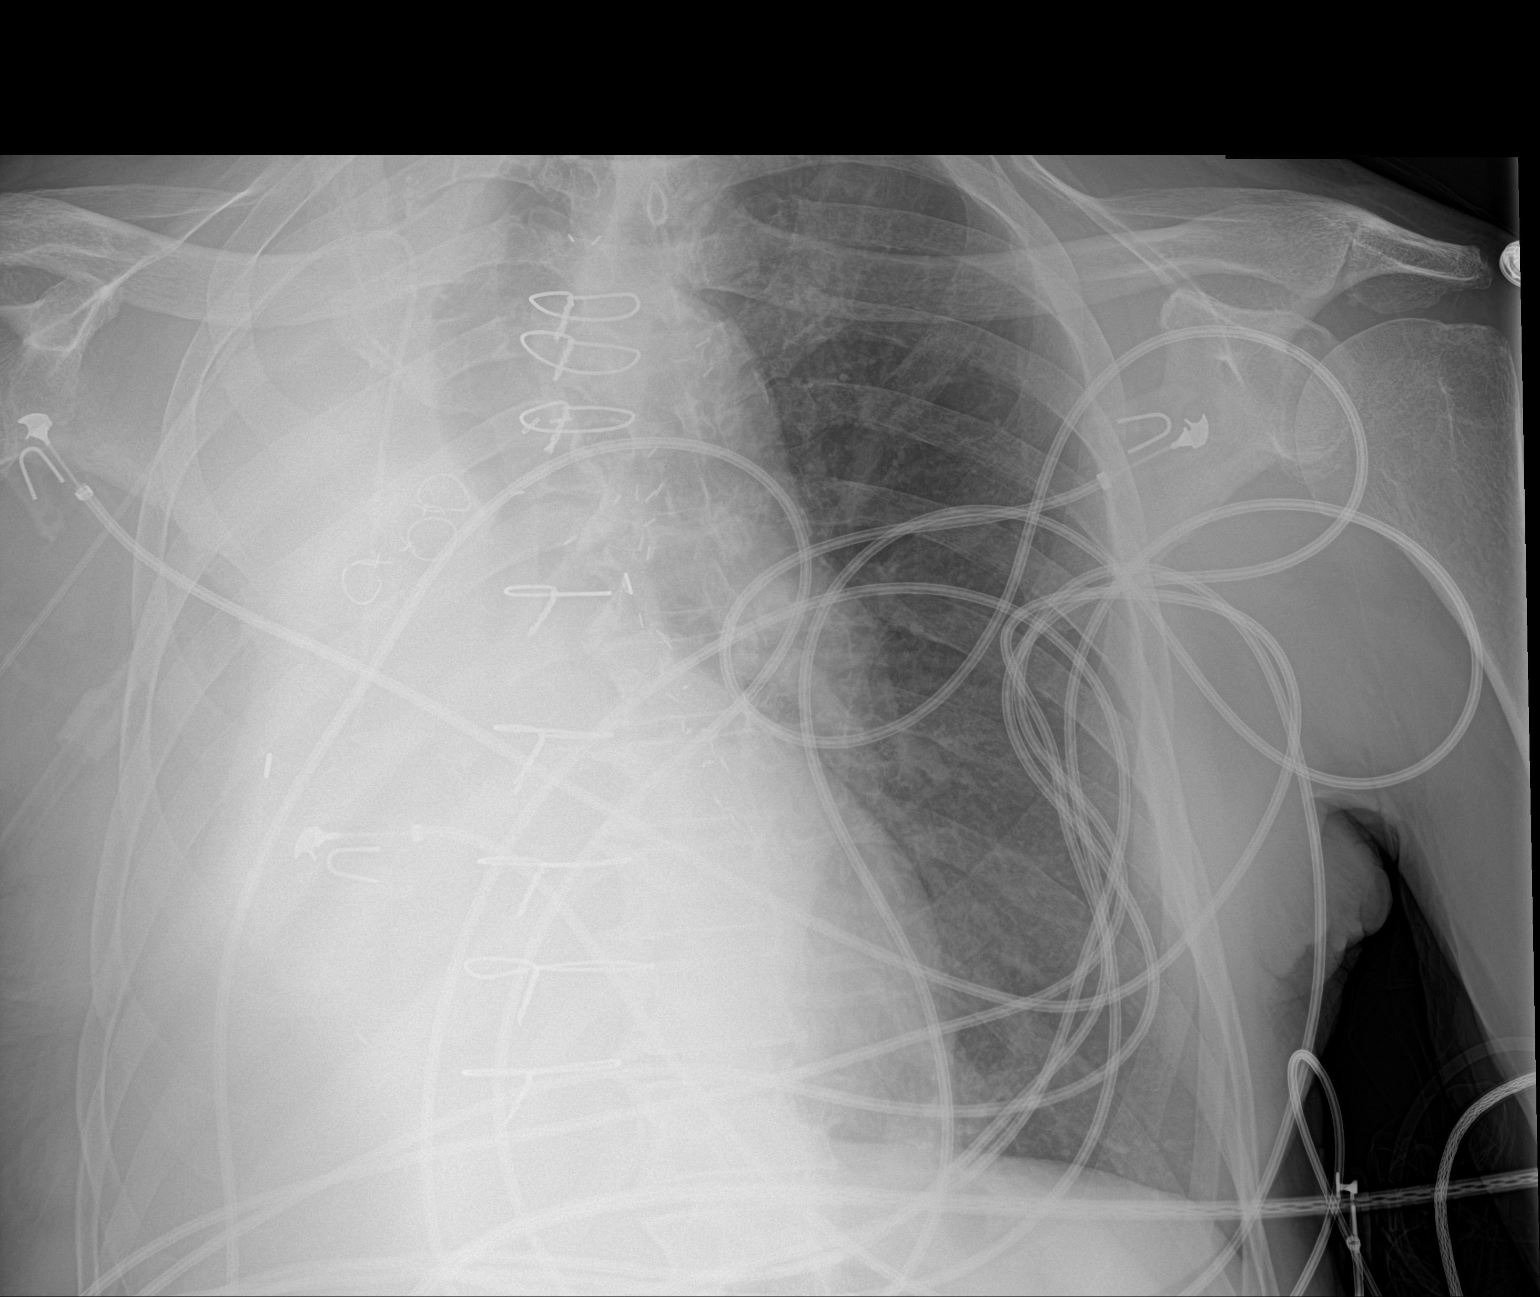

[1 of 1 positions shown; findings below may reference images not displayed]

FINDINGS: Right PICC is in place with the tip projecting over the lower
superior vena cava. There has been marked worsening in the patient's
right pleural effusion and airspace disease. The right chest is
completely Norhamdani Norhamdani out. The left lung is clear. Heart size is normal.
The patient is rotated on the exam.
IMPRESSION: Right hip scratch the tip of right PICC projects over the lower
superior vena cava.

Complete whiteout of the right chest consistent with worsened
airspace disease and effusion.
